# Patient Record
Sex: Male | Born: 1940 | Race: White | Hispanic: No | Marital: Married | Smoking: Former smoker
Health system: Southern US, Community
[De-identification: ages and names within clinical notes are randomized; demographics above are authoritative.]

## PROBLEM LIST (undated history)

## (undated) ENCOUNTER — Encounter: Attending: Pharmacist | Primary: Pharmacist

## (undated) ENCOUNTER — Ambulatory Visit

## (undated) ENCOUNTER — Encounter

## (undated) ENCOUNTER — Encounter: Attending: Cardiovascular Disease | Primary: Cardiovascular Disease

## (undated) ENCOUNTER — Encounter: Attending: Clinical Cardiac Electrophysiology | Primary: Clinical Cardiac Electrophysiology

## (undated) ENCOUNTER — Encounter: Payer: MEDICARE | Attending: Cardiovascular Disease | Primary: Cardiovascular Disease

## (undated) ENCOUNTER — Encounter: Attending: Nurse Practitioner | Primary: Nurse Practitioner

## (undated) ENCOUNTER — Encounter: Attending: Internal Medicine | Primary: Internal Medicine

## (undated) ENCOUNTER — Telehealth

## (undated) ENCOUNTER — Ambulatory Visit: Payer: MEDICARE

## (undated) ENCOUNTER — Ambulatory Visit: Payer: Medicare (Managed Care) | Attending: Nurse Practitioner | Primary: Nurse Practitioner

## (undated) ENCOUNTER — Encounter: Payer: MEDICARE | Attending: Pharmacist | Primary: Pharmacist

## (undated) ENCOUNTER — Ambulatory Visit: Payer: MEDICARE | Attending: Pharmacist | Primary: Pharmacist

## (undated) ENCOUNTER — Inpatient Hospital Stay

## (undated) ENCOUNTER — Non-Acute Institutional Stay: Payer: MEDICARE

## (undated) ENCOUNTER — Ambulatory Visit: Payer: MEDICARE | Attending: Cardiovascular Disease | Primary: Cardiovascular Disease

## (undated) ENCOUNTER — Telehealth: Attending: Pharmacist | Primary: Pharmacist

## (undated) ENCOUNTER — Ambulatory Visit: Payer: Medicare (Managed Care)

## (undated) ENCOUNTER — Telehealth: Attending: Nurse Practitioner | Primary: Nurse Practitioner

## (undated) ENCOUNTER — Telehealth
Attending: Student in an Organized Health Care Education/Training Program | Primary: Student in an Organized Health Care Education/Training Program

## (undated) DIAGNOSIS — M1990 Unspecified osteoarthritis, unspecified site: Secondary | ICD-10-CM

## (undated) DIAGNOSIS — I509 Heart failure, unspecified: Secondary | ICD-10-CM

## (undated) DIAGNOSIS — G930 Cerebral cysts: Secondary | ICD-10-CM

## (undated) DIAGNOSIS — H3530 Unspecified macular degeneration: Secondary | ICD-10-CM

## (undated) DIAGNOSIS — I255 Ischemic cardiomyopathy: Secondary | ICD-10-CM

## (undated) DIAGNOSIS — I2510 Atherosclerotic heart disease of native coronary artery without angina pectoris: Secondary | ICD-10-CM

## (undated) DIAGNOSIS — I499 Cardiac arrhythmia, unspecified: Secondary | ICD-10-CM

## (undated) DIAGNOSIS — E119 Type 2 diabetes mellitus without complications: Secondary | ICD-10-CM

## (undated) DIAGNOSIS — I10 Essential (primary) hypertension: Secondary | ICD-10-CM

## (undated) DIAGNOSIS — E785 Hyperlipidemia, unspecified: Secondary | ICD-10-CM

## (undated) DIAGNOSIS — IMO0001 Reserved for inherently not codable concepts without codable children: Secondary | ICD-10-CM

## (undated) HISTORY — DX: Unspecified macular degeneration: H35.30

## (undated) HISTORY — DX: Type 2 diabetes mellitus without complications: E11.9

## (undated) HISTORY — DX: Essential (primary) hypertension: I10

## (undated) HISTORY — DX: Ischemic cardiomyopathy: I25.5

## (undated) HISTORY — DX: Cerebral cysts: G93.0

## (undated) HISTORY — DX: Hyperlipidemia, unspecified: E78.5

## (undated) HISTORY — PX: EYE SURGERY: SHX253

## (undated) HISTORY — DX: Heart failure, unspecified: I50.9

## (undated) HISTORY — PX: CARDIAC CATHETERIZATION: SHX172

## (undated) HISTORY — PX: OTHER SURGICAL HISTORY: SHX169

## (undated) MED ORDER — SPIRONOLACTONE 25 MG TABLET: Freq: Every day | ORAL | 0 days

## (undated) MED ORDER — CARVEDILOL 25 MG TABLET
Freq: Two times a day (BID) | ORAL | 0.00000 days
Start: ? — End: 2021-05-21

---

## 1981-12-13 DIAGNOSIS — G930 Cerebral cysts: Secondary | ICD-10-CM

## 1981-12-13 HISTORY — DX: Cerebral cysts: G93.0

## 1981-12-13 HISTORY — PX: OTHER SURGICAL HISTORY: SHX169

## 1995-12-14 HISTORY — PX: APPENDECTOMY: SHX54

## 2003-12-14 HISTORY — PX: COLONOSCOPY: SHX174

## 2004-02-14 ENCOUNTER — Other Ambulatory Visit: Payer: Self-pay

## 2004-09-25 ENCOUNTER — Ambulatory Visit: Payer: Self-pay | Admitting: Unknown Physician Specialty

## 2007-03-31 ENCOUNTER — Ambulatory Visit: Payer: Self-pay | Admitting: Ophthalmology

## 2007-04-10 ENCOUNTER — Other Ambulatory Visit: Payer: Self-pay

## 2007-04-10 ENCOUNTER — Ambulatory Visit: Payer: Self-pay | Admitting: Ophthalmology

## 2008-12-02 ENCOUNTER — Ambulatory Visit: Payer: Self-pay | Admitting: General Surgery

## 2009-07-02 ENCOUNTER — Encounter: Payer: Self-pay | Admitting: General Practice

## 2009-07-13 ENCOUNTER — Encounter: Payer: Self-pay | Admitting: General Practice

## 2009-08-13 ENCOUNTER — Encounter: Payer: Self-pay | Admitting: General Practice

## 2011-07-09 ENCOUNTER — Ambulatory Visit: Payer: Self-pay | Admitting: Physician Assistant

## 2011-12-14 HISTORY — PX: OTHER SURGICAL HISTORY: SHX169

## 2011-12-20 ENCOUNTER — Ambulatory Visit: Payer: Self-pay | Admitting: Cardiology

## 2013-05-23 DIAGNOSIS — L089 Local infection of the skin and subcutaneous tissue, unspecified: Secondary | ICD-10-CM | POA: Insufficient documentation

## 2013-07-12 ENCOUNTER — Encounter: Payer: Self-pay | Admitting: *Deleted

## 2013-08-06 ENCOUNTER — Ambulatory Visit (INDEPENDENT_AMBULATORY_CARE_PROVIDER_SITE_OTHER): Payer: Medicare Other | Admitting: General Surgery

## 2013-08-06 ENCOUNTER — Encounter: Payer: Self-pay | Admitting: General Surgery

## 2013-08-06 VITALS — BP 130/78 | HR 77 | Resp 14 | Ht 75.0 in | Wt 230.0 lb

## 2013-08-06 DIAGNOSIS — L723 Sebaceous cyst: Secondary | ICD-10-CM

## 2013-08-06 DIAGNOSIS — L089 Local infection of the skin and subcutaneous tissue, unspecified: Secondary | ICD-10-CM

## 2013-08-06 HISTORY — PX: CYST EXCISION: SHX5701

## 2013-08-06 NOTE — Patient Instructions (Addendum)
Ice pack applied. Written instructions provided to patient regarding wound care.  Patient advised that she will be contacted by phone when the pathology report is available.  Follow up appointment has been scheduled with the nurse in 7-10 days.

## 2013-08-06 NOTE — Progress Notes (Signed)
Patient ID: Daniel Luna, male   DOB: 12/10/1941, 72 y.o.   MRN: [id number]  Chief Complaint  Patient presents with  . Cyst    HPI Daniel Luna is a 72 y.o. male  Here for evaluation of cyst on back and neck. He has had these for about 5 years. Recently the cyst on his left back got infected and was draining. He was placed on antibiotics for this, and underwent an I&D x2. Area has been draining intermittently since the drainage procedure in June 2014. The cyst on his neck his doctor advised he have removed, but the patient has never had any symptoms from this area, nor does he report any increase in size.   HPI  Past Medical History  Diagnosis Date  . Hypertension   . Hyperlipidemia   . Diabetes mellitus without complication   . Heart failure   . Cyst of brain 1983    Past Surgical History  Procedure Laterality Date  . Appendectomy  1997  . Cyst removal brain  1983    benign dermatoid cyst  . Colonoscopy  2005    4 polyps  . Eye surgery Bilateral 2008, 2012  . Stents  2013    3 placed    Family History  Problem Relation Age of Onset  . Throat cancer Mother     Social History History  Substance Use Topics  . Smoking status: Former Smoker    Types: Cigarettes  . Smokeless tobacco: Never Used  . Alcohol Use: Yes    Allergies  Allergen Reactions  . Iodinated Diagnostic Agents Hives    Current Outpatient Prescriptions  Medication Sig Dispense Refill  . ACCU-CHEK FASTCLIX LANCETS MISC       . ACCU-CHEK SMARTVIEW test strip       . aspirin 81 MG tablet Take 81 mg by mouth daily.      . carvedilol (COREG) 12.5 MG tablet Take 12.5 mg by mouth 2 (two) times daily with a meal.      . furosemide (LASIX) 20 MG tablet Take 20 mg by mouth.      Marland Kitchen lisinopril (PRINIVIL,ZESTRIL) 40 MG tablet Take 40 mg by mouth daily.      . metFORMIN (GLUCOPHAGE) 1000 MG tablet Take 1,000 mg by mouth 2 (two) times daily with a meal.      . potassium chloride SA (K-DUR,KLOR-CON) 20 MEQ tablet Take  1 tablet by mouth daily.      . prasugrel (EFFIENT) 10 MG TABS tablet Take 10 mg by mouth daily.      . simvastatin (ZOCOR) 80 MG tablet Take 80 mg by mouth at bedtime.       No current facility-administered medications for this visit.    Review of Systems Review of Systems  Constitutional: Negative.   Respiratory: Negative.   Cardiovascular: Negative.     Blood pressure 130/78, pulse 77, resp. rate 14, height 6\' 3"  (1.905 m), weight 230 lb (104.327 kg).  Physical Exam Physical Exam  Constitutional: He is oriented to person, place, and time. He appears well-developed and well-nourished.  Neurological: He is alert and oriented to person, place, and time.  Skin:  Left posterior neck 3.5 cm sebaceous cyst non inflamed. On the left back there is a 1 cm cyst and below it a 1.5 cm area of redness.     Data Reviewed Primary care physician Notes.  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     Plan  It was elected to proceed with excision of the residual cyst wall to minimize ongoing inflammation. 12 cc of 0.5% Xylocaine with 0.25% Marcaine with 1:200,000 units of epinephrine was was well tolerated. DuraPrep was applied to the skin. A vertical incision was made to include the central pore of the sebaceous cyst and the majority of the old incision site. This was completed through an elliptical incision. All the cyst wall was excised. The deep tissue was approximated with a 3-0 Vicryl figure-of-eight suture. The skin was approximated with interrupted 4-0 nylon sutures. A dry dressing with Telfa and Tegaderm was applied.  The patient will return in one week for suture removal to nurse.  The sebaceous cyst of the back is presently asymptomatic. Considering his ongoing anticoagulation the size of lesion would defer intervention unless it increases in size or become symptomatic.       [name] 08/06/2013, 9:21 PM

## 2013-08-09 LAB — PATHOLOGY

## 2013-08-15 ENCOUNTER — Ambulatory Visit (INDEPENDENT_AMBULATORY_CARE_PROVIDER_SITE_OTHER): Payer: Medicare Other | Admitting: General Surgery

## 2013-08-15 ENCOUNTER — Encounter: Payer: Self-pay | Admitting: General Surgery

## 2013-08-15 VITALS — BP 130/64 | Ht 75.0 in | Wt 226.0 lb

## 2013-08-15 DIAGNOSIS — L723 Sebaceous cyst: Secondary | ICD-10-CM

## 2013-08-15 NOTE — Patient Instructions (Addendum)
Patient to return as needed. 

## 2013-08-15 NOTE — Progress Notes (Signed)
Patient ID: Daniel Luna, male   DOB: [birth year], 72 y.o.   MRN: [id number]  Chief Complaint  Patient presents with  . Routine Post Op    HPI Daniel Luna is a 72 y.o. male.  Here today for post op excision posterior back mass done 08-06-13.  States he is doing well. HPI  Past Medical History  Diagnosis Date  . Hypertension   . Hyperlipidemia   . Diabetes mellitus without complication   . Heart failure   . Cyst of brain 1983    Past Surgical History  Procedure Laterality Date  . Appendectomy  1997  . Cyst removal brain  1983    benign dermatoid cyst  . Colonoscopy  2005    4 polyps  . Eye surgery Bilateral 2008, 2012  . Stents  2013    3 placed  . Cyst excision  08-06-13    back    Family History  Problem Relation Age of Onset  . Throat cancer Mother     Social History History  Substance Use Topics  . Smoking status: Former Smoker    Types: Cigarettes  . Smokeless tobacco: Never Used  . Alcohol Use: Yes    Allergies  Allergen Reactions  . Iodinated Diagnostic Agents Hives    Current Outpatient Prescriptions  Medication Sig Dispense Refill  . ACCU-CHEK FASTCLIX LANCETS MISC       . ACCU-CHEK SMARTVIEW test strip       . aspirin 81 MG tablet Take 81 mg by mouth daily.      . carvedilol (COREG) 12.5 MG tablet Take 12.5 mg by mouth 2 (two) times daily with a meal.      . furosemide (LASIX) 20 MG tablet Take 20 mg by mouth.      Marland Kitchen lisinopril (PRINIVIL,ZESTRIL) 40 MG tablet Take 40 mg by mouth daily.      . metFORMIN (GLUCOPHAGE) 1000 MG tablet Take 1,000 mg by mouth 2 (two) times daily with a meal.      . potassium chloride SA (K-DUR,KLOR-CON) 20 MEQ tablet Take 1 tablet by mouth daily.      . prasugrel (EFFIENT) 10 MG TABS tablet Take 10 mg by mouth daily.      . simvastatin (ZOCOR) 80 MG tablet Take 80 mg by mouth at bedtime.       No current facility-administered medications for this visit.    Review of Systems Review of Systems  Constitutional: Negative.    Respiratory: Negative.   Cardiovascular: Negative.     Blood pressure 130/64, height 6\' 3"  (1.905 m), weight 226 lb (102.513 kg).  Physical Exam Physical Exam The excision site shows a 2 cm area of erythema. No fluctuance. No drainage. Sutures were removed without incident.  The noninflamed sebaceous cyst of the back of the neck on the left side is unchanged. Data Reviewed Pathology showed an inflamed sebaceous cyst. No malignancy.  Assessment    Doing well status post excision of persistent sebaceous cyst on the left back.    Plan    The patient was encouraged to call if he appreciate any discomfort or change in the left neck lesion. Follow up otherwise been an as-needed basis       [name] 08/17/2013, 7:40 AM

## 2013-12-20 ENCOUNTER — Ambulatory Visit: Payer: Medicare Other | Admitting: General Surgery

## 2013-12-25 ENCOUNTER — Encounter: Payer: Self-pay | Admitting: General Surgery

## 2013-12-25 ENCOUNTER — Other Ambulatory Visit: Payer: Self-pay | Admitting: General Surgery

## 2013-12-25 ENCOUNTER — Ambulatory Visit (INDEPENDENT_AMBULATORY_CARE_PROVIDER_SITE_OTHER): Payer: Medicare Other | Admitting: General Surgery

## 2013-12-25 VITALS — BP 142/72 | HR 82 | Resp 13 | Ht 75.0 in | Wt 231.0 lb

## 2013-12-25 DIAGNOSIS — Z1211 Encounter for screening for malignant neoplasm of colon: Secondary | ICD-10-CM

## 2013-12-25 MED ORDER — POLYETHYLENE GLYCOL 3350 17 GM/SCOOP PO POWD: ORAL | Status: DC

## 2013-12-25 NOTE — Patient Instructions (Addendum)
Colonoscopy A colonoscopy is an exam to look at the entire large intestine (colon). This exam can help find problems such as tumors, polyps, inflammation, and areas of bleeding. The exam takes about 1 hour.  LET [facility] CARE PROVIDER KNOW ABOUT:   Any allergies you have.  All medicines you are taking, including vitamins, herbs, eye drops, creams, and over-the-counter medicines.  Previous problems you or members of your family have had with the use of anesthetics.  Any blood disorders you have.  Previous surgeries you have had.  Medical conditions you have. RISKS AND COMPLICATIONS  Generally, this is a safe procedure. However, as with any procedure, complications can occur. Possible complications include:  Bleeding.  Tearing or rupture of the colon wall.  Reaction to medicines given during the exam.  Infection (rare). BEFORE THE PROCEDURE   Ask your health care provider about changing or stopping your regular medicines.  You may be prescribed an oral bowel prep. This involves drinking a large amount of medicated liquid, starting the day before your procedure. The liquid will cause you to have multiple loose stools until your stool is almost clear or light green. This cleans out your colon in preparation for the procedure.  Do not eat or drink anything else once you have started the bowel prep, unless your health care provider tells you it is safe to do so.  Arrange for someone to drive you home after the procedure. PROCEDURE   You will be given medicine to help you relax (sedative).  You will lie on your side with your knees bent.  A long, flexible tube with a light and camera on the end (colonoscope) will be inserted through the rectum and into the colon. The camera sends video back to a computer screen as it moves through the colon. The colonoscope also releases carbon dioxide gas to inflate the colon. This helps your health care provider see the area better.  During  the exam, your health care provider may take a small tissue sample (biopsy) to be examined under a microscope if any abnormalities are found.  The exam is finished when the entire colon has been viewed. AFTER THE PROCEDURE   Do not drive for 24 hours after the exam.  You may have a small amount of blood in your stool.  You may pass moderate amounts of gas and have mild abdominal cramping or bloating. This is caused by the gas used to inflate your colon during the exam.  Ask when your test results will be ready and how you will get your results. Make sure you get your test results. Document Released: 11/26/2000 Document Revised: 09/19/2013 Document Reviewed: 08/06/2013 [facility] Patient Information 2014 [place].  Patient has been scheduled for a colonoscopy on 01-30-14 at [facility]. This patient has been asked to discontinue Effient 7 days prior to procedure. He has also been asked to hold metformin day of colonoscopy prep and procedure.

## 2013-12-25 NOTE — Progress Notes (Addendum)
Patient ID: Daniel Luna, male   DOB: 22-Jul-1941, 73 y.o.   MRN: [id number]  Chief Complaint  Patient presents with  . Colonoscopy    HPI Daniel Luna is a 73 y.o. male.  Here today to discuss having his 5 year follow up colonoscopy, last done 12-02-08.  Denies any gastrointestinal issues. Bowels move regularly.  The patient reports a stool Hemoccult sample obtained in 2014 was negative at Dr. Doy Hutching office. HPI  Past Medical History  Diagnosis Date  . Hypertension   . Hyperlipidemia   . Diabetes mellitus without complication   . Heart failure   . Cyst of brain 1983  . Macular degeneration     Past Surgical History  Procedure Laterality Date  . Appendectomy  1997  . Cyst removal brain  1983    benign dermatoid cyst  . Colonoscopy  2005    4 polyps  . Eye surgery Bilateral 2008, 2012  . Stents  2013    3 placed  . Cyst excision  08-06-13    back    Family History  Problem Relation Age of Onset  . Throat cancer Mother     Social History History  Substance Use Topics  . Smoking status: Former Smoker    Types: Cigarettes  . Smokeless tobacco: Never Used  . Alcohol Use: Yes    Allergies  Allergen Reactions  . Iodinated Diagnostic Agents Hives    Current Outpatient Prescriptions  Medication Sig Dispense Refill  . aspirin 81 MG tablet Take 81 mg by mouth daily.      . carvedilol (COREG) 12.5 MG tablet Take 12.5 mg by mouth 2 (two) times daily with a meal.      . furosemide (LASIX) 20 MG tablet Take 20 mg by mouth.      Marland Kitchen lisinopril (PRINIVIL,ZESTRIL) 40 MG tablet Take 40 mg by mouth daily.      . metFORMIN (GLUCOPHAGE) 1000 MG tablet Take 1,000 mg by mouth 2 (two) times daily with a meal.      . Multiple Vitamins-Minerals (VISION VITAMINS PO) Take 1 tablet by mouth 2 (two) times daily.      . potassium chloride SA (K-DUR,KLOR-CON) 20 MEQ tablet Take 1 tablet by mouth daily.      . prasugrel (EFFIENT) 10 MG TABS tablet Take 10 mg by mouth daily.      . simvastatin  (ZOCOR) 80 MG tablet Take 80 mg by mouth at bedtime.      Marland Kitchen ACCU-CHEK FASTCLIX LANCETS MISC       . ACCU-CHEK SMARTVIEW test strip       . polyethylene glycol powder (GLYCOLAX/MIRALAX) powder 255 grams one bottle for colonoscopy prep  255 g  0   No current facility-administered medications for this visit.    Review of Systems Review of Systems  Constitutional: Negative.   Respiratory: Negative.   Cardiovascular: Negative.   Gastrointestinal: Negative.     Blood pressure 142/72, pulse 82, resp. rate 13, height 6\' 3"  (1.905 m), weight 231 lb (104.781 kg).  Physical Exam Physical Exam  Constitutional: He is oriented to person, place, and time. He appears well-developed and well-nourished.  Neck: Neck supple.  Cardiovascular: Normal rate, regular rhythm and normal heart sounds.   Pulmonary/Chest: Effort normal and breath sounds normal.  Lymphadenopathy:    He has no cervical adenopathy.  Neurological: He is alert and oriented to person, place, and time.  Skin:  6 mm thickening superior to the site of old cyst excision In  the left upper back.    Data Reviewed The October 2005 colonoscopy showed small polyps, adenomatous, in the cecum and transverse colon.  Followup examination completed in December 2009 was negative with the exception of diverticulosis. The plan was for a 5 year followup.  Assessment    Previous colonic polyps. No active symptoms.     Plan     The patient has been placed Effient and since his last visit. This is for atrial fibrillation. The indication to stop this medication prior to surgery and the small but real risk of embolic events during this time off therapy was reviewed. The patient has not had a recent stent, past history of congestive heart failure or previous strokes/TIA. Bridging therapy is not felt to be needed.     Patient has been scheduled for a colonoscopy on 01-30-14 at [facility]. This patient has been asked to discontinue Effient 7 days prior to  procedure. He has also been asked to hold metformin day of colonoscopy prep and procedure.    Robert Bellow 12/25/2013, 8:02 PM

## 2014-01-22 ENCOUNTER — Telehealth: Payer: Self-pay | Admitting: *Deleted

## 2014-01-22 NOTE — Telephone Encounter (Signed)
He called asking about [facility], aware that it was sent to [facility] if there is a problem he will call us back. His SS # was corrected in the computer so he would be able to access his MyChart account.

## 2014-01-25 ENCOUNTER — Telehealth: Payer: Self-pay | Admitting: *Deleted

## 2014-01-25 NOTE — Telephone Encounter (Signed)
Patient's wife reports that patient's medications have not changed since last office visit. She reports that he has discontinued his Effient. We will proceed with colonoscopy that is scheduled at [facility] for 01-30-14. Wife was instructed to call the office should they have further questions.

## 2014-01-30 ENCOUNTER — Ambulatory Visit: Payer: Self-pay | Admitting: General Surgery

## 2014-01-30 DIAGNOSIS — D129 Benign neoplasm of anus and anal canal: Secondary | ICD-10-CM

## 2014-01-30 DIAGNOSIS — Z1211 Encounter for screening for malignant neoplasm of colon: Secondary | ICD-10-CM

## 2014-01-30 DIAGNOSIS — D128 Benign neoplasm of rectum: Secondary | ICD-10-CM

## 2014-01-31 LAB — PATHOLOGY REPORT

## 2014-02-01 ENCOUNTER — Encounter: Payer: Self-pay | Admitting: General Surgery

## 2014-02-04 ENCOUNTER — Telehealth: Payer: Self-pay

## 2014-02-04 NOTE — Telephone Encounter (Signed)
Notified patient as instructed, patient pleased. Discussed follow-up appointments, patient agrees. Patient placed in recalls for 10 year follow up.

## 2014-02-04 NOTE — Telephone Encounter (Signed)
Message copied by Lesly Rubenstein on Mon Feb 04, 2014  9:31 AM ------      Message from: [place], Forest Gleason      Created: Sun Feb 03, 2014  1:46 PM       Please notify the patient with the biopsy completed at the time of his colonoscopy was entirely benign. To get the opportunity for a final exam in 10 years, earlier if any problems develop.      ----- Message -----         From: Verlene Mayer, CMA         Sent: 02/01/2014   3:19 PM           To: Robert Bellow, MD                   ------

## 2014-02-06 ENCOUNTER — Encounter: Payer: Self-pay | Admitting: General Surgery

## 2014-03-05 ENCOUNTER — Encounter: Payer: Self-pay | Admitting: General Surgery

## 2015-02-14 DIAGNOSIS — M170 Bilateral primary osteoarthritis of knee: Secondary | ICD-10-CM | POA: Insufficient documentation

## 2015-05-07 ENCOUNTER — Encounter
Admission: RE | Admit: 2015-05-07 | Discharge: 2015-05-07 | Disposition: A | Discharge status: Home / Self Care | Payer: Medicare Other | Source: Ambulatory Visit | Attending: Orthopedic Surgery | Admitting: Orthopedic Surgery

## 2015-05-07 DIAGNOSIS — E119 Type 2 diabetes mellitus without complications: Secondary | ICD-10-CM | POA: Diagnosis not present

## 2015-05-07 DIAGNOSIS — Z79899 Other long term (current) drug therapy: Secondary | ICD-10-CM | POA: Diagnosis not present

## 2015-05-07 DIAGNOSIS — M19071 Primary osteoarthritis, right ankle and foot: Secondary | ICD-10-CM | POA: Diagnosis not present

## 2015-05-07 DIAGNOSIS — Z01810 Encounter for preprocedural cardiovascular examination: Secondary | ICD-10-CM | POA: Insufficient documentation

## 2015-05-07 DIAGNOSIS — Z01812 Encounter for preprocedural laboratory examination: Secondary | ICD-10-CM | POA: Insufficient documentation

## 2015-05-07 DIAGNOSIS — E785 Hyperlipidemia, unspecified: Secondary | ICD-10-CM | POA: Insufficient documentation

## 2015-05-07 DIAGNOSIS — I10 Essential (primary) hypertension: Secondary | ICD-10-CM | POA: Insufficient documentation

## 2015-05-07 HISTORY — DX: Unspecified osteoarthritis, unspecified site: M19.90

## 2015-05-07 HISTORY — DX: Cardiac arrhythmia, unspecified: I49.9

## 2015-05-07 HISTORY — DX: Reserved for inherently not codable concepts without codable children: IMO0001

## 2015-05-07 HISTORY — DX: Heart failure, unspecified: I50.9

## 2015-05-07 HISTORY — DX: Atherosclerotic heart disease of native coronary artery without angina pectoris: I25.10

## 2015-05-07 LAB — BASIC METABOLIC PANEL
Anion gap: 8 (ref 5–15)
BUN: 19 mg/dL (ref 6–20)
CO2: 29 mmol/L (ref 22–32)
Calcium: 10 mg/dL (ref 8.9–10.3)
Chloride: 102 mmol/L (ref 101–111)
Creatinine, Ser: 1.13 mg/dL (ref 0.61–1.24)
GFR calc non Af Amer: >60 mL/min (ref >60)
GLUCOSE: 94 mg/dL (ref 65–99)
POTASSIUM: 4.4 mmol/L (ref 3.5–5.1)
Sodium: 139 mmol/L (ref 135–145)

## 2015-05-07 LAB — DIFFERENTIAL
Basophils Absolute: 0 10*3/uL (ref 0–0.1)
Basophils Relative: 1 %
Eosinophils Absolute: 0.2 10*3/uL (ref 0–0.7)
Eosinophils Relative: 3 %
LYMPHS ABS: 1.2 10*3/uL (ref 1.0–3.6)
LYMPHS PCT: 19 %
MONO ABS: 0.6 10*3/uL (ref 0.2–1.0)
MONOS PCT: 10 %
Neutro Abs: 4.4 10*3/uL (ref 1.4–6.5)
Neutrophils Relative %: 67 %

## 2015-05-07 LAB — CBC
HCT: 41.6 % (ref 40.0–52.0)
Hemoglobin: 14.1 g/dL (ref 13.0–18.0)
MCH: 32.7 pg (ref 26.0–34.0)
MCHC: 33.9 g/dL (ref 32.0–36.0)
MCV: 96.4 fL (ref 80.0–100.0)
PLATELETS: 161 10*3/uL (ref 150–440)
RBC: 4.31 MIL/uL — ABNORMAL LOW (ref 4.40–5.90)
RDW: 13 % (ref 11.5–14.5)
WBC: 6.5 10*3/uL (ref 3.8–10.6)

## 2015-05-07 LAB — PROTIME-INR
INR: 0.99
PROTHROMBIN TIME: 13.3 s (ref 11.4–15.0)

## 2015-05-07 LAB — TYPE AND SCREEN
ABO/RH(D): A POS
Antibody Screen: NEGATIVE

## 2015-05-07 LAB — URINALYSIS COMPLETE WITH MICROSCOPIC (ARMC ONLY)
BACTERIA UA: NONE SEEN
BILIRUBIN URINE: NEGATIVE
Glucose, UA: NEGATIVE mg/dL
Hgb urine dipstick: NEGATIVE
KETONES UR: NEGATIVE mg/dL
Leukocytes, UA: NEGATIVE
Nitrite: NEGATIVE
PH: 5 (ref 5.0–8.0)
Protein, ur: NEGATIVE mg/dL
Specific Gravity, Urine: 1.006 (ref 1.005–1.030)
Squamous Epithelial / LPF: NONE SEEN

## 2015-05-07 LAB — SURGICAL PCR SCREEN
MRSA, PCR: NEGATIVE
Staphylococcus aureus: NEGATIVE

## 2015-05-07 LAB — ABO/RH: ABO/RH(D): A POS

## 2015-05-07 LAB — SEDIMENTATION RATE: Sed Rate: 25 mm/hr — ABNORMAL HIGH (ref 0–20)

## 2015-05-07 LAB — HEMOGLOBIN A1C: Hgb A1c MFr Bld: 5.8 % (ref 4.0–6.0)

## 2015-05-07 LAB — APTT: APTT: 33 s (ref 24–36)

## 2015-05-07 NOTE — Patient Instructions (Addendum)
c  Your procedure is scheduled on: 05/21/15 Report to Day Surgery. To find out your arrival time please call [phone] between 1PM - 3PM on 05/20/15.  Remember: Instructions that are not followed completely may result in serious medical risk, up to and including death, or upon the discretion of your surgeon and anesthesiologist your surgery may need to be rescheduled.    ____ 1. Do not eat food or drink liquids after midnight. No gum chewing or hard candies.     ____ 2. No Alcohol for 24 hours before or after surgery.   ____ 3. Bring all medications with you on the day of surgery if instructed.    ____ 4. Notify your doctor if there is any change in your medical condition     (cold, fever, infections).     Do not wear jewelry, make-up, hairpins, clips or nail polish.  Do not wear lotions, powders, or perfumes. You may wear deodorant.  Do not shave 48 hours prior to surgery. Men may shave face and neck.  Do not bring valuables to the hospital.    [facility] is not responsible for any belongings or valuables.               Contacts, dentures or bridgework may not be worn into surgery.  Leave your suitcase in the car. After surgery it may be brought to your room.  For patients admitted to the hospital, discharge time is determined by your                treatment team.   Patients discharged the day of surgery will not be allowed to drive home.   Please read over the following fact sheets that you were given:   Surgical Site Infection Prevention   ____ Take these medicines the morning of surgery with A SIP OF WATER:    1. carvedilol  2. lisinopril  3.   4.  5.  6.  ____ Fleet Enema (as directed)   __x__ Use CHG Soap as directed  ____ Use inhalers on the day of surgery  _x___ Stop metformin 2 days prior to surgery    ____ Take 1/2 of usual insulin dose the night before surgery and none on the morning of surgery.   _x___ Stop Coumadin/Plavix/aspirin on stop aspirin 1  week preop  ____ Stop Anti-inflammatories on    _x___ Stop supplements until after surgery.  preservision now  ____ Bring C-Pap to the hospital.   Check with Dr Doy Hutching about stopping Effient

## 2015-05-09 LAB — URINE CULTURE
Culture: NO GROWTH
Special Requests: NORMAL

## 2015-05-09 NOTE — OR Nursing (Signed)
CLEARANCE REQUESTED BY DR [place] FAXED TO DR Marry Guan.

## 2015-05-15 NOTE — OR Nursing (Signed)
Cleared by Dr Oleta Mouse cardiologist 05/14/15

## 2015-05-21 ENCOUNTER — Inpatient Hospital Stay: Payer: Medicare Other | Admitting: Anesthesiology

## 2015-05-21 ENCOUNTER — Encounter
Admission: RE | Disposition: A | Discharge status: Home Health | Payer: Medicare Other | Source: Ambulatory Visit | Attending: Orthopedic Surgery

## 2015-05-21 ENCOUNTER — Inpatient Hospital Stay: Payer: Medicare Other

## 2015-05-21 ENCOUNTER — Inpatient Hospital Stay
Admission: RE | Admit: 2015-05-21 | Discharge: 2015-05-24 | DRG: 470 | Disposition: A | Discharge status: Home Health | Payer: Medicare Other | Source: Ambulatory Visit | Attending: Orthopedic Surgery | Admitting: Orthopedic Surgery

## 2015-05-21 ENCOUNTER — Encounter: Payer: Self-pay | Admitting: *Deleted

## 2015-05-21 DIAGNOSIS — Z96651 Presence of right artificial knee joint: Secondary | ICD-10-CM

## 2015-05-21 DIAGNOSIS — H3530 Unspecified macular degeneration: Secondary | ICD-10-CM | POA: Diagnosis present

## 2015-05-21 DIAGNOSIS — M1711 Unilateral primary osteoarthritis, right knee: Principal | ICD-10-CM | POA: Diagnosis present

## 2015-05-21 DIAGNOSIS — Z79899 Other long term (current) drug therapy: Secondary | ICD-10-CM | POA: Diagnosis not present

## 2015-05-21 DIAGNOSIS — E119 Type 2 diabetes mellitus without complications: Secondary | ICD-10-CM | POA: Diagnosis present

## 2015-05-21 DIAGNOSIS — I509 Heart failure, unspecified: Secondary | ICD-10-CM | POA: Diagnosis present

## 2015-05-21 DIAGNOSIS — Z96659 Presence of unspecified artificial knee joint: Secondary | ICD-10-CM

## 2015-05-21 DIAGNOSIS — Z7982 Long term (current) use of aspirin: Secondary | ICD-10-CM

## 2015-05-21 DIAGNOSIS — E785 Hyperlipidemia, unspecified: Secondary | ICD-10-CM | POA: Diagnosis present

## 2015-05-21 DIAGNOSIS — I10 Essential (primary) hypertension: Secondary | ICD-10-CM | POA: Diagnosis present

## 2015-05-21 DIAGNOSIS — I2510 Atherosclerotic heart disease of native coronary artery without angina pectoris: Secondary | ICD-10-CM | POA: Diagnosis present

## 2015-05-21 DIAGNOSIS — Z955 Presence of coronary angioplasty implant and graft: Secondary | ICD-10-CM

## 2015-05-21 DIAGNOSIS — I499 Cardiac arrhythmia, unspecified: Secondary | ICD-10-CM | POA: Diagnosis present

## 2015-05-21 HISTORY — PX: TOTAL KNEE ARTHROPLASTY: SHX125

## 2015-05-21 LAB — GLUCOSE, CAPILLARY
GLUCOSE-CAPILLARY: 137 mg/dL — AB (ref 65–99)
GLUCOSE-CAPILLARY: 123 mg/dL — AB (ref 65–99)
GLUCOSE-CAPILLARY: 117 mg/dL — AB (ref 65–99)
Glucose-Capillary: 132 mg/dL — ABNORMAL HIGH (ref 65–99)

## 2015-05-21 SURGERY — ARTHROPLASTY, KNEE, TOTAL
Anesthesia: Spinal | Laterality: Right | Wound class: Clean

## 2015-05-21 MED ORDER — ONDANSETRON HCL 4 MG/2ML IJ SOLN: 4.0000 mg | Freq: Once | INTRAMUSCULAR | Status: DC | PRN

## 2015-05-21 MED ORDER — NEOMYCIN-POLYMYXIN B GU 40-200000 IR SOLN
Status: DC | PRN
  Administered 2015-05-21: 3012 mL

## 2015-05-21 MED ORDER — CHLORHEXIDINE GLUCONATE 4 % EX LIQD: 60.0000 mL | Freq: Once | CUTANEOUS | Status: DC

## 2015-05-21 MED ORDER — NEOMYCIN-POLYMYXIN B GU 40-200000 IR SOLN
Status: AC
  Filled 2015-05-21: qty 20

## 2015-05-21 MED ORDER — INSULIN ASPART 100 UNIT/ML ~~LOC~~ SOLN
0.0000 [IU] | Freq: Three times a day (TID) | SUBCUTANEOUS | Status: DC
  Administered 2015-05-21: 2 [IU] via SUBCUTANEOUS
  Administered 2015-05-22: 3 [IU] via SUBCUTANEOUS
  Administered 2015-05-22: 2 [IU] via SUBCUTANEOUS
  Administered 2015-05-23: 3 [IU] via SUBCUTANEOUS
  Administered 2015-05-23 – 2015-05-24 (×3): 2 [IU] via SUBCUTANEOUS
  Filled 2015-05-21 (×3): qty 2
  Filled 2015-05-21: qty 3
  Filled 2015-05-21 (×2): qty 2
  Filled 2015-05-21: qty 3

## 2015-05-21 MED ORDER — FENTANYL CITRATE (PF) 100 MCG/2ML IJ SOLN
INTRAMUSCULAR | Status: DC | PRN
  Administered 2015-05-21 (×2): 50 ug via INTRAVENOUS

## 2015-05-21 MED ORDER — I-VITE PROTECT PO TABS
1.0000 | ORAL_TABLET | Freq: Two times a day (BID) | ORAL | Status: DC
  Administered 2015-05-21 – 2015-05-22 (×3): 1 via ORAL
  Filled 2015-05-21 (×10): qty 1

## 2015-05-21 MED ORDER — ACETAMINOPHEN 10 MG/ML IV SOLN
1000.0000 mg | Freq: Four times a day (QID) | INTRAVENOUS | Status: AC
  Administered 2015-05-21 – 2015-05-22 (×4): 1000 mg via INTRAVENOUS
  Filled 2015-05-21 (×4): qty 100

## 2015-05-21 MED ORDER — PROPOFOL INFUSION 10 MG/ML OPTIME
INTRAVENOUS | Status: DC | PRN
  Administered 2015-05-21: 65 ug/kg/min via INTRAVENOUS

## 2015-05-21 MED ORDER — ATORVASTATIN CALCIUM 20 MG PO TABS
40.0000 mg | ORAL_TABLET | Freq: Every day | ORAL | Status: DC
  Administered 2015-05-21 – 2015-05-23 (×3): 40 mg via ORAL
  Filled 2015-05-21 (×3): qty 2

## 2015-05-21 MED ORDER — CEFAZOLIN SODIUM-DEXTROSE 2-3 GM-% IV SOLR
2.0000 g | Freq: Four times a day (QID) | INTRAVENOUS | Status: AC
  Administered 2015-05-21 – 2015-05-22 (×4): 2 g via INTRAVENOUS
  Filled 2015-05-21 (×4): qty 50

## 2015-05-21 MED ORDER — BUPIVACAINE-EPINEPHRINE (PF) 0.5% -1:200000 IJ SOLN
INTRAMUSCULAR | Status: DC | PRN
  Administered 2015-05-21: 3 mL

## 2015-05-21 MED ORDER — FLEET ENEMA 7-19 GM/118ML RE ENEM: 1.0000 | ENEMA | Freq: Once | RECTAL | Status: AC | PRN

## 2015-05-21 MED ORDER — ACETAMINOPHEN 10 MG/ML IV SOLN
INTRAVENOUS | Status: AC
Start: 2015-05-21 — End: 2015-05-21
  Filled 2015-05-21: qty 100

## 2015-05-21 MED ORDER — BUPIVACAINE-EPINEPHRINE (PF) 0.25% -1:200000 IJ SOLN
INTRAMUSCULAR | Status: AC
  Filled 2015-05-21: qty 30

## 2015-05-21 MED ORDER — LIDOCAINE HCL (CARDIAC) 20 MG/ML IV SOLN
INTRAVENOUS | Status: DC | PRN
  Administered 2015-05-21: 30 mg via INTRAVENOUS

## 2015-05-21 MED ORDER — MIDAZOLAM HCL 5 MG/5ML IJ SOLN
INTRAMUSCULAR | Status: DC | PRN
  Administered 2015-05-21 (×3): 1 mg via INTRAVENOUS

## 2015-05-21 MED ORDER — FERROUS SULFATE 325 (65 FE) MG PO TABS
325.0000 mg | ORAL_TABLET | Freq: Two times a day (BID) | ORAL | Status: DC
  Administered 2015-05-21 – 2015-05-24 (×6): 325 mg via ORAL
  Filled 2015-05-21 (×6): qty 1

## 2015-05-21 MED ORDER — POTASSIUM CHLORIDE CRYS ER 10 MEQ PO TBCR
10.0000 meq | EXTENDED_RELEASE_TABLET | Freq: Every day | ORAL | Status: DC
  Administered 2015-05-21 – 2015-05-24 (×4): 10 meq via ORAL
  Filled 2015-05-21 (×4): qty 1

## 2015-05-21 MED ORDER — PHENOL 1.4 % MT LIQD
1.0000 | OROMUCOSAL | Status: DC | PRN
  Filled 2015-05-21: qty 177

## 2015-05-21 MED ORDER — CARVEDILOL 12.5 MG PO TABS
12.5000 mg | ORAL_TABLET | Freq: Two times a day (BID) | ORAL | Status: DC
  Administered 2015-05-21 – 2015-05-24 (×6): 12.5 mg via ORAL
  Filled 2015-05-21 (×6): qty 1

## 2015-05-21 MED ORDER — BISACODYL 10 MG RE SUPP: 10.0000 mg | Freq: Every day | RECTAL | Status: DC | PRN

## 2015-05-21 MED ORDER — VISION VITAMINS PO TABS: 1.0000 | ORAL_TABLET | Freq: Two times a day (BID) | ORAL | Status: DC

## 2015-05-21 MED ORDER — ONDANSETRON HCL 4 MG PO TABS
4.0000 mg | ORAL_TABLET | Freq: Four times a day (QID) | ORAL | Status: DC | PRN
Start: 2015-05-21 — End: 2015-05-24

## 2015-05-21 MED ORDER — ONDANSETRON HCL 4 MG/2ML IJ SOLN
4.0000 mg | Freq: Four times a day (QID) | INTRAMUSCULAR | Status: DC | PRN
  Administered 2015-05-22: 4 mg via INTRAVENOUS
  Filled 2015-05-21: qty 2

## 2015-05-21 MED ORDER — MAGNESIUM HYDROXIDE 400 MG/5ML PO SUSP
30.0000 mL | Freq: Every day | ORAL | Status: DC | PRN
  Administered 2015-05-22: 30 mL via ORAL
  Filled 2015-05-21: qty 30

## 2015-05-21 MED ORDER — SODIUM CHLORIDE 0.9 % IV SOLN
INTRAVENOUS | Status: DC
  Administered 2015-05-21 – 2015-05-22 (×4): via INTRAVENOUS

## 2015-05-21 MED ORDER — LISINOPRIL 20 MG PO TABS
40.0000 mg | ORAL_TABLET | Freq: Every day | ORAL | Status: DC
Start: 2015-05-21 — End: 2015-05-24
  Administered 2015-05-22 – 2015-05-24 (×3): 40 mg via ORAL
  Filled 2015-05-21 (×3): qty 2

## 2015-05-21 MED ORDER — MENTHOL 3 MG MT LOZG
1.0000 | LOZENGE | OROMUCOSAL | Status: DC | PRN
  Filled 2015-05-21: qty 9

## 2015-05-21 MED ORDER — SENNOSIDES-DOCUSATE SODIUM 8.6-50 MG PO TABS
1.0000 | ORAL_TABLET | Freq: Two times a day (BID) | ORAL | Status: DC
  Administered 2015-05-21 – 2015-05-24 (×7): 1 via ORAL
  Filled 2015-05-21 (×7): qty 1

## 2015-05-21 MED ORDER — BUPIVACAINE HCL (PF) 0.25 % IJ SOLN
INTRAMUSCULAR | Status: AC
  Filled 2015-05-21: qty 30

## 2015-05-21 MED ORDER — ACETAMINOPHEN 325 MG PO TABS: 650.0000 mg | ORAL_TABLET | Freq: Four times a day (QID) | ORAL | Status: DC | PRN

## 2015-05-21 MED ORDER — ACETAMINOPHEN 650 MG RE SUPP: 650.0000 mg | Freq: Four times a day (QID) | RECTAL | Status: DC | PRN

## 2015-05-21 MED ORDER — CEFAZOLIN SODIUM-DEXTROSE 2-3 GM-% IV SOLR
2.0000 g | INTRAVENOUS | Status: AC
  Administered 2015-05-21: 2 g via INTRAVENOUS

## 2015-05-21 MED ORDER — PHENYLEPHRINE HCL 10 MG/ML IJ SOLN
INTRAMUSCULAR | Status: DC | PRN
  Administered 2015-05-21 (×2): .1 ug via INTRAVENOUS

## 2015-05-21 MED ORDER — CEFAZOLIN SODIUM-DEXTROSE 2-3 GM-% IV SOLR
INTRAVENOUS | Status: AC
  Filled 2015-05-21: qty 50

## 2015-05-21 MED ORDER — SODIUM CHLORIDE 0.9 % IV SOLN
Freq: Once | INTRAVENOUS | Status: AC
  Administered 2015-05-21: 16:00:00 via INTRAVENOUS

## 2015-05-21 MED ORDER — SODIUM CHLORIDE 0.9 % IJ SOLN
INTRAMUSCULAR | Status: AC
  Filled 2015-05-21: qty 3

## 2015-05-21 MED ORDER — ACETAMINOPHEN 10 MG/ML IV SOLN
INTRAVENOUS | Status: DC | PRN
  Administered 2015-05-21: 1000 mg via INTRAVENOUS

## 2015-05-21 MED ORDER — SODIUM CHLORIDE 0.9 % IV SOLN
10000.0000 ug | INTRAVENOUS | Status: DC | PRN
  Administered 2015-05-21: 75 ug via INTRAVENOUS

## 2015-05-21 MED ORDER — SODIUM CHLORIDE 0.9 % IV SOLN
INTRAVENOUS | Status: DC
  Administered 2015-05-21 (×3): via INTRAVENOUS

## 2015-05-21 MED ORDER — ENOXAPARIN SODIUM 30 MG/0.3ML ~~LOC~~ SOLN
30.0000 mg | Freq: Two times a day (BID) | SUBCUTANEOUS | Status: DC
  Administered 2015-05-22 – 2015-05-24 (×5): 30 mg via SUBCUTANEOUS
  Filled 2015-05-21 (×5): qty 0.3

## 2015-05-21 MED ORDER — METOCLOPRAMIDE HCL 10 MG PO TABS
10.0000 mg | ORAL_TABLET | Freq: Three times a day (TID) | ORAL | Status: AC
  Administered 2015-05-21 – 2015-05-23 (×8): 10 mg via ORAL
  Filled 2015-05-21 (×8): qty 1

## 2015-05-21 MED ORDER — PANTOPRAZOLE SODIUM 40 MG PO TBEC
40.0000 mg | DELAYED_RELEASE_TABLET | Freq: Two times a day (BID) | ORAL | Status: DC
  Administered 2015-05-21 – 2015-05-24 (×7): 40 mg via ORAL
  Filled 2015-05-21 (×7): qty 1

## 2015-05-21 MED ORDER — BUPIVACAINE LIPOSOME 1.3 % IJ SUSP
INTRAMUSCULAR | Status: AC
  Filled 2015-05-21: qty 20

## 2015-05-21 MED ORDER — FUROSEMIDE 20 MG PO TABS
20.0000 mg | ORAL_TABLET | Freq: Every day | ORAL | Status: DC
  Administered 2015-05-21 – 2015-05-24 (×4): 20 mg via ORAL
  Filled 2015-05-21 (×4): qty 1

## 2015-05-21 MED ORDER — SODIUM CHLORIDE 0.9 % IV SOLN
INTRAVENOUS | Status: DC | PRN
  Administered 2015-05-21: 70 mL

## 2015-05-21 MED ORDER — FENTANYL CITRATE (PF) 100 MCG/2ML IJ SOLN: 25.0000 ug | INTRAMUSCULAR | Status: DC | PRN

## 2015-05-21 MED ORDER — BUPIVACAINE-EPINEPHRINE 0.25% -1:200000 IJ SOLN
INTRAMUSCULAR | Status: DC | PRN
  Administered 2015-05-21: 30 mL

## 2015-05-21 MED ORDER — GLYCOPYRROLATE 0.2 MG/ML IJ SOLN
INTRAMUSCULAR | Status: DC | PRN
  Administered 2015-05-21 (×2): 0.2 mg via INTRAVENOUS

## 2015-05-21 MED ORDER — METFORMIN HCL 500 MG PO TABS
1000.0000 mg | ORAL_TABLET | Freq: Two times a day (BID) | ORAL | Status: DC
  Administered 2015-05-21 – 2015-05-24 (×6): 1000 mg via ORAL
  Filled 2015-05-21 (×6): qty 2

## 2015-05-21 MED ORDER — MORPHINE SULFATE 2 MG/ML IJ SOLN: 2.0000 mg | INTRAMUSCULAR | Status: DC | PRN

## 2015-05-21 MED ORDER — TRAMADOL HCL 50 MG PO TABS
50.0000 mg | ORAL_TABLET | ORAL | Status: DC | PRN
  Administered 2015-05-21: 100 mg via ORAL
  Administered 2015-05-23: 50 mg via ORAL
  Filled 2015-05-21: qty 1
  Filled 2015-05-21: qty 2

## 2015-05-21 MED ORDER — ALUM & MAG HYDROXIDE-SIMETH 200-200-20 MG/5ML PO SUSP: 30.0000 mL | ORAL | Status: DC | PRN

## 2015-05-21 MED ORDER — OXYCODONE HCL 5 MG PO TABS
5.0000 mg | ORAL_TABLET | ORAL | Status: DC | PRN
  Administered 2015-05-21 – 2015-05-24 (×9): 10 mg via ORAL
  Filled 2015-05-21 (×5): qty 2
  Filled 2015-05-21: qty 1
  Filled 2015-05-21 (×4): qty 2

## 2015-05-21 MED ORDER — ONDANSETRON HCL 4 MG/2ML IJ SOLN
INTRAMUSCULAR | Status: DC | PRN
  Administered 2015-05-21: 4 mg via INTRAVENOUS

## 2015-05-21 SURGICAL SUPPLY — 58 items
AUTOTRANSFUS HAS 1/8 (MISCELLANEOUS) ×2
BATTERY INSTRU NAVIGATION (MISCELLANEOUS) ×8 IMPLANT
BLADE SAW 1 (BLADE) ×2 IMPLANT
BLADE SAW 1/2 (BLADE) ×2 IMPLANT
CANISTER SUCT 1200ML W/VALVE (MISCELLANEOUS) ×2 IMPLANT
CANISTER SUCT 3000ML (MISCELLANEOUS) ×4 IMPLANT
CAP KNEE TOTAL 3 SIGMA ×2 IMPLANT
CATH TRAY 16F METER LATEX (MISCELLANEOUS) ×2 IMPLANT
CEMENT HV SMART SET (Cement) ×4 IMPLANT
COOLER POLAR GLACIER W/PUMP (MISCELLANEOUS) ×2 IMPLANT
DRAPE INCISE IOBAN 66X45 STRL (DRAPES) ×2 IMPLANT
DRAPE SHEET LG 3/4 BI-LAMINATE (DRAPES) ×2 IMPLANT
DRESSING ALLEVYN LIFE SACRUM (GAUZE/BANDAGES/DRESSINGS) ×2 IMPLANT
DRSG DERMACEA 8X12 NADH (GAUZE/BANDAGES/DRESSINGS) ×2 IMPLANT
DRSG OPSITE POSTOP 4X14 (GAUZE/BANDAGES/DRESSINGS) ×2 IMPLANT
DURAPREP 26ML APPLICATOR (WOUND CARE) ×4 IMPLANT
ELECT CAUTERY BLADE 6.4 (BLADE) ×2 IMPLANT
EX-PIN ORTHOLOCK NAV 4X150 (PIN) ×4 IMPLANT
GLOVE BIOGEL M STRL SZ7.5 (GLOVE) ×4 IMPLANT
GLOVE INDICATOR 8.0 STRL GRN (GLOVE) ×2 IMPLANT
GLOVE SURG 9.0 ORTHO LTXF (GLOVE) ×2 IMPLANT
GLOVE SURG ORTHO 9.0 STRL STRW (GLOVE) ×2 IMPLANT
GOWN STRL REUS W/ TWL LRG LVL3 (GOWN DISPOSABLE) ×1 IMPLANT
GOWN STRL REUS W/TWL 2XL LVL3 (GOWN DISPOSABLE) ×2 IMPLANT
GOWN STRL REUS W/TWL LRG LVL3 (GOWN DISPOSABLE) ×1
GOWN STRL REUS W/TWL XL LVL4 (GOWN DISPOSABLE) ×2 IMPLANT
HANDPIECE SUCTION TUBG SURGILV (MISCELLANEOUS) ×2 IMPLANT
HAS AUTOTRANSFUSION SYSTEM ×2 IMPLANT
HOLDER FOLEY CATH W/STRAP (MISCELLANEOUS) ×2 IMPLANT
HOOD PEEL AWAY FACE SHEILD DIS (HOOD) ×4 IMPLANT
KNIFE SCULPS 14X20 (INSTRUMENTS) ×2 IMPLANT
NDL SAFETY 18GX1.5 (NEEDLE) ×2 IMPLANT
NEEDLE SPNL 18GX3.5 QUINCKE PK (NEEDLE) ×2 IMPLANT
NEEDLE SPNL 20GX3.5 QUINCKE YW (NEEDLE) ×2 IMPLANT
NS IRRIG 500ML POUR BTL (IV SOLUTION) ×2 IMPLANT
PACK TOTAL KNEE (MISCELLANEOUS) ×2 IMPLANT
PAD GROUND ADULT SPLIT (MISCELLANEOUS) ×2 IMPLANT
PAD WRAPON POLAR KNEE (MISCELLANEOUS) ×1 IMPLANT
PIN FIXATION 1/8DIA X 3INL (PIN) IMPLANT
SOL .9 NS 3000ML IRR  AL (IV SOLUTION) ×1
SOL .9 NS 3000ML IRR UROMATIC (IV SOLUTION) ×1 IMPLANT
SOL PREP PVP 2OZ (MISCELLANEOUS) ×2
SOLUTION PREP PVP 2OZ (MISCELLANEOUS) ×1 IMPLANT
SPONGE DRAIN TRACH 4X4 STRL 2S (GAUZE/BANDAGES/DRESSINGS) ×2 IMPLANT
STAPLER SKIN PROX 35W (STAPLE) ×2 IMPLANT
STRAP SAFETY BODY (MISCELLANEOUS) ×2 IMPLANT
SUCTION FRAZIER TIP 10 FR DISP (SUCTIONS) ×2 IMPLANT
SUT VIC AB 0 CT1 36 (SUTURE) ×2 IMPLANT
SUT VIC AB 1 CT1 36 (SUTURE) ×4 IMPLANT
SUT VIC AB 2-0 CT2 27 (SUTURE) ×2 IMPLANT
SYR 20CC LL (SYRINGE) ×2 IMPLANT
SYR 30ML LL (SYRINGE) ×2 IMPLANT
SYR 50ML LL SCALE MARK (SYRINGE) ×2 IMPLANT
SYSTEM AUTOTRANSFUS DUAL TROCR (MISCELLANEOUS) ×1 IMPLANT
TOWEL OR 17X26 4PK STRL BLUE (TOWEL DISPOSABLE) ×2 IMPLANT
TOWER CARTRIDGE SMART MIX (DISPOSABLE) ×2 IMPLANT
WATER STERILE IRR 1000ML POUR (IV SOLUTION) ×2 IMPLANT
WRAPON POLAR PAD KNEE (MISCELLANEOUS) ×2

## 2015-05-21 NOTE — Anesthesia Preprocedure Evaluation (Signed)
Anesthesia Evaluation  Patient identified by MRN, date of birth, ID band Patient awake    Reviewed: Allergy & Precautions, NPO status , Patient's Chart, lab work & pertinent test results  Airway Mallampati: III  TM Distance: >3 FB Neck ROM: Full    Dental   Pulmonary shortness of breath (with CHF), former smoker (quit x 26 yrs),          Cardiovascular hypertension, Pt. on medications + CAD (s/p stents) and +CHF (hx of now stable/ s/p stents) + dysrhythmias (PVCs)     Neuro/Psych    GI/Hepatic   Endo/Other  diabetes, Type 2, Oral Hypoglycemic Agents  Renal/GU      Musculoskeletal   Abdominal   Peds  Hematology   Anesthesia Other Findings   Reproductive/Obstetrics                             Anesthesia Physical Anesthesia Plan  ASA: III  Anesthesia Plan: Spinal   Post-op Pain Management:    Induction:   Airway Management Planned: Nasal Cannula  Additional Equipment:   Intra-op Plan:   Post-operative Plan:   Informed Consent: I have reviewed the patients History and Physical, chart, labs and discussed the procedure including the risks, benefits and alternatives for the proposed anesthesia with the patient or authorized representative who has indicated his/her understanding and acceptance.     Plan Discussed with:   Anesthesia Plan Comments:         Anesthesia Quick Evaluation

## 2015-05-21 NOTE — Transfer of Care (Signed)
Immediate Anesthesia Transfer of Care Note  Patient: Daniel Luna  Procedure(s) Performed: Procedure(s): TOTAL KNEE ARTHROPLASTY (Right)  Patient Location: PACU  Anesthesia Type:Spinal  Level of Consciousness: awake, oriented and patient cooperative  Airway & Oxygen Therapy: Patient Spontanous Breathing, Patient connected to face mask oxygen and Patient connected to face mask  Post-op Assessment: Report given to RN and Post -op Vital signs reviewed and stable  Post vital signs: Reviewed and stable  Last Vitals:  Filed Vitals:   05/21/15 1127  BP: 107/70  Pulse: 75  Temp: 35.8 C  Resp:     Complications: No apparent anesthesia complications

## 2015-05-21 NOTE — H&P (Signed)
The patient has been re-examined, and the chart reviewed, and there have been no interval changes to the documented history and physical.    The risks, benefits, and alternatives have been discussed at length, and the patient is willing to proceed.   

## 2015-05-21 NOTE — Brief Op Note (Signed)
05/21/2015  11:23 AM  PATIENT:  Daniel Luna  74 y.o. male  PRE-OPERATIVE DIAGNOSIS:  DEGENERATIVE OSTEOARTHRITIS RIGHT KNEE  POST-OPERATIVE DIAGNOSIS:  DEGENERATIVE OSTEOARTHRITIS RIGHT KNEE  PROCEDURE:  Procedure(s): TOTAL KNEE ARTHROPLASTY (Right) using computer assisted navigation  SURGEON:  Surgeon(s) and Role:    * Dereck Leep, MD - Primary  ASSISTANTS: Vance Peper, PA   ANESTHESIA:   spinal  EBL:  Total I/O In: 1000 [I.V.:1000] Out: 550 [Urine:500; Blood:50]  BLOOD ADMINISTERED:none  DRAINS: 2 drains to reinfusion system   LOCAL MEDICATIONS USED:  MARCAINE    and OTHER Exparel  SPECIMEN:  No Specimen  DISPOSITION OF SPECIMEN:  N/A  COUNTS:  YES  TOURNIQUET:  117 min  DICTATION: .Dragon Dictation  PLAN OF CARE: Admit to inpatient   PATIENT DISPOSITION:  PACU - hemodynamically stable.   Delay start of Pharmacological VTE agent (>24hrs) due to surgical blood loss or risk of bleeding: yes

## 2015-05-21 NOTE — Anesthesia Procedure Notes (Signed)
Spinal Patient location during procedure: OR Start time: 05/21/2015 7:06 AM End time: 05/21/2015 7:23 AM Staffing Performed by: anesthesiologist  Preanesthetic Checklist Completed: patient identified, site marked, surgical consent, pre-op evaluation, timeout performed, IV checked, risks and benefits discussed and monitors and equipment checked Spinal Block Patient position: sitting Prep: Betadine Patient monitoring: heart rate, cardiac monitor, continuous pulse ox and blood pressure Approach: midline Location: L4-5 Injection technique: single-shot Needle Needle type: Whitacre  Needle gauge: 25 G Needle length: 10 cm Needle insertion depth: 7 cm Assessment Sensory level: T8

## 2015-05-21 NOTE — Care Management Note (Addendum)
Case Management Note  Patient Details  Name: Daniel Luna MRN: [id number] Date of Birth: 1940-12-22  Subjective/Objective:                  Patient received from PACU. He is resting comfortably in bed with his wife at his side. He states he would like to go to SNF at discharge but "depends on how well he does". He fears "being a burden on his wife". His wife agrees. He does not have a rolling walker at home and is not familiar with any home health agencies. He uses [facility] for Rx.   Action/Plan:  Patient encouraged to work with PT tomorrow. Referral made to [facility] as requested by patient from home health list. Rolling walker will be ordered through [facility] and Lovenox called in if patient can return home at discharge. RNCM will continue to follow.   Expected Discharge Date:                  Expected Discharge Plan:     In-House Referral:  Clinical Social Work  Discharge planning Services  CM Consult  Post Acute Care Choice:    Choice offered to:  Patient, Spouse  DME Arranged:    DME Agency:     HH Arranged:    [place] Agency:     Status of Service:     Medicare Important Message Given:    Date Medicare IM Given:    Medicare IM give by:    Date Additional Medicare IM Given:    Additional Medicare Important Message give by:     If discussed at [place] of Stay Meetings, dates discussed:    Additional Comments:  Marshell Garfinkel, RN 05/21/2015, 12:58 PM

## 2015-05-21 NOTE — Anesthesia Postprocedure Evaluation (Signed)
  Anesthesia Post-op Note  Patient: Daniel Luna  Procedure(s) Performed: Procedure(s): TOTAL KNEE ARTHROPLASTY (Right)  Anesthesia type:Spinal  Patient location: PACU  Post pain: Pain level controlled  Post assessment: Post-op Vital signs reviewed, Patient's Cardiovascular Status Stable, Respiratory Function Stable, Patent Airway and No signs of Nausea or vomiting  Post vital signs: Reviewed and stable  Last Vitals:  Filed Vitals:   05/21/15 1524  BP: 120/64  Pulse: 63  Temp: 36.4 C  Resp: 18    Level of consciousness: awake, alert  and patient cooperative  Complications: No apparent anesthesia complications

## 2015-05-21 NOTE — Op Note (Signed)
DATE OF SURGERY:  05/21/2015  PATIENT NAME:  Daniel Luna   DOB: 1941/05/04  MRN: [id number]  PRE-OPERATIVE DIAGNOSIS: Degenerative arthrosis of the right knee, primary  POST-OPERATIVE DIAGNOSIS:  Same  PROCEDURE:  Right total knee arthroplasty using computer-assisted navigation  SURGEON:  Marciano Sequin. M.D.  ASSISTANT:  Vance Peper, PA (present and scrubbed throughout the case, critical for assistance with exposure, retraction, instrumentation, and closure)  ANESTHESIA: spinal  ESTIMATED BLOOD LOSS: 50 mL  FLUIDS REPLACED: 1000 mL of crystalloid  TOURNIQUET TIME: 117 minutes  DRAINS: 2 medium drains to a reinfusion system  SOFT TISSUE RELEASES: Anterior cruciate ligament, posterior cruciate ligament, deep medial collateral ligament, patellofemoral ligament, posterolateral corner  IMPLANTS UTILIZED: DePuy PFC Sigma size 5 posterior stabilized femoral component (cemented), size 5 MBT tibial component (cemented), 38 mm 3 peg oval dome patella (cemented), and a 10 mm stabilized rotating platform polyethylene insert.  INDICATIONS FOR SURGERY: Daniel Luna is a 74 y.o. year old male with a long history of progressive knee pain. X-rays demonstrated severe degenerative changes in tricompartmental fashion. He had not seen any significant improvement despite conservative nonsurgical intervention. After discussion of the risks and benefits of surgical intervention, the patient expressed understanding of the risks benefits and agree with plans for total knee arthroplasty.   The risks, benefits, and alternatives were discussed at length including but not limited to the risks of infection, bleeding, nerve injury, stiffness, blood clots, the need for revision surgery, cardiopulmonary complications, among others, and they were willing to proceed.  PROCEDURE IN DETAIL: The patient was brought into the operating room and, after adequate spinal anesthesia was achieved, a tourniquet was placed on the  patient's upper thigh. The patient's knee and leg were cleaned and prepped with alcohol and DuraPrep and draped in the usual sterile fashion. A "timeout" was performed as per usual protocol. The lower extremity was exsanguinated using an Esmarch, and the tourniquet was inflated to 300 mmHg. An anterior longitudinal incision was made followed by a standard mid vastus approach. The deep fibers of the medial collateral ligament were elevated in a subperiosteal fashion off of the medial flare of the tibia so as to maintain a continuous soft tissue sleeve. The patella was subluxed laterally and the patellofemoral ligament was incised. Inspection of the knee demonstrated severe degenerative changes with full-thickness loss of articular cartilage. Osteophytes were debrided using a rongeur. Anterior and posterior cruciate ligaments were excised. Two 4.0 mm Schanz pins were inserted in the femur and into the tibia for attachment of the array of trackers used for computer-assisted navigation. Hip center was identified using a circumduction technique. Distal landmarks were mapped using the computer. The distal femur and proximal tibia were mapped using the computer. The distal femoral cutting guide was positioned using computer-assisted navigation so as to achieve a 5 distal valgus cut. The femur was sized and it was felt that a size 5 femoral component was appropriate. A size 5 femoral cutting guide was positioned and the anterior cut was performed and verified using the computer. This was followed by completion of the posterior and chamfer cuts. Femoral cutting guide for the central box was then positioned in the center box cut was performed.  Attention was then directed to the proximal tibia. Medial and lateral menisci were excised. The extramedullary tibial cutting guide was positioned using computer-assisted navigation so as to achieve a 0 varus-valgus alignment and 0 posterior slope. The cut was performed and  verified using the computer.  The proximal tibia was sized and it was felt that a size 5 tibial tray was appropriate. Tibial and femoral trials were inserted followed by insertion of a 10 mm polyethylene insert. The knee was tight in both flexion and extension. Trial implants were removed and an additional 2 mm of bone was resected and confirmed using the computer. Trial components were reinserted, but the knee was still felt to be tight laterally. Trial components were again removed and the knee was placed in extension and distracted using the Moreland retractors. The posterolateral corner was carefully released using combination of electrocautery and Metzenbaum scissors. This allowed for excellent mediolateral soft tissue balancing both in flexion and in full extension. Finally, the patella was cut and prepared so as to accommodate a 38 mm 3 peg oval dome patella. A patella trial was placed and the knee was placed through a range of motion with excellent patellar tracking appreciated. The femoral trial was removed after debridement of posterior osteophytes. The central post-hole for the tibial component was reamed followed by insertion of a keel punch. Tibial trials were then removed. Cut surfaces of bone were irrigated with copious amounts of normal saline with antibiotic solution using pulsatile lavage and then suctioned dry. Polymethylmethacrylate cement was prepared in the usual fashion using a vacuum mixer. Cement was applied to the cut surface of the proximal tibia as well as along the undersurface of a size 5 MBT tibial component. Tibial component was positioned and impacted into place. Excess cement was removed using [employer] fast streamer. Cement was then applied to the cut surfaces of the femur as well as along the posterior flanges of the size 5 femoral component. The femoral component was positioned and impacted into place. Excess cement was removed using [employer] fast streamer. A 10 mm polyethylene trial was  inserted and the knee was brought into full extension with steady axial compression applied. Finally, cement was applied to the backside of a 38 mm 3 peg oval dome patella and the patellar component was positioned and patellar clamp applied. Excess cement was removed using [employer] fast streamer. After adequate curing of the cement, the tourniquet was deflated after a total tourniquet time of 117 minutes. Hemostasis was achieved using electrocautery. The knee was irrigated with copious amounts of normal saline with antibiotic solution using pulsatile lavage and then suctioned dry. 20 mL of 1.3% Exparel in 40 mL of normal saline was injected along the posterior capsule, medial and lateral gutters, and along the arthrotomy site. A 10 mm stabilized rotating platform polyethylene insert was inserted and the knee was placed through a range of motion with excellent mediolateral soft tissue balancing appreciated and excellent patellar tracking noted. 2 medium drains were placed in the wound bed and brought out through separate stab incisions to be attached to a reinfusion system. The medial parapatellar portion of the incision was reapproximated using interrupted sutures of #1 Vicryl. Subcutaneous tissue was then injected with a total of 30 cc of 0.25% Marcaine with epinephrine. Subcutaneous tissue was approximated in layers using first #0 Vicryl followed #2-0 Vicryl. The skin was approximated with skin staples. A sterile dressing was applied.  The patient tolerated the procedure well and was transported to the recovery room in stable condition.    James P. Holley Bouche., M.D.

## 2015-05-21 NOTE — Progress Notes (Signed)
Clipper prep right leg preop by Lujean Rave, CNA Sage wipe right leg preop by Phillips Grout, RN (pt did showers x 2 at home) Thigh high TED applied to non-op/left leg preop Phillips Grout, RN - other stocking on chart To OR with thermal cap in place  Sacral dressing sent to OR with patient

## 2015-05-22 LAB — GLUCOSE, CAPILLARY
GLUCOSE-CAPILLARY: 142 mg/dL — AB (ref 65–99)
GLUCOSE-CAPILLARY: 157 mg/dL — AB (ref 65–99)
GLUCOSE-CAPILLARY: 115 mg/dL — AB (ref 65–99)
Glucose-Capillary: 147 mg/dL — ABNORMAL HIGH (ref 65–99)

## 2015-05-22 LAB — BASIC METABOLIC PANEL
Anion gap: 7 (ref 5–15)
BUN: 16 mg/dL (ref 6–20)
CO2: 25 mmol/L (ref 22–32)
CREATININE: 1.26 mg/dL — AB (ref 0.61–1.24)
Calcium: 8.5 mg/dL — ABNORMAL LOW (ref 8.9–10.3)
Chloride: 105 mmol/L (ref 101–111)
GFR calc Af Amer: >60 mL/min (ref >60)
GFR calc non Af Amer: 56 mL/min — ABNORMAL LOW (ref >60)
Glucose, Bld: 122 mg/dL — ABNORMAL HIGH (ref 65–99)
Potassium: 3.9 mmol/L (ref 3.5–5.1)
Sodium: 137 mmol/L (ref 135–145)

## 2015-05-22 LAB — CBC
HCT: 33.3 % — ABNORMAL LOW (ref 40.0–52.0)
Hemoglobin: 11.3 g/dL — ABNORMAL LOW (ref 13.0–18.0)
MCH: 32.3 pg (ref 26.0–34.0)
MCHC: 34 g/dL (ref 32.0–36.0)
MCV: 95.1 fL (ref 80.0–100.0)
Platelets: 115 10*3/uL — ABNORMAL LOW (ref 150–440)
RBC: 3.51 MIL/uL — AB (ref 4.40–5.90)
RDW: 13.1 % (ref 11.5–14.5)
WBC: 6.4 10*3/uL (ref 3.8–10.6)

## 2015-05-22 MED FILL — Neomycin-Polymyxin B GU Irrigation Soln: Qty: 12 | Status: AC

## 2015-05-22 MED FILL — Sodium Chloride Irrigation Soln 0.9%: Qty: 3000 | Status: AC

## 2015-05-22 NOTE — Progress Notes (Signed)
Pt is alert and oriented. VSS. Pain improved with PO pain medication on MAR. Denies nausea. IVF infusing. Foley and hemovac in place and draining. Sleeping between care, will continue to monitor.

## 2015-05-22 NOTE — Evaluation (Signed)
Physical Therapy Evaluation Patient Details Name: Daniel Luna MRN: [id number] DOB: 20-Nov-1941 Today's Date: 05/22/2015   History of Present Illness  Pt is a 74 y.o. male s/p R TKA 05/21/15.  Clinical Impression  Currently pt demonstrates impairments with R LE strength, R knee ROM, and limitations with functional mobility.  Prior to admission, pt was independent ambulating without AD and working full time.  Pt lives with his wife in 1 level home with 2 steps to enter (no railing).  Currently pt is min assist with transfers and ambulation with RW.  Pt would benefit from skilled PT to address above noted impairments and functional limitations.  Recommend pt discharge to home with HHPT and support of family when medically appropriate.     Follow Up Recommendations Home health PT;Supervision - Intermittent    Equipment Recommendations  Rolling walker with 5" wheels;3in1 (PT)    Recommendations for Other Services       Precautions / Restrictions Precautions Precautions: Fall Restrictions Weight Bearing Restrictions: Yes RLE Weight Bearing: Weight bearing as tolerated      Mobility  Bed Mobility Overal bed mobility: Needs Assistance Bed Mobility: Supine to Sit     Supine to sit: Supervision;HOB elevated     General bed mobility comments: assist for drains/lines; vc's for safe technique moving R LE  Transfers Overall transfer level: Needs assistance Equipment used: Rolling walker (2 wheeled) Transfers: Sit to/from Stand Sit to Stand: Min assist         General transfer comment: vc's required for hand and feet placement  Ambulation/Gait Ambulation/Gait assistance: Min guard;Min assist Ambulation Distance (Feet): 40 Feet Assistive device: Rolling walker (2 wheeled)       General Gait Details: antalgic; decreased stance time R LE; decreased cadence  Stairs            Wheelchair Mobility    Modified Rankin (Stroke Patients Only)       Balance                                             Pertinent Vitals/Pain Pain Assessment: 0-10 Pain Score: 4  Pain Location: R knee Pain Descriptors / Indicators: Constant Pain Intervention(s): Limited activity within patient's tolerance;Monitored during session;Premedicated before session;Repositioned;Ice applied  Vitals stable and WFL throughout treatment session.     Home Living Family/patient expects to be discharged to:: Private residence Living Arrangements: Spouse/significant other Available Help at Discharge: Family Type of Home: House Home Access: Stairs to enter Entrance Stairs-Rails: None Entrance Stairs-Number of Steps: 2 from garage Fertile: One [place]: None      Prior Function Level of Independence: Independent         Comments: Works full time (desk job); active     Journalist, newspaper        Extremity/Trunk Assessment   Upper Extremity Assessment: Overall WFL for tasks assessed           Lower Extremity Assessment: RLE deficits/detail RLE Deficits / Details: R hip flexion at least 3+/5; R knee flexion/extension at least 3-/5 (limited d/t pain); R DF at least 3+/5  Able to perform R SLR x10 independently.       Communication   Communication: No difficulties  Cognition Arousal/Alertness: Awake/alert Behavior During Therapy: WFL for tasks assessed/performed Overall Cognitive Status: Within Functional Limits for tasks assessed  General Comments  Nursing cleared pt for participation in physical therapy.  Pt agreeable to PT eval.    Exercises Total Joint Exercises Goniometric ROM: R knee extension 13 degrees short of neutral (semi-supine); R knee flexion 77 degrees (sitting)  Treatment:  Performed semi-supine B LE therapeutic exercise x 10 reps:  Ankle pumps (AROM B LE's); quad sets x3 second holds (AROM B LE's); SAQ's (AROM R; AROM L); heelslides (AAROM R; AROM L), hip abd/adduction (AROM R; AROM L), and  SLR (AROM R; AROM L).  Pt required vc's and tactile cues for correct technique with exercises.       Assessment/Plan    PT Assessment Patient needs continued PT services  PT Diagnosis Difficulty walking;Acute pain   PT Problem List Decreased strength;Decreased range of motion;Decreased activity tolerance;Decreased balance;Decreased mobility;Decreased knowledge of use of DME;Decreased knowledge of precautions;Pain  PT Treatment Interventions DME instruction;Gait training;Stair training;Functional mobility training;Therapeutic activities;Therapeutic exercise;Balance training;Patient/family education   PT Goals (Current goals can be found in the Care Plan section) Acute Rehab PT Goals Patient Stated Goal: To go home PT Goal Formulation: With patient Time For Goal Achievement: 06/05/15 Potential to Achieve Goals: Good    Frequency BID   Barriers to discharge        Co-evaluation               End of Session Equipment Utilized During Treatment: Gait belt Activity Tolerance: Patient tolerated treatment well Patient left: in chair;with call bell/phone within reach;with chair alarm set;with SCD's reapplied (B heels elevated via towel rolls; polar care applied) Nurse Communication: Mobility status         Time: 1749-4496 PT Time Calculation (min) (ACUTE ONLY): 36 min   Charges:   PT Evaluation $Initial PT Evaluation Tier I: 1 Procedure PT Treatments $Therapeutic Exercise: 8-22 mins   PT G CodesLeitha Bleak 05-23-2015, 11:17 AM Leitha Bleak, [place]

## 2015-05-22 NOTE — Care Management (Addendum)
PT is recommending HHPT which has been arranged through [facility]. Rolling walker requested from [facility] to be delivered to this room. Patient wants to go home; his wife is concerned. Lovenox 40mg  #14 called in to [place] [phone]. RNCM will continue to follow. Lovenox $95.00. Rolling walker delivered to this room by [facility].

## 2015-05-22 NOTE — Progress Notes (Signed)
Physical Therapy Treatment Patient Details Name: Daniel Luna MRN: [id number] DOB: October 15, 1941 Today's Date: 05/22/2015    History of Present Illness Pt is a 74 y.o. male s/p R TKA 05/21/15.    PT Comments    Pt progressing well this afternoon with ambulation distance using RW (limited distance d/t increased BP this afternoon).  Pt's BP initially 187/83 upon PT arrival but decreased to 164/81 after pt finally was successfull voiding in toilet; BP increased to 178/92 after ambulation (nursing aware).  Anticipate with continued progress pt will be able to discharge home with support of his wife and HHPT.  Follow Up Recommendations  Home health PT;Supervision - Intermittent     Equipment Recommendations  Rolling walker with 5" wheels;3in1 (PT)    Recommendations for Other Services       Precautions / Restrictions Precautions Precautions: Fall Restrictions Weight Bearing Restrictions: Yes RLE Weight Bearing: Weight bearing as tolerated    Mobility  Bed Mobility                  Transfers Overall transfer level: Needs assistance Equipment used: Rolling walker (2 wheeled) Transfers: Sit to/from Stand;Stand Pivot Transfers Sit to Stand: Min guard;Min assist Stand pivot transfers: Min guard;Min assist (transfer to commode over toilet)       General transfer comment: vc's required for hand and feet placement  Ambulation/Gait Ambulation/Gait assistance: Min guard Ambulation Distance (Feet): 160 Feet Assistive device: Rolling walker (2 wheeled)       General Gait Details: antalgic; decreased stance time R LE; decreased cadence   Stairs            Wheelchair Mobility    Modified Rankin (Stroke Patients Only)       Balance Overall balance assessment:  (Pt stood to urinate in toilet with CGA)                                  Cognition Arousal/Alertness: Awake/alert Behavior During Therapy: WFL for tasks assessed/performed Overall Cognitive  Status: Within Functional Limits for tasks assessed                      Exercises   Performed sitting exercises x 10 reps B LE's:  Heel/toe raises (AROM R; AROM L); LAQ's (AROM R; AROM L); marching/hip flexion (AROM R; AROM L).  Pt required vc's and tactile cues for correct technique with exercises.'    General Comments  Nursing cleared pt for participation in PT and pt agreeable to PT session.      Pertinent Vitals/Pain Pain Assessment: 0-10 Pain Score: 6  Pain Location: R knee Pain Descriptors / Indicators: Constant Pain Intervention(s): Limited activity within patient's tolerance;Monitored during session    Home Living                      Prior Function            PT Goals (current goals can now be found in the care plan section) Acute Rehab PT Goals Patient Stated Goal: To go home PT Goal Formulation: With patient Time For Goal Achievement: 06/05/15 Potential to Achieve Goals: Good Progress towards PT goals: Progressing toward goals    Frequency  BID    PT Plan Current plan remains appropriate    Co-evaluation             End of Session Equipment Utilized During Treatment: Gait  belt Activity Tolerance: Patient tolerated treatment well Patient left:  (on commode over toilet (pt trying to have BM; nursing aware and cleared pt to be on toilet without alarm and pt educated to pull call cord and not get up on his own))     Time: 1330-1410 PT Time Calculation (min) (ACUTE ONLY): 40 min  Charges:  $Gait Training: 8-22 mins $Therapeutic Exercise: 8-22 mins $Therapeutic Activity: 8-22 mins                    G CodesLeitha Bleak 05-28-15, 4:35 PM Leitha Bleak, [place]

## 2015-05-22 NOTE — Progress Notes (Signed)
   Subjective: 1 Day Post-Op Procedure(s) (LRB): TOTAL KNEE ARTHROPLASTY (Right) Patient reports pain as 3 on 0-10 scale.   Patient is well, and has had no acute complaints or problems We will start therapy today.  Plan is to go home after hospital stay. But is open to rehab if needed no nausea and no vomiting Patient denies any chest pains or shortness of breath. Objective: Vital signs in last 24 hours: Temp:  [96.4 F (35.8 C)-98.6 F (37 C)] 97.7 F (36.5 C) (06/09 0426) Pulse Rate:  [29-99] 68 (06/09 0426) Resp:  [14-18] 18 (06/09 0426) BP: (104-156)/(61-85) 156/71 mmHg (06/09 0426) SpO2:  [94 %-100 %] 100 % (06/09 0426) Patient still has original dressing in place. Heels are non tender and elevated off the bed using rolled towels Intake/Output from previous day: 06/08 0701 - 06/09 0700 In: 4111.2 [P.O.:240; I.V.:2551.7; Blood:565.6; IV Piggyback:450] Out: 2919 [Urine:2400; Drains:200; Blood:50] Intake/Output this shift: Total I/O In: 2159.7 [I.V.:1351.7; Blood:179; Other:179; IV Piggyback:450] Out: 950 [Urine:750; Drains:200]   Recent Labs  05/22/15 0516  HGB 11.3*    Recent Labs  05/22/15 0516  WBC 6.4  RBC 3.51*  HCT 33.3*  PLT 115*   No results for input(s): NA, K, CL, CO2, BUN, CREATININE, GLUCOSE, CALCIUM in the last 72 hours. No results for input(s): LABPT, INR in the last 72 hours.  EXAM General - Patient is Alert, Appropriate and Oriented Extremity - Neurologically intact Neurovascular intact Sensation intact distally Intact pulses distally Dorsiflexion/Plantar flexion intact Dressing - dressing C/D/I Motor Function - intact, moving foot and toes well on exam. Has good gross motor strength against resistance of dorsiflexion and plantarflexion of the foot  Past Medical History  Diagnosis Date  . Hypertension   . Hyperlipidemia   . Diabetes mellitus without complication   . Heart failure   . Cyst of brain 1983  . Macular degeneration   .  Dysrhythmia   . Coronary artery disease     stents times 3  . Arthritis   . CHF (congestive heart failure)   . Shortness of breath dyspnea     Assessment/Plan: 1 Day Post-Op Procedure(s) (LRB): TOTAL KNEE ARTHROPLASTY (Right) Active Problems:   Right knee DJD   Total knee replacement status  Estimated body mass index is 28.37 kg/(m^2) as calculated from the following:   Height as of this encounter: 6\' 3"  (1.905 m).   Weight as of this encounter: 102.967 kg (227 lb). Advance diet Up with therapy when tolerating fluids well.  Labs: Were reviewed on today's visit. Hemoglobin tendon for tomorrow morning DVT Prophylaxis - Lovenox, Foot Pumps and TED hose Weight-Bearing as tolerated to right leg D/C O2 and Pulse OX and try on Room Air  Jon R. [place] 05/22/2015, 6:49 AM

## 2015-05-22 NOTE — Progress Notes (Signed)
Patient ambulated with Physical therapy around unit. Tolerated well. Foley discontinued this am and patient has voided in toilet, unmeasured.  MOM given; awaiting results. B/p elevated and patient medicated with blood pressure meds as ordered. Will continue to monitor.  Pain control effective with po pain meds.  Wife visited at bedside today. Pt also sat in recliner today.

## 2015-05-22 NOTE — Progress Notes (Signed)
Clinical Social Worker (CSW) received SNF consult. PT is recommending home health. RN Case Manager aware of above. Please reconsult if future social work needs arise. CSW signing off.   Tristin Vandeusen Morgan, LCSWA [phone] 

## 2015-05-22 NOTE — Evaluation (Addendum)
Occupational Therapy Evaluation Patient Details Name: Daniel Luna MRN: [id number] DOB: 18-Aug-1941 Today's Date: 05/22/2015    History of Present Illness This patient is a 74 year old male who came to [facility] for a R TKR   Clinical Impression   This patient is a 74 year old male who came to [facility] for a right total knee replacement.  Patient lives in a one story home with 2 steps to enter.  He had been independent with ADL and functional mobility and works full time as a Electrical engineer. He now requires minimal assistance for lower body dressing. He practiced Donned/doffed socks and pants to knees (drain still in place).  He would benefit from Occupational Therapy for ADL/functioal mobility training.       Follow Up Recommendations       Equipment Recommendations       Recommendations for Other Services       Precautions / Restrictions        Mobility Bed Mobility                  Transfers                      Balance                                            ADL Overall ADL's : Independent (previous)                                             Vision     Perception     Praxis      Pertinent Vitals/Pain       Hand Dominance     Extremity/Trunk Assessment Upper Extremity Assessment Upper Extremity Assessment: Overall WFL for tasks assessed           Communication     Cognition Arousal/Alertness: Awake/alert Behavior During Therapy: WFL for tasks assessed/performed                       General Comments       Exercises       Shoulder Instructions      Home Living Family/patient expects to be discharged to:: Private residence Living Arrangements: Spouse/significant other                                      Prior Functioning/Environment               OT Diagnosis:     OT Problem List:     OT Treatment/Interventions:       OT Goals(Current goals can be found in the care plan section) Acute Rehab OT Goals Patient Stated Goal: To go home  OT Frequency:     Barriers to D/C:            Co-evaluation              End of Session    Activity Tolerance:   Patient left:     Time: 0950-1010 OT Time Calculation (min): 20 min Charges:  OT General Charges $OT Visit: 1 Procedure OT Evaluation $Initial  OT Evaluation Tier I: 1 Procedure OT Treatments $Self Care/Home Management : 8-22 mins G-Codes:    Myrene Galas, MS/OTR/L  05/22/2015, 10:25 AM

## 2015-05-23 LAB — CBC
HCT: 33.3 % — ABNORMAL LOW (ref 40.0–52.0)
HEMOGLOBIN: 11.6 g/dL — AB (ref 13.0–18.0)
MCH: 33.1 pg (ref 26.0–34.0)
MCHC: 34.7 g/dL (ref 32.0–36.0)
MCV: 95.4 fL (ref 80.0–100.0)
PLATELETS: 117 10*3/uL — AB (ref 150–440)
RBC: 3.49 MIL/uL — ABNORMAL LOW (ref 4.40–5.90)
RDW: 13 % (ref 11.5–14.5)
WBC: 6.3 10*3/uL (ref 3.8–10.6)

## 2015-05-23 LAB — GLUCOSE, CAPILLARY
GLUCOSE-CAPILLARY: 157 mg/dL — AB (ref 65–99)
GLUCOSE-CAPILLARY: 136 mg/dL — AB (ref 65–99)
Glucose-Capillary: 136 mg/dL — ABNORMAL HIGH (ref 65–99)
Glucose-Capillary: 121 mg/dL — ABNORMAL HIGH (ref 65–99)

## 2015-05-23 MED ORDER — OXYCODONE HCL 5 MG PO TABS: 5.0000 mg | ORAL_TABLET | ORAL | Status: DC | PRN

## 2015-05-23 MED ORDER — ENOXAPARIN SODIUM 40 MG/0.4ML ~~LOC~~ SOLN: 40.0000 mg | SUBCUTANEOUS | Status: DC

## 2015-05-23 MED ORDER — TRAMADOL HCL 50 MG PO TABS: 50.0000 mg | ORAL_TABLET | ORAL | Status: DC | PRN

## 2015-05-23 NOTE — Progress Notes (Signed)
   Subjective: 2 Days Post-Op Procedure(s) (LRB): TOTAL KNEE ARTHROPLASTY (Right) Patient reports pain as 2 on 0-10 scale.   Patient is well, and has had no acute complaints or problems Continue with physical therapy today. Needs to walk around the nurse's station as well as do steps Plan is to go Home after hospital stay. nausea yesterday afternoon Patient denies any chest pains or shortness of breath. Objective: Vital signs in last 24 hours: Temp:  [98.2 F (36.8 C)-100.3 F (37.9 C)] 98.2 F (36.8 C) (06/10 0504) Pulse Rate:  [73-91] 84 (06/10 0504) Resp:  [16-18] 18 (06/10 0504) BP: (150-187)/(74-92) 154/76 mmHg (06/10 0504) SpO2:  [94 %-100 %] 97 % (06/10 0504) well approximated incision Heels are non tender and elevated off the bed using rolled towels Intake/Output from previous day: 06/09 0701 - 06/10 0700 In: 2033.3 [P.O.:720; I.V.:1313.3] Out: 4210 [Urine:3750; Drains:140] Intake/Output this shift:     Recent Labs  05/22/15 0516 05/23/15 0423  HGB 11.3* 11.6*    Recent Labs  05/22/15 0516 05/23/15 0423  WBC 6.4 6.3  RBC 3.51* 3.49*  HCT 33.3* 33.3*  PLT 115* 117*    Recent Labs  05/22/15 0516  NA 137  K 3.9  CL 105  CO2 25  BUN 16  CREATININE 1.26*  GLUCOSE 122*  CALCIUM 8.5*   No results for input(s): LABPT, INR in the last 72 hours.  EXAM General - Patient is Alert, Appropriate and Oriented Extremity - Neurologically intact Neurovascular intact Sensation intact distally Intact pulses distally Dorsiflexion/Plantar flexion intact Dressing - scant drainage Motor Function - intact, moving foot and toes well on exam. Patient has good gross motor strength against resistance of dorsiflexion and plantarflexion of the foot.  Past Medical History  Diagnosis Date  . Hypertension   . Hyperlipidemia   . Diabetes mellitus without complication   . Heart failure   . Cyst of brain 1983  . Macular degeneration   . Dysrhythmia   . Coronary  artery disease     stents times 3  . Arthritis   . CHF (congestive heart failure)   . Shortness of breath dyspnea     Assessment/Plan: 2 Days Post-Op Procedure(s) (LRB): TOTAL KNEE ARTHROPLASTY (Right) Active Problems:   Right knee DJD   Total knee replacement status  Estimated body mass index is 28.37 kg/(m^2) as calculated from the following:   Height as of this encounter: 6\' 3"  (1.905 m).   Weight as of this encounter: 102.967 kg (227 lb). Up with therapy D/C IV fluids Plan for discharge tomorrow Discharge home with home health  Labs: Reviewed on today's visit DVT Prophylaxis - Lovenox, Foot Pumps and TED hose Weight-Bearing as tolerated to right leg D/C O2 and Pulse OX and try on Room Air  Jon R. [place] 05/23/2015, 7:07 AM

## 2015-05-23 NOTE — Progress Notes (Signed)
Patient is  POD 2  Total right knee. Dressing intact. IV fluids infusing. Pain controlled with PRN medication. Patients complains of n/v controlled with scheduled medication. Sleeping between care.

## 2015-05-23 NOTE — Progress Notes (Signed)
Physical Therapy Treatment Patient Details Name: Daniel Luna MRN: [id number] DOB: [birth year] Today's Date: 05/23/2015    History of Present Illness Pt is a 74 y.o. male s/p R TKA 05/21/15.    PT Comments    Pt continues to progress well with functional mobility using a RW.  Pt's wife present throughout session and was verbalizing concerns about pt discharging home (pt reporting no concerns about discharging home).  Therapist encouraged pt's wife to verbalize questions/concerns for discharge home throughout treatment session.  Pt's wife only verbalized a few concerns but most concerns were regarding how pt was going to put on his compression stockings (OT present end of session to address) and that she would not assist pt at all on stairs or anything else (not even SBA) d/t concerns of getting hurt.  Pt was CGA for stairs today (recommend performing stairs tomorrow again with PT to review) and pt and pt's wife reporting they can have their neighbors come over to assist with stairs.  Pt's wife at end of session then reporting that pt was "a different" person at home and pt often became anxious and because of the anxiety, pt's wife though pt would struggle going home too soon (if pt was not comfortable enough with everything needed to go home, pt would get too anxious and end up falling).  Bed mobility, transfers (from multiple different surfaces), gait, and stairs reviewed with both pt and pt's wife extensively.  Extensive education provided (to pt and pt's wife) regarding pt's assist levels, home safety, DME use, and fall prevention.  At the end of the session pt's wife reported that she was not concerned with pt's mobility and pt's wife stated multiple times that his functional mobility "appeared" to be ok.  Will continue to progress pt with functional mobility, provide pt and family training/education, and address any questions or concerns for safe discharge home.     Follow Up Recommendations  Home  health PT;Other (comment) (assist for stairs)     Equipment Recommendations  Rolling walker with 5" wheels;3in1 (PT)    Recommendations for Other Services       Precautions / Restrictions Precautions Precautions: Fall Restrictions Weight Bearing Restrictions: Yes RLE Weight Bearing: Weight bearing as tolerated    Mobility  Bed Mobility Overal bed mobility: Modified Independent (bed flat)                Transfers Overall transfer level: Needs assistance Equipment used: Rolling walker (2 wheeled) Transfers: Sit to/from [facility] Sit to Stand: Supervision Stand pivot transfers: Supervision       General transfer comment: pt initially requiring vc's for hand and feet placement with sit to/from stand transfers (using RW) but with multiple repetitions (on bed at lowest position, bed at height similar to home set-up, and chair with armrests), pt able to verbalize and demonstrate appropriate transfer technique without cueing or assist.  Also reviewed safe transfers to sit on chair (at table) at home.  Ambulation/Gait Ambulation/Gait assistance: Supervision Ambulation Distance (Feet): 220 Feet Assistive device: Rolling walker (2 wheeled)       General Gait Details: antalgic; mild decreased stance time R LE   Stairs Stairs: Yes Stairs assistance: Min guard Stair Management:  (3 stairs (x2 trials) with B railing and 1 step (x2 trials) with RW) Number of Stairs:  (4 stairs x2 trials) General stair comments: initial vc's required for safe stepping technique and set-up and then pt able to verbalize and demonstrate appropriate technique  Wheelchair Mobility    Modified Rankin (Stroke Patients Only)       Balance                                    Cognition Arousal/Alertness: Awake/alert Behavior During Therapy: WFL for tasks assessed/performed Overall Cognitive Status: Within Functional Limits for tasks assessed                       Exercises      General Comments  Dr. Marry Guan present beginning of session.  Pt's wife verbalizing concerns about pt discharging home (MD Hooten and care management aware).      Pertinent Vitals/Pain Pain Assessment: 0-10 Pain Score: 5  Pain Location: R knee Pain Descriptors / Indicators: Sore Pain Intervention(s): Limited activity within patient's tolerance;Monitored during session  Vitals: at rest HR 65 bpm; O2 97% on room air; after activity HR 78 bpm; O2 92% on room air.     Home Living                      Prior Function            PT Goals (current goals can now be found in the care plan section) Acute Rehab PT Goals Patient Stated Goal: to go home PT Goal Formulation: With patient Time For Goal Achievement: 06/05/15 Potential to Achieve Goals: Good Progress towards PT goals: Progressing toward goals    Frequency  BID    PT Plan Current plan remains appropriate    Co-evaluation             End of Session Equipment Utilized During Treatment: Gait belt Activity Tolerance: Patient tolerated treatment well Patient left:  (Pt sitting on edge of bed with OT and wife present)     Time: 1660-6301 PT Time Calculation (min) (ACUTE ONLY): 62 min  Charges:  $Gait Training: 23-37 mins $Therapeutic Activity: 23-37 mins                    G CodesLeitha Bleak 2015/06/10, 5:28 PM  Leitha Bleak, [place]

## 2015-05-23 NOTE — Progress Notes (Signed)
Patient teaching with teach back giving to patient. Patient self administered Lovenox shot

## 2015-05-23 NOTE — Discharge Instructions (Signed)
° °TOTAL KNEE REPLACEMENT POSTOPERATIVE DIRECTIONS ° °Knee Rehabilitation, Guidelines Following Surgery  °Results after knee surgery are often greatly improved when you follow the exercise, range of motion and muscle strengthening exercises prescribed by your doctor. Safety measures are also important to protect the knee from further injury. Any time any of these exercises cause you to have increased pain or swelling in your knee joint, decrease the amount until you are comfortable again and slowly increase them. If you have problems or questions, call your caregiver or physical therapist for advice.  ° °HOME CARE INSTRUCTIONS  °Remove items at home which could result in a fall. This includes throw rugs or furniture in walking pathways.  °· Continue to use polar care unit on the knee for pain and swelling from surgery. You may notice swelling that will progress down to the foot and ankle.  This is normal after surgery.  Elevate the leg when you are not up walking on it.   °· Continue to use the breathing machine which will help keep your temperature down.  It is common for your temperature to cycle up and down following surgery, especially at night when you are not up moving around and exerting yourself.  The breathing machine keeps your lungs expanded and your temperature down. °· Do not place pillow under knee, focus on keeping the knee STRAIGHT while resting ° °DIET °You may resume your previous home diet once your are discharged from the hospital. ° °DRESSING / WOUND CARE / SHOWERING °You may start showering once staples have been removed. Change the surgical dressing as needed. ° °ACTIVITY °Walk with your walker as instructed. °Use walker as long as suggested by your caregivers. °Avoid periods of inactivity such as sitting longer than an hour when not asleep. This helps prevent blood clots.  °You may resume a sexual relationship in one month or when given the OK by your doctor.  °You may return to work once  you are cleared by your doctor.  °Do not drive a car for 6 weeks or until released by you surgeon.  °Do not drive while taking narcotics. ° °WEIGHT BEARING °Weight bearing as tolerated with assist device (walker, cane, etc) as directed, use it as long as suggested by your surgeon or therapist, typically at least 4-6 weeks. ° °POSTOPERATIVE CONSTIPATION PROTOCOL °Constipation - defined medically as fewer than three stools per week and severe constipation as less than one stool per week. ° °One of the most common issues patients have following surgery is constipation.  Even if you have a regular bowel pattern at home, your normal regimen is likely to be disrupted due to multiple reasons following surgery.  Combination of anesthesia, postoperative narcotics, change in appetite and fluid intake all can affect your bowels.  In order to avoid complications following surgery, here are some recommendations in order to help you during your recovery period. ° °Colace (docusate) - Pick up an over-the-counter form of Colace or another stool softener and take twice a day as long as you are requiring postoperative pain medications.  Take with a full glass of water daily.  If you experience loose stools or diarrhea, hold the colace until you stool forms back up.  If your symptoms do not get better within 1 week or if they get worse, check with your doctor. ° °Dulcolax (bisacodyl) - Pick up over-the-counter and take as directed by the product packaging as needed to assist with the movement of your bowels.  Take with a   full glass of water.  Use this product as needed if not relieved by Colace only.   MiraLax (polyethylene glycol) - Pick up over-the-counter to have on hand.  MiraLax is a solution that will increase the amount of water in your bowels to assist with bowel movements.  Take as directed and can mix with a glass of water, juice, soda, coffee, or tea.  Take if you go more than two days without a movement. Do not use  MiraLax more than once per day. Call your doctor if you are still constipated or irregular after using this medication for 7 days in a row.  If you continue to have problems with postoperative constipation, please contact the office for further assistance and recommendations.  If you experience "the worst abdominal pain ever" or develop nausea or vomiting, please contact the office immediatly for further recommendations for treatment.  ITCHING  If you experience itching with your medications, try taking only a single pain pill, or even half a pain pill at a time.  You can also use Benadryl over the counter for itching or also to help with sleep.   TED HOSE STOCKINGS Wear the elastic stockings on both legs for  6 weeks following surgery during the day but you may remove then at night for sleeping.  MEDICATIONS See your medication summary on the After Visit Summary that the nursing staff will review with you prior to discharge.  You may have some home medications which will be placed on hold until you complete the course of blood thinner medication.  It is important for you to complete the blood thinner medication as prescribed by your surgeon.  Continue your approved medications as instructed at time of discharge.  PRECAUTIONS If you experience chest pain or shortness of breath - call 911 immediately for transfer to the hospital emergency department.  If you develop a fever greater that 101 F, purulent drainage from wound, increased redness or drainage from wound, foul odor from the wound/dressing, or calf pain - CONTACT YOUR SURGEON.                                                   FOLLOW-UP APPOINTMENTS Make sure you keep all of your appointments after your operation with your surgeon and caregivers. You should call the office at the above phone number and make an appointment for approximately two weeks after the date of your surgery or on the date instructed by your surgeon outlined in the  "After Visit Summary".   RANGE OF MOTION AND STRENGTHENING EXERCISES  Rehabilitation of the knee is important following a knee injury or an operation. After just a few days of immobilization, the muscles of the thigh which control the knee become weakened and shrink (atrophy). Knee exercises are designed to build up the tone and strength of the thigh muscles and to improve knee motion. Often times heat used for twenty to thirty minutes before working out will loosen up your tissues and help with improving the range of motion but do not use heat for the first two weeks following surgery. These exercises can be done on a training (exercise) mat, on the floor, on a table or on a bed. Use what ever works the best and is most comfortable for you Knee exercises include:  Leg Lifts - While your knee is  still immobilized in a splint or cast, you can do straight leg raises. Lift the leg to 60 degrees, hold for 3 sec, and slowly lower the leg. Repeat 10-20 times 2-3 times daily. Perform this exercise against resistance later as your knee gets better.  Quad and Hamstring Sets - Tighten up the muscle on the front of the thigh (Quad) and hold for 5-10 sec. Repeat this 10-20 times hourly. Hamstring sets are done by pushing the foot backward against an object and holding for 5-10 sec. Repeat as with quad sets.   Leg Slides: Lying on your back, slowly slide your foot toward your buttocks, bending your knee up off the floor (only go as far as is comfortable). Then slowly slide your foot back down until your leg is flat on the floor again.  Angel Wings: Lying on your back spread your legs to the side as far apart as you can without causing discomfort.  A rehabilitation program following serious knee injuries can speed recovery and prevent re-injury in the future due to weakened muscles. Contact your doctor or a physical therapist for more information on knee rehabilitation.   IF YOU ARE TRANSFERRED TO A SKILLED REHAB  FACILITY If the patient is transferred to a skilled rehab facility following release from the hospital, a list of the current medications will be sent to the facility for the patient to continue.  When discharged from the skilled rehab facility, please have the facility set up the patient's [place] prior to being released. Also, the skilled facility will be responsible for providing the patient with their medications at time of release from the facility to include their pain medication, the muscle relaxants, and their blood thinner medication. If the patient is still at the rehab facility at time of the two week follow up appointment, the skilled rehab facility will also need to assist the patient in arranging follow up appointment in our office and any transportation needs.  MAKE SURE YOU:  Understand these instructions.  Get help right away if you are not doing well or get worse.

## 2015-05-23 NOTE — Discharge Summary (Signed)
Physician Discharge Summary  Patient ID: Daniel Luna MRN: [id number] DOB/AGE: November 27, 1941 74 y.o.  Admit date: 05/21/2015 Discharge date: 05/23/2015  Admission Diagnoses:  [place] RIGHT KNEE   Discharge Diagnoses: Patient Active Problem List   Diagnosis Date Noted  . 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  . Total knee replacement status 05/21/2015  . Encounter for screening colonoscopy 12/25/2013  . Sebaceous cyst 08/15/2013  . Infected sebaceous cyst of skin 05/23/2013    Past Medical History  Diagnosis Date  . Hypertension   . Hyperlipidemia   . Diabetes mellitus without complication   . Heart failure   . Cyst of brain 1983  . Macular degeneration   . Dysrhythmia   . Coronary artery disease     stents times 3  . Arthritis   . CHF (congestive heart failure)   . Shortness of breath dyspnea      Transfusion: Patient did receive a transfusion the first 6 hours postoperatively.   Consultants (if any):   there is no consultations on this visit.  Discharged Condition: Improved  Hospital Course: Daniel Luna is an 74 y.o. male who was admitted 05/21/2015 with a diagnosis of degenerative arthrosis of right knee and went to the operating room on 05/21/2015 and underwent the above named procedures.    Surgeries:Procedure(s): TOTAL KNEE ARTHROPLASTY on 05/21/2015  ANESTHESIA: spinal  ESTIMATED BLOOD LOSS: 50 mL  FLUIDS REPLACED: 1000 mL of crystalloid  TOURNIQUET TIME: 117 minutes  DRAINS: 2 medium drains to a reinfusion system  SOFT TISSUE RELEASES: Anterior cruciate ligament, posterior cruciate ligament, deep medial collateral ligament, patellofemoral ligament, posterolateral corner  IMPLANTS UTILIZED: DePuy PFC Sigma size 5 posterior stabilized femoral component (cemented), size 5 MBT tibial component (cemented), 38 mm 3 peg oval dome patella (cemented), and a 10 mm stabilized rotating platform polyethylene insert.  Patient tolerated the surgery well. No  complications .Patient was taken to PACU where she was stabilized and then transferred to the orthopedic floor.  Patient started on Lovenox 30 mg  q 12 hrs. Foot pumps applied bilaterally at 80 mm hgb. Heels elevated off bed with rolled towels. No evidence of DVT. Calves non tender. Negative [place]. Physical therapy started on day #1 for gait training and transfer with OT starting on  day #1 for ADL and assisted devices. Patient has done well with therapy. Ambulated greater than 200 feet upon being discharged. Was able to work 4 steps. Was independent with bed to chair transfers.  Patient's IV , foley 1 day #1 and hemovac was d/c on day #2.   He was given perioperative antibiotics:  Anti-infectives    Start     Dose/Rate Route Frequency Ordered Stop   05/21/15 1245  ceFAZolin (ANCEF) IVPB 2 g/50 mL premix     2 g 100 mL/hr over 30 Minutes Intravenous Every 6 hours 05/21/15 1244 05/22/15 0633   05/21/15 0625  ceFAZolin (ANCEF) 2-3 GM-% IVPB SOLR    Comments:  Phillips Grout: cabinet override      05/21/15 0625 05/21/15 1829   05/21/15 0609  ceFAZolin (ANCEF) IVPB 2 g/50 mL premix     2 g 100 mL/hr over 30 Minutes Intravenous On call to O.R. 05/21/15 6834 05/21/15 0802    .  He was given sequential compression devices, early ambulation, and Lovenox and TED stockings for DVT prophylaxis.  He benefited maximally from the hospital stay and there were no complications.    Recent vital signs:  Filed Vitals:   05/23/15 0741  BP: 148/75  Pulse: 84  Temp:   Resp:     Recent laboratory studies:  Lab Results  Component Value Date   HGB 11.6* 05/23/2015   HGB 11.3* 05/22/2015   HGB 14.1 05/07/2015   Lab Results  Component Value Date   WBC 6.3 05/23/2015   PLT 117* 05/23/2015   Lab Results  Component Value Date   INR 0.99 05/07/2015   Lab Results  Component Value Date   NA 137 05/22/2015   K 3.9 05/22/2015   CL 105 05/22/2015   CO2 25 05/22/2015   BUN 16 05/22/2015    CREATININE 1.26* 05/22/2015   GLUCOSE 122* 05/22/2015    Discharge Medications:     Medication List    STOP taking these medications        aspirin 81 MG tablet      TAKE these medications        ACCU-CHEK FASTCLIX LANCETS Misc     ACCU-CHEK SMARTVIEW test strip  Generic drug:  glucose blood     carvedilol 12.5 MG tablet  Commonly known as:  COREG  Take 12.5 mg by mouth 2 (two) times daily with a meal.     enoxaparin 40 MG/0.4ML injection  Commonly known as:  LOVENOX  Inject 0.4 mLs (40 mg total) into the skin daily.     furosemide 20 MG tablet  Commonly known as:  LASIX  Take 20 mg by mouth.     lisinopril 40 MG tablet  Commonly known as:  PRINIVIL,ZESTRIL  Take 40 mg by mouth daily.     metFORMIN 1000 MG tablet  Commonly known as:  GLUCOPHAGE  Take 1,000 mg by mouth 2 (two) times daily with a meal.     oxyCODONE 5 MG immediate release tablet  Commonly known as:  Oxy IR/ROXICODONE  Take 1-2 tablets (5-10 mg total) by mouth every 3 (three) hours as needed for breakthrough pain.     polyethylene glycol powder powder  Commonly known as:  GLYCOLAX/MIRALAX  255 grams one bottle for colonoscopy prep     potassium chloride SA 20 MEQ tablet  Commonly known as:  K-DUR,KLOR-CON  Take 0.5 tablets by mouth daily.     prasugrel 10 MG Tabs tablet  Commonly known as:  EFFIENT  Take 10 mg by mouth daily.     simvastatin 80 MG tablet  Commonly known as:  ZOCOR  Take 80 mg by mouth at bedtime.     traMADol 50 MG tablet  Commonly known as:  ULTRAM  Take 1-2 tablets (50-100 mg total) by mouth every 4 (four) hours as needed for moderate pain.     VISION VITAMINS PO  Take 1 tablet by mouth 2 (two) times daily.        Diagnostic Studies: Dg Knee Right Port  05/21/2015   CLINICAL DATA:  RIGHT total knee arthroplasty. Postoperative radiographs.  EXAM: PORTABLE RIGHT KNEE - 1-2 VIEW  COMPARISON:  None.  FINDINGS: New uncomplicated RIGHT 3 part total knee arthroplasty.  Expected postsurgical changes in the soft tissues. Atherosclerosis. Surgical drain in the anterior soft tissues.  IMPRESSION: Uncomplicated new RIGHT 3 part total knee arthroplasty.   Electronically Signed   By: Dereck Ligas M.D.   On: 05/21/2015 12:43    Disposition: Final discharge disposition not confirmed      Discharge Instructions    Diet - low sodium heart healthy    Complete by:  As directed      Increase activity slowly  Complete by:  As directed            Follow-up Information    Follow up with Feliberto Gottron, PA-C On 06/05/2015.   Specialties:  Orthopedic Surgery, Emergency Medicine   Why:  Appointment is at 10:45   Contact information:   [place] [address] [phone]       Follow up with Dereck Leep, MD On 07/03/2015.   Specialty:  Orthopedic Surgery   Why:  Appointment is at 10:45   Contact information:   [place] [address] [phone]       Please follow up.   Why:  Patient should already have an appointment scheduled for 2 weeks. call for time and date       Signed: WOLFE,JON R. 05/23/2015, 7:44 AM

## 2015-05-23 NOTE — Progress Notes (Signed)
Physical Therapy Treatment Patient Details Name: Daniel Luna MRN: [id number] DOB: 01/26/1941 Today's Date: 05/23/2015    History of Present Illness Pt is a 74 y.o. male s/p R TKA 05/21/15.    PT Comments    Pt able to ambulate around nurses station this AM using RW.  Pt requires some vc's for sit to/from stand transfers with RW but overall pt is progressing well with functional mobility.  Will trial stairs this afternoon.    Follow Up Recommendations  Home health PT;Supervision - Intermittent     Equipment Recommendations  Rolling walker with 5" wheels;3in1 (PT)    Recommendations for Other Services       Precautions / Restrictions Precautions Precautions: Fall Restrictions Weight Bearing Restrictions: Yes RLE Weight Bearing: Weight bearing as tolerated    Mobility  Bed Mobility Overal bed mobility: Needs Assistance Bed Mobility: Supine to Sit     Supine to sit: Supervision        Transfers Overall transfer level: Needs assistance Equipment used: Rolling walker (2 wheeled) Transfers: Sit to/from [employer] Transfers Sit to Stand: Min guard;Min assist Stand pivot transfers: Min guard       General transfer comment: vc's required for hand and feet placement  Ambulation/Gait Ambulation/Gait assistance: Min guard Ambulation Distance (Feet): 200 Feet Assistive device: Rolling walker (2 wheeled)       General Gait Details: antalgic; decreased stance time R LE   Stairs            Wheelchair Mobility    Modified Rankin (Stroke Patients Only)       Balance                                    Cognition Arousal/Alertness: Awake/alert Behavior During Therapy: WFL for tasks assessed/performed Overall Cognitive Status: Within Functional Limits for tasks assessed                      Exercises Total Joint Exercises Goniometric ROM: R knee extension 8 degrees short of neutral (semi-supine); R knee flexion 90 degrees  (sitting)  Performed semi-supine B LE therapeutic exercise x 15 reps:  Ankle pumps (AROM B LE's); quad sets x3 second holds (AROM B LE's); SAQ's (AROM R; AROM L); heelslides (AAROM R; AROM L), hip abd/adduction (AROM R; AROM L), and SLR (AROM R; AROM L).  Pt required minimal vc's and tactile cues for correct technique with exercises.     General Comments  Nursing cleared pt for participation in PT.  Pt agreeable to PT session.      Pertinent Vitals/Pain Pain Assessment: 0-10 Pain Score: 5  Pain Location: R knee Pain Descriptors / Indicators: Constant Pain Intervention(s): Limited activity within patient's tolerance;Monitored during session;Premedicated before session;Repositioned;Ice applied  Vitals stable and WFL throughout treatment session.     Home Living                      Prior Function            PT Goals (current goals can now be found in the care plan section) Acute Rehab PT Goals Patient Stated Goal: To go home PT Goal Formulation: With patient Time For Goal Achievement: 06/05/15 Potential to Achieve Goals: Good Progress towards PT goals: Progressing toward goals    Frequency  BID    PT Plan Current plan remains appropriate    Co-evaluation  End of Session Equipment Utilized During Treatment: Gait belt Activity Tolerance: Patient tolerated treatment well Patient left: in chair;with call bell/phone within reach;with chair alarm set;with SCD's reapplied (B heels elevated via towel roll; polar care applied)     Time: 3845-3646 PT Time Calculation (min) (ACUTE ONLY): 28 min  Charges:  $Gait Training: 8-22 mins $Therapeutic Exercise: 8-22 mins                    G CodesLeitha Bleak 2015/06/15, 1:19 PM  Leitha Bleak, [place]

## 2015-05-23 NOTE — Progress Notes (Signed)
Occupational Therapy Treatment Patient Details Name: Daniel Luna MRN: [id number] DOB: 03-Jul-1941 Today's Date: 05/23/2015    History of present illness Pt is a 74 y.o. male s/p R TKA 05/21/15.   OT comments  Pt seen with overlap of a few minutes with PT to discuss issues about pt's wife concerned about how much pt can complete safely and that he gets anxious.  All questions answered and use of ted hose sock aid utilized and pt able to use with min assist and cues.  His wife felt better that he was able to complete donning and doffing ted hose since she was concerned she would not be able to do this for him.  He needs to practice and wants to get dressed tomorrow if he is able to go home which OT will work on.  He is able to complete LB dressing without AD for LLE sitting EOB.  Given instructions to wife on where to order ted hose sock aid and step by step directions on how to use.  Also have her instructions on dimensions of bedside commode.   Follow Up Recommendations       Equipment Recommendations       Recommendations for Other Services      Precautions / Restrictions Precautions Precautions: Fall Restrictions Weight Bearing Restrictions: Yes RLE Weight Bearing: Weight bearing as tolerated       Mobility Bed Mobility Overal bed mobility: Needs Assistance Bed Mobility: Supine to Sit     Supine to sit: Supervision        Transfers Overall transfer level: Needs assistance Equipment used: Rolling walker (2 wheeled) Transfers: Sit to/from [employer] Sit to Stand: Min guard;Min assist Stand pivot transfers: Min guard       General transfer comment: vc's required for hand and feet placement    Balance                                   ADL                                         General ADL Comments: Spoke to Dr Marry Guan prior to seeing patient and he stated that pt's wife was really anxious about him going home and she  still wants him to go to rehab.  Practiced how to don and doff ted hose with wire ted hose donner with min assist and cues.  Overlapped with PT for a few minutes to discuss needs and ensure pt and his wife were educated  and felt more confident in ADL independence.      Vision                     Perception     Praxis      Cognition   Behavior During Therapy: WFL for tasks assessed/performed Overall Cognitive Status: Within Functional Limits for tasks assessed                       Extremity/Trunk Assessment               Exercises Total Joint Exercises Goniometric ROM: R knee extension 8 degrees short of neutral (semi-supine); R knee flexion 90 degrees (sitting)   Shoulder Instructions       General Comments  Pertinent Vitals/ Pain       Pain Assessment: 0-10 Pain Score: 5  Pain Location: R knee Pain Descriptors / Indicators: Sore Pain Intervention(s): Limited activity within patient's tolerance;Premedicated before session  Home Living                                          Prior Functioning/Environment              Frequency       Progress Toward Goals  OT Goals(current goals can now be found in the care plan section)  Progress towards OT goals: Progressing toward goals  Acute Rehab OT Goals Patient Stated Goal: to go home  Plan      Co-evaluation                 End of Session Equipment Utilized During Treatment:  (ted hose sock aid)   Activity Tolerance Patient tolerated treatment well   Patient Left in bed;with bed alarm set;with family/visitor present   Nurse Communication          Time: 2449-7530 OT Time Calculation (min): 40 min  Charges: OT General Charges $OT Visit: 1 Procedure OT Treatments $Self Care/Home Management : 38-52 mins  Keisha Amer 05/23/2015, 4:25 PM    Chrys Racer, OTR/L

## 2015-05-23 NOTE — Progress Notes (Signed)
Post-op day 2 -s/p right total knee, alert and oriented, iv site intact - left inner forearm, up with physical therapy x2, had bowel movement today. Planning to go home with home health. Tolerated diet well- Low sodium/heart healthy.

## 2015-05-24 LAB — CBC
HCT: 29 % — ABNORMAL LOW (ref 40.0–52.0)
Hemoglobin: 10.1 g/dL — ABNORMAL LOW (ref 13.0–18.0)
MCH: 32.7 pg (ref 26.0–34.0)
MCHC: 34.8 g/dL (ref 32.0–36.0)
MCV: 94.1 fL (ref 80.0–100.0)
Platelets: 104 10*3/uL — ABNORMAL LOW (ref 150–440)
RBC: 3.08 MIL/uL — ABNORMAL LOW (ref 4.40–5.90)
RDW: 13 % (ref 11.5–14.5)
WBC: 5.5 10*3/uL (ref 3.8–10.6)

## 2015-05-24 LAB — GLUCOSE, CAPILLARY: Glucose-Capillary: 145 mg/dL — ABNORMAL HIGH (ref 65–99)

## 2015-05-24 NOTE — Progress Notes (Signed)
Occupational Therapy Treatment Patient Details Name: Daniel Luna MRN: [id number] DOB: [birth year] Today's Date: 05/24/2015    History of present illness Pt is a 74 y.o. male s/p R TKA 05/21/15.   OT comments  Patient was just finishing PT session when OT arrived. Patient agreed to perform dressing tasks for session. Toileting performed with Supervision to [employer]. Patient able to dress sitting edge of bed with no AE needed for pants, undergarments, socks, or shoes. TED hose not performed at this time, but educated on how to perform with versus without AE. Patient demonstrated good safety awareness during dressing tasks, and was able to perform with supervision to MIN guard for sit/stand. Able to use bed to assist in positioning right knee for donn/doffing socks and shoes. Patient left up in recliner with ice in place and alarm set.   Follow Up Recommendations  No OT follow up    Equipment Recommendations  Toilet rise with handles    Recommendations for Other Services      Precautions / Restrictions Precautions Precautions: Fall Restrictions Weight Bearing Restrictions: No RLE Weight Bearing: Weight bearing as tolerated       Mobility Bed Mobility Overal bed mobility: Modified Independent Bed Mobility: Supine to Sit;Sit to Supine     Supine to sit: Supervision Sit to supine: Supervision   General bed mobility comments: cued and removed ice covering  Transfers Overall transfer level: Needs assistance Equipment used: Rolling walker (2 wheeled) Transfers: Sit to/from Stand Sit to Stand: Supervision;Min guard Stand pivot transfers: Min guard       General transfer comment: pt used good hand placement and remembered to reach for surface to sit but wants to use front of walker to stand    Balance Overall balance assessment: Needs assistance           Standing balance-Leahy Scale: Fair                     ADL Overall ADL's : Independent                                        General ADL Comments: Patient seen immediately after PT. Wife not present, however, patient was able to perform lower body dressing with supervision and no AE. Toileting with supervision. Patient demonstrated good safety awareness during ADL tasks.      Vision                     Perception     Praxis      Cognition   Behavior During Therapy: [facility] for tasks assessed/performed Overall Cognitive Status: Within Functional Limits for tasks assessed                       Extremity/Trunk Assessment               Exercises Total Joint Exercises Ankle Circles/Pumps: AROM;Both;5 reps Quad Sets: AROM;Both;10 reps Gluteal Sets: AROM;Both;10 reps Heel Slides: AROM;Both;10 reps Straight Leg Raises: AROM;Both;10 reps   Shoulder Instructions       General Comments      Pertinent Vitals/ Pain       Pain Assessment: 0-10 Pain Score: 5  Pain Location: R knee Pain Intervention(s): Monitored during session;Limited activity within patient's tolerance;Ice applied  Home Living  Prior Functioning/Environment              Frequency Min 1X/week     Progress Toward Goals  OT Goals(current goals can now be found in the care plan section)  Progress towards OT goals: Progressing toward goals  Acute Rehab OT Goals Patient Stated Goal: to go home  Plan Discharge plan remains appropriate    Co-evaluation                 End of Session     Activity Tolerance Patient tolerated treatment well   Patient Left in chair;with call bell/phone within reach;with chair alarm set   Nurse Communication Other (comment) (ADL status)        Time: 1005-1035 OT Time Calculation (min): 30 min  Charges: OT General Charges $OT Visit: 1 Procedure OT Treatments $Self Care/Home Management : 23-37 mins  Daniel Luna L 05/24/2015, 11:57 AM

## 2015-05-24 NOTE — Progress Notes (Signed)
Patient is POD 3 Total right knee. Dressing intact. Patient denies pain. Patient ambulating without distress. VSS. No complaints of n/v. Sleeping between care.

## 2015-05-24 NOTE — Progress Notes (Signed)
Physical Therapy Treatment Patient Details Name: Daniel Luna MRN: [id number] DOB: February 06, 1941 Today's Date: 05/24/2015    History of Present Illness Pt is a 74 y.o. male s/p R TKA 05/21/15.    PT Comments    Pt was seen for treatment of TKA with increased comfort during mobility today and good performance of there ex.  He is leaving to go home with wife to assist and is having HHPT follow, very good effort with all exercises and gait.  Plan is still very appropriate for pt  Follow Up Recommendations  Home health PT;Other (comment)     Equipment Recommendations  Rolling walker with 5" wheels;3in1 (PT)    Recommendations for Other Services       Precautions / Restrictions Precautions Precautions: Fall Restrictions Weight Bearing Restrictions: No RLE Weight Bearing: Weight bearing as tolerated    Mobility  Bed Mobility Overal bed mobility: Modified Independent Bed Mobility: Supine to Sit;Sit to Supine     Supine to sit: Supervision Sit to supine: Supervision   General bed mobility comments: cued and removed ice covering  Transfers Overall transfer level: Needs assistance Equipment used: Rolling walker (2 wheeled) Transfers: Sit to/from [employer] Sit to Stand: Min assist Stand pivot transfers: Min guard       General transfer comment: pt used good hand placement and remembered to reach for surface to sit but wants to use front of walker to stand  Ambulation/Gait Ambulation/Gait assistance: Supervision Ambulation Distance (Feet): 300 Feet Assistive device: Rolling walker (2 wheeled) Gait Pattern/deviations: Step-through pattern;Trunk flexed;Wide base of support Gait velocity: normal to reduced Gait velocity interpretation: Below normal speed for age/gender General Gait Details: upright with good control RLE but minimal heel strike   Stairs            Wheelchair Mobility    Modified Rankin (Stroke Patients Only)       Balance  Overall balance assessment: Needs assistance           Standing balance-Leahy Scale: Fair                      Cognition Arousal/Alertness: Awake/alert Behavior During Therapy: WFL for tasks assessed/performed Overall Cognitive Status: Within Functional Limits for tasks assessed                      Exercises Total Joint Exercises Ankle Circles/Pumps: AROM;Both;5 reps Quad Sets: AROM;Both;10 reps Gluteal Sets: AROM;Both;10 reps Heel Slides: AROM;Both;10 reps Straight Leg Raises: AROM;Both;10 reps    General Comments        Pertinent Vitals/Pain Pain Assessment: 0-10 Pain Score: 5  Pain Location: R knee Pain Intervention(s): Monitored during session;Premedicated before session;Ice applied    Home Living                      Prior Function            PT Goals (current goals can now be found in the care plan section) Acute Rehab PT Goals Patient Stated Goal: to go home Progress towards PT goals: Progressing toward goals    Frequency  BID    PT Plan Current plan remains appropriate    Co-evaluation             End of Session Equipment Utilized During Treatment: Gait belt Activity Tolerance: Patient tolerated treatment well;No increased pain Patient left: in bed;with call bell/phone within reach (polar care on and awaiting preparation to leave)  Time: 0051-1021 PT Time Calculation (min) (ACUTE ONLY): 25 min  Charges:  $Gait Training: 8-22 mins $Therapeutic Exercise: 8-22 mins                    G Codes:      Ramond Dial May 28, 2015, 10:59 AM   Mee Hives, PT MS Acute Rehab Dept. Number: [facility] [phone] and [place] [phone]

## 2015-05-24 NOTE — Progress Notes (Signed)
   Subjective: 3 Days Post-Op Procedure(s) (LRB): TOTAL KNEE ARTHROPLASTY (Right) Patient reports pain as 2 on 0-10 scale.   Patient is well, and has had no acute complaints or problems Continue with physical therapy today. Plan is to go Home today with HHPT  Objective: Vital signs in last 24 hours: Temp:  [98.1 F (36.7 C)-100.2 F (37.9 C)] 98.7 F (37.1 C) (06/11 0751) Pulse Rate:  [83-93] 83 (06/11 0751) Resp:  [18] 18 (06/11 0751) BP: (124-160)/(58-73) 124/70 mmHg (06/11 0751) SpO2:  [94 %-98 %] 94 % (06/11 0751) well approximated incision Heels are non tender and elevated off the bed using rolled towels Intake/Output from previous day: 06/10 0701 - 06/11 0700 In: 240 [P.O.:240] Out: 3150 [Urine:3150] Intake/Output this shift:     Recent Labs  05/22/15 0516 05/23/15 0423 05/24/15 0334  HGB 11.3* 11.6* 10.1*    Recent Labs  05/23/15 0423 05/24/15 0334  WBC 6.3 5.5  RBC 3.49* 3.08*  HCT 33.3* 29.0*  PLT 117* 104*    Recent Labs  05/22/15 0516  NA 137  K 3.9  CL 105  CO2 25  BUN 16  CREATININE 1.26*  GLUCOSE 122*  CALCIUM 8.5*   No results for input(s): LABPT, INR in the last 72 hours.  EXAM General - Patient is Alert, Appropriate and Oriented Extremity - Neurologically intact Neurovascular intact Sensation intact distally Intact pulses distally Dorsiflexion/Plantar flexion intact Dressing - scant drainage Motor Function - intact, moving foot and toes well on exam. Patient has good gross motor strength against resistance of dorsiflexion and plantarflexion of the foot.  Past Medical History  Diagnosis Date  . Hypertension   . Hyperlipidemia   . Diabetes mellitus without complication   . Heart failure   . Cyst of brain 1983  . Macular degeneration   . Dysrhythmia   . Coronary artery disease     stents times 3  . Arthritis   . CHF (congestive heart failure)   . Shortness of breath dyspnea     Assessment/Plan: 3 Days Post-Op  Procedure(s) (LRB): TOTAL KNEE ARTHROPLASTY (Right) Active Problems:   Right knee DJD   Total knee replacement status  Estimated body mass index is 28.37 kg/(m^2) as calculated from the following:   Height as of this encounter: 6\' 3"  (1.905 m).   Weight as of this encounter: 102.967 kg (227 lb). Dc home with HHPT today Follow up with [facility] in 2 weeks  Labs: Reviewed on today's visit DVT Prophylaxis - Lovenox, Foot Pumps and TED hose Weight-Bearing as tolerated to right leg D/C O2 and Pulse OX and try on [place] PA-C [place] 05/24/2015, 8:05 AM

## 2015-08-25 DIAGNOSIS — I493 Ventricular premature depolarization: Secondary | ICD-10-CM | POA: Insufficient documentation

## 2016-06-02 ENCOUNTER — Ambulatory Visit: Payer: Medicare Other | Admitting: Cardiology

## 2016-07-12 ENCOUNTER — Encounter: Payer: Self-pay | Admitting: Cardiovascular Disease

## 2016-07-12 ENCOUNTER — Ambulatory Visit (INDEPENDENT_AMBULATORY_CARE_PROVIDER_SITE_OTHER): Payer: Medicare Other | Admitting: Cardiovascular Disease

## 2016-07-12 VITALS — BP 140/88 | HR 79 | Ht 75.0 in | Wt 240.0 lb

## 2016-07-12 DIAGNOSIS — Z955 Presence of coronary angioplasty implant and graft: Secondary | ICD-10-CM | POA: Insufficient documentation

## 2016-07-12 DIAGNOSIS — I25118 Atherosclerotic heart disease of native coronary artery with other forms of angina pectoris: Secondary | ICD-10-CM | POA: Insufficient documentation

## 2016-07-12 DIAGNOSIS — E785 Hyperlipidemia, unspecified: Secondary | ICD-10-CM | POA: Insufficient documentation

## 2016-07-12 DIAGNOSIS — I255 Ischemic cardiomyopathy: Secondary | ICD-10-CM | POA: Insufficient documentation

## 2016-07-12 DIAGNOSIS — I2510 Atherosclerotic heart disease of native coronary artery without angina pectoris: Secondary | ICD-10-CM | POA: Diagnosis not present

## 2016-07-12 DIAGNOSIS — I509 Heart failure, unspecified: Secondary | ICD-10-CM | POA: Diagnosis not present

## 2016-07-12 DIAGNOSIS — I25119 Atherosclerotic heart disease of native coronary artery with unspecified angina pectoris: Secondary | ICD-10-CM | POA: Insufficient documentation

## 2016-07-12 MED ORDER — FUROSEMIDE 20 MG PO TABS: 20.0000 mg | ORAL_TABLET | Freq: Every day | ORAL | 6 refills | Status: DC

## 2016-07-12 MED ORDER — SIMVASTATIN 80 MG PO TABS: 80.0000 mg | ORAL_TABLET | Freq: Every day | ORAL | 3 refills | Status: DC

## 2016-07-12 MED ORDER — POTASSIUM CHLORIDE CRYS ER 20 MEQ PO TBCR: 10.0000 meq | EXTENDED_RELEASE_TABLET | Freq: Every day | ORAL | 3 refills | Status: DC

## 2016-07-12 MED ORDER — LISINOPRIL 40 MG PO TABS: 40.0000 mg | ORAL_TABLET | Freq: Every day | ORAL | 3 refills | Status: DC

## 2016-07-12 MED ORDER — CARVEDILOL 12.5 MG PO TABS: 12.5000 mg | ORAL_TABLET | Freq: Two times a day (BID) | ORAL | 3 refills | Status: DC

## 2016-07-12 NOTE — Progress Notes (Signed)
Cardiology Office Note  Date:  07/12/2016   ID:  EDDI TATAR, DOB 1941-11-25, MRN [id number]  PCP:  Idelle Crouch, MD   Chief Complaint  Patient presents with  . Other    Self ref to establish care for CAD; former cardiologist moved away (Dr. Alanda Amass from [facility]). Meds reviewed by the patient verbally. Denies chest pain or shortness of breath.     HPI:  Daniel Luna is a very pleasant 75 year old gentleman with history of smoking who stopped 27 years ago, coronary artery disease with stent 3 placed in 2013 at [facility] to the proximal to mid LAD, mid RCA and distal RCA, hypertension, hyperlipidemia who has cardiomyopathy, ejection fraction 30-35%, who presents by referral from Dr. Doy Hutching to establish care in our office for his coronary disease  Wife presents with him today, reports that he has chronic shortness of breath on exertion He does go to the gym, does exercise. No significant change in his symptoms Does some aerobic, light weights  In general reports that he is stable Tolerating simvastatin 80 mg daily, LDL in the 90 range Poor diet recently, previous LDL in the 80 range  Takes Lasix with potassium daily  History of total knee replacement on the right, reports that he needs a knee replacement on the left  Long history of smoking but did stopped more than 27 years ago Echocardiogram reviewed through the [place] system documenting ejection fraction 30-35% in 2016  He reports having stress test 1 year ago. Results reviewed October 2016, Low risk study small apical defect, ejection fraction 36%  He reports having shortness of breath prior to previous stents in 2013 Records reviewed from previous cardiologist Stent to 70% mid RCA lesion 3.5 x 15 mm xience, Stent to 80% distal RCA 3.5 x 15 mm xience Stent to mid LAD 80% lesion 3.0 x 23 mm xience  EKG on today's visit shows normal sinus rhythm with rate 80 bpm, right bundle branch block, left anterior fascicular block, rare PVCs   PMH:    has a past medical history of Arthritis; CHF (congestive heart failure) ([place]); Coronary artery disease; Cyst of brain (1983); Diabetes mellitus without complication ([place]); Dysrhythmia; Heart failure ([place]); Hyperlipidemia; Hypertension; Ischemic cardiomyopathy; Macular degeneration; and Shortness of breath dyspnea.  PSH:    Past Surgical History:  Procedure Laterality Date  . APPENDECTOMY  1997  . CARDIAC CATHETERIZATION    . COLONOSCOPY  2005   4 polyps  . crainectomy    . crainoplasty    . CYST EXCISION  08-06-13   back  . cyst removal brain  1983   benign dermatoid cyst  . EYE SURGERY Bilateral 2008, 2012  . stents  2013   3 placed  . TOTAL KNEE ARTHROPLASTY Right 05/21/2015   Procedure: TOTAL KNEE ARTHROPLASTY;  Surgeon: Dereck Leep, MD;  Location: [facility] ORS;  Service: Orthopedics;  Laterality: Right;    Current Outpatient Prescriptions  Medication Sig Dispense Refill  . ACCU-CHEK FASTCLIX LANCETS MISC     . ACCU-CHEK SMARTVIEW test strip     . aspirin EC 81 MG tablet Take 81 mg by mouth daily.     . carvedilol (COREG) 12.5 MG tablet Take 12.5 mg by mouth 2 (two) times daily with a meal.    . furosemide (LASIX) 20 MG tablet Take 20 mg by mouth.    Marland Kitchen lisinopril (PRINIVIL,ZESTRIL) 40 MG tablet Take 40 mg by mouth daily.    . meloxicam (MOBIC) 7.5 MG tablet Take 7.5  mg by mouth daily.     . metFORMIN (GLUCOPHAGE) 1000 MG tablet Take 1,000 mg by mouth 2 (two) times daily with a meal.    . Multiple Vitamins-Minerals (VISION VITAMINS PO) Take 1 tablet by mouth 2 (two) times daily.    . potassium chloride SA (K-DUR,KLOR-CON) 20 MEQ tablet Take 0.5 tablets by mouth daily.     . simvastatin (ZOCOR) 80 MG tablet Take 80 mg by mouth at bedtime.     No current facility-administered medications for this visit.      Allergies:   Iodinated diagnostic agents   Social History:  The patient  reports that he quit smoking about 27 years ago. His smoking use included Cigarettes. He has a 54.00  pack-year smoking history. He has never used smokeless tobacco. He reports that he drinks alcohol. He reports that he does not use drugs.   Family History:   family history includes Throat cancer in his mother.    Review of Systems: Review of Systems  Constitutional: Negative.   Respiratory: Positive for shortness of breath.   Cardiovascular: Negative.   Gastrointestinal: Negative.   Musculoskeletal: Negative.   Neurological: Negative.   Psychiatric/Behavioral: Negative.   All other systems reviewed and are negative.    PHYSICAL EXAM: VS:  BP 140/88 (BP Location: Left Arm, Patient Position: Sitting, Cuff Size: Normal)   Pulse 79   Ht 6\' 3"  (1.905 m)   Wt 240 lb (108.9 kg)   BMI 30.00 kg/m  , BMI Body mass index is 30 kg/m. GEN: Well nourished, well developed, in no acute distress  HEENT: normal  Neck: no JVD, carotid bruits, or masses Cardiac: RRR; no murmurs, rubs, or gallops,no edema  Respiratory:  clear to auscultation bilaterally, normal work of breathing GI: soft, nontender, nondistended, + BS MS: no deformity or atrophy  Skin: warm and dry, no rash Neuro:  Strength and sensation are intact Psych: euthymic mood, full affect    Recent Labs: LDL 96  Lipid Panel No results found for: CHOL, HDL, LDLCALC, TRIG  LDL is above  Wt Readings from Last 3 Encounters:  07/12/16 240 lb (108.9 kg)  05/21/15 227 lb (103 kg)  05/07/15 233 lb (105.7 kg)       ASSESSMENT AND PLAN:  Atherosclerosis of native coronary artery without angina pectoris, unspecified whether native or transplanted heart - Plan: EKG 12-Lead Long discussion concerning his shortness of breath. Suspect this is secondary to obesity and deconditioning. No significant change in his symptoms. Recommended if symptoms get worse that he call our office for stress test He did have stress test 1 year ago Long discussion concerning lipid management  Congestive heart failure, unspecified congestive heart  failure chronicity, unspecified congestive heart failure type ([place]) - Plan: EKG 12-Lead Long discussion concerning symptoms of CHF. Ejection fraction is low, we did recommend he consider starting entresto and stopping lisinopril. He will research the medication and call us back  History of coronary artery stent placement 3 drug-eluting stents placed in 2013, currently on aspirin Took himself off effient  Hyperlipidemia Long discussion concerning LDL goal less than 70 His is currently in the 90 range  Cardiomyopathy, ischemic Previous ejection fraction 30-35% in 2016 on echocardiogram Results discussed with him in detail We did discuss changing him to entresto and holding the lisinopril. He would like to research the medication  Shortness of breath Likely secondary to obesity, deconditioning, previous smoking history If symptoms get worse, recommended he call our office for stress test  Total encounter time more than 45 minutes  Greater than 50% was spent in counseling and coordination of care with the patient    Disposition:   F/U  6 months   Orders Placed This Encounter  Procedures  . EKG 12-Lead     Signed, Esmond Plants, M.D., Ph.D. 07/12/2016  [place], [place]

## 2016-07-12 NOTE — Addendum Note (Signed)
Addended by: Dede Query R on: 07/12/2016 02:08 PM   Modules accepted: Orders

## 2016-07-12 NOTE — Patient Instructions (Addendum)
Medication Instructions:   Please research zetia 10 mg daily for cholesterol, We would combine this pill with the simvastatin  Research entresto twice a day This would take the place of the lisinopril    Follow-Up: It was a pleasure seeing you in the office today. Please call us if you have new issues that need to be addressed before your next appt.  [phone]  Your physician wants you to follow-up in: 6 months.  You will receive a reminder letter in the mail two months in advance. If you don't receive a letter, please call our office to schedule the follow-up appointment.  If you need a refill on your cardiac medications before your next appointment, please call your pharmacy.

## 2016-07-29 ENCOUNTER — Other Ambulatory Visit: Payer: Self-pay | Admitting: Cardiovascular Disease

## 2016-08-18 ENCOUNTER — Other Ambulatory Visit: Payer: Self-pay | Admitting: Orthopedic Surgery

## 2016-08-18 DIAGNOSIS — M1712 Unilateral primary osteoarthritis, left knee: Secondary | ICD-10-CM

## 2016-08-23 ENCOUNTER — Ambulatory Visit
Admission: RE | Admit: 2016-08-23 | Discharge: 2016-08-23 | Disposition: A | Discharge status: Home / Self Care | Payer: Medicare Other | Source: Ambulatory Visit | Attending: Orthopedic Surgery | Admitting: Orthopedic Surgery

## 2016-08-23 DIAGNOSIS — M1712 Unilateral primary osteoarthritis, left knee: Secondary | ICD-10-CM | POA: Diagnosis present

## 2016-09-08 ENCOUNTER — Encounter
Admission: RE | Admit: 2016-09-08 | Discharge: 2016-09-08 | Disposition: A | Discharge status: Home / Self Care | Payer: Medicare Other | Source: Ambulatory Visit | Attending: Orthopedic Surgery | Admitting: Orthopedic Surgery

## 2016-09-08 DIAGNOSIS — Z01812 Encounter for preprocedural laboratory examination: Secondary | ICD-10-CM | POA: Diagnosis not present

## 2016-09-08 LAB — URINALYSIS COMPLETE WITH MICROSCOPIC (ARMC ONLY)
BACTERIA UA: NONE SEEN
Bilirubin Urine: NEGATIVE
GLUCOSE, UA: NEGATIVE mg/dL
HGB URINE DIPSTICK: NEGATIVE
Ketones, ur: NEGATIVE mg/dL
LEUKOCYTES UA: NEGATIVE
Nitrite: NEGATIVE
PROTEIN: NEGATIVE mg/dL
RBC / HPF: NONE SEEN RBC/hpf (ref 0–5)
Specific Gravity, Urine: 1.006 (ref 1.005–1.030)
pH: 5 (ref 5.0–8.0)

## 2016-09-08 LAB — BASIC METABOLIC PANEL
Anion gap: 6 (ref 5–15)
BUN: 24 mg/dL — AB (ref 6–20)
CHLORIDE: 104 mmol/L (ref 101–111)
CO2: 29 mmol/L (ref 22–32)
CREATININE: 1.4 mg/dL — AB (ref 0.61–1.24)
Calcium: 9.9 mg/dL (ref 8.9–10.3)
GFR, EST AFRICAN AMERICAN: 56 mL/min — AB (ref >60)
GFR, EST NON AFRICAN AMERICAN: 48 mL/min — AB (ref >60)
Glucose, Bld: 108 mg/dL — ABNORMAL HIGH (ref 65–99)
Potassium: 4.5 mmol/L (ref 3.5–5.1)
SODIUM: 139 mmol/L (ref 135–145)

## 2016-09-08 LAB — CBC
HCT: 39.5 % — ABNORMAL LOW (ref 40.0–52.0)
Hemoglobin: 14.2 g/dL (ref 13.0–18.0)
MCH: 33.5 pg (ref 26.0–34.0)
MCHC: 36.1 g/dL — ABNORMAL HIGH (ref 32.0–36.0)
MCV: 92.9 fL (ref 80.0–100.0)
PLATELETS: 144 10*3/uL — AB (ref 150–440)
RBC: 4.25 MIL/uL — AB (ref 4.40–5.90)
RDW: 12.9 % (ref 11.5–14.5)
WBC: 7.2 10*3/uL (ref 3.8–10.6)

## 2016-09-08 LAB — DIFFERENTIAL
BASOS ABS: 0 10*3/uL (ref 0–0.1)
BASOS PCT: 1 %
Eosinophils Absolute: 0.5 10*3/uL (ref 0–0.7)
Eosinophils Relative: 7 %
LYMPHS PCT: 19 %
Lymphs Abs: 1.4 10*3/uL (ref 1.0–3.6)
Monocytes Absolute: 0.7 10*3/uL (ref 0.2–1.0)
Monocytes Relative: 10 %
NEUTROS ABS: 4.7 10*3/uL (ref 1.4–6.5)
NEUTROS PCT: 65 %

## 2016-09-08 LAB — SEDIMENTATION RATE: Sed Rate: 25 mm/hr — ABNORMAL HIGH (ref 0–20)

## 2016-09-08 LAB — TYPE AND SCREEN
ABO/RH(D): A POS
ANTIBODY SCREEN: NEGATIVE

## 2016-09-08 LAB — PROTIME-INR
INR: 0.98
PROTHROMBIN TIME: 13 s (ref 11.4–15.2)

## 2016-09-08 LAB — SURGICAL PCR SCREEN
MRSA, PCR: NEGATIVE
STAPHYLOCOCCUS AUREUS: NEGATIVE

## 2016-09-08 LAB — APTT: APTT: 33 s (ref 24–36)

## 2016-09-08 NOTE — Pre-Procedure Instructions (Signed)
Anesthesia- Cardiology visit on 07/12/2016   Progress Notes   Daniel Merritts, MD at 07/12/2016 11:20 AM   Status: Signed  Expand All Collapse All   Cardiology Office Note  Date:  07/12/2016   ID:  Daniel Luna, DOB Sep 06, 1941, MRN [id number]  PCP:  Idelle Crouch, MD            Chief Complaint  Patient presents with  . Other    Self ref to establish care for CAD; former cardiologist moved away (Dr. Alanda Amass from [facility]). Meds reviewed by the patient verbally. Denies chest pain or shortness of breath.     HPI:  Daniel Luna is a very pleasant 75 year old gentleman with history of smoking who stopped 27 years ago, coronary artery disease with stent 3 placed in 2013 at [facility] to the proximal to mid LAD, mid RCA and distal RCA, hypertension, hyperlipidemia who has cardiomyopathy, ejection fraction 30-35%, who presents by referral from Dr. Doy Hutching to establish care in our office for his coronary disease  Wife presents with him today, reports that he has chronic shortness of breath on exertion He does go to the gym, does exercise. No significant change in his symptoms Does some aerobic, light weights  In general reports that he is stable Tolerating simvastatin 80 mg daily, LDL in the 90 range Poor diet recently, previous LDL in the 80 range  Takes Lasix with potassium daily  History of total knee replacement on the right, reports that he needs a knee replacement on the left  Long history of smoking but did stopped more than 27 years ago Echocardiogram reviewed through the [place] system documenting ejection fraction 30-35% in 2016  He reports having stress test 1 year ago. Results reviewed October 2016, Low risk study small apical defect, ejection fraction 36%  He reports having shortness of breath prior to previous stents in 2013 Records reviewed from previous cardiologist Stent to 70% mid RCA lesion 3.5 x 15 mm xience, Stent to 80% distal RCA 3.5 x 15 mm xience Stent to mid  LAD 80% lesion 3.0 x 23 mm xience  EKG on today's visit shows normal sinus rhythm with rate 80 bpm, right bundle branch block, left anterior fascicular block, rare PVCs   PMH:   has a past medical history of Arthritis; CHF (congestive heart failure) ([place]); Coronary artery disease; Cyst of brain (1983); Diabetes mellitus without complication ([place]); Dysrhythmia; Heart failure ([place]); Hyperlipidemia; Hypertension; Ischemic cardiomyopathy; Macular degeneration; and Shortness of breath dyspnea.  PSH:         Past Surgical History:  Procedure Laterality Date  . APPENDECTOMY  1997  . CARDIAC CATHETERIZATION    . COLONOSCOPY  2005   4 polyps  . crainectomy    . crainoplasty    . CYST EXCISION  08-06-13   back  . cyst removal brain  1983   benign dermatoid cyst  . EYE SURGERY Bilateral 2008, 2012  . stents  2013   3 placed  . TOTAL KNEE ARTHROPLASTY Right 05/21/2015   Procedure: TOTAL KNEE ARTHROPLASTY;  Surgeon: Dereck Leep, MD;  Location: [facility] ORS;  Service: Orthopedics;  Laterality: Right;          Current Outpatient Prescriptions  Medication Sig Dispense Refill  . ACCU-CHEK FASTCLIX LANCETS MISC     . ACCU-CHEK SMARTVIEW test strip     . aspirin EC 81 MG tablet Take 81 mg by mouth daily.     . carvedilol (COREG) 12.5 MG tablet Take 12.5  mg by mouth 2 (two) times daily with a meal.    . furosemide (LASIX) 20 MG tablet Take 20 mg by mouth.    Marland Kitchen lisinopril (PRINIVIL,ZESTRIL) 40 MG tablet Take 40 mg by mouth daily.    . meloxicam (MOBIC) 7.5 MG tablet Take 7.5 mg by mouth daily.     . metFORMIN (GLUCOPHAGE) 1000 MG tablet Take 1,000 mg by mouth 2 (two) times daily with a meal.    . Multiple Vitamins-Minerals (VISION VITAMINS PO) Take 1 tablet by mouth 2 (two) times daily.    . potassium chloride SA (K-DUR,KLOR-CON) 20 MEQ tablet Take 0.5 tablets by mouth daily.     . simvastatin (ZOCOR) 80 MG tablet Take 80 mg by mouth at bedtime.     No  current facility-administered medications for this visit.      Allergies:   Iodinated diagnostic agents   Social History:  The patient  reports that he quit smoking about 27 years ago. His smoking use included Cigarettes. He has a 54.00 pack-year smoking history. He has never used smokeless tobacco. He reports that he drinks alcohol. He reports that he does not use drugs.   Family History:   family history includes Throat cancer in his mother.    Review of Systems: Review of Systems  Constitutional: Negative.   Respiratory: Positive for shortness of breath.   Cardiovascular: Negative.   Gastrointestinal: Negative.   Musculoskeletal: Negative.   Neurological: Negative.   Psychiatric/Behavioral: Negative.   All other systems reviewed and are negative.    PHYSICAL EXAM: VS:  BP 140/88 (BP Location: Left Arm, Patient Position: Sitting, Cuff Size: Normal)   Pulse 79   Ht 6\' 3"  (1.905 m)   Wt 240 lb (108.9 kg)   BMI 30.00 kg/m  , BMI Body mass index is 30 kg/m. GEN: Well nourished, well developed, in no acute distress  HEENT: normal  Neck: no JVD, carotid bruits, or masses Cardiac: RRR; no murmurs, rubs, or gallops,no edema  Respiratory:  clear to auscultation bilaterally, normal work of breathing GI: soft, nontender, nondistended, + BS MS: no deformity or atrophy  Skin: warm and dry, no rash Neuro:  Strength and sensation are intact Psych: euthymic mood, full affect    Recent Labs: LDL 96  Lipid Panel RecentLabs  No results found for: CHOL, HDL, LDLCALC, TRIG    LDL is above     Wt Readings from Last 3 Encounters:  07/12/16 240 lb (108.9 kg)  05/21/15 227 lb (103 kg)  05/07/15 233 lb (105.7 kg)       ASSESSMENT AND PLAN:  Atherosclerosis of native coronary artery without angina pectoris, unspecified whether native or transplanted heart - Plan: EKG 12-Lead Long discussion concerning his shortness of breath. Suspect this is secondary to  obesity and deconditioning. No significant change in his symptoms. Recommended if symptoms get worse that he call our office for stress test He did have stress test 1 year ago Long discussion concerning lipid management  Congestive heart failure, unspecified congestive heart failure chronicity, unspecified congestive heart failure type ([place]) - Plan: EKG 12-Lead Long discussion concerning symptoms of CHF. Ejection fraction is low, we did recommend he consider starting entresto and stopping lisinopril. He will research the medication and call us back  History of coronary artery stent placement 3 drug-eluting stents placed in 2013, currently on aspirin Took himself off effient  Hyperlipidemia Long discussion concerning LDL goal less than 70 His is currently in the 90 range  Cardiomyopathy,  ischemic Previous ejection fraction 30-35% in 2016 on echocardiogram Results discussed with him in detail We did discuss changing him to entresto and holding the lisinopril. He would like to research the medication  Shortness of breath Likely secondary to obesity, deconditioning, previous smoking history If symptoms get worse, recommended he call our office for stress test   Total encounter time more than 45 minutes  Greater than 50% was spent in counseling and coordination of care with the patient    Disposition:   F/U  6 months      Orders Placed This Encounter  Procedures  . EKG 12-Lead     Signed, Esmond Plants, M.D., Ph.D. 07/12/2016  [place], [place]      Referring Provider   Idelle Crouch, MD      Diagnoses    Codes Comments  Atherosclerosis of native coronary artery without angina pectoris, unspecified whether native or transplanted heart - Primary ICD-9-CM: 414.01 ICD-10-CM: I25.10   Congestive heart failure, unspecified congestive heart failure chronicity, unspecified congestive heart failure type ([place])   ICD-9-CM: 428.0 ICD-10-CM: I50.9   History of coronary artery stent placement  ICD-9-CM: V45.82 ICD-10-CM: Z95.5   Hyperlipidemia  ICD-9-CM: 272.4 ICD-10-CM: E78.5   Cardiomyopathy, ischemic  ICD-9-CM: 414.8 ICD-10-CM: I25.5        Reason for Visit   Other Self ref to establish care for CAD; former cardiologist moved away (Dr. Alanda Amass from [facility]). Meds reviewed by the patient verbally. Denies chest pain or shortness of breath.   Reason for Visit History    Ordered Medications    Disp Refills Start End  carvedilol (COREG) 12.5 MG tablet (Discontinued) 180 tablet 3 07/12/2016 07/29/2016  Take 1 tablet (12.5 mg total) by mouth 2 (two) times daily with a meal. - Oral  Reason for Discontinue: Reorder  furosemide (LASIX) 20 MG tablet 90 tablet 6 07/12/2016   Take 1 tablet (20 mg total) by mouth daily. - Oral  lisinopril (PRINIVIL,ZESTRIL) 40 MG tablet 90 tablet 3 07/12/2016   Take 1 tablet (40 mg total) by mouth daily. - Oral  potassium chloride SA (K-DUR,KLOR-CON) 20 MEQ tablet 90 tablet 3 07/12/2016   Take 0.5 tablets (10 mEq total) by mouth daily. - Oral  simvastatin (ZOCOR) 80 MG tablet 90 tablet 3 07/12/2016   Take 1 tablet (80 mg total) by mouth at bedtime. - Oral  Discontinued Medications    Reason for Discontinue  oxyCODONE (OXY IR/ROXICODONE) 5 MG immediate release tablet Completed Course  enoxaparin (LOVENOX) 40 MG/0.4ML injection Completed Course  polyethylene glycol powder (GLYCOLAX/MIRALAX) powder Completed Course  Influenza Vac A&B Surf Ant Adj (FLUAD) 0.5 ML SUSY Completed Course  traMADol (ULTRAM) 50 MG tablet Completed Course  prasugrel (EFFIENT) 10 MG TABS tablet Completed Course  carvedilol (COREG) 12.5 MG tablet Reorder  furosemide (LASIX) 20 MG tablet Reorder  lisinopril (PRINIVIL,ZESTRIL) 40 MG tablet Reorder  potassium chloride SA (K-DUR,KLOR-CON) 20 MEQ tablet Reorder  simvastatin (ZOCOR) 80 MG tablet Reorder  Medication Notes   aspirin EC 81 MG tablet   Anselm Pancoast, CMA 07/12/2016 11:26 AM Received from: [place]: Take 81 mg by mouth once daily.    meloxicam (MOBIC) 7.5 MG tablet   Anselm Pancoast, CMA 07/12/2016 11:26 AM Received from: [place]: Take 1 tablet (7.5 mg total) by mouth 2 (two) times daily after meals.    Influenza Vac A&B Surf Ant Adj (FLUAD) 0.5 ML SUSY  Anselm Pancoast, CMA 07/12/2016 11:29 AM Received from: [place]    Level of Service   Level of Service  PR OFFICE OUTPATIENT NEW 45 MINUTES [phone]  LOS History   Follow-up and Disposition   Return in about 6 months (around 01/12/2017).  Follow-up and Disposition History    All Charges for This Encounter   Code Description Service Date Service Provider Modifiers Otho Darner  [phone] PR OFFICE OUTPATIENT NEW 45 MINUTES 07/12/2016 Daniel Merritts, MD  1  93000 PR ELECTROCARDIOGRAM, COMPLETE 07/12/2016 Daniel Merritts, MD  1  AVS Reports   Date/Time Report Action User  07/12/2016 12:30 PM After Visit Summary Printed Daniel Merritts, MD  07/12/2016 12:27 PM After Visit Summary Printed Daniel Merritts, MD  Patient Instructions   Medication Instructions:   Please research zetia 10 mg daily for cholesterol, We would combine this pill with the simvastatin  Research entresto twice a day This would take the place of the lisinopril    Follow-Up: It was a pleasure seeing you in the office today. Please call us if you have new issues that need to be addressed before your next appt.  [phone]  Your physician wants you to follow-up in: 6 months.  You will receive a reminder letter in the mail two months in advance. If you don't receive a letter, please call our office to schedule the follow-up appointment.  If you need a refill on your cardiac medications before your next appointment, please call your pharmacy.         Patient Instructions History  Routing History    Recipient Method Sent by Date Sent  Idelle Crouch, MD Fax Daniel Merritts, MD 07/12/2016  Fax: [phone] Phone: [phone]   Previous Visit   Date & Time 05/21/2015 11:50 AM Provider Dereck Leep, MD Department [facility] Encounter # [id number]  Medical Necessity Transport Form   Medical Necessity  [place], Koz[place]owski [id number]) (75 y.o. M)

## 2016-09-08 NOTE — Patient Instructions (Signed)
Your procedure is scheduled on: September 21, 2016 Su procedimiento est programado para: Report to SECOND FLOOR [facility]ICAL [facility] COME IN THRU ENTRANCE WITH REVOLVING DOOR. Presntese a: To find out your arrival time please call [phone] between 1PM - 3PM on September 20, 2016 Para saber su hora de llegada por favor llame al [phone] entre la 1PM - 3PM el da:  Remember: Instructions that are not followed completely may result in serious medical risk, up to and including death, or upon the discretion of your surgeon and anesthesiologist your surgery may need to be rescheduled.  Recuerde: Las instrucciones que no se siguen completamente Armed forces logistics/support/administrative officerpueden resultar en un riesgo de salud grave, incluyendo hasta la [place] o a discrecin de su cirujano y Scientific laboratory techniciananestesilogo, su ciruga se puede posponer.   __X__ 1. Do not eat food or drink liquids after midnight. No gum chewing or hard candies.  No coma alimentos ni tome lquidos despus de la medianoche.  No mastique chicle ni caramelos  duros.     __X__ 2. No alcohol for 24 hours before or after surgery.    No tome alcohol durante las 24 horas antes ni despus de la [place].   _X___ 3. Bring all medications with you on the day of surgery if instructed. BRING ANY NEW MEDICATIONS TO HOSPITAL    Lleve todos los medicamentos con usted el da de su ciruga si se le ha indicado as.   __X__ 4. Notify your doctor if there is any change in your medical condition (cold, fever,                             infections).    Informe a su mdico si [place] algn cambio en su condicin mdica (resfriado, fiebre, infecciones).   Do not wear jewelry, make-up, hairpins, clips or nail polish.  No use joyas, maquillajes, pinzas/ganchos para el cabello ni esmalte de uas.  Do not wear lotions, powders, or perfumes. You may wear deodorant.  No use lociones, polvos o perfumes.  Puede usar desodorante.    Do not shave 48 hours prior to surgery. Men may shave face and neck.  No se  afeite 48 horas antes de la [place].  Los hombres pueden Commercial Metals Companyafeitarse la cara y el cuello.   Do not bring valuables to the hospital.   No lleve objetos de valor al hospital.  [facility] is not responsible for any belongings or valuables.  [place] no se hace responsable de ningn tipo de pertenencias u objetos de Licensed conveyancervalor.               Contacts, dentures or bridgework may not be worn into surgery.  Los lentes de [place] Parkcontacto, las dentaduras postizas o puentes no se pueden usar en la [place].  Leave your suitcase in the car. After surgery it may be brought to your room.  Deje su maleta en el auto.  Despus de la ciruga podr traerla a su habitacin.  For patients admitted to the hospital, discharge time is determined by your treatment team.  Para los pacientes que sean ingresados al hospital, el tiempo en el cual se le dar de alta es determinado por su                equipo de Dru[place] Hillstratamiento.   Patients discharged the day of surgery will not be allowed to drive home. A los pacientes que se les da de alta el mismo da de la Leisure centre managerciruga  no se les permitir conducir a casa.   Please read over the following fact sheets that you were given: Por favor [place] informacin que le dieron:   CHG INSTRUCTIONS AND MRSA INFORMATION   _X___ Take these medicines the morning of surgery with A SIP OF WATER:          [employer] estas medicinas la maana de la ciruga con UN SORBO DE AGUA:  1. CARVEDILOL  2. LISINOPRIL   3.   4.       5.  6.  ____ Fleet Enema (as directed)          Enema de Fleet (segn lo indicado)    __X__ Use CHG Soap as directed          Utilice el jabn de CHG segn lo indicado  ____ Use inhalers on the day of surgery          Use los inhaladores el da de la ciruga  __X__ Stop metformin 2 days prior to surgery           Deje de tomar el metformin 2 das antes de la ciruga    ____ Take 1/2 of usual insulin dose the night before surgery and none on the morning of  surgery           Tome la mitad de la dosis habitual de insulina la noche antes de la [place] y no tome nada en la maana de la             ciruga  ___X_ Stop Coumadin/Plavix/aspirin on 09-14-2016          Deje de tomar el Coumadin/Plavix/aspirina el da:  __X__ Stop Anti-inflammatories on 09-14-2016   ADVIL ALEVE IBUPROFEN, NAPROXEN - TAKE TYLENOL ONLY           Deje de tomar antiinflamatorios el da:   __X__ Stop supplements until after surgery  09-14-2016          Deje de tomar suplementos hasta despus de la ciruga  ____ Bring C-Pap to the hospital          [place] al hospital

## 2016-09-09 LAB — URINE CULTURE: Culture: NO GROWTH

## 2016-09-21 ENCOUNTER — Encounter: Payer: Self-pay | Admitting: Anesthesiology

## 2016-09-21 ENCOUNTER — Inpatient Hospital Stay: Payer: Medicare Other | Admitting: Anesthesiology

## 2016-09-21 ENCOUNTER — Encounter
Admission: RE | Disposition: A | Discharge status: Home Health | Payer: Self-pay | Source: Ambulatory Visit | Attending: Orthopedic Surgery

## 2016-09-21 ENCOUNTER — Inpatient Hospital Stay
Admission: RE | Admit: 2016-09-21 | Discharge: 2016-09-23 | DRG: 470 | Disposition: A | Discharge status: Home Health | Payer: Medicare Other | Source: Ambulatory Visit | Attending: Orthopedic Surgery | Admitting: Orthopedic Surgery

## 2016-09-21 ENCOUNTER — Inpatient Hospital Stay: Payer: Medicare Other

## 2016-09-21 DIAGNOSIS — Z9861 Coronary angioplasty status: Secondary | ICD-10-CM

## 2016-09-21 DIAGNOSIS — E785 Hyperlipidemia, unspecified: Secondary | ICD-10-CM | POA: Diagnosis present

## 2016-09-21 DIAGNOSIS — M6281 Muscle weakness (generalized): Secondary | ICD-10-CM

## 2016-09-21 DIAGNOSIS — M1712 Unilateral primary osteoarthritis, left knee: Secondary | ICD-10-CM | POA: Diagnosis present

## 2016-09-21 DIAGNOSIS — Z96651 Presence of right artificial knee joint: Secondary | ICD-10-CM | POA: Diagnosis present

## 2016-09-21 DIAGNOSIS — I255 Ischemic cardiomyopathy: Secondary | ICD-10-CM | POA: Diagnosis present

## 2016-09-21 DIAGNOSIS — Z7984 Long term (current) use of oral hypoglycemic drugs: Secondary | ICD-10-CM

## 2016-09-21 DIAGNOSIS — Z955 Presence of coronary angioplasty implant and graft: Secondary | ICD-10-CM

## 2016-09-21 DIAGNOSIS — I2510 Atherosclerotic heart disease of native coronary artery without angina pectoris: Secondary | ICD-10-CM | POA: Diagnosis present

## 2016-09-21 DIAGNOSIS — E119 Type 2 diabetes mellitus without complications: Secondary | ICD-10-CM | POA: Diagnosis present

## 2016-09-21 DIAGNOSIS — Z7982 Long term (current) use of aspirin: Secondary | ICD-10-CM

## 2016-09-21 DIAGNOSIS — H3530 Unspecified macular degeneration: Secondary | ICD-10-CM | POA: Diagnosis present

## 2016-09-21 DIAGNOSIS — G8918 Other acute postprocedural pain: Secondary | ICD-10-CM

## 2016-09-21 DIAGNOSIS — I509 Heart failure, unspecified: Secondary | ICD-10-CM | POA: Diagnosis present

## 2016-09-21 DIAGNOSIS — I110 Hypertensive heart disease with heart failure: Secondary | ICD-10-CM | POA: Diagnosis present

## 2016-09-21 DIAGNOSIS — Z79899 Other long term (current) drug therapy: Secondary | ICD-10-CM

## 2016-09-21 DIAGNOSIS — Z87891 Personal history of nicotine dependence: Secondary | ICD-10-CM

## 2016-09-21 DIAGNOSIS — R262 Difficulty in walking, not elsewhere classified: Secondary | ICD-10-CM

## 2016-09-21 HISTORY — PX: TOTAL KNEE ARTHROPLASTY: SHX125

## 2016-09-21 LAB — GLUCOSE, CAPILLARY
GLUCOSE-CAPILLARY: 144 mg/dL — AB (ref 65–99)
Glucose-Capillary: 127 mg/dL — ABNORMAL HIGH (ref 65–99)

## 2016-09-21 LAB — CREATININE, SERUM
Creatinine, Ser: 1.13 mg/dL (ref 0.61–1.24)
GFR calc non Af Amer: >60 mL/min (ref >60)

## 2016-09-21 LAB — CBC
HEMATOCRIT: 38.7 % — AB (ref 40.0–52.0)
Hemoglobin: 13.4 g/dL (ref 13.0–18.0)
MCH: 32.4 pg (ref 26.0–34.0)
MCHC: 34.5 g/dL (ref 32.0–36.0)
MCV: 93.7 fL (ref 80.0–100.0)
PLATELETS: 130 10*3/uL — AB (ref 150–440)
RBC: 4.12 MIL/uL — AB (ref 4.40–5.90)
RDW: 13 % (ref 11.5–14.5)
WBC: 6.8 10*3/uL (ref 3.8–10.6)

## 2016-09-21 SURGERY — ARTHROPLASTY, KNEE, TOTAL
Anesthesia: Spinal | Site: Knee | Laterality: Left | Wound class: Clean

## 2016-09-21 MED ORDER — MENTHOL 3 MG MT LOZG
1.0000 | LOZENGE | OROMUCOSAL | Status: DC | PRN
  Filled 2016-09-21: qty 9

## 2016-09-21 MED ORDER — POTASSIUM CHLORIDE CRYS ER 10 MEQ PO TBCR
10.0000 meq | EXTENDED_RELEASE_TABLET | Freq: Every day | ORAL | Status: DC
  Administered 2016-09-21 – 2016-09-23 (×3): 10 meq via ORAL
  Filled 2016-09-21 (×3): qty 1

## 2016-09-21 MED ORDER — SODIUM CHLORIDE 0.9 % IV SOLN
INTRAVENOUS | Status: DC
  Administered 2016-09-21 (×2): via INTRAVENOUS

## 2016-09-21 MED ORDER — PROPOFOL 500 MG/50ML IV EMUL
INTRAVENOUS | Status: DC | PRN
  Administered 2016-09-21: 35 ug/kg/min via INTRAVENOUS

## 2016-09-21 MED ORDER — CARVEDILOL 12.5 MG PO TABS
12.5000 mg | ORAL_TABLET | Freq: Two times a day (BID) | ORAL | Status: DC
  Administered 2016-09-21 – 2016-09-23 (×4): 12.5 mg via ORAL
  Filled 2016-09-21 (×4): qty 1

## 2016-09-21 MED ORDER — SODIUM CHLORIDE 0.9 % IV SOLN
INTRAVENOUS | Status: DC
  Administered 2016-09-21: 15:00:00 via INTRAVENOUS

## 2016-09-21 MED ORDER — BUPIVACAINE HCL (PF) 0.5 % IJ SOLN
INTRAMUSCULAR | Status: DC | PRN
  Administered 2016-09-21: 3 mL

## 2016-09-21 MED ORDER — DIPHENHYDRAMINE HCL 12.5 MG/5ML PO ELIX: 12.5000 mg | ORAL_SOLUTION | ORAL | Status: DC | PRN

## 2016-09-21 MED ORDER — SODIUM CHLORIDE 0.9 % IJ SOLN
INTRAMUSCULAR | Status: AC
  Filled 2016-09-21: qty 50

## 2016-09-21 MED ORDER — OCUVITE-LUTEIN PO CAPS
1.0000 | ORAL_CAPSULE | Freq: Two times a day (BID) | ORAL | Status: DC
  Administered 2016-09-21 – 2016-09-23 (×3): 1 via ORAL
  Filled 2016-09-21 (×4): qty 1

## 2016-09-21 MED ORDER — NEOMYCIN-POLYMYXIN B GU 40-200000 IR SOLN
Status: AC
  Filled 2016-09-21: qty 20

## 2016-09-21 MED ORDER — NEOMYCIN-POLYMYXIN B GU 40-200000 IR SOLN
Status: DC | PRN
  Administered 2016-09-21: 16 mL

## 2016-09-21 MED ORDER — FENTANYL CITRATE (PF) 100 MCG/2ML IJ SOLN
INTRAMUSCULAR | Status: DC | PRN
  Administered 2016-09-21 (×3): 25 ug via INTRAVENOUS

## 2016-09-21 MED ORDER — BUPIVACAINE-EPINEPHRINE (PF) 0.25% -1:200000 IJ SOLN
INTRAMUSCULAR | Status: AC
  Filled 2016-09-21: qty 30

## 2016-09-21 MED ORDER — ASPIRIN EC 81 MG PO TBEC
81.0000 mg | DELAYED_RELEASE_TABLET | Freq: Every day | ORAL | Status: DC
  Administered 2016-09-21 – 2016-09-23 (×3): 81 mg via ORAL
  Filled 2016-09-21 (×3): qty 1

## 2016-09-21 MED ORDER — SODIUM CHLORIDE 0.9 % IV SOLN
INTRAVENOUS | Status: DC | PRN
  Administered 2016-09-21: 1000 mg via INTRAVENOUS

## 2016-09-21 MED ORDER — ENOXAPARIN SODIUM 30 MG/0.3ML ~~LOC~~ SOLN
30.0000 mg | Freq: Two times a day (BID) | SUBCUTANEOUS | Status: DC
  Administered 2016-09-22 – 2016-09-23 (×3): 30 mg via SUBCUTANEOUS
  Filled 2016-09-21 (×3): qty 0.3

## 2016-09-21 MED ORDER — BUPIVACAINE-EPINEPHRINE (PF) 0.25% -1:200000 IJ SOLN
INTRAMUSCULAR | Status: DC | PRN
  Administered 2016-09-21: 30 mL via PERINEURAL

## 2016-09-21 MED ORDER — INSULIN ASPART 100 UNIT/ML ~~LOC~~ SOLN
0.0000 [IU] | Freq: Three times a day (TID) | SUBCUTANEOUS | Status: DC
  Administered 2016-09-22 – 2016-09-23 (×4): 3 [IU] via SUBCUTANEOUS
  Filled 2016-09-21: qty 3
  Filled 2016-09-21: qty 5
  Filled 2016-09-21 (×2): qty 3

## 2016-09-21 MED ORDER — FENTANYL CITRATE (PF) 100 MCG/2ML IJ SOLN: 25.0000 ug | INTRAMUSCULAR | Status: DC | PRN

## 2016-09-21 MED ORDER — FAMOTIDINE 20 MG PO TABS
ORAL_TABLET | ORAL | Status: AC
  Administered 2016-09-21: 20 mg via ORAL
  Filled 2016-09-21: qty 1

## 2016-09-21 MED ORDER — PHENOL 1.4 % MT LIQD
1.0000 | OROMUCOSAL | Status: DC | PRN
  Filled 2016-09-21: qty 177

## 2016-09-21 MED ORDER — SODIUM CHLORIDE 0.9 % IV SOLN
INTRAVENOUS | Status: DC | PRN
  Administered 2016-09-21: 20 ug/min via INTRAVENOUS

## 2016-09-21 MED ORDER — BUPIVACAINE LIPOSOME 1.3 % IJ SUSP
INTRAMUSCULAR | Status: AC
  Filled 2016-09-21: qty 20

## 2016-09-21 MED ORDER — BISACODYL 10 MG RE SUPP: 10.0000 mg | Freq: Every day | RECTAL | Status: DC | PRN

## 2016-09-21 MED ORDER — LISINOPRIL 20 MG PO TABS
40.0000 mg | ORAL_TABLET | Freq: Every day | ORAL | Status: DC
  Administered 2016-09-22 – 2016-09-23 (×2): 40 mg via ORAL
  Filled 2016-09-21 (×2): qty 2

## 2016-09-21 MED ORDER — SODIUM CHLORIDE 0.9 % IV SOLN
INTRAVENOUS | Status: DC | PRN
  Administered 2016-09-21: 60 mL

## 2016-09-21 MED ORDER — MIDAZOLAM HCL 5 MG/5ML IJ SOLN
INTRAMUSCULAR | Status: DC | PRN
  Administered 2016-09-21: 2 mg via INTRAVENOUS

## 2016-09-21 MED ORDER — SODIUM CHLORIDE 0.9 % IV SOLN
INTRAVENOUS | Status: DC
  Administered 2016-09-21: 16:00:00 via INTRAVENOUS

## 2016-09-21 MED ORDER — MORPHINE SULFATE (PF) 10 MG/ML IV SOLN
INTRAVENOUS | Status: AC
  Filled 2016-09-21: qty 1

## 2016-09-21 MED ORDER — METHOCARBAMOL 500 MG PO TABS
500.0000 mg | ORAL_TABLET | Freq: Four times a day (QID) | ORAL | Status: DC | PRN
  Administered 2016-09-21 (×2): 500 mg via ORAL
  Filled 2016-09-21 (×2): qty 1

## 2016-09-21 MED ORDER — METOCLOPRAMIDE HCL 10 MG PO TABS: 5.0000 mg | ORAL_TABLET | Freq: Three times a day (TID) | ORAL | Status: DC | PRN

## 2016-09-21 MED ORDER — METFORMIN HCL 500 MG PO TABS
1000.0000 mg | ORAL_TABLET | Freq: Two times a day (BID) | ORAL | Status: DC
  Administered 2016-09-21 – 2016-09-23 (×4): 1000 mg via ORAL
  Filled 2016-09-21 (×4): qty 2

## 2016-09-21 MED ORDER — PHENYLEPHRINE HCL 10 MG/ML IJ SOLN
INTRAMUSCULAR | Status: DC | PRN
  Administered 2016-09-21 (×5): 100 ug via INTRAVENOUS

## 2016-09-21 MED ORDER — METHOCARBAMOL 1000 MG/10ML IJ SOLN
500.0000 mg | Freq: Four times a day (QID) | INTRAVENOUS | Status: DC | PRN
  Filled 2016-09-21: qty 5

## 2016-09-21 MED ORDER — CEFAZOLIN SODIUM-DEXTROSE 2-4 GM/100ML-% IV SOLN
INTRAVENOUS | Status: AC
  Filled 2016-09-21: qty 100

## 2016-09-21 MED ORDER — MAGNESIUM HYDROXIDE 400 MG/5ML PO SUSP
30.0000 mL | Freq: Every day | ORAL | Status: DC | PRN
  Administered 2016-09-22: 30 mL via ORAL
  Filled 2016-09-21: qty 30

## 2016-09-21 MED ORDER — ONDANSETRON HCL 4 MG PO TABS: 4.0000 mg | ORAL_TABLET | Freq: Four times a day (QID) | ORAL | Status: DC | PRN

## 2016-09-21 MED ORDER — DOCUSATE SODIUM 100 MG PO CAPS
100.0000 mg | ORAL_CAPSULE | Freq: Two times a day (BID) | ORAL | Status: DC
  Administered 2016-09-21 – 2016-09-22 (×3): 100 mg via ORAL
  Filled 2016-09-21 (×4): qty 1

## 2016-09-21 MED ORDER — FAMOTIDINE 20 MG PO TABS
20.0000 mg | ORAL_TABLET | Freq: Once | ORAL | Status: AC
  Administered 2016-09-21: 20 mg via ORAL

## 2016-09-21 MED ORDER — METOCLOPRAMIDE HCL 5 MG/ML IJ SOLN: 5.0000 mg | Freq: Three times a day (TID) | INTRAMUSCULAR | Status: DC | PRN

## 2016-09-21 MED ORDER — TRANEXAMIC ACID 1000 MG/10ML IV SOLN
INTRAVENOUS | Status: AC
  Filled 2016-09-21: qty 10

## 2016-09-21 MED ORDER — ONDANSETRON HCL 4 MG/2ML IJ SOLN: 4.0000 mg | Freq: Once | INTRAMUSCULAR | Status: DC | PRN

## 2016-09-21 MED ORDER — OXYCODONE HCL 5 MG PO TABS
5.0000 mg | ORAL_TABLET | ORAL | Status: DC | PRN
  Administered 2016-09-21 – 2016-09-22 (×7): 10 mg via ORAL
  Filled 2016-09-21 (×7): qty 2

## 2016-09-21 MED ORDER — MORPHINE SULFATE 10 MG/ML IJ SOLN
INTRAMUSCULAR | Status: DC | PRN
  Administered 2016-09-21: 10 mg

## 2016-09-21 MED ORDER — FUROSEMIDE 20 MG PO TABS
20.0000 mg | ORAL_TABLET | Freq: Every day | ORAL | Status: DC
  Administered 2016-09-21 – 2016-09-23 (×3): 20 mg via ORAL
  Filled 2016-09-21 (×3): qty 1

## 2016-09-21 MED ORDER — CEFAZOLIN SODIUM-DEXTROSE 2-4 GM/100ML-% IV SOLN
2.0000 g | Freq: Four times a day (QID) | INTRAVENOUS | Status: AC
  Administered 2016-09-21 (×2): 2 g via INTRAVENOUS
  Filled 2016-09-21 (×2): qty 100

## 2016-09-21 MED ORDER — MORPHINE SULFATE (PF) 2 MG/ML IV SOLN
2.0000 mg | INTRAVENOUS | Status: DC | PRN
  Administered 2016-09-21 (×3): 2 mg via INTRAVENOUS
  Filled 2016-09-21 (×3): qty 1

## 2016-09-21 MED ORDER — ACETAMINOPHEN 650 MG RE SUPP: 650.0000 mg | Freq: Four times a day (QID) | RECTAL | Status: DC | PRN

## 2016-09-21 MED ORDER — CEFAZOLIN SODIUM-DEXTROSE 2-4 GM/100ML-% IV SOLN
2.0000 g | Freq: Once | INTRAVENOUS | Status: AC
  Administered 2016-09-21: 2 g via INTRAVENOUS

## 2016-09-21 MED ORDER — PHENYLEPHRINE HCL 10 MG/ML IJ SOLN
0.0000 ug/min | INTRAVENOUS | Status: DC
  Filled 2016-09-21: qty 1

## 2016-09-21 MED ORDER — ONDANSETRON HCL 4 MG/2ML IJ SOLN: 4.0000 mg | Freq: Four times a day (QID) | INTRAMUSCULAR | Status: DC | PRN

## 2016-09-21 MED ORDER — ATORVASTATIN CALCIUM 20 MG PO TABS
40.0000 mg | ORAL_TABLET | Freq: Every day | ORAL | Status: DC
  Administered 2016-09-21 – 2016-09-22 (×2): 40 mg via ORAL
  Filled 2016-09-21 (×2): qty 2

## 2016-09-21 MED ORDER — ACETAMINOPHEN 325 MG PO TABS: 650.0000 mg | ORAL_TABLET | Freq: Four times a day (QID) | ORAL | Status: DC | PRN

## 2016-09-21 MED ORDER — ZOLPIDEM TARTRATE 5 MG PO TABS: 5.0000 mg | ORAL_TABLET | Freq: Every evening | ORAL | Status: DC | PRN

## 2016-09-21 MED ORDER — MAGNESIUM CITRATE PO SOLN
1.0000 | Freq: Once | ORAL | Status: DC | PRN
  Filled 2016-09-21: qty 296

## 2016-09-21 SURGICAL SUPPLY — 64 items
BANDAGE ACE 6X5 VEL STRL LF (GAUZE/BANDAGES/DRESSINGS) ×2 IMPLANT
BLADE SAW 1 (BLADE) ×2 IMPLANT
BLOCK CUTTING TIBIAL 5 LT (MISCELLANEOUS) IMPLANT
BONE CEMENT GENTAMICIN (Cement) ×4 IMPLANT
CANISTER SUCT 1200ML W/VALVE (MISCELLANEOUS) ×2 IMPLANT
CANISTER SUCT 3000ML (MISCELLANEOUS) ×4 IMPLANT
CAPT KNEE TOTAL 3 ×2 IMPLANT
CATH FOL LEG HOLDER (MISCELLANEOUS) ×2 IMPLANT
CATH TRAY METER 16FR LF (MISCELLANEOUS) ×2 IMPLANT
CEMENT BONE GENTAMICIN 40 (Cement) ×2 IMPLANT
CHLORAPREP W/TINT 26ML (MISCELLANEOUS) ×4 IMPLANT
COOLER POLAR GLACIER W/PUMP (MISCELLANEOUS) ×2 IMPLANT
CUFF TOURN 24 STER (MISCELLANEOUS) IMPLANT
CUFF TOURN 30 STER DUAL PORT (MISCELLANEOUS) IMPLANT
DRAPE INCISE IOBAN 66X45 STRL (DRAPES) ×4 IMPLANT
DRAPE SHEET LG 3/4 BI-LAMINATE (DRAPES) ×4 IMPLANT
ELECT CAUTERY BLADE 6.4 (BLADE) ×2 IMPLANT
ELECT REM PT RETURN 9FT ADLT (ELECTROSURGICAL) ×2
ELECTRODE REM PT RTRN 9FT ADLT (ELECTROSURGICAL) ×1 IMPLANT
FEMUR BONE MODEL-49010 IMPLANT
FEMUR CUTTING BLOCK- 41015M IMPLANT
GAUZE PETRO XEROFOAM 1X8 (MISCELLANEOUS) ×2 IMPLANT
GAUZE SPONGE 4X4 12PLY STRL (GAUZE/BANDAGES/DRESSINGS) ×2 IMPLANT
GLOVE BIOGEL PI IND STRL 9 (GLOVE) ×1 IMPLANT
GLOVE BIOGEL PI INDICATOR 9 (GLOVE) ×1
GLOVE INDICATOR 8.0 STRL GRN (GLOVE) ×6 IMPLANT
GLOVE SURG ORTHO 8.0 STRL STRW (GLOVE) ×4 IMPLANT
GLOVE SURG SYN 9.0  PF PI (GLOVE) ×1
GLOVE SURG SYN 9.0 PF PI (GLOVE) ×1 IMPLANT
GOWN SRG 2XL LVL 4 RGLN SLV (GOWNS) ×1 IMPLANT
GOWN STRL NON-REIN 2XL LVL4 (GOWNS) ×1
GOWN STRL REUS W/ TWL LRG LVL3 (GOWN DISPOSABLE) ×2 IMPLANT
GOWN STRL REUS W/ TWL XL LVL3 (GOWN DISPOSABLE) ×1 IMPLANT
GOWN STRL REUS W/TWL LRG LVL3 (GOWN DISPOSABLE) ×2
GOWN STRL REUS W/TWL XL LVL3 (GOWN DISPOSABLE) ×1
HANDPIECE INTERPULSE COAX TIP (DISPOSABLE) ×1
HOOD PEEL AWAY FLYTE STAYCOOL (MISCELLANEOUS) ×4 IMPLANT
IMMBOLIZER KNEE 19 BLUE UNIV (SOFTGOODS) ×2 IMPLANT
KIT RM TURNOVER STRD PROC AR (KITS) ×2 IMPLANT
KNEE MEDACTA TIBIAL/FEMORAL BL (Knees) ×2 IMPLANT
KNIFE SCULPS 14X20 (INSTRUMENTS) ×2 IMPLANT
NDL SAFETY 18GX1.5 (NEEDLE) ×2 IMPLANT
NEEDLE HYPO 22GX1.5 SAFETY (NEEDLE) ×2 IMPLANT
NEEDLE SPNL 18GX3.5 QUINCKE PK (NEEDLE) ×2 IMPLANT
NEEDLE SPNL 20GX3.5 QUINCKE YW (NEEDLE) ×2 IMPLANT
NS IRRIG 1000ML POUR BTL (IV SOLUTION) ×2 IMPLANT
PACK TOTAL KNEE (MISCELLANEOUS) ×2 IMPLANT
PAD WRAPON POLAR KNEE (MISCELLANEOUS) ×1 IMPLANT
SET HNDPC FAN SPRY TIP SCT (DISPOSABLE) ×1 IMPLANT
SOL .9 NS 3000ML IRR  AL (IV SOLUTION) ×1
SOL .9 NS 3000ML IRR UROMATIC (IV SOLUTION) ×1 IMPLANT
STAPLER SKIN PROX 35W (STAPLE) ×2 IMPLANT
SUCTION FRAZIER HANDLE 10FR (MISCELLANEOUS) ×1
SUCTION TUBE FRAZIER 10FR DISP (MISCELLANEOUS) ×1 IMPLANT
SUT DVC 2 QUILL PDO  T11 36X36 (SUTURE) ×1
SUT DVC 2 QUILL PDO T11 36X36 (SUTURE) ×1 IMPLANT
SUT DVC QUILL MONODERM 30X30 (SUTURE) ×2 IMPLANT
SYR 20CC LL (SYRINGE) ×2 IMPLANT
SYR 50ML LL SCALE MARK (SYRINGE) ×4 IMPLANT
SYRINGE 10CC LL (SYRINGE) ×2 IMPLANT
TIBIAL BONE MODEL LEFT (MISCELLANEOUS) ×2 IMPLANT
TOWEL OR 17X26 4PK STRL BLUE (TOWEL DISPOSABLE) ×2 IMPLANT
TOWER CARTRIDGE SMART MIX (DISPOSABLE) ×2 IMPLANT
WRAPON POLAR PAD KNEE (MISCELLANEOUS) ×2

## 2016-09-21 NOTE — H&P (Signed)
Reviewed paper H+P, will be scanned into chart. No changes noted.  

## 2016-09-21 NOTE — Transfer of Care (Signed)
Immediate Anesthesia Transfer of Care Note  Patient: Daniel Luna  Procedure(s) Performed: Procedure(s): TOTAL KNEE ARTHROPLASTY (Left)  Patient Location: PACU  Anesthesia Type:Spinal  Level of Consciousness: awake  Airway & Oxygen Therapy: Patient Spontanous Breathing and Patient connected to nasal cannula oxygen  Post-op Assessment: Report given to RN and Post -op Vital signs reviewed and stable  Post vital signs: Reviewed and stable  Last Vitals:  Vitals:   09/21/16 1051  BP: (!) 155/94  Pulse: 81  Resp: 16  Temp: 36.5 C    Last Pain:  Vitals:   09/21/16 1051  TempSrc: Oral         Complications: No apparent anesthesia complications

## 2016-09-21 NOTE — NC FL2 (Signed)
[place] LEVEL OF CARE SCREENING TOOL     IDENTIFICATION  Patient Name: Daniel Luna Birthdate: 08/22/1941 Sex: male Admission Date (Current Location): 09/21/2016  [place] and [place] Number:  Engineering geologist and Address:  [facility], [address] [place], [place] [address]      Provider Number: [id number]  Attending Physician Name and Address:  Hessie Knows, MD  Relative Name and Phone Number:       Current Level of Care: Hospital Recommended Level of Care: [place] Prior Approval Number:    Date Approved/Denied:   PASRR Number:  ([id number])  Discharge Plan: SNF    Current Diagnoses: Patient Active Problem List   Diagnosis Date Noted  . Primary localized osteoarthritis of left knee 09/21/2016  . Coronary atherosclerosis of native coronary artery 07/12/2016  . Congestive heart failure ([place]) 07/12/2016  . History of coronary artery stent placement 07/12/2016  . Hyperlipidemia 07/12/2016  . Cardiomyopathy, ischemic 07/12/2016  . 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  . Total knee replacement status 05/21/2015  . Encounter for screening colonoscopy 12/25/2013  . Sebaceous cyst 08/15/2013  . Infected sebaceous cyst of skin 05/23/2013    Orientation RESPIRATION BLADDER Height & Weight     Self, Time, Situation, Place  Normal Continent Weight: 244 lb (110.7 kg) Height:  6\' 3"  (190.5 cm)  BEHAVIORAL SYMPTOMS/MOOD NEUROLOGICAL BOWEL NUTRITION STATUS   (none)  (none) Continent Diet (Diet: Carb Modified )  AMBULATORY STATUS COMMUNICATION OF NEEDS Skin   Extensive Assist Verbally Surgical wounds (Incision: Left Knee )                       Personal Care Assistance Level of Assistance  Bathing, Feeding, Dressing Bathing Assistance: Limited assistance Feeding assistance: Independent Dressing Assistance: Limited assistance     Functional Limitations Info  Sight, Hearing, Speech Sight Info: Adequate Hearing  Info: Adequate Speech Info: Adequate    SPECIAL CARE FACTORS FREQUENCY  PT (By licensed PT), OT (By licensed OT)     PT Frequency:  (5) OT Frequency:  (5)            Contractures      Additional Factors Info  Code Status, Allergies, Insulin Sliding Scale Code Status Info:  (Full Code. ) Allergies Info:  (Iodinated Diagnostic Agents)   Insulin Sliding Scale Info:  (NovoLog Insulin Injections 3 times per day. )       Current Medications (09/21/2016):  This is the current hospital active medication list Current Facility-Administered Medications  Medication Dose Route Frequency Provider Last Rate Last Dose  . 0.9 %  sodium chloride infusion   Intravenous Continuous Hessie Knows, MD      . acetaminophen (TYLENOL) tablet 650 mg  650 mg Oral Q6H PRN Hessie Knows, MD       Or  . acetaminophen (TYLENOL) suppository 650 mg  650 mg Rectal Q6H PRN Hessie Knows, MD      . aspirin EC tablet 81 mg  81 mg Oral Daily Hessie Knows, MD      . atorvastatin (LIPITOR) tablet 40 mg  40 mg Oral q1800 Hessie Knows, MD      . bisacodyl (DULCOLAX) suppository 10 mg  10 mg Rectal Daily PRN Hessie Knows, MD      . carvedilol (COREG) tablet 12.5 mg  12.5 mg Oral BID Hessie Knows, MD      . ceFAZolin (ANCEF) 2-4 GM/100ML-% IVPB           .  ceFAZolin (ANCEF) IVPB 2g/100 mL premix  2 g Intravenous Q6H Hessie Knows, MD      . diphenhydrAMINE (BENADRYL) 12.5 MG/5ML elixir 12.5-25 mg  12.5-25 mg Oral Q4H PRN Hessie Knows, MD      . docusate sodium (COLACE) capsule 100 mg  100 mg Oral BID Hessie Knows, MD      . Derrill Memo ON 09/22/2016] enoxaparin (LOVENOX) injection 30 mg  30 mg Subcutaneous Q12H Hessie Knows, MD      . furosemide (LASIX) tablet 20 mg  20 mg Oral Daily Hessie Knows, MD      . insulin aspart (novoLOG) injection 0-15 Units  0-15 Units Subcutaneous TID WC Hessie Knows, MD      . Derrill Memo ON 09/22/2016] lisinopril (PRINIVIL,ZESTRIL) tablet 40 mg  40 mg Oral Daily Hessie Knows, MD      . magnesium  citrate solution 1 Bottle  1 Bottle Oral Once PRN Hessie Knows, MD      . magnesium hydroxide (MILK OF MAGNESIA) suspension 30 mL  30 mL Oral Daily PRN Hessie Knows, MD      . menthol-cetylpyridinium (CEPACOL) lozenge 3 mg  1 lozenge Oral PRN Hessie Knows, MD       Or  . phenol (CHLORASEPTIC) mouth spray 1 spray  1 spray Mouth/Throat PRN Hessie Knows, MD      . metFORMIN (GLUCOPHAGE) tablet 1,000 mg  1,000 mg Oral BID WC Hessie Knows, MD      . methocarbamol (ROBAXIN) tablet 500 mg  500 mg Oral Q6H PRN Hessie Knows, MD       Or  . methocarbamol (ROBAXIN) 500 mg in dextrose 5 % 50 mL IVPB  500 mg Intravenous Q6H PRN Hessie Knows, MD      . metoCLOPramide (REGLAN) tablet 5-10 mg  5-10 mg Oral Q8H PRN Hessie Knows, MD       Or  . metoCLOPramide (REGLAN) injection 5-10 mg  5-10 mg Intravenous Q8H PRN Hessie Knows, MD      . morphine 2 MG/ML injection 2 mg  2 mg Intravenous Q1H PRN Hessie Knows, MD      . multivitamin-lutein (OCUVITE-LUTEIN) capsule 1 capsule  1 capsule Oral BID Hessie Knows, MD      . ondansetron [facility]) tablet 4 mg  4 mg Oral Q6H PRN Hessie Knows, MD       Or  . ondansetron [facility]) injection 4 mg  4 mg Intravenous Q6H PRN Hessie Knows, MD      . oxyCODONE (Oxy IR/ROXICODONE) immediate release tablet 5-10 mg  5-10 mg Oral Q3H PRN Hessie Knows, MD      . potassium chloride (K-DUR,KLOR-CON) CR tablet 10 mEq  10 mEq Oral Daily Hessie Knows, MD      . zolpidem [facility]) tablet 5 mg  5 mg Oral QHS PRN Hessie Knows, MD         Discharge Medications: Please see discharge summary for a list of discharge medications.  Relevant Imaging Results:  Relevant Lab Results:   Additional Information  (SSN: [id number])  Joey Lierman, Veronia Beets, LCSW

## 2016-09-21 NOTE — Progress Notes (Signed)
Pt requested and received oxycodone for increasing pain. Pt has full movement of both feet, pain is to the back of his L leg. This Probation officer reviewed with pt and his wife the meds he has available for pain/discomfort.

## 2016-09-21 NOTE — Op Note (Signed)
09/21/2016  2:18 PM  PATIENT:  Daniel Luna  75 y.o. male  PRE-OPERATIVE DIAGNOSIS:  primary osteoarthritis left knee  POST-OPERATIVE DIAGNOSIS:  primary osteoarthritis left knee  PROCEDURE:  Procedure(s): TOTAL KNEE ARTHROPLASTY (Left)  SURGEON: Laurene Footman, MD  ASSISTANTS: Rachelle Hora [facility]  ANESTHESIA:   spinal  EBL:  Total I/O In: 1300 [I.V.:1300] Out: 400 [Urine:200; Blood:200]  BLOOD ADMINISTERED:none  DRAINS: none   LOCAL MEDICATIONS USED:  MARCAINE    and OTHER Exparel, Morphine  SPECIMEN:  No Specimen  DISPOSITION OF SPECIMEN:  N/A  COUNTS:  YES  TOURNIQUET:   22 minutes at 300 mmHg   IMPLANTS: Medacta GMK sphere system, left 5+ femur, left 5 tibia with 10 mm insert and 3 patella, all components cemented  DICTATION: .Dragon Dictation patient brought the operating room and after adequate anesthesia was obtained the left leg was prepped and draped in sterile fashion. After patient identification and timeout procedures were completed, a midline skin incision was made followed by me approach at patellar arthrotomy. Inspection revealed extensive suprapatellar synovitis and exposed bone in all compartments with advanced degenerative changes present. The anterior cruciate ligament PCL and fat pad were excised. The proximal tibia was exposed to allow for application the Medacta cutting guide and proximal tibia cut carried out with bone removed. Next the distal femur was approached in identical fashion with the distal femoral cuts made. The 4-in-1 cutting block was applied and anterior posterior and chamfer cuts made. At this point the residual posterior horns of the menisci were excised as well. With trials placed the 10 mm insert was too tight so the tip probe tibia was recut taking an additional 2 mm trialing off of this gave appropriate tightness to the joint. The proximal tibia was prepared by pinning the 5 template drilling and placing the keel punch the femur after  trials fit well distal femoral drill holes were made and the notch cut made in the distal femur. The patella was cut using the patellar cutting guide was quite thin proximal May 23 millimeters and a proximal he 7 mm resected. After drilling holes for the patella it was cyst sized to a size 3. At this point the local anesthetic listed above was injected. The tourniquet was raised and the bony surfaces thoroughly irrigated and dried. The tibial component was cemented into place first followed by the the tibial insert being screwed into place with set screw with the torque screwdriver followed by the femoral component. The knee is held in extension as the cement set the patellar button was clamped in place. After the cement had set and excess cemented been removed turned was let down and a lateral releases carried out because of the slight valgus deformity and the patella rode very far laterally. Following release the patella tracked well in the midline with no touch technique. The arthrotomy was then repaired using a heavy Quill followed by injection of the joint with TXA it directly anterior into the joint. This was followed by closing the skin with 2-0 Quill and skin staples. Xeroform 4 x 4's ABDs and web roll and Ace wrap applied along Polar Care  PLAN OF CARE: Admit to inpatient   PATIENT DISPOSITION:  PACU - hemodynamically stable.

## 2016-09-21 NOTE — Anesthesia Procedure Notes (Signed)
Date/Time: 09/21/2016 12:00 PM Performed by: Allean Found Pre-anesthesia Checklist: Emergency Drugs available, Suction available, Patient identified, Patient being monitored and Timeout performed Patient Re-evaluated:Patient Re-evaluated prior to inductionOxygen Delivery Method: Simple face mask Dental Injury: Teeth and Oropharynx as per pre-operative assessment

## 2016-09-21 NOTE — Anesthesia Procedure Notes (Signed)
Date/Time: 09/21/2016 11:59 AM Performed by: Nelda Marseille Pre-anesthesia Checklist: Emergency Drugs available, Patient identified, Suction available, Patient being monitored and Timeout performed Oxygen Delivery Method: Simple face mask

## 2016-09-21 NOTE — Anesthesia Procedure Notes (Signed)
Spinal  Patient location during procedure: OR Start time: 09/21/2016 11:40 AM End time: 09/21/2016 11:50 AM Staffing Resident/CRNA: Nelda Marseille Performed: resident/CRNA  Preanesthetic Checklist Completed: patient identified, site marked, surgical consent, pre-op evaluation, timeout performed, IV checked, risks and benefits discussed and monitors and equipment checked Spinal Block Patient position: sitting Prep: Betadine Patient monitoring: heart rate, continuous pulse ox, blood pressure and cardiac monitor Approach: midline Location: L3-4 Injection technique: single-shot Needle Needle type: Whitacre and Introducer  Needle gauge: 24 G Needle length: 9 cm Additional Notes Negative paresthesia. Negative blood return. Positive free-flowing CSF. Expiration date of kit checked and confirmed. Patient tolerated procedure well, without complications.

## 2016-09-21 NOTE — Anesthesia Preprocedure Evaluation (Signed)
Anesthesia Evaluation  Patient identified by MRN, date of birth, ID band Patient awake    Reviewed: Allergy & Precautions, NPO status , Patient's Chart, lab work & pertinent test results, reviewed documented beta blocker date and time   Airway Mallampati: III  TM Distance: >3 FB Neck ROM: Full    Dental   Pulmonary shortness of breath and with exertion, former smoker,    Pulmonary exam normal        Cardiovascular hypertension, Pt. on medications and Pt. on home beta blockers + CAD and +CHF  Normal cardiovascular exam+ dysrhythmias      Neuro/Psych negative neurological ROS  negative psych ROS   GI/Hepatic negative GI ROS, Neg liver ROS,   Endo/Other  diabetes, Well Controlled, Type 2, Oral Hypoglycemic Agents  Renal/GU negative Renal ROS  negative genitourinary   Musculoskeletal  (+) Arthritis ,   Abdominal Normal abdominal exam  (+)   Peds negative pediatric ROS (+)  Hematology negative hematology ROS (+)   Anesthesia Other Findings   Reproductive/Obstetrics                             Anesthesia Physical  Anesthesia Plan  ASA: III  Anesthesia Plan: Spinal   Post-op Pain Management:    Induction: Intravenous  Airway Management Planned: Nasal Cannula  Additional Equipment:   Intra-op Plan:   Post-operative Plan:   Informed Consent: I have reviewed the patients History and Physical, chart, labs and discussed the procedure including the risks, benefits and alternatives for the proposed anesthesia with the patient or authorized representative who has indicated his/her understanding and acceptance.   Dental advisory given  Plan Discussed with: CRNA and Surgeon  Anesthesia Plan Comments:         Anesthesia Quick Evaluation

## 2016-09-21 NOTE — Progress Notes (Signed)
Pt arrived from pacu via bed at approx 1515. Pt is awake, alert and oriented, stated he has no pain, cannot feel much below his waist, but is beginning to be able to feel the bottom of his feet. Pt is on room air, lungs are clear bilat, hr is irregular, abdomen is soft, bs hypoactive. Foley intact and draining yellow urine, secured to R leg. R leg has thigh high ted intact. L leg has bulky dressing and ace wrap from mid thigh to below the knee, polar care is intact, dressings are dry. Foot pumps on bilat and L leg placed in bone foam per order. PIV #20 intact to L hand with iv ns infusing at 76mls/hr, site is free of redness and swelling. Wife at bedside. Skin check done with Audry,RN, pt skin is intact. Pt is oriented ot room, MD orders reviewed with pt and his wife.

## 2016-09-22 LAB — BASIC METABOLIC PANEL
ANION GAP: 6 (ref 5–15)
BUN: 18 mg/dL (ref 6–20)
CO2: 25 mmol/L (ref 22–32)
Calcium: 8.9 mg/dL (ref 8.9–10.3)
Chloride: 105 mmol/L (ref 101–111)
Creatinine, Ser: 1.17 mg/dL (ref 0.61–1.24)
GFR calc Af Amer: >60 mL/min (ref >60)
GFR calc non Af Amer: >60 mL/min (ref >60)
GLUCOSE: 176 mg/dL — AB (ref 65–99)
POTASSIUM: 3.9 mmol/L (ref 3.5–5.1)
SODIUM: 136 mmol/L (ref 135–145)

## 2016-09-22 LAB — GLUCOSE, CAPILLARY
GLUCOSE-CAPILLARY: 175 mg/dL — AB (ref 65–99)
Glucose-Capillary: 168 mg/dL — ABNORMAL HIGH (ref 65–99)
Glucose-Capillary: 183 mg/dL — ABNORMAL HIGH (ref 65–99)
Glucose-Capillary: 147 mg/dL — ABNORMAL HIGH (ref 65–99)

## 2016-09-22 LAB — CBC
HCT: 36.3 % — ABNORMAL LOW (ref 40.0–52.0)
HEMOGLOBIN: 12.8 g/dL — AB (ref 13.0–18.0)
MCH: 32.8 pg (ref 26.0–34.0)
MCHC: 35.2 g/dL (ref 32.0–36.0)
MCV: 93.2 fL (ref 80.0–100.0)
PLATELETS: 124 10*3/uL — AB (ref 150–440)
RBC: 3.9 MIL/uL — ABNORMAL LOW (ref 4.40–5.90)
RDW: 12.9 % (ref 11.5–14.5)
WBC: 8.7 10*3/uL (ref 3.8–10.6)

## 2016-09-22 NOTE — Care Management Note (Signed)
Case Management Note  Patient Details  Name: Daniel Luna MRN: [id number] Date of Birth: 03-24-1941  Subjective/Objective: POD 1 left TKA. Met with patient at bedside. He is open to home health with no agency preference. Referral to [facility] for home health PT. Pharmacy: [facility] [phone]. Called Lovenox 40 mg SQ #14, no refills. He has a walker and a cane.Wife will be caretaker.              Action/Plan: [facility] for home PT. Lovenox called in  Expected Discharge Date:   09/23/2016               Expected Discharge Plan:  [place]  In-House Referral:     Discharge planning Services  CM Consult  Post Acute Care Choice:  Home Health Choice offered to:  Patient  DME Arranged:    DME Agency:     HH Arranged:  PT [place]:  [facility] (now [facility])  Status of Service:  In process, will continue to follow  If discussed at Long Length of Stay Meetings, dates discussed:    Additional Comments:  Jolly Mango, RN 09/22/2016, 2:37 PM

## 2016-09-22 NOTE — Progress Notes (Signed)
Physical Therapy Treatment Patient Details Name: Daniel Luna MRN: [id number] DOB: [birth year] Today's Date: 09/22/2016    History of Present Illness Pt is a 75 y/o M s/p L TKA.  Pt's PMH includes CHF, cyst of brain, macular degeneration, SOB, R TKA.     PT Comments    Daniel Luna is making good progress, ambulating 300 ft using RW in hallway.  He requires supervision for safety to do so.  Cues provided to relax shoulders and for upright posture.  Pt will benefit from continued skilled PT services to increase functional independence and safety.   Follow Up Recommendations  Home health PT;Supervision for mobility/OOB     Equipment Recommendations  3in1 (PT)    Recommendations for Other Services       Precautions / Restrictions Precautions Precautions: Fall;Knee Precaution Booklet Issued: Yes (comment) Precaution Comments: Reviewed maintaining knee extension while seated or supine Restrictions Weight Bearing Restrictions: Yes LLE Weight Bearing: Weight bearing as tolerated    Mobility  Bed Mobility Overal bed mobility: Needs Assistance Bed Mobility: Supine to Sit     Supine to sit: Supervision;HOB elevated     General bed mobility comments: HOB and pt requires increased time and effort.  Use of bed rail, supervision for safety and cues for technique.  Transfers Overall transfer level: Needs assistance Equipment used: Rolling walker (2 wheeled) Transfers: Sit to/from Stand Sit to Stand: Min guard         General transfer comment: Cues for hand placement and proper technique.  Pt uses momentum to achieve standing.  Ambulation/Gait Ambulation/Gait assistance: Supervision Ambulation Distance (Feet): 300 Feet Assistive device: Rolling walker (2 wheeled) Gait Pattern/deviations: Step-through pattern;Decreased stride length;Trunk flexed Gait velocity: decreased   General Gait Details: Cues for upright posture and to relax shoulders to accept more weight through LEs  rather than relying so heavily on RW.     Stairs            Wheelchair Mobility    Modified Rankin (Stroke Patients Only)       Balance Overall balance assessment: Needs assistance Sitting-balance support: No upper extremity supported;Feet supported Sitting balance-Leahy Scale: Good     Standing balance support: Bilateral upper extremity supported;During functional activity Standing balance-Leahy Scale: Poor Standing balance comment: Relies on RW for support                    Cognition Arousal/Alertness: Awake/alert Behavior During Therapy: WFL for tasks assessed/performed Overall Cognitive Status: Within Functional Limits for tasks assessed                      Exercises Total Joint Exercises Ankle Circles/Pumps: AROM;Both;10 reps;Seated Quad Sets: Strengthening;Both;5 reps;Seated Long Arc Quad: AROM;Left;Seated;10 reps Knee Flexion: AAROM;Left;Seated;Other (comment);10 reps (with 5 second holds) Goniometric ROM: ~85 deg L knee flexion Other Exercises Other Exercises: Standing knee flexion x10 with RW for support Other Exercises: Lateral weight shifting x10 reps in standing prior to ambulating Other Exercises: Staggered stance weight shifting x10 with L foot forward and x10 with R foot forward to work on involuntary firing of glutes and quad.     General Comments        Pertinent Vitals/Pain Pain Assessment: Faces Pain Score: 4  Faces Pain Scale: Hurts little more Pain Location: L knee Pain Descriptors / Indicators: Grimacing Pain Intervention(s): Limited activity within patient's tolerance;Monitored during session;Repositioned;Ice applied    Home Living Family/patient expects to be discharged to:: Private residence Living Arrangements: Spouse/significant  other Available Help at Discharge: Family;Available 24 hours/day Type of Home: House Home Access: Stairs to enter Entrance Stairs-Rails: None Home Layout: One level Home Equipment:  Environmental consultant - 2 wheels;Cane - single point      Prior Function Level of Independence: Independent with assistive device(s)          PT Goals (current goals can now be found in the care plan section) Acute Rehab PT Goals Patient Stated Goal: to go home PT Goal Formulation: With patient Time For Goal Achievement: 09/29/16 Potential to Achieve Goals: Good Progress towards PT goals: Progressing toward goals    Frequency    BID      PT Plan Current plan remains appropriate    Co-evaluation             End of Session Equipment Utilized During Treatment: Gait belt Activity Tolerance: Patient tolerated treatment well Patient left: in chair;with call bell/phone within reach;with chair alarm set;Other (comment) (with polar ice in place)     Time: [id number] PT Time Calculation (min) (ACUTE ONLY): 24 min  Charges:  $Gait Training: 8-22 mins $Therapeutic Exercise: 8-22 mins                    G Codes:      Collie Siad PT, DPT 09/22/2016, 2:37 PM

## 2016-09-22 NOTE — Anesthesia Postprocedure Evaluation (Signed)
Anesthesia Post Note  Patient: Daniel Luna  Procedure(s) Performed: Procedure(s) (LRB): TOTAL KNEE ARTHROPLASTY (Left)  Patient location during evaluation: Nursing Unit Anesthesia Type: Spinal Level of consciousness: awake and alert, oriented and patient cooperative Pain management: satisfactory to patient Vital Signs Assessment: post-procedure vital signs reviewed and stable Respiratory status: respiratory function stable and nonlabored ventilation Cardiovascular status: stable Postop Assessment: no headache, no backache, no signs of nausea or vomiting and adequate PO intake Anesthetic complications: no    Last Vitals:  Vitals:   09/21/16 2337 09/22/16 0334  BP: (!) 174/99 (!) 156/92  Pulse: 98 94  Resp: 18 19  Temp: 37.2 C 36.9 C    Last Pain:  Vitals:   09/22/16 0752  TempSrc:   PainSc: 3                  Silvana Newness A

## 2016-09-22 NOTE — Progress Notes (Signed)
Clinical Social Worker (CSW) received SNF consult. PT is recommending home health. RN case manager is aware of above. Please reconsult if future social work needs arise. CSW signing off.   Tywanda Rice, LCSW [phone] 

## 2016-09-22 NOTE — Progress Notes (Signed)
   Subjective: 1 Day Post-Op Procedure(s) (LRB): TOTAL KNEE ARTHROPLASTY (Left) Patient reports pain as 5 on 0-10 scale.   Patient is well, and has had no acute complaints or problems Denies any CP, SOB, ABD pain. We will start therapy today.    Objective: Vital signs in last 24 hours: Temp:  [97.7 F (36.5 C)-99 F (37.2 C)] 98.4 F (36.9 C) (10/11 0334) Pulse Rate:  [68-116] 116 (10/11 0820) Resp:  [13-19] 19 (10/11 0820) BP: (80-179)/(48-99) 179/95 (10/11 0820) SpO2:  [95 %-100 %] 95 % (10/11 0820) Weight:  [110.7 kg (244 lb)] 110.7 kg (244 lb) (10/10 1051)  Intake/Output from previous day: 10/10 0701 - 10/11 0700 In: 2090 [P.O.:240; I.V.:1850] Out: 1400 [Urine:1200; Blood:200] Intake/Output this shift: Total I/O In: 143.8 [I.V.:143.8] Out: -    Recent Labs  09/21/16 1553 09/22/16 0339  HGB 13.4 12.8*    Recent Labs  09/21/16 1553 09/22/16 0339  WBC 6.8 8.7  RBC 4.12* 3.90*  HCT 38.7* 36.3*  PLT 130* 124*    Recent Labs  09/21/16 1553 09/22/16 0339  NA  --  136  K  --  3.9  CL  --  105  CO2  --  25  BUN  --  18  CREATININE 1.13 1.17  GLUCOSE  --  176*  CALCIUM  --  8.9   No results for input(s): LABPT, INR in the last 72 hours.  EXAM General - Patient is Alert, Appropriate and Oriented Extremity - Neurovascular intact Sensation intact distally Intact pulses distally Dorsiflexion/Plantar flexion intact Dressing - dressing C/D/I and no drainage Motor Function - intact, moving foot and toes well on exam.   Past Medical History:  Diagnosis Date  . Arthritis   . CHF (congestive heart failure) ([place])   . Coronary artery disease    CAD s/p PCI to LAD and RCA 01/2012   . Cyst of brain 1983  . Diabetes mellitus without complication ([place])   . Dysrhythmia   . Heart failure ([place])   . Hyperlipidemia   . Hypertension   . Ischemic cardiomyopathy    LVEF 123XX123 % (systolic and diastolic, NYHA I-II symptoms)   . Macular degeneration   . Shortness  of breath dyspnea     Assessment/Plan:   1 Day Post-Op Procedure(s) (LRB): TOTAL KNEE ARTHROPLASTY (Left) Active Problems:   Primary localized osteoarthritis of left knee  Estimated body mass index is 30.5 kg/m as calculated from the following:   Height as of this encounter: 6\' 3"  (1.905 m).   Weight as of this encounter: 110.7 kg (244 lb). Advance diet Up with therapy  Needs BM Acute post op blood loss anemia - recheck labs in the am CM to assist with discharge.   DVT Prophylaxis - Lovenox, Foot Pumps and TED hose Weight-Bearing as tolerated to left leg   T. Rachelle Hora, PA-C [place] 09/22/2016, 8:24 AM

## 2016-09-22 NOTE — Evaluation (Signed)
Occupational Therapy Evaluation Patient Details Name: Daniel Luna MRN: [id number] DOB: 01/18/1941 Today's Date: 09/22/2016    History of Present Illness Pt. is a 75 y.o. male who was admitted for a Left TKR.   Clinical Impression   Pt. Is a 75 y.o. Male who was admitted for a Left TKR. Pt presents with limited ROM, pain, weakness, and impaired functional mobility which hinder her ability to complete ADL and IADL tasks. Pt. could benefit from skilled OT services to review A/E use for LE ADLs, to review necessary home modifications, and to improve functional mobility for ADL/IADLs in order to work towards regaining Independence with ADL/IADLs.     Follow Up Recommendations  Home health OT    Equipment Recommendations       Recommendations for Other Services       Precautions / Restrictions Precautions Precautions: Fall;Knee Precaution Booklet Issued: Yes (comment) Precaution Comments: Reviewed maintaining knee extension while seated or supine Restrictions Weight Bearing Restrictions: Yes LLE Weight Bearing: Weight bearing as tolerated         Balance Overall balance assessment: Needs assistance Sitting-balance support: No upper extremity supported;Feet supported Sitting balance-Leahy Scale: Good                          ADL Overall ADL's : Needs assistance/impaired Eating/Feeding: Set up   Grooming: Set up               Lower Body Dressing: Moderate assistance                 General ADL Comments: Pt. education was provided about A/E use for LE ADLs.     Vision     Perception     Praxis      Pertinent Vitals/Pain Pain Assessment: 0-10 Pain Score: 4  Pain Location: Left Knee Pain Descriptors / Indicators: Aching;Sore Pain Intervention(s): Limited activity within patient's tolerance     Hand Dominance Left   Extremity/Trunk Assessment Upper Extremity Assessment Upper Extremity Assessment: Defer to OT evaluation   Cervical /  Trunk Assessment Cervical / Trunk Assessment: Normal   Communication Communication Communication: No difficulties   Cognition Arousal/Alertness: Awake/alert Behavior During Therapy: WFL for tasks assessed/performed Overall Cognitive Status: Within Functional Limits for tasks assessed                     General Comments       Exercises     Shoulder Instructions      Home Living Family/patient expects to be discharged to:: Private residence Living Arrangements: Spouse/significant other Available Help at Discharge: Family;Available 24 hours/day Type of Home: House Home Access: Stairs to enter [employer] of Steps: 3 Entrance Stairs-Rails: None Home Layout: One level     Bathroom Shower/Tub: Tub/shower unit         Home Equipment: Environmental consultant - 2 wheels;Cane - single point          Prior Functioning/Environment Level of Independence: Independent with assistive device(s)        Comments: Occasionally using SPC over the past 2 weeks due to pain        OT Problem List: Decreased strength;Pain;Decreased activity tolerance   OT Treatment/Interventions: Self-care/ADL training;Therapeutic exercise;Patient/family education;DME and/or AE instruction    OT Goals(Current goals can be found in the care plan section) Acute Rehab OT Goals Patient Stated Goal: to go home  OT Frequency: Min 1X/week   Barriers to D/C:  Co-evaluation              End of Session    Activity Tolerance: Patient tolerated treatment well Patient left: in chair;with chair alarm set;with call bell/phone within reach   Time: 1045-1100 OT Time Calculation (min): 15 min Charges:  OT General Charges $OT Visit: 1 Procedure OT Evaluation $OT Eval Moderate Complexity: 1 Procedure G-Codes:    Harrel Carina, MS, OTR/L 09/22/2016, 11:37 AM

## 2016-09-22 NOTE — Evaluation (Signed)
Physical Therapy Evaluation Patient Details Name: Daniel Luna MRN: [id number] DOB: 1941/05/27 Today's Date: 09/22/2016   History of Present Illness  Pt is a 75 y/o M s/p L TKA.  Pt's PMH includes CHF, cyst of brain, macular degeneration, SOB, R TKA.   Clinical Impression  Pt is s/p L TKA resulting in the deficits listed below (see PT Problem List). Daniel Luna ambulated 150 ft using RW with supervision for safety.  He requires cues for safe technique and hand placement with sit<>stand transfers.  He tolerated all therapeutic exercises well this session.  He will have 24/7 supervision/assist available from his wife at d/c. Pt will benefit from skilled PT to increase their independence and safety with mobility to allow discharge to the venue listed below.     Follow Up Recommendations Home health PT;Supervision for mobility/OOB    Equipment Recommendations  3in1 (PT)    Recommendations for Other Services OT consult     Precautions / Restrictions Precautions Precautions: Fall;Knee Precaution Booklet Issued: Yes (comment) Precaution Comments: Reviewed maintaining knee extension while seated or supine Restrictions Weight Bearing Restrictions: Yes LLE Weight Bearing: Weight bearing as tolerated      Mobility  Bed Mobility Overal bed mobility: Needs Assistance Bed Mobility: Supine to Sit     Supine to sit: Supervision;HOB elevated     General bed mobility comments: HOB and pt requires increased time and effort.  Use of bed rail, supervision for safety and cues for technique.  Transfers Overall transfer level: Needs assistance Equipment used: Rolling walker (2 wheeled) Transfers: Sit to/from Stand Sit to Stand: Min guard         General transfer comment: Cues for hand placement and proper technique.  Pt slow to stand.   Ambulation/Gait Ambulation/Gait assistance: Supervision Ambulation Distance (Feet): 150 Feet Assistive device: Rolling walker (2 wheeled) Gait  Pattern/deviations: Step-through pattern;Decreased stance time - left;Decreased step length - right;Decreased weight shift to left;Antalgic Gait velocity: decreased   General Gait Details: Cues for upright posture.  Pt initiates step through pattern without cues.  Supervision for safety  Stairs            Wheelchair Mobility    Modified Rankin (Stroke Patients Only)       Balance Overall balance assessment: Needs assistance Sitting-balance support: No upper extremity supported;Feet supported Sitting balance-Leahy Scale: Good     Standing balance support: Bilateral upper extremity supported;During functional activity Standing balance-Leahy Scale: Poor Standing balance comment: Relies on RW for support                             Pertinent Vitals/Pain Pain Assessment: 0-10 Pain Score: 7  Pain Location: L knee Pain Descriptors / Indicators: Aching;Sore Pain Intervention(s): Limited activity within patient's tolerance;Monitored during session;Repositioned;Ice applied;RN gave pain meds during session;Relaxation    Home Living Family/patient expects to be discharged to:: Private residence Living Arrangements: Spouse/significant other Available Help at Discharge: Family;Available 24 hours/day Type of Home: House Home Access: Stairs to enter Entrance Stairs-Rails: None Entrance Stairs-Number of Steps: 3 Home Layout: One level Home Equipment: Walker - 2 wheels;Cane - single point      Prior Function Level of Independence: Independent with assistive device(s)         Comments: Occasionally using SPC over the past 2 weeks due to pain     Hand Dominance        Extremity/Trunk Assessment   Upper Extremity Assessment: Defer to OT  evaluation           Lower Extremity Assessment: LLE deficits/detail   LLE Deficits / Details: limited strength and ROM as expected s/p L TKA  Cervical / Trunk Assessment: Normal  Communication   Communication: No  difficulties  Cognition Arousal/Alertness: Awake/alert Behavior During Therapy: WFL for tasks assessed/performed Overall Cognitive Status: Within Functional Limits for tasks assessed                      General Comments      Exercises Total Joint Exercises Ankle Circles/Pumps: AROM;Both;10 reps;Seated Quad Sets: Strengthening;Both;10 reps;Supine Straight Leg Raises: AAROM;AROM;Left;5 reps;Seated Long Arc Quad: AROM;Left;5 reps;Seated Knee Flexion: AAROM;Left;5 reps;Seated;Other (comment) (with 5 second holds) Goniometric ROM: ~70 deg L knee flexion   Assessment/Plan    PT Assessment Patient needs continued PT services  PT Problem List Decreased strength;Decreased range of motion;Decreased activity tolerance;Decreased balance;Decreased mobility;Decreased knowledge of use of DME;Decreased safety awareness;Decreased knowledge of precautions;Pain          PT Treatment Interventions DME instruction;Gait training;Stair training;Functional mobility training;Therapeutic activities;Therapeutic exercise;Balance training;Neuromuscular re-education;Patient/family education;Modalities    PT Goals (Current goals can be found in the Care Plan section)  Acute Rehab PT Goals Patient Stated Goal: to go home PT Goal Formulation: With patient Time For Goal Achievement: 09/29/16 Potential to Achieve Goals: Good    Frequency BID   Barriers to discharge Inaccessible home environment 3 steps without rails to enter home    Co-evaluation               End of Session Equipment Utilized During Treatment: Gait belt Activity Tolerance: Patient tolerated treatment well Patient left: in chair;with call bell/phone within reach;with chair alarm set;Other (comment) (with polar ice in place) Nurse Communication: Mobility status;Weight bearing status;Precautions         Time: [id number] PT Time Calculation (min) (ACUTE ONLY): 38 min   Charges:   PT Evaluation $PT Eval Low  Complexity: 1 Procedure PT Treatments $Gait Training: 8-22 mins   PT G Codes:       Collie Siad PT, DPT 09/22/2016, 10:25 AM

## 2016-09-23 ENCOUNTER — Encounter: Payer: Self-pay | Admitting: Orthopedic Surgery

## 2016-09-23 LAB — BASIC METABOLIC PANEL
Anion gap: 7 (ref 5–15)
BUN: 20 mg/dL (ref 6–20)
CO2: 26 mmol/L (ref 22–32)
Calcium: 8.7 mg/dL — ABNORMAL LOW (ref 8.9–10.3)
Chloride: 100 mmol/L — ABNORMAL LOW (ref 101–111)
Creatinine, Ser: 1.09 mg/dL (ref 0.61–1.24)
GFR calc Af Amer: >60 mL/min (ref >60)
GLUCOSE: 181 mg/dL — AB (ref 65–99)
POTASSIUM: 3.7 mmol/L (ref 3.5–5.1)
Sodium: 133 mmol/L — ABNORMAL LOW (ref 135–145)

## 2016-09-23 LAB — GLUCOSE, CAPILLARY
GLUCOSE-CAPILLARY: 120 mg/dL — AB (ref 65–99)
Glucose-Capillary: 163 mg/dL — ABNORMAL HIGH (ref 65–99)

## 2016-09-23 LAB — CBC
HEMATOCRIT: 35 % — AB (ref 40.0–52.0)
Hemoglobin: 12.3 g/dL — ABNORMAL LOW (ref 13.0–18.0)
MCH: 32.6 pg (ref 26.0–34.0)
MCHC: 35.1 g/dL (ref 32.0–36.0)
MCV: 93 fL (ref 80.0–100.0)
PLATELETS: 114 10*3/uL — AB (ref 150–440)
RBC: 3.77 MIL/uL — AB (ref 4.40–5.90)
RDW: 13.3 % (ref 11.5–14.5)
WBC: 9.4 10*3/uL (ref 3.8–10.6)

## 2016-09-23 MED ORDER — OXYCODONE HCL 5 MG PO TABS: 5.0000 mg | ORAL_TABLET | ORAL | 0 refills | Status: DC | PRN

## 2016-09-23 MED ORDER — ENOXAPARIN SODIUM 40 MG/0.4ML ~~LOC~~ SOLN: 40.0000 mg | SUBCUTANEOUS | 0 refills | Status: DC

## 2016-09-23 NOTE — Care Management Important Message (Signed)
Important Message  Patient Details  Name: JAMESEN SCALONE MRN: [id number] Date of Birth: 1941/08/11   Medicare Important Message Given:  Yes    Jolly Mango, RN 09/23/2016, 9:48 AM

## 2016-09-23 NOTE — Progress Notes (Signed)
   Subjective: 2 Days Post-Op Procedure(s) (LRB): TOTAL KNEE ARTHROPLASTY (Left) Patient reports pain as 1 on 0-10 scale.   Patient is well, and has had no acute complaints or problems Denies any CP, SOB, ABD pain. We will continue therapy today.    Objective: Vital signs in last 24 hours: Temp:  [97.8 F (36.6 C)-98.9 F (37.2 C)] 98.5 F (36.9 C) (10/12 0443) Pulse Rate:  [82-92] 82 (10/12 0754) Resp:  [16-18] 18 (10/12 0754) BP: (123-166)/(61-95) 123/61 (10/12 0754) SpO2:  [96 %-99 %] 98 % (10/12 0754)  Intake/Output from previous day: 10/11 0701 - 10/12 0700 In: 2003.8 [P.O.:1220; I.V.:783.8] Out: 725 [Urine:725] Intake/Output this shift: No intake/output data recorded.   Recent Labs  09/21/16 1553 09/22/16 0339 09/23/16 0403  HGB 13.4 12.8* 12.3*    Recent Labs  09/22/16 0339 09/23/16 0403  WBC 8.7 9.4  RBC 3.90* 3.77*  HCT 36.3* 35.0*  PLT 124* 114*    Recent Labs  09/22/16 0339 09/23/16 0403  NA 136 133*  K 3.9 3.7  CL 105 100*  CO2 25 26  BUN 18 20  CREATININE 1.17 1.09  GLUCOSE 176* 181*  CALCIUM 8.9 8.7*   No results for input(s): LABPT, INR in the last 72 hours.  EXAM General - Patient is Alert, Appropriate and Oriented Extremity - Neurovascular intact Sensation intact distally Intact pulses distally Dorsiflexion/Plantar flexion intact Dressing - dressing C/D/I and no drainage, honeycomb dressing applied Motor Function - intact, moving foot and toes well on exam.   Past Medical History:  Diagnosis Date  . Arthritis   . CHF (congestive heart failure) ([place])   . Coronary artery disease    CAD s/p PCI to LAD and RCA 01/2012   . Cyst of brain 1983  . Diabetes mellitus without complication ([place])   . Dysrhythmia   . Heart failure ([place])   . Hyperlipidemia   . Hypertension   . Ischemic cardiomyopathy    LVEF 123XX123 % (systolic and diastolic, NYHA I-II symptoms)   . Macular degeneration   . Shortness of breath dyspnea      Assessment/Plan:   2 Days Post-Op Procedure(s) (LRB): TOTAL KNEE ARTHROPLASTY (Left) Active Problems:   Primary localized osteoarthritis of left knee  Estimated body mass index is 30.5 kg/m as calculated from the following:   Height as of this encounter: 6\' 3"  (1.905 m).   Weight as of this encounter: 110.7 kg (244 lb). Advance diet Up with therapy  Acute post op blood loss anemia - Hgb stable 12.3 CM to assist with discharge.  Possible discharge home with HHPT today after PT.   DVT Prophylaxis - Lovenox, Foot Pumps and TED hose Weight-Bearing as tolerated to left leg   T. Rachelle Hora, PA-C [place] 09/23/2016, 9:01 AM

## 2016-09-23 NOTE — Progress Notes (Signed)
Pt being discharged home today. PIV removed. Discharge instructions reviewed with pt, all questions answered. Prescriptions given to pt to have filled. Educated on proper administration for lovenox injections, return demonstration and verbal understanding given. He with have PT at home with [facility]. He is leaving with all his belongings, will be transported home via family member.

## 2016-09-23 NOTE — Discharge Summary (Signed)
Physician Discharge Summary  Patient ID: Daniel Luna MRN: [id number] DOB/AGE: [birth year] 75 y.o.  Admit date: 09/21/2016 Discharge date: 09/23/2016  Admission Diagnoses:  primary osteoarthritis   Discharge Diagnoses: Patient Active Problem List   Diagnosis Date Noted  . Primary localized osteoarthritis of left knee 09/21/2016  . Coronary atherosclerosis of native coronary artery 07/12/2016  . Congestive heart failure ([place]) 07/12/2016  . History of coronary artery stent placement 07/12/2016  . Hyperlipidemia 07/12/2016  . Cardiomyopathy, ischemic 07/12/2016  . 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  . Total knee replacement status 05/21/2015  . Encounter for screening colonoscopy 12/25/2013  . Sebaceous cyst 08/15/2013  . Infected sebaceous cyst of skin 05/23/2013    Past Medical History:  Diagnosis Date  . Arthritis   . CHF (congestive heart failure) ([place])   . Coronary artery disease    CAD s/p PCI to LAD and RCA 01/2012   . Cyst of brain 1983  . Diabetes mellitus without complication ([place])   . Dysrhythmia   . Heart failure ([place])   . Hyperlipidemia   . Hypertension   . Ischemic cardiomyopathy    LVEF 123XX123 % (systolic and diastolic, NYHA I-II symptoms)   . Macular degeneration   . Shortness of breath dyspnea      Transfusion: none   Consultants (if any):   Discharged Condition: Improved  Hospital Course: Daniel Luna is an 75 y.o. male who was admitted 09/21/2016 with a diagnosis of left knee osteoarthritis and went to the operating room on 09/21/2016 and underwent the above named procedures.    Surgeries: Procedure(s): TOTAL KNEE ARTHROPLASTY on 09/21/2016 Patient tolerated the surgery well. Taken to PACU where she was stabilized and then transferred to the orthopedic floor.  Started on Lovenox 30 q 12 hrs. Foot pumps applied bilaterally at 80 mm. Heels elevated on bed with rolled towels. No evidence of DVT. Negative [place]. Physical therapy started on day #1 for gait  training and transfer. OT started day #1 for ADL and assisted devices.  Patient's foley was d/c on day #1. Patient's IV was d/c on day #2.  On post op day #2 patient was stable and ready for discharge to home with HHPT.  Implants: Medacta GMK sphere system, left 5+ femur, left 5 tibia with 10 mm insert and 3 patella, all components cemented  He was given perioperative antibiotics:  Anti-infectives    Start     Dose/Rate Route Frequency Ordered Stop   09/21/16 1800  ceFAZolin (ANCEF) IVPB 2g/100 mL premix     2 g 200 mL/hr over 30 Minutes Intravenous Every 6 hours 09/21/16 1545 09/21/16 2333   09/21/16 1035  ceFAZolin (ANCEF) 2-4 GM/100ML-% IVPB    Comments:  SCHNIER, JULIE: cabinet override      09/21/16 1035 09/21/16 2244   09/21/16 0045  ceFAZolin (ANCEF) IVPB 2g/100 mL premix     2 g 200 mL/hr over 30 Minutes Intravenous  Once 09/21/16 0030 09/21/16 1205    .  He was given sequential compression devices, early ambulation, and Lovenox for DVT prophylaxis.  He benefited maximally from the hospital stay and there were no complications.    Recent vital signs:  Vitals:   09/23/16 0443 09/23/16 0754  BP: 132/75 123/61  Pulse: 87 82  Resp: 16 18  Temp: 98.5 F (36.9 C)     Recent laboratory studies:  Lab Results  Component Value Date   HGB 12.3 (L) 09/23/2016   HGB 12.8 (L) 09/22/2016   HGB  13.4 09/21/2016   Lab Results  Component Value Date   WBC 9.4 09/23/2016   PLT 114 (L) 09/23/2016   Lab Results  Component Value Date   INR 0.98 09/08/2016   Lab Results  Component Value Date   NA 133 (L) 09/23/2016   K 3.7 09/23/2016   CL 100 (L) 09/23/2016   CO2 26 09/23/2016   BUN 20 09/23/2016   CREATININE 1.09 09/23/2016   GLUCOSE 181 (H) 09/23/2016    Discharge Medications:     Medication List    TAKE these medications   ACCU-CHEK FASTCLIX LANCETS Misc   ACCU-CHEK SMARTVIEW test strip Generic drug:  glucose blood   aspirin EC 81 MG tablet Take 81 mg by  mouth daily.   carvedilol 12.5 MG tablet Commonly known as:  COREG TAKE 1 TABLET BY MOUTH TWICE DAILY   enoxaparin 40 MG/0.4ML injection Commonly known as:  LOVENOX Inject 0.4 mLs (40 mg total) into the skin daily.   furosemide 20 MG tablet Commonly known as:  LASIX Take 1 tablet (20 mg total) by mouth daily.   lisinopril 40 MG tablet Commonly known as:  PRINIVIL,ZESTRIL Take 1 tablet (40 mg total) by mouth daily.   metFORMIN 1000 MG tablet Commonly known as:  GLUCOPHAGE Take 1,000 mg by mouth 2 (two) times daily with a meal.   oxyCODONE 5 MG immediate release tablet Commonly known as:  Oxy IR/ROXICODONE Take 1-2 tablets (5-10 mg total) by mouth every 3 (three) hours as needed for breakthrough pain.   potassium chloride SA 20 MEQ tablet Commonly known as:  K-DUR,KLOR-CON Take 0.5 tablets (10 mEq total) by mouth daily.   simvastatin 80 MG tablet Commonly known as:  ZOCOR Take 1 tablet (80 mg total) by mouth at bedtime.   VISION VITAMINS PO Take 1 tablet by mouth 2 (two) times daily.       Diagnostic Studies: Dg Knee 1-2 Views Left  Result Date: 09/21/2016 CLINICAL DATA:  Status post left total knee joint replacement. Assess hardware placement. EXAM: LEFT KNEE - 1-2 VIEW COMPARISON:  None in PACs FINDINGS: AP and lateral portable views reveal placement of the total knee joint prosthesis. The interface of the prosthetic components with the native bone appears normal. The native bone exhibits no acute abnormality. There is air and fluid within the anterior aspect of the knee joint space. Skin staples are present. There are vascular calcifications. IMPRESSION: No evidence of immediate postprocedure complication. Positioning of the prosthetic components is radiographically good. Electronically Signed   By: David  Martinique M.D.   On: 09/21/2016 14:55    Disposition: 06-Home-Health Care Svc    Follow-up Information    MENZ,MICHAEL, MD Follow up in 2 week(s).   Specialty:   Orthopedic Surgery Contact information: [address] [place] [address] [phone]            Signed: Dorise Hiss [facility] 09/23/2016, 9:06 AM

## 2016-09-23 NOTE — Progress Notes (Signed)
Physical Therapy Treatment Patient Details Name: Daniel Luna MRN: [id number] DOB: 03-10-1941 Today's Date: 09/23/2016    History of Present Illness Pt is a 75 y/o M s/p L TKA.  Pt's PMH includes CHF, cyst of brain, macular degeneration, SOB, R TKA.     PT Comments    Pt agreeable to PT; pain controlled in left knee at 3-4/10 the latter being with activity. Progressing range in left knee, but lacking full extension. Educated on stretches for technique, frequency and duration for good carry over to home. Answered questions regarding CP use at home. Pt demonstrates safety with transfers; encouraged good use of Left lower extremity. Pt ambulates well post initial loss of balance due to turning too quickly. Progressing distance and quality; extra practice in the room with turns/obstacle with safe controlled speed and awareness. Pt notes having 3 falls post right knee replacement. ( 2 due to poor safety awareness around obstacles and 1 due to weather) Re inforced safety awareness with ambulation. Pt demonstrates up/down 4 steps with need for refresher on sequence and use of appropriate assistive device depending on step depth. Continue PT to progress strength, safety, balance to improve functional mobility and allow for an optimal, safe return home. Pt plans to discharge home today.   Follow Up Recommendations  Home health PT;Supervision for mobility/OOB     Equipment Recommendations  3in1 (PT)    Recommendations for Other Services       Precautions / Restrictions Precautions Precautions: Fall;Knee Restrictions Weight Bearing Restrictions: Yes LLE Weight Bearing: Weight bearing as tolerated    Mobility  Bed Mobility               General bed mobility comments: Not tested; up in chair  Transfers Overall transfer level: Needs assistance   Transfers: Sit to/from Stand Sit to Stand: Min guard         General transfer comment: Cues for use L knee as equally as  possible  Ambulation/Gait Ambulation/Gait assistance: Min guard;Supervision Ambulation Distance (Feet): 360 Feet (also around room managing turns/obstacles safely) Assistive device: Rolling walker (2 wheeled) Gait Pattern/deviations: Step-through pattern;Decreased step length - right;Decreased stance time - left;Antalgic   Gait velocity interpretation: at or above normal speed for age/gender (initially too fast; encouraged slower controlled gait) General Gait Details: 1 episode of LOB withing first 25 ft due to take a turn to quickly/shraply requiring Min A and pt cross step technique to recover. Encoruaged slower pace; no other events throughout ambulation. Decreased extension on L with heel strike.    Stairs Stairs: Yes Stairs assistance: Min guard Stair Management: Two rails;Forwards;Step to pattern Number of Stairs: 4 General stair comments: Re education on sequence and appropriate AD able to use on steps.   Wheelchair Mobility    Modified Rankin (Stroke Patients Only)       Balance Overall balance assessment: Needs assistance Sitting-balance support: No upper extremity supported;Feet supported Sitting balance-Leahy Scale: Good     Standing balance support: Bilateral upper extremity supported Standing balance-Leahy Scale: Fair                      Cognition Arousal/Alertness: Awake/alert Behavior During Therapy: WFL for tasks assessed/performed Overall Cognitive Status: Within Functional Limits for tasks assessed                      Exercises Total Joint Exercises Ankle Circles/Pumps: AROM;Both;20 reps Quad Sets: Strengthening;Left;20 reps (long sit; with gravity assisted knee extension) Long  Arc QuadVirgina Organ;Left;10 reps;Seated Knee Flexion: AROM;Both;10 reps;Seated (3 positions each repetittion with 10 sec hold each) Goniometric ROM: - 4 to 87 degrees Marching in Standing: AROM;Left;10 reps;Standing Other Exercises Other Exercises: Stand  QS on L 10x    General Comments        Pertinent Vitals/Pain Pain Assessment: 0-10 Pain Score: 3  Pain Location: L knee Pain Descriptors / Indicators: Operative site guarding Pain Intervention(s): Monitored during session;Ice applied    Home Living                      Prior Function            PT Goals (current goals can now be found in the care plan section) Progress towards PT goals: Progressing toward goals    Frequency    BID      PT Plan Current plan remains appropriate    Co-evaluation             End of Session Equipment Utilized During Treatment: Gait belt Activity Tolerance: Patient tolerated treatment well Patient left: in chair;with call bell/phone within reach (refuse alarm; will call for help )     Time: [id number] PT Time Calculation (min) (ACUTE ONLY): 40 min  Charges:  $Gait Training: 23-37 mins $Therapeutic Exercise: 8-22 mins                    G CodesLarae Grooms, PTA 09/23/2016, 10:37 AM

## 2016-09-23 NOTE — Care Management (Signed)
Cost of lovenox verified...cost ($71.56). Patient updated.

## 2016-09-23 NOTE — Discharge Instructions (Signed)

## 2016-09-26 LAB — GLUCOSE, CAPILLARY: Glucose-Capillary: 143 mg/dL — ABNORMAL HIGH (ref 65–99)

## 2016-10-06 ENCOUNTER — Other Ambulatory Visit: Payer: Self-pay | Admitting: Cardiovascular Disease

## 2017-01-18 ENCOUNTER — Ambulatory Visit (INDEPENDENT_AMBULATORY_CARE_PROVIDER_SITE_OTHER): Payer: Medicare Other | Admitting: Cardiovascular Disease

## 2017-01-18 ENCOUNTER — Encounter: Payer: Self-pay | Admitting: Cardiovascular Disease

## 2017-01-18 VITALS — BP 120/88 | HR 84 | Ht 75.0 in | Wt 249.5 lb

## 2017-01-18 DIAGNOSIS — I509 Heart failure, unspecified: Secondary | ICD-10-CM | POA: Diagnosis not present

## 2017-01-18 DIAGNOSIS — E785 Hyperlipidemia, unspecified: Secondary | ICD-10-CM | POA: Diagnosis not present

## 2017-01-18 DIAGNOSIS — I255 Ischemic cardiomyopathy: Secondary | ICD-10-CM | POA: Diagnosis not present

## 2017-01-18 DIAGNOSIS — I2510 Atherosclerotic heart disease of native coronary artery without angina pectoris: Secondary | ICD-10-CM

## 2017-01-18 DIAGNOSIS — Z955 Presence of coronary angioplasty implant and graft: Secondary | ICD-10-CM

## 2017-01-18 DIAGNOSIS — R0602 Shortness of breath: Secondary | ICD-10-CM | POA: Insufficient documentation

## 2017-01-18 NOTE — Patient Instructions (Signed)

## 2017-01-18 NOTE — Progress Notes (Signed)
Cardiology Office Note  Date:  01/18/2017   ID:  Daniel Luna, DOB 1941/05/10, MRN [id number]  PCP:  Idelle Crouch, MD   Chief Complaint  Patient presents with  . other    6 month f/u c/o sob. Meds reviewed verbally with pt.    HPI:  Daniel Luna is a very pleasant 76 year old gentleman with history of smoking who stopped 27 years ago, coronary artery disease with stent 3 placed in 2013 at [facility] to the proximal to mid LAD, mid RCA and distal RCA, hypertension, hyperlipidemia who has cardiomyopathy, ejection fraction 30-35%, who presents For routine follow-up of his coronary artery disease Pet stress test 08/2015: no ischemia, EF 35%, PVCs noted Echocardiogram :ejection fraction 30-35% in 2016  He reports having stress test 1 year ago. Results reviewed October 2016, Low risk study small apical defect, ejection fraction 36% History of total knee replacement on the right  In follow-up he reports that he has continued chronic shortness of breath on exertion perhaps worse this year Now getting short of breath with minimal exertion [place]  Unable to walk secondary to chronic hip pain, does not exercise Weight is up 5 pounds Weight up 25 pounds in 2 years  Lab work reviewed with him Total chol 151, LDL 82  Tolerating simvastatin 80 mg daily, LDL in the 90 range Takes Lasix with potassium daily  EKG on today's visit shows normal sinus rhythm with rate 84 bpm, right bundle branch block, left anterior fascicular block, rare PVCs  Other past medical history reviewed He reports having shortness of breath prior to previous stents in 2013 Stent to 70% mid RCA lesion 3.5 x 15 mm xience, Stent to 80% distal RCA 3.5 x 15 mm xience Stent to mid LAD 80% lesion 3.0 x 23 mm xience    PMH:   has a past medical history of Arthritis; CHF (congestive heart failure) ([place]); Coronary artery disease; Cyst of brain (1983); Diabetes mellitus without complication ([place]);  Dysrhythmia; Heart failure ([place]); Hyperlipidemia; Hypertension; Ischemic cardiomyopathy; Macular degeneration; and Shortness of breath dyspnea.  PSH:    Past Surgical History:  Procedure Laterality Date  . APPENDECTOMY  1997  . CARDIAC CATHETERIZATION    . COLONOSCOPY  2005   4 polyps  . crainectomy    . crainoplasty    . CYST EXCISION  08-06-13   back  . cyst removal brain  1983   benign dermatoid cyst  . EYE SURGERY Bilateral 2008, 2012  . stents  2013   3 placed  . TOTAL KNEE ARTHROPLASTY Right 05/21/2015   Procedure: TOTAL KNEE ARTHROPLASTY;  Surgeon: Dereck Leep, MD;  Location: [facility] ORS;  Service: Orthopedics;  Laterality: Right;  . TOTAL KNEE ARTHROPLASTY Left 09/21/2016   Procedure: TOTAL KNEE ARTHROPLASTY;  Surgeon: Hessie Knows, MD;  Location: [facility] ORS;  Service: Orthopedics;  Laterality: Left;    Current Outpatient Prescriptions  Medication Sig Dispense Refill  . ACCU-CHEK FASTCLIX LANCETS MISC     . ACCU-CHEK SMARTVIEW test strip     . aspirin EC 81 MG tablet Take 81 mg by mouth daily.     . carvedilol (COREG) 12.5 MG tablet TAKE 1 TABLET BY MOUTH TWICE DAILY 180 tablet 3  . diclofenac (VOLTAREN) 75 MG EC tablet Take 75 mg by mouth 2 (two) times daily as needed.    . furosemide (LASIX) 20 MG tablet Take 1 tablet (20 mg total) by mouth daily. 90 tablet 6  . lisinopril (PRINIVIL,ZESTRIL)  40 MG tablet Take 1 tablet (40 mg total) by mouth daily. 90 tablet 3  . metFORMIN (GLUCOPHAGE) 1000 MG tablet Take 1,000 mg by mouth 2 (two) times daily with a meal.    . Multiple Vitamins-Minerals (VISION VITAMINS PO) Take 1 tablet by mouth 2 (two) times daily.    . potassium chloride SA (K-DUR,KLOR-CON) 20 MEQ tablet TAKE ONE TABLET BY MOUTH EVERY DAY 30 tablet 3  . simvastatin (ZOCOR) 80 MG tablet Take 1 tablet (80 mg total) by mouth at bedtime. 90 tablet 3   No current facility-administered medications for this visit.      Allergies:   Iodinated diagnostic agents   Social  History:  The patient  reports that he quit smoking about 28 years ago. His smoking use included Cigarettes. He has a 54.00 pack-year smoking history. He has never used smokeless tobacco. He reports that he drinks alcohol. He reports that he does not use drugs.   Family History:   family history includes Throat cancer in his mother.    Review of Systems: Review of Systems  Constitutional: Negative.        Weight gain  Respiratory: Positive for shortness of breath.   Cardiovascular: Negative.   Gastrointestinal: Negative.   Musculoskeletal: Negative.   Neurological: Negative.   Psychiatric/Behavioral: Negative.   All other systems reviewed and are negative.    PHYSICAL EXAM: VS:  BP 120/88 (BP Location: Left Arm, Patient Position: Sitting, Cuff Size: Large)   Pulse 84   Ht 6\' 3"  (1.905 m)   Wt 249 lb 8 oz (113.2 kg)   BMI 31.19 kg/m  , BMI Body mass index is 31.19 kg/m. GEN: Well nourished, well developed, in no acute distress,   obese  HEENT: normal  Neck: no JVD, carotid bruits, or masses Cardiac: RRR; no murmurs, rubs, or gallops,no edema  Respiratory:  clear to auscultation bilaterally, normal work of breathing GI: soft, nontender, nondistended, + BS MS: no deformity or atrophy  Skin: warm and dry, no rash Neuro:  Strength and sensation are intact Psych: euthymic mood, full affect    Recent Labs: 09/23/2016: BUN 20; Creatinine, Ser 1.09; Hemoglobin 12.3; Platelets 114; Potassium 3.7; Sodium 133    Lipid Panel No results found for: CHOL, HDL, LDLCALC, TRIG    Wt Readings from Last 3 Encounters:  01/18/17 249 lb 8 oz (113.2 kg)  09/21/16 244 lb (110.7 kg)  09/08/16 244 lb (110.7 kg)       ASSESSMENT AND PLAN:  Atherosclerosis of native coronary artery without angina pectoris, unspecified whether native or transplanted heart - Plan: EKG 12-Lead Previous coronary anatomy discussed with him in detail, stenting 3  Congestive heart failure, unspecified  congestive heart failure chronicity, unspecified congestive heart failure type ([place]) - Plan: EKG 12-Lead Clinically does not appear to have heart failure Recommended he monitor for symptoms of leg swelling, worsening abdominal bloating, cough when supine  Hyperlipidemia, unspecified hyperlipidemia type - Plan: EKG 12-Lead Cholesterol mildly above goal, recommended weight loss, stay on simvastatin  Cardiomyopathy, ischemic - Plan: EKG 12-Lead Discussed previous ejection fraction with him 30-35% in 2016 He is not interested in evaluation for ICD. Reports this is why he left the previous cardiologist at [facility], did not feel this was necessary  History of coronary artery stent placement - Plan: EKG 12-Lead  Morbid obesity ([place]) We have encouraged continued exercise, careful diet management in an effort to lose weight. Weight of 25 pounds in the past 2 years  Shortness  of breath Etiology of his symptoms is likely multifactorial including deconditioning, morbid obesity, mild COPD, cardiomyopathy Long discussion concerning his symptoms. We did offer repeat echocardiogram, stress testing He prefers to start an exercise program at the gym for symptoms He will call symptoms do not start to improve   Total encounter time more than 25 minutes  Greater than 50% was spent in counseling and coordination of care with the patient   Disposition:   F/U  6 months   Orders Placed This Encounter  Procedures  . EKG 12-Lead     Signed, Esmond Plants, M.D., Ph.D. 01/18/2017  [place], [place]

## 2017-06-06 ENCOUNTER — Other Ambulatory Visit: Payer: Self-pay | Admitting: Cardiovascular Disease

## 2017-07-24 NOTE — Progress Notes (Signed)
Cardiology Office Note  Date:  07/25/2017   ID:  Marcia, Lepera [birth year], MRN [id number]  PCP:  Idelle Crouch, MD   Chief Complaint  Patient presents with  . other    6 month follow up. Meds reviewed by the pt. verbally. Needs a cardiac clearance for left knee surgery.  Pt. c/o shortness of breath with over exertion.      HPI:  Mr. Thoma is a very pleasant 76 year old gentleman with history of  smoking who stopped >27 years ago,  coronary artery disease with stent 3 placed in 2013 at [facility] to the proximal to mid LAD, mid RCA and distal RCA,  hypertension,  hyperlipidemia who has cardiomyopathy,  ejection fraction 30-35%,  Pet stress test 08/2015: no ischemia, EF 35%, PVCs noted Echocardiogram :ejection fraction 30-35% in 2016 who presents For routine follow-up of his coronary artery disease  In follow-up today he reports that he needs a redo knee replacement on the left Scheduled for surgery with Dr. Rudene Christians in September 2018 At baseline, Does bike for exercise and weights at gym, [employer] Unable to treadmill secondary to chronic knee pain Denies any anginal symptoms,  He does have some chronic mild stable shortness of breath on heavy exertion  Getting over a cold/URI. 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   stress test October 2016, Low risk study small apical defect, ejection fraction 36%  Lab work reviewed with him Total chol 151, LDL 82 from 2017  EKG on today's visit shows normal sinus rhythm with rate 79 bpm, right bundle branch block, left anterior fascicular block, rare PVCs  Other past medical history reviewed He reports having shortness of breath prior to previous stents in 2013 Stent to 70% mid RCA lesion 3.5 x 15 mm xience, Stent to 80% distal RCA 3.5 x 15 mm xience Stent to mid LAD 80% lesion 3.0 x 23 mm xience    PMH:   has a past medical history of Arthritis; CHF (congestive heart failure) ([place]); Coronary artery disease; Cyst of brain  (1983); Diabetes mellitus without complication ([place]); Dysrhythmia; Heart failure ([place]); Hyperlipidemia; Hypertension; Ischemic cardiomyopathy; Macular degeneration; and Shortness of breath dyspnea.  PSH:    Past Surgical History:  Procedure Laterality Date  . APPENDECTOMY  1997  . CARDIAC CATHETERIZATION    . COLONOSCOPY  2005   4 polyps  . crainectomy    . crainoplasty    . CYST EXCISION  08-06-13   back  . cyst removal brain  1983   benign dermatoid cyst  . EYE SURGERY Bilateral 2008, 2012  . stents  2013   3 placed  . TOTAL KNEE ARTHROPLASTY Right 05/21/2015   Procedure: TOTAL KNEE ARTHROPLASTY;  Surgeon: Dereck Leep, MD;  Location: [facility] ORS;  Service: Orthopedics;  Laterality: Right;  . TOTAL KNEE ARTHROPLASTY Left 09/21/2016   Procedure: TOTAL KNEE ARTHROPLASTY;  Surgeon: Hessie Knows, MD;  Location: [facility] ORS;  Service: Orthopedics;  Laterality: Left;    Current Outpatient Prescriptions  Medication Sig Dispense Refill  . ACCU-CHEK FASTCLIX LANCETS MISC     . ACCU-CHEK SMARTVIEW test strip     . aspirin EC 81 MG tablet Take 81 mg by mouth daily.     . carvedilol (COREG) 12.5 MG tablet TAKE 1 TABLET BY MOUTH TWICE DAILY 180 tablet 3  . furosemide (LASIX) 20 MG tablet Take 1 tablet (20 mg total) by mouth daily. 90 tablet 6  . lisinopril (PRINIVIL,ZESTRIL) 40 MG tablet Take  1 tablet (40 mg total) by mouth daily. 90 tablet 3  . metFORMIN (GLUCOPHAGE) 1000 MG tablet Take 1,000 mg by mouth 2 (two) times daily with a meal.    . Multiple Vitamins-Minerals (VISION VITAMINS PO) Take 1 tablet by mouth 2 (two) times daily.    . potassium chloride SA (K-DUR,KLOR-CON) 20 MEQ tablet TAKE ONE TABLET BY MOUTH EVERY DAY 30 tablet 3  . simvastatin (ZOCOR) 80 MG tablet Take 1 tablet (80 mg total) by mouth at bedtime. 90 tablet 3   No current facility-administered medications for this visit.      Allergies:   Iodinated diagnostic agents   Social History:  The patient  reports that he quit  smoking about 28 years ago. His smoking use included Cigarettes. He has a 54.00 pack-year smoking history. He has never used smokeless tobacco. He reports that he drinks alcohol. He reports that he does not use drugs.   Family History:   family history includes Throat cancer in his mother.    Review of Systems: Review of Systems  Constitutional: Negative.        Weight gain  Respiratory: Positive for shortness of breath.   Cardiovascular: Negative.   Gastrointestinal: Negative.   Musculoskeletal: Positive for joint pain.  Neurological: Negative.   Psychiatric/Behavioral: Negative.   All other systems reviewed and are negative.    PHYSICAL EXAM: VS:  BP 118/78 (BP Location: Left Arm, Patient Position: Sitting, Cuff Size: Normal)   Pulse 78   Ht 6\' 3"  (1.905 m)   Wt 241 lb 8 oz (109.5 kg)   BMI 30.19 kg/m  , BMI Body mass index is 30.19 kg/m. GEN: Well nourished, well developed, in no acute distress,   obese  HEENT: normal  Neck: no JVD, carotid bruits, or masses Cardiac: RRR; no murmurs, rubs, or gallops,no edema  Respiratory:  clear to auscultation bilaterally, normal work of breathing GI: soft, nontender, nondistended, + BS MS: no deformity or atrophy  Skin: warm and dry, no rash Neuro:  Strength and sensation are intact Psych: euthymic mood, full affect    Recent Labs: 09/23/2016: BUN 20; Creatinine, Ser 1.09; Hemoglobin 12.3; Platelets 114; Potassium 3.7; Sodium 133    Lipid Panel No results found for: CHOL, HDL, LDLCALC, TRIG    Wt Readings from Last 3 Encounters:  07/25/17 241 lb 8 oz (109.5 kg)  01/18/17 249 lb 8 oz (113.2 kg)  09/21/16 244 lb (110.7 kg)       ASSESSMENT AND PLAN:  Preoperative cardiovascular Acceptable risk for redo total knee replacement No further testing needed  Atherosclerosis of native coronary artery without angina pectoris, unspecified whether native or transplanted heart - Plan: EKG 12-Lead Currently with no symptoms of  angina. No further workup at this time. Continue current medication regimen.  Congestive heart failure, unspecified congestive heart failure chronicity, unspecified congestive heart failure type ([place]) - Plan: EKG 12-Lead Appears euvolemic on today's visit, Denies leg swelling, worsening abdominal bloating, cough when supine Long discussion concerning changing his lisinopril to entresto. Discussed the benefits. He will read about it, call us back after he checks the price.   Hyperlipidemia, unspecified hyperlipidemia type - Plan: EKG 12-Lead Cholesterol mildly above goal, recommended weight loss, stay on simvastatin  Cardiomyopathy, ischemic - Plan: EKG 12-Lead Discussed previous ejection fraction with him 30-35% in 2016 He is not interested in evaluation for ICD. Reports this is why he left the previous cardiologist at [facility], did not feel this was necessary Currently not interested  in changing to entresto. Prefers to check the price  Morbid obesity ([place]) We have encouraged continued exercise, careful diet management in an effort to lose weight.  Shortness of breath Multifactorial including deconditioning, morbid obesity, mild COPD, cardiomyopathy Recommended he continue exercise program   Total encounter time more than 25 minutes  Greater than 50% was spent in counseling and coordination of care with the patient   Disposition:   F/U  12 months   Orders Placed This Encounter  Procedures  . EKG 12-Lead     Signed, Esmond Plants, M.D., Ph.D. 07/25/2017  [place], [place]

## 2017-07-25 ENCOUNTER — Encounter: Payer: Self-pay | Admitting: Cardiovascular Disease

## 2017-07-25 ENCOUNTER — Ambulatory Visit (INDEPENDENT_AMBULATORY_CARE_PROVIDER_SITE_OTHER): Payer: Medicare Other | Admitting: Cardiovascular Disease

## 2017-07-25 VITALS — BP 118/78 | HR 78 | Ht 75.0 in | Wt 241.5 lb

## 2017-07-25 DIAGNOSIS — I255 Ischemic cardiomyopathy: Secondary | ICD-10-CM | POA: Diagnosis not present

## 2017-07-25 DIAGNOSIS — E782 Mixed hyperlipidemia: Secondary | ICD-10-CM | POA: Diagnosis not present

## 2017-07-25 DIAGNOSIS — I25118 Atherosclerotic heart disease of native coronary artery with other forms of angina pectoris: Secondary | ICD-10-CM | POA: Diagnosis not present

## 2017-07-25 DIAGNOSIS — R0602 Shortness of breath: Secondary | ICD-10-CM

## 2017-07-25 NOTE — Patient Instructions (Addendum)
Medication Instructions:   No medication changes made  Please check the price of ENTRESTO twice a day   Labwork:  No new labs needed  Testing/Procedures:  No further testing at this time   Follow-Up: It was a pleasure seeing you in the office today. Please call us if you have new issues that need to be addressed before your next appt.  [phone]  Your physician wants you to follow-up in: 12 months.  You will receive a reminder letter in the mail two months in advance. If you don't receive a letter, please call our office to schedule the follow-up appointment.  If you need a refill on your cardiac medications before your next appointment, please call your pharmacy.

## 2017-08-02 ENCOUNTER — Other Ambulatory Visit: Payer: Self-pay | Admitting: Cardiovascular Disease

## 2017-08-26 ENCOUNTER — Inpatient Hospital Stay: Admission: RE | Admit: 2017-08-26 | Payer: Medicare Other | Source: Ambulatory Visit

## 2017-08-26 ENCOUNTER — Encounter
Admission: RE | Admit: 2017-08-26 | Discharge: 2017-08-26 | Disposition: A | Discharge status: Home / Self Care | Payer: Medicare Other | Source: Ambulatory Visit | Attending: Orthopedic Surgery | Admitting: Orthopedic Surgery

## 2017-08-26 ENCOUNTER — Telehealth: Payer: Self-pay | Admitting: Cardiovascular Disease

## 2017-08-26 DIAGNOSIS — Z01812 Encounter for preprocedural laboratory examination: Secondary | ICD-10-CM | POA: Insufficient documentation

## 2017-08-26 LAB — BASIC METABOLIC PANEL
ANION GAP: 9 (ref 5–15)
BUN: 23 mg/dL — ABNORMAL HIGH (ref 6–20)
CALCIUM: 10 mg/dL (ref 8.9–10.3)
CO2: 28 mmol/L (ref 22–32)
CREATININE: 1.32 mg/dL — AB (ref 0.61–1.24)
Chloride: 100 mmol/L — ABNORMAL LOW (ref 101–111)
GFR, EST AFRICAN AMERICAN: 60 mL/min — AB (ref >60)
GFR, EST NON AFRICAN AMERICAN: 51 mL/min — AB (ref >60)
Glucose, Bld: 79 mg/dL (ref 65–99)
Potassium: 4.5 mmol/L (ref 3.5–5.1)
Sodium: 137 mmol/L (ref 135–145)

## 2017-08-26 LAB — TYPE AND SCREEN
ABO/RH(D): A POS
ANTIBODY SCREEN: NEGATIVE

## 2017-08-26 LAB — URINALYSIS, COMPLETE (UACMP) WITH MICROSCOPIC
BILIRUBIN URINE: NEGATIVE
Bacteria, UA: NONE SEEN
GLUCOSE, UA: NEGATIVE mg/dL
HGB URINE DIPSTICK: NEGATIVE
Ketones, ur: NEGATIVE mg/dL
Leukocytes, UA: NEGATIVE
Nitrite: NEGATIVE
PROTEIN: NEGATIVE mg/dL
RBC / HPF: NONE SEEN RBC/hpf (ref 0–5)
Specific Gravity, Urine: 1.004 — ABNORMAL LOW (ref 1.005–1.030)
Squamous Epithelial / LPF: NONE SEEN
WBC UA: NONE SEEN WBC/hpf (ref 0–5)
pH: 5 (ref 5.0–8.0)

## 2017-08-26 LAB — CBC
HEMATOCRIT: 40.9 % (ref 40.0–52.0)
HEMOGLOBIN: 14.2 g/dL (ref 13.0–18.0)
MCH: 32.7 pg (ref 26.0–34.0)
MCHC: 34.8 g/dL (ref 32.0–36.0)
MCV: 94.2 fL (ref 80.0–100.0)
Platelets: 178 10*3/uL (ref 150–440)
RBC: 4.34 MIL/uL — AB (ref 4.40–5.90)
RDW: 13.2 % (ref 11.5–14.5)
WBC: 7.3 10*3/uL (ref 3.8–10.6)

## 2017-08-26 LAB — SURGICAL PCR SCREEN
MRSA, PCR: NEGATIVE
STAPHYLOCOCCUS AUREUS: NEGATIVE

## 2017-08-26 LAB — PROTIME-INR
INR: 0.99
PROTHROMBIN TIME: 13 s (ref 11.4–15.2)

## 2017-08-26 LAB — APTT: aPTT: 33 seconds (ref 24–36)

## 2017-08-26 LAB — SEDIMENTATION RATE: SED RATE: 30 mm/h — AB (ref 0–20)

## 2017-08-26 NOTE — Telephone Encounter (Signed)
Reviewed w/Dr. Rockey Situ who advised to continue 81mg  aspirin. Informed pt who verbalized understanding and is appreciative of the call.

## 2017-08-26 NOTE — Patient Instructions (Signed)
Your procedure is scheduled on: September 06, 2017 Report to Same Day Surgery on the 2nd floor in the [place]. To find out your arrival time, please call [phone] between 1PM - 3PM on: Monday September 05, 2017  REMEMBER: Instructions that are not followed completely may result in serious medical risk up to and including death; or upon the discretion of your surgeon and anesthesiologist your surgery may need to be rescheduled.  Do not eat food or drink liquids after midnight. No gum chewing or hard candies.  You may however, drink CLEAR liquids up to 2 hours before you are scheduled to arrive at the hospital for your procedure.  Do not drink clear liquids within 2 hours of your scheduled arrival to the hospital as this may lead to your procedure being delayed or rescheduled.  Clear liquids include: - water  DO NOT drink anything not on this list.  Type 1 and Type 2 diabetics should only drink water.  No Alcohol for 24 hours before or after surgery.  No Smoking for 24 hours prior to surgery.  Notify your doctor if there is any change in your medical condition (cold, fever, infection).  Do not wear jewelry, make-up, hairpins, clips or nail polish.  Do not wear lotions, powders, or perfumes.   Do not shave 48 hours prior to surgery. Men may shave face and neck.  Contacts and dentures may not be worn into surgery.  Do not bring valuables to the hospital. [facility] is not responsible for any belongings or valuables.   TAKE THESE MEDICATIONS THE MORNING OF SURGERY WITH A SIP OF WATER: CARVEDILOL    Use CHG Soap or wipes as directed on instruction sheet.  Stop Metformin and Janumet 2 days prior to surgery. LAST DOSE Saturday 22, 2108  Follow recommendations from Cardiologist, Pulmonologist or PCP regarding stopping Aspirin.  Stop Anti-inflammatories such as Advil, Aleve, Ibuprofen, Motrin, Naproxen, Naprosyn, Goodie powder, or aspirin products. (May take Tylenol or  Acetaminophen and Celebrex if needed.)  Stop supplements until after surgery. (May continue Vitamin D, Vitamin B, and multivitamin.)  If you are being admitted to the hospital overnight, leave your suitcase in the car. After surgery it may be brought to your room.  If you are being discharged the day of surgery, you will not be allowed to drive home. You will need someone to drive you home and stay with you that night.   If you are taking public transportation, you will need to have a responsible adult to with you.  Please call the number above if you have any questions about these instructions.

## 2017-08-26 NOTE — Telephone Encounter (Signed)
Pt states he is having knee surgery on 9/25 and would like to know when he should stop his aspirin. Please call and advise.

## 2017-08-28 LAB — URINE CULTURE: Culture: NO GROWTH

## 2017-09-06 ENCOUNTER — Inpatient Hospital Stay: Payer: Medicare Other | Admitting: Anesthesiology

## 2017-09-06 ENCOUNTER — Ambulatory Visit
Admission: RE | Admit: 2017-09-06 | Discharge: 2017-09-07 | Disposition: A | Discharge status: Home / Self Care | Payer: Medicare Other | Source: Ambulatory Visit | Attending: Orthopedic Surgery | Admitting: Orthopedic Surgery

## 2017-09-06 ENCOUNTER — Encounter
Admission: RE | Disposition: A | Discharge status: Home / Self Care | Payer: Self-pay | Source: Ambulatory Visit | Attending: Orthopedic Surgery

## 2017-09-06 DIAGNOSIS — I509 Heart failure, unspecified: Secondary | ICD-10-CM | POA: Insufficient documentation

## 2017-09-06 DIAGNOSIS — Y9289 Other specified places as the place of occurrence of the external cause: Secondary | ICD-10-CM | POA: Insufficient documentation

## 2017-09-06 DIAGNOSIS — Z96659 Presence of unspecified artificial knee joint: Secondary | ICD-10-CM

## 2017-09-06 DIAGNOSIS — I255 Ischemic cardiomyopathy: Secondary | ICD-10-CM | POA: Insufficient documentation

## 2017-09-06 DIAGNOSIS — T84028A Dislocation of other internal joint prosthesis, initial encounter: Secondary | ICD-10-CM

## 2017-09-06 DIAGNOSIS — T84023A Instability of internal left knee prosthesis, initial encounter: Secondary | ICD-10-CM | POA: Diagnosis present

## 2017-09-06 DIAGNOSIS — Z955 Presence of coronary angioplasty implant and graft: Secondary | ICD-10-CM | POA: Diagnosis not present

## 2017-09-06 DIAGNOSIS — Z87891 Personal history of nicotine dependence: Secondary | ICD-10-CM | POA: Diagnosis not present

## 2017-09-06 DIAGNOSIS — Y838 Other surgical procedures as the cause of abnormal reaction of the patient, or of later complication, without mention of misadventure at the time of the procedure: Secondary | ICD-10-CM | POA: Diagnosis not present

## 2017-09-06 DIAGNOSIS — I2510 Atherosclerotic heart disease of native coronary artery without angina pectoris: Secondary | ICD-10-CM | POA: Insufficient documentation

## 2017-09-06 DIAGNOSIS — E785 Hyperlipidemia, unspecified: Secondary | ICD-10-CM | POA: Insufficient documentation

## 2017-09-06 HISTORY — PX: TOTAL KNEE REVISION: SHX996

## 2017-09-06 LAB — GLUCOSE, CAPILLARY
GLUCOSE-CAPILLARY: 106 mg/dL — AB (ref 65–99)
GLUCOSE-CAPILLARY: 202 mg/dL — AB (ref 65–99)
Glucose-Capillary: 186 mg/dL — ABNORMAL HIGH (ref 65–99)
Glucose-Capillary: 170 mg/dL — ABNORMAL HIGH (ref 65–99)
Glucose-Capillary: 130 mg/dL — ABNORMAL HIGH (ref 65–99)

## 2017-09-06 SURGERY — TOTAL KNEE REVISION
Anesthesia: Spinal | Site: Knee | Laterality: Left | Wound class: Clean

## 2017-09-06 MED ORDER — ONDANSETRON HCL 4 MG/2ML IJ SOLN
INTRAMUSCULAR | Status: AC
  Filled 2017-09-06: qty 2

## 2017-09-06 MED ORDER — BISACODYL 10 MG RE SUPP: 10.0000 mg | Freq: Every day | RECTAL | Status: DC | PRN

## 2017-09-06 MED ORDER — PHENYLEPHRINE HCL 10 MG/ML IJ SOLN
INTRAMUSCULAR | Status: DC | PRN
  Administered 2017-09-06: 10 ug/min via INTRAVENOUS

## 2017-09-06 MED ORDER — CEFAZOLIN SODIUM-DEXTROSE 2-4 GM/100ML-% IV SOLN
2.0000 g | Freq: Four times a day (QID) | INTRAVENOUS | Status: DC
  Administered 2017-09-06: 2 g via INTRAVENOUS
  Filled 2017-09-06 (×3): qty 100

## 2017-09-06 MED ORDER — INSULIN ASPART 100 UNIT/ML ~~LOC~~ SOLN
0.0000 [IU] | Freq: Three times a day (TID) | SUBCUTANEOUS | Status: DC
  Administered 2017-09-06 – 2017-09-07 (×2): 2 [IU] via SUBCUTANEOUS
  Administered 2017-09-07: 3 [IU] via SUBCUTANEOUS
  Filled 2017-09-06 (×3): qty 1

## 2017-09-06 MED ORDER — CEFAZOLIN SODIUM-DEXTROSE 2-4 GM/100ML-% IV SOLN
2.0000 g | Freq: Once | INTRAVENOUS | Status: AC
  Administered 2017-09-06: 2 g via INTRAVENOUS

## 2017-09-06 MED ORDER — MORPHINE SULFATE (PF) 2 MG/ML IV SOLN
2.0000 mg | INTRAVENOUS | Status: DC | PRN
  Administered 2017-09-06: 2 mg via INTRAVENOUS
  Filled 2017-09-06: qty 1

## 2017-09-06 MED ORDER — MAGNESIUM HYDROXIDE 400 MG/5ML PO SUSP: 30.0000 mL | Freq: Every day | ORAL | Status: DC | PRN

## 2017-09-06 MED ORDER — BUPIVACAINE HCL (PF) 0.5 % IJ SOLN
INTRAMUSCULAR | Status: DC | PRN
  Administered 2017-09-06: 3.2 mL

## 2017-09-06 MED ORDER — ONDANSETRON HCL 4 MG/2ML IJ SOLN
INTRAMUSCULAR | Status: DC | PRN
  Administered 2017-09-06: 4 mg via INTRAVENOUS

## 2017-09-06 MED ORDER — MORPHINE SULFATE (PF) 10 MG/ML IV SOLN
INTRAVENOUS | Status: AC
  Filled 2017-09-06: qty 1

## 2017-09-06 MED ORDER — FAMOTIDINE 20 MG PO TABS
ORAL_TABLET | ORAL | Status: AC
  Administered 2017-09-06: 20 mg via ORAL
  Filled 2017-09-06: qty 1

## 2017-09-06 MED ORDER — HYDROCODONE-ACETAMINOPHEN 5-325 MG PO TABS
1.0000 | ORAL_TABLET | ORAL | Status: DC | PRN
  Administered 2017-09-06: 2 via ORAL
  Administered 2017-09-06: 1 via ORAL
  Administered 2017-09-06 – 2017-09-07 (×2): 2 via ORAL
  Filled 2017-09-06: qty 2
  Filled 2017-09-06: qty 1
  Filled 2017-09-06 (×2): qty 2

## 2017-09-06 MED ORDER — NEOMYCIN-POLYMYXIN B GU 40-200000 IR SOLN
Status: AC
  Filled 2017-09-06: qty 20

## 2017-09-06 MED ORDER — SODIUM CHLORIDE 0.9 % IJ SOLN
INTRAMUSCULAR | Status: AC
  Filled 2017-09-06: qty 50

## 2017-09-06 MED ORDER — DOCUSATE SODIUM 100 MG PO CAPS
100.0000 mg | ORAL_CAPSULE | Freq: Two times a day (BID) | ORAL | Status: DC
  Administered 2017-09-06 – 2017-09-07 (×3): 100 mg via ORAL
  Filled 2017-09-06 (×3): qty 1

## 2017-09-06 MED ORDER — FENTANYL CITRATE (PF) 100 MCG/2ML IJ SOLN: 25.0000 ug | INTRAMUSCULAR | Status: DC | PRN

## 2017-09-06 MED ORDER — SODIUM CHLORIDE 0.9 % IV SOLN
INTRAVENOUS | Status: DC | PRN
  Administered 2017-09-06: 60 mL

## 2017-09-06 MED ORDER — MIDAZOLAM HCL 5 MG/5ML IJ SOLN
INTRAMUSCULAR | Status: DC | PRN
  Administered 2017-09-06: 2 mg via INTRAVENOUS

## 2017-09-06 MED ORDER — MAGNESIUM CITRATE PO SOLN: 1.0000 | Freq: Once | ORAL | Status: DC | PRN

## 2017-09-06 MED ORDER — ONDANSETRON HCL 4 MG/2ML IJ SOLN: 4.0000 mg | Freq: Once | INTRAMUSCULAR | Status: DC | PRN

## 2017-09-06 MED ORDER — ONDANSETRON HCL 4 MG/2ML IJ SOLN: 4.0000 mg | Freq: Four times a day (QID) | INTRAMUSCULAR | Status: DC | PRN

## 2017-09-06 MED ORDER — ONDANSETRON HCL 4 MG PO TABS: 4.0000 mg | ORAL_TABLET | Freq: Four times a day (QID) | ORAL | Status: DC | PRN

## 2017-09-06 MED ORDER — MIDAZOLAM HCL 2 MG/2ML IJ SOLN
INTRAMUSCULAR | Status: AC
  Filled 2017-09-06: qty 2

## 2017-09-06 MED ORDER — BUPIVACAINE LIPOSOME 1.3 % IJ SUSP
INTRAMUSCULAR | Status: AC
  Filled 2017-09-06: qty 20

## 2017-09-06 MED ORDER — PROPOFOL 10 MG/ML IV BOLUS
INTRAVENOUS | Status: DC | PRN
  Administered 2017-09-06: 40 mg via INTRAVENOUS

## 2017-09-06 MED ORDER — SODIUM CHLORIDE 0.9 % IV SOLN
INTRAVENOUS | Status: DC
  Administered 2017-09-06: 08:00:00 via INTRAVENOUS
  Administered 2017-09-06: 1000 mL via INTRAVENOUS
  Administered 2017-09-06: 07:00:00 via INTRAVENOUS

## 2017-09-06 MED ORDER — METOCLOPRAMIDE HCL 10 MG PO TABS: 5.0000 mg | ORAL_TABLET | Freq: Three times a day (TID) | ORAL | Status: DC | PRN

## 2017-09-06 MED ORDER — BUPIVACAINE-EPINEPHRINE (PF) 0.25% -1:200000 IJ SOLN
INTRAMUSCULAR | Status: AC
  Filled 2017-09-06: qty 30

## 2017-09-06 MED ORDER — METOCLOPRAMIDE HCL 5 MG/ML IJ SOLN: 5.0000 mg | Freq: Three times a day (TID) | INTRAMUSCULAR | Status: DC | PRN

## 2017-09-06 MED ORDER — NEOMYCIN-POLYMYXIN B GU 40-200000 IR SOLN
Status: DC | PRN
Start: 2017-09-06 — End: 2017-09-06
  Administered 2017-09-06: 14 mL

## 2017-09-06 MED ORDER — DEXTROSE 5 % IV SOLN
Freq: Four times a day (QID) | INTRAVENOUS | Status: AC
  Administered 2017-09-06 – 2017-09-07 (×3): via INTRAVENOUS
  Filled 2017-09-06 (×4): qty 2000

## 2017-09-06 MED ORDER — PROPOFOL 500 MG/50ML IV EMUL
INTRAVENOUS | Status: DC | PRN
  Administered 2017-09-06: 100 ug/kg/min via INTRAVENOUS

## 2017-09-06 MED ORDER — PROPOFOL 500 MG/50ML IV EMUL
INTRAVENOUS | Status: AC
  Filled 2017-09-06: qty 50

## 2017-09-06 MED ORDER — CEFAZOLIN SODIUM-DEXTROSE 2-4 GM/100ML-% IV SOLN
INTRAVENOUS | Status: AC
  Filled 2017-09-06: qty 100

## 2017-09-06 MED ORDER — SODIUM CHLORIDE 0.9 % IV SOLN
INTRAVENOUS | Status: DC
  Administered 2017-09-06: 11:00:00 via INTRAVENOUS

## 2017-09-06 MED ORDER — FAMOTIDINE 20 MG PO TABS
20.0000 mg | ORAL_TABLET | Freq: Once | ORAL | Status: AC
  Administered 2017-09-06: 20 mg via ORAL

## 2017-09-06 SURGICAL SUPPLY — 57 items
BANDAGE ACE 6X5 VEL STRL LF (GAUZE/BANDAGES/DRESSINGS) ×3 IMPLANT
BLADE SAW 1 (BLADE) ×3 IMPLANT
BLADE SAW 1/2 (BLADE) ×3 IMPLANT
CANISTER SUCT 1200ML W/VALVE (MISCELLANEOUS) ×3 IMPLANT
CANISTER SUCT 3000ML PPV (MISCELLANEOUS) ×6 IMPLANT
CATH FOL LEG HOLDER (MISCELLANEOUS) ×3 IMPLANT
CATH TRAY METER 16FR LF (MISCELLANEOUS) ×3 IMPLANT
CHLORAPREP W/TINT 26ML (MISCELLANEOUS) ×6 IMPLANT
COOLER POLAR GLACIER W/PUMP (MISCELLANEOUS) ×3 IMPLANT
COVER TABLE BACK 60X90 (DRAPES) ×3 IMPLANT
CUFF TOURN 24 STER (MISCELLANEOUS) IMPLANT
CUFF TOURN 30 STER DUAL PORT (MISCELLANEOUS) IMPLANT
DRAPE SHEET LG 3/4 BI-LAMINATE (DRAPES) ×12 IMPLANT
ELECT CAUTERY BLADE 6.4 (BLADE) ×3 IMPLANT
ELECT REM PT RETURN 9FT ADLT (ELECTROSURGICAL) ×3
ELECTRODE REM PT RTRN 9FT ADLT (ELECTROSURGICAL) ×1 IMPLANT
GAUZE PETRO XEROFOAM 1X8 (MISCELLANEOUS) ×3 IMPLANT
GAUZE SPONGE 4X4 12PLY STRL (GAUZE/BANDAGES/DRESSINGS) ×3 IMPLANT
GLOVE BIOGEL PI IND STRL 9 (GLOVE) ×1 IMPLANT
GLOVE BIOGEL PI INDICATOR 9 (GLOVE) ×2
GLOVE INDICATOR 8.0 STRL GRN (GLOVE) ×3 IMPLANT
GLOVE SURG ORTHO 8.0 STRL STRW (GLOVE) ×3 IMPLANT
GLOVE SURG SYN 9.0  PF PI (GLOVE) ×2
GLOVE SURG SYN 9.0 PF PI (GLOVE) ×1 IMPLANT
GOWN SRG 2XL LVL 4 RGLN SLV (GOWNS) ×1 IMPLANT
GOWN STRL NON-REIN 2XL LVL4 (GOWNS) ×2
GOWN STRL REUS W/ TWL LRG LVL3 (GOWN DISPOSABLE) ×1 IMPLANT
GOWN STRL REUS W/ TWL XL LVL3 (GOWN DISPOSABLE) ×1 IMPLANT
GOWN STRL REUS W/TWL LRG LVL3 (GOWN DISPOSABLE) ×2
GOWN STRL REUS W/TWL XL LVL3 (GOWN DISPOSABLE) ×2
HOOD PEEL AWAY FLYTE STAYCOOL (MISCELLANEOUS) ×6 IMPLANT
IMMBOLIZER KNEE 19 BLUE UNIV (SOFTGOODS) ×3 IMPLANT
INSERT TIBIAL FIXED SZ5 LT 12M (Insert) ×3 IMPLANT
KIT RM TURNOVER STRD PROC AR (KITS) ×3 IMPLANT
KNIFE SCULPS 14X20 (INSTRUMENTS) ×3 IMPLANT
NEEDLE SPNL 18GX3.5 QUINCKE PK (NEEDLE) ×3 IMPLANT
NEEDLE SPNL 20GX3.5 QUINCKE YW (NEEDLE) ×3 IMPLANT
NS IRRIG 1000ML POUR BTL (IV SOLUTION) ×3 IMPLANT
PACK TOTAL KNEE (MISCELLANEOUS) ×3 IMPLANT
PAD WRAPON POLAR KNEE (MISCELLANEOUS) ×1 IMPLANT
PULSAVAC PLUS IRRIG FAN TIP (DISPOSABLE) ×3
SOL .9 NS 3000ML IRR  AL (IV SOLUTION) ×2
SOL .9 NS 3000ML IRR UROMATIC (IV SOLUTION) ×1 IMPLANT
STAPLER SKIN PROX 35W (STAPLE) ×3 IMPLANT
SUCTION FRAZIER HANDLE 10FR (MISCELLANEOUS) ×2
SUCTION TUBE FRAZIER 10FR DISP (MISCELLANEOUS) ×1 IMPLANT
SUT DVC 2 QUILL PDO  T11 36X36 (SUTURE) ×2
SUT DVC 2 QUILL PDO T11 36X36 (SUTURE) ×1 IMPLANT
SUT DVC QUILL MONODERM 30X30 (SUTURE) ×3 IMPLANT
SUT TICRON 2-0 30IN 311381 (SUTURE) IMPLANT
SWAB CULTURE AMIES ANAERIB BLU (MISCELLANEOUS) IMPLANT
SYR 20CC LL (SYRINGE) ×3 IMPLANT
SYR 50ML LL SCALE MARK (SYRINGE) ×6 IMPLANT
TIP FAN IRRIG PULSAVAC PLUS (DISPOSABLE) ×1 IMPLANT
TOWEL OR 17X26 4PK STRL BLUE (TOWEL DISPOSABLE) ×3 IMPLANT
TOWER CARTRIDGE SMART MIX (DISPOSABLE) ×3 IMPLANT
WRAPON POLAR PAD KNEE (MISCELLANEOUS) ×3

## 2017-09-06 NOTE — Anesthesia Procedure Notes (Addendum)
Spinal  Patient location during procedure: OR Start time: 09/06/2017 7:20 AM End time: 09/06/2017 7:25 AM Staffing Resident/CRNA: Nelda Marseille Performed: resident/CRNA  Preanesthetic Checklist Completed: patient identified, site marked, surgical consent, pre-op evaluation, timeout performed, IV checked, risks and benefits discussed and monitors and equipment checked Spinal Block Patient position: sitting Prep: Betadine Patient monitoring: heart rate, continuous pulse ox, blood pressure and cardiac monitor Approach: right paramedian Location: L3-4 Injection technique: single-shot Needle Needle type: [place]acre and Introducer  Needle gauge: 25 G Needle length: 9 cm Assessment Sensory level: T10 Additional Notes Negative paresthesia. Negative blood return. Positive free-flowing CSF. Expiration date of kit checked and confirmed. Patient tolerated procedure well, without complications.

## 2017-09-06 NOTE — Anesthesia Procedure Notes (Signed)
Date/Time: 09/06/2017 7:56 AM Performed by: Nelda Marseille Pre-anesthesia Checklist: Patient identified, Emergency Drugs available, Suction available, Patient being monitored and Timeout performed Oxygen Delivery Method: Simple face mask

## 2017-09-06 NOTE — Transfer of Care (Signed)
Immediate Anesthesia Transfer of Care Note  Patient: GRAVES NIPP  Procedure(s) Performed: Procedure(s): LEFT KNEE REVISION WITH POLY EXCHANGE, OPEN SYNOVECTOMY (Left)  Patient Location: PACU  Anesthesia Type:Spinal  Level of Consciousness: sedated  Airway & Oxygen Therapy: Patient Spontanous Breathing and Patient connected to face mask oxygen  Post-op Assessment: Report given to RN and Post -op Vital signs reviewed and stable  Post vital signs: Reviewed and stable  Last Vitals:  Vitals:   09/06/17 0611 09/06/17 0614  BP: (!) 174/86   Pulse: 72 (!) 44  Resp: 14   Temp: 36.4 C   SpO2: 100%     Last Pain:  Vitals:   09/06/17 0611  TempSrc: Tympanic  PainSc: 0-No pain         Complications: No apparent anesthesia complications

## 2017-09-06 NOTE — H&P (Signed)
Reviewed paper H+P, will be scanned into chart. No changes noted.  

## 2017-09-06 NOTE — OR Nursing (Signed)
Explanted tibial insert

## 2017-09-06 NOTE — NC FL2 (Signed)
[place] LEVEL OF CARE SCREENING TOOL     IDENTIFICATION  Patient Name: Daniel Luna Birthdate: 1941-01-12 Sex: male Admission Date (Current Location): 09/06/2017  [place] and [place] Number:  Engineering geologist and Address:  [facility], [address], [place], [place] [address]      Provider Number: [id number]  Attending Physician Name and Address:  Hessie Knows, MD  Relative Name and Phone Number:       Current Level of Care: Hospital Recommended Level of Care: [place] Prior Approval Number:    Date Approved/Denied:   PASRR Number:  [id number] )  Discharge Plan: SNF    Current Diagnoses: Patient Active Problem List   Diagnosis Date Noted  . 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 Morbid obesity ([place]) 01/18/2017  . Shortness of breath 01/18/2017  . Primary localized osteoarthritis of left knee 09/21/2016  . Coronary artery disease of native artery of native heart with stable angina pectoris ([place]) 07/12/2016  . Congestive heart failure ([place]) 07/12/2016  . History of coronary artery stent placement 07/12/2016  . Hyperlipidemia 07/12/2016  . Cardiomyopathy, ischemic 07/12/2016  . 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  . Total knee replacement status 05/21/2015  . Encounter for screening colonoscopy 12/25/2013  . Sebaceous cyst 08/15/2013  . Infected sebaceous cyst of skin 05/23/2013    Orientation RESPIRATION BLADDER Height & Weight     Self, Time, Situation, Place  Normal Continent Weight: 241 lb (109.3 kg) Height:  6\' 3"  (190.5 cm)  BEHAVIORAL SYMPTOMS/MOOD NEUROLOGICAL BOWEL NUTRITION STATUS      Continent Diet (Diet: Heart Healthy/ Carb Modified )  AMBULATORY STATUS COMMUNICATION OF NEEDS Skin   Extensive Assist Verbally Surgical wounds, Wound Vac (Incision: Left Knee, provena wound vac. )                       Personal Care Assistance Level of Assistance  Bathing, Feeding, Dressing  Bathing Assistance: Limited assistance Feeding assistance: Independent Dressing Assistance: Limited assistance     Functional Limitations Info  Sight, Hearing, Speech Sight Info: Adequate Hearing Info: Adequate Speech Info: Adequate    SPECIAL CARE FACTORS FREQUENCY  PT (By licensed PT), OT (By licensed OT)     PT Frequency:  (5) OT Frequency:  (5)            Contractures      Additional Factors Info  Code Status, Allergies Code Status Info:  (Full Code. ) Allergies Info:  (Iodinated Diagnostic Agents)           Current Medications (09/06/2017):  This is the current hospital active medication list Current Facility-Administered Medications  Medication Dose Route Frequency Provider Last Rate Last Dose  . 0.9 %  sodium chloride infusion   Intravenous Continuous Hessie Knows, MD 75 mL/hr at 09/06/17 1121    . bisacodyl (DULCOLAX) suppository 10 mg  10 mg Rectal Daily PRN Hessie Knows, MD      . ceFAZolin (ANCEF) 2 g in dextrose 5 % 100 mL injection   Intravenous Q6H Hessie Knows, MD   Stopped at 09/06/17 1643  . docusate sodium (COLACE) capsule 100 mg  100 mg Oral BID Hessie Knows, MD   100 mg at 09/06/17 1120  . HYDROcodone-acetaminophen (NORCO/VICODIN) 5-325 MG per tablet 1-2 tablet  1-2 tablet Oral Q4H PRN Hessie Knows, MD   1 tablet at 09/06/17 1412  . insulin aspart (novoLOG) injection 0-15 Units  0-15 Units Subcutaneous TID  WC Hessie Knows, MD   2 Units at 09/06/17 1202  . magnesium citrate solution 1 Bottle  1 Bottle Oral Once PRN Hessie Knows, MD      . magnesium hydroxide (MILK OF MAGNESIA) suspension 30 mL  30 mL Oral Daily PRN Hessie Knows, MD      . metoCLOPramide (REGLAN) tablet 5-10 mg  5-10 mg Oral Q8H PRN Hessie Knows, MD       Or  . metoCLOPramide (REGLAN) injection 5-10 mg  5-10 mg Intravenous Q8H PRN Hessie Knows, MD      . morphine 2 MG/ML injection 2 mg  2 mg Intravenous Q2H PRN Hessie Knows, MD   2 mg at 09/06/17 1613  . ondansetron (ZOFRAN)  tablet 4 mg  4 mg Oral Q6H PRN Hessie Knows, MD       Or  . ondansetron [facility]) injection 4 mg  4 mg Intravenous Q6H PRN Hessie Knows, MD         Discharge Medications: Please see discharge summary for a list of discharge medications.  Relevant Imaging Results:  Relevant Lab Results:   Additional Information  (SSN: [id number])  Lacrecia Delval, Veronia Beets, LCSW

## 2017-09-06 NOTE — Op Note (Addendum)
09/06/2017  8:36 AM  PATIENT:  Daniel Luna  76 y.o. male  PRE-OPERATIVE DIAGNOSIS:  instability of internal left knee prosthesis  POST-OPERATIVE DIAGNOSIS:  * No post-op diagnosis entered *Same  PROCEDURE:  Procedure(s): LEFT KNEE REVISION WITH POLY EXCHANGE, OPEN SYNOVECTOMY (Left)  SURGEON: Laurene Footman, MD  ASSISTANTS:  Arvella Nigh PA-C  ANESTHESIA:   spinal  EBL:  Total I/O In: 1100 [I.V.:1100] Out: 150 [Urine:150]  BLOOD ADMINISTERED:none  DRAINS: none   LOCAL MEDICATIONS USED:  OTHER exparel  SPECIMEN:  No Specimen  DISPOSITION OF SPECIMEN:  N/A  COUNTS:  YES  TOURNIQUET:  33 min at 300 mm Hg  IMPLANTS: 12 mminsert. Left size 5 GMK Sphere Flex  DICTATION: .Dragon Dictation  Polyu exchange done in usual fashion. After spinal anesthesia was obtained left leg was prepped and draped in usual sterile fashion and after patient identificatin and timeout procedures were completed tourniquet was raised. Prior incision was utilized and medial parapatellar arthrotomy then performed. The prior insert was removed and scar tissue removed medially and posteriorly which appeared to be impinging on the joint. Trials were placed the 12 mm insert gave good stability. After thorough irrigation of the knee and infiltration with exparelll the new insert was placed and locked into position and set screw laced with torque wrench.The arthrotomy was then repaired using a heavy Quill and the lock subcutaneous followed by skin staples and incisional wound VAC  PLAN OF CARE: Admit for overnight observation  PATIENT DISPOSITION:  PACU - hemodynamically stable.

## 2017-09-06 NOTE — Anesthesia Post-op Follow-up Note (Signed)
Anesthesia QCDR form completed.        

## 2017-09-06 NOTE — Anesthesia Preprocedure Evaluation (Addendum)
Anesthesia Evaluation  Patient identified by MRN, date of birth, ID band Patient awake    Reviewed: Allergy & Precautions, NPO status , Patient's Chart, lab work & pertinent test results, reviewed documented beta blocker date and time   Airway Mallampati: III  TM Distance: >3 FB     Dental  (+) Chipped, Missing   Pulmonary shortness of breath, former smoker,           Cardiovascular hypertension, Pt. on medications and Pt. on home beta blockers + angina + CAD, + Cardiac Stents and +CHF  + dysrhythmias      Neuro/Psych    GI/Hepatic   Endo/Other  diabetes, Type 2  Renal/GU      Musculoskeletal  (+) Arthritis ,   Abdominal   Peds  Hematology   Anesthesia Other Findings   Reproductive/Obstetrics                            Anesthesia Physical Anesthesia Plan  ASA: III  Anesthesia Plan: Spinal   Post-op Pain Management:    Induction:   PONV Risk Score and Plan:   Airway Management Planned:   Additional Equipment:   Intra-op Plan:   Post-operative Plan:   Informed Consent: I have reviewed the patients History and Physical, chart, labs and discussed the procedure including the risks, benefits and alternatives for the proposed anesthesia with the patient or authorized representative who has indicated his/her understanding and acceptance.     Plan Discussed with: CRNA  Anesthesia Plan Comments:         Anesthesia Quick Evaluation

## 2017-09-07 ENCOUNTER — Encounter: Payer: Self-pay | Admitting: Orthopedic Surgery

## 2017-09-07 DIAGNOSIS — T84023A Instability of internal left knee prosthesis, initial encounter: Secondary | ICD-10-CM | POA: Diagnosis not present

## 2017-09-07 LAB — GLUCOSE, CAPILLARY
GLUCOSE-CAPILLARY: 198 mg/dL — AB (ref 65–99)
Glucose-Capillary: 141 mg/dL — ABNORMAL HIGH (ref 65–99)

## 2017-09-07 MED ORDER — OXYCODONE HCL 5 MG PO TABS
5.0000 mg | ORAL_TABLET | ORAL | Status: DC | PRN
  Administered 2017-09-07: 10 mg via ORAL
  Filled 2017-09-07: qty 2

## 2017-09-07 MED ORDER — TRAMADOL HCL 50 MG PO TABS: 50.0000 mg | ORAL_TABLET | ORAL | Status: DC | PRN

## 2017-09-07 MED ORDER — DOCUSATE SODIUM 100 MG PO CAPS: 100.0000 mg | ORAL_CAPSULE | Freq: Two times a day (BID) | ORAL | 0 refills | Status: DC

## 2017-09-07 MED ORDER — ACETAMINOPHEN 500 MG PO TABS
1000.0000 mg | ORAL_TABLET | Freq: Four times a day (QID) | ORAL | Status: DC | PRN
  Administered 2017-09-07: 1000 mg via ORAL
  Filled 2017-09-07: qty 2

## 2017-09-07 MED ORDER — ASPIRIN 325 MG PO TABS
325.0000 mg | ORAL_TABLET | Freq: Every day | ORAL | Status: DC
  Administered 2017-09-07: 325 mg via ORAL
  Filled 2017-09-07: qty 1

## 2017-09-07 MED ORDER — ASPIRIN 325 MG PO TABS: 325.0000 mg | ORAL_TABLET | Freq: Every day | ORAL | 0 refills | Status: AC

## 2017-09-07 MED ORDER — OXYCODONE HCL 5 MG PO TABS: 5.0000 mg | ORAL_TABLET | ORAL | 0 refills | Status: DC | PRN

## 2017-09-07 NOTE — Discharge Summary (Signed)
Physician Discharge Summary  Patient ID: Daniel Luna MRN: [id number] DOB/AGE: June 21, 1941 76 y.o.  Admit date: 09/06/2017 Discharge date: 09/07/2017  Admission Diagnoses:  instability of internal left knee prosthesis   Discharge Diagnoses: Patient Active Problem List   Diagnosis Date Noted  . Instability of prosthetic knee ([place]) 09/06/2017  . Morbid obesity ([place]) 01/18/2017  . Shortness of breath 01/18/2017  . Primary localized osteoarthritis of left knee 09/21/2016  . Coronary artery disease of native artery of native heart with stable angina pectoris ([place]) 07/12/2016  . Congestive heart failure ([place]) 07/12/2016  . History of coronary artery stent placement 07/12/2016  . Hyperlipidemia 07/12/2016  . Cardiomyopathy, ischemic 07/12/2016  . 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  . Total knee replacement status 05/21/2015  . Encounter for screening colonoscopy 12/25/2013  . Sebaceous cyst 08/15/2013  . Infected sebaceous cyst of skin 05/23/2013    Past Medical History:  Diagnosis Date  . Arthritis   . CHF (congestive heart failure) ([place])   . Coronary artery disease    CAD s/p PCI to LAD and RCA 01/2012   . Cyst of brain 1983  . Diabetes mellitus without complication ([place])   . Dysrhythmia   . Heart failure ([place])   . Hyperlipidemia   . Hypertension   . Ischemic cardiomyopathy    LVEF 83-38 % (systolic and diastolic, NYHA I-II symptoms)   . Macular degeneration   . Shortness of breath dyspnea      Transfusion:none   Consultants (if any):   Discharged Condition: Improved  Hospital Course: Daniel Luna is an 76 y.o. male who was admitted 09/06/2017 with a diagnosis of left knee instability and went to the operating room on 09/06/2017 and underwent the above named procedures.    Surgeries: Procedure(s): LEFT KNEE REVISION WITH POLY EXCHANGE, OPEN SYNOVECTOMY on 09/06/2017 Patient tolerated the surgery well. Taken to PACU where she was stabilized and then transferred to the orthopedic  floor.  Started on aspirin daily. Foot pumps applied bilaterally at 80 mm. Heels elevated on bed with rolled towels. No evidence of DVT. Negative [place]. Physical therapy started on day #1 for gait training and transfer. OT started day #1 for ADL and assisted devices.  Patient's foley was d/c on day #1. Patient's IV was d/c on day #1.  On post op day #1 patient was stable and ready for discharge to home with HHPT.  Implants: 12 mminsert. Left size 5 GMK Sphere Flex  He was given perioperative antibiotics:  Anti-infectives    Start     Dose/Rate Route Frequency Ordered Stop   09/06/17 1330  ceFAZolin (ANCEF) IVPB 2g/100 mL premix  Status:  Discontinued     2 g 200 mL/hr over 30 Minutes Intravenous Every 6 hours 09/06/17 0953 09/06/17 1338   09/06/17 1330  ceFAZolin (ANCEF) 2 g in dextrose 5 % 100 mL injection     200 mL/hr over 30 Minutes Intravenous Every 6 hours 09/06/17 1338 09/07/17 0407   09/06/17 0559  ceFAZolin (ANCEF) 2-4 GM/100ML-% IVPB    Comments:  Larita Fife   : cabinet override      09/06/17 0559 09/06/17 0730   09/06/17 0045  ceFAZolin (ANCEF) IVPB 2g/100 mL premix     2 g 200 mL/hr over 30 Minutes Intravenous  Once 09/06/17 0041 09/06/17 0730    .  He was given sequential compression devices, early ambulation, and Aspirin 325 mg daukt for DVT prophylaxis.  He benefited maximally from the hospital stay and there  were no complications.    Recent vital signs:  Vitals:   09/07/17 0426 09/07/17 0731  BP: 136/71 (!) 160/86  Pulse: 84 93  Resp: 19   Temp: 98.7 F (37.1 C) 98.2 F (36.8 C)  SpO2: 97% 97%    Recent laboratory studies:  Lab Results  Component Value Date   HGB 14.2 08/26/2017   HGB 12.3 (L) 09/23/2016   HGB 12.8 (L) 09/22/2016   Lab Results  Component Value Date   WBC 7.3 08/26/2017   PLT 178 08/26/2017   Lab Results  Component Value Date   INR 0.99 08/26/2017   Lab Results  Component Value Date   NA 137 08/26/2017   K 4.5  08/26/2017   CL 100 (L) 08/26/2017   CO2 28 08/26/2017   BUN 23 (H) 08/26/2017   CREATININE 1.32 (H) 08/26/2017   GLUCOSE 79 08/26/2017    Discharge Medications:   Allergies as of 09/07/2017      Reactions   Iodinated Diagnostic Agents Hives      Medication List    STOP taking these medications   aspirin EC 81 MG tablet Replaced by:  aspirin 325 MG tablet     TAKE these medications   aspirin 325 MG tablet Take 1 tablet (325 mg total) by mouth daily. Replaces:  aspirin EC 81 MG tablet   carvedilol 12.5 MG tablet Commonly known as:  COREG TAKE 1 TABLET BY MOUTH TWICE DAILY   docusate sodium 100 MG capsule Commonly known as:  COLACE Take 1 capsule (100 mg total) by mouth 2 (two) times daily.   furosemide 20 MG tablet Commonly known as:  LASIX Take 1 tablet (20 mg total) by mouth daily.   lisinopril 40 MG tablet Commonly known as:  PRINIVIL,ZESTRIL Take 1 tablet (40 mg total) by mouth daily.   metFORMIN 1000 MG tablet Commonly known as:  GLUCOPHAGE Take 1,000 mg by mouth 2 (two) times daily with a meal.   oxyCODONE 5 MG immediate release tablet Commonly known as:  Oxy IR/ROXICODONE Take 1-2 tablets (5-10 mg total) by mouth every 4 (four) hours as needed for moderate pain or severe pain (PRN).   potassium chloride SA 20 MEQ tablet Commonly known as:  K-DUR,KLOR-CON TAKE ONE TABLET BY MOUTH EVERY DAY   simvastatin 80 MG tablet Commonly known as:  ZOCOR Take 1 tablet (80 mg total) by mouth at bedtime.   VISION VITAMINS PO Take 1 tablet by mouth 2 (two) times daily.            Discharge Care Instructions        Start     Ordered   09/08/17 0000  aspirin 325 MG tablet  Daily    Question:  Supervising Provider  Answer:  Hessie Knows   09/07/17 1137   09/07/17 0000  oxyCODONE (OXY IR/ROXICODONE) 5 MG immediate release tablet  Every 4 hours PRN    Question:  Supervising Provider  Answer:  Hessie Knows   09/07/17 1137   09/07/17 0000  docusate sodium  (COLACE) 100 MG capsule  2 times daily    Question:  Supervising Provider  AnswerHessie Knows   09/07/17 1137      Diagnostic Studies: No results found.  Disposition: 06-Home-Health Care Svc    Follow-up Information    Hessie Knows, MD Follow up in 2 week(s).   Specialty:  Orthopedic Surgery Contact information: [place] [address] [phone]  Signed: Dorise Hiss CHRISTOPHER 09/07/2017, 11:40 AM

## 2017-09-07 NOTE — Plan of Care (Signed)
Problem: Health Behavior/Discharge Planning: Goal: Ability to manage health-related needs will improve Outcome: Adequate for Discharge Equipped for self care at home with support from home RN and PT.

## 2017-09-07 NOTE — Progress Notes (Signed)
   Subjective: 1 Day Post-Op Procedure(s) (LRB): LEFT KNEE REVISION WITH POLY EXCHANGE, OPEN SYNOVECTOMY (Left) Patient reports pain as moderate.   Patient is well, and has had no acute complaints or problems Denies any CP, SOB, ABD pain. We will start therapy today.  Plan is to go Home after hospital stay.  Objective: Vital signs in last 24 hours: Temp:  [97 F (36.1 C)-100 F (37.8 C)] 98.2 F (36.8 C) (09/26 0731) Pulse Rate:  [66-98] 93 (09/26 0731) Resp:  [14-23] 19 (09/26 0426) BP: (117-160)/(67-92) 160/86 (09/26 0731) SpO2:  [96 %-100 %] 97 % (09/26 0731)  Intake/Output from previous day: 09/25 0701 - 09/26 0700 In: 2431.3 [P.O.:360; I.V.:1571.3] Out: 2475 [Urine:2450; Blood:25] Intake/Output this shift: No intake/output data recorded.  No results for input(s): HGB in the last 72 hours. No results for input(s): WBC, RBC, HCT, PLT in the last 72 hours. No results for input(s): NA, K, CL, CO2, BUN, CREATININE, GLUCOSE, CALCIUM in the last 72 hours. No results for input(s): LABPT, INR in the last 72 hours.  EXAM General - Patient is Alert, Appropriate and Oriented Extremity - Neurovascular intact Sensation intact distally Intact pulses distally Dorsiflexion/Plantar flexion intact No cellulitis present Compartment soft Dressing - dressing C/D/I and no drainage, wound vac intact Motor Function - intact, moving foot and toes well on exam.   Past Medical History:  Diagnosis Date  . Arthritis   . CHF (congestive heart failure) ([place])   . Coronary artery disease    CAD s/p PCI to LAD and RCA 01/2012   . Cyst of brain 1983  . Diabetes mellitus without complication ([place])   . Dysrhythmia   . Heart failure ([place])   . Hyperlipidemia   . Hypertension   . Ischemic cardiomyopathy    LVEF 08-65 % (systolic and diastolic, NYHA I-II symptoms)   . Macular degeneration   . Shortness of breath dyspnea     Assessment/Plan:   1 Day Post-Op Procedure(s) (LRB): LEFT KNEE  REVISION WITH POLY EXCHANGE, OPEN SYNOVECTOMY (Left) Active Problems:   Instability of prosthetic knee ([facility])  Estimated body mass index is 30.12 kg/m as calculated from the following:   Height as of this encounter: 6\' 3"  (1.905 m).   Weight as of this encounter: 109.3 kg (241 lb). Advance diet Up with therapy  DVT Prophylaxis - Aspirin, Foot Pumps and TED hose Weight-Bearing as tolerated to left leg Care management to assist with discharge to home with home health PT Possible discharged with a pending good progress in physical therapy  T. Rachelle Hora, PA-C [place] 09/07/2017, 8:12 AM

## 2017-09-07 NOTE — Care Management Obs Status (Signed)
[place] NOTIFICATION   Patient Details  Name: Daniel Luna MRN: [id number] Date of Birth: 1941/05/31   Medicare Observation Status Notification Given:  Yes    Marshell Garfinkel, RN 09/07/2017, 1:41 PM

## 2017-09-07 NOTE — Anesthesia Postprocedure Evaluation (Signed)
Anesthesia Post Note  Patient: Daniel Luna  Procedure(s) Performed: Procedure(s) (LRB): LEFT KNEE REVISION WITH POLY EXCHANGE, OPEN SYNOVECTOMY (Left)  Patient location during evaluation: Nursing Unit Anesthesia Type: Spinal Level of consciousness: awake, awake and alert and oriented Pain management: pain level controlled Vital Signs Assessment: post-procedure vital signs reviewed and stable Respiratory status: spontaneous breathing Cardiovascular status: blood pressure returned to baseline Postop Assessment: no headache, no backache, no apparent nausea or vomiting and adequate PO intake Anesthetic complications: no     Last Vitals:  Vitals:   09/07/17 0002 09/07/17 0426  BP: 137/78 136/71  Pulse: 98 84  Resp: 18 19  Temp: 37.8 C 37.1 C  SpO2: 96% 97%    Last Pain:  Vitals:   09/07/17 0711  TempSrc:   PainSc: 7                  Walker Paddack Lorenza Chick

## 2017-09-07 NOTE — Care Management Note (Signed)
Case Management Note  Patient Details  Name: Daniel Luna MRN: [id number] Date of Birth: 02-Mar-1941  Subjective/Objective:                   Met with patient to discuss discharge planning. He anticipates discharge to home today with his wife. He has a front wheeled walker available for use. He has used [facility] in the past and [facility] in the past. He chose [facility] this time for home health services. He has a front-wheeled walker for use at home. He uses [facility] for Rx.I do not anticipate Lovenox need at discharge- text Renelda Loma. PA to confirm.  Action/Plan:  Referral to [facility]. RNCM will follow.   Expected Discharge Date:  09/07/17               Expected Discharge Plan:     In-House Referral:     Discharge planning Services  CM Consult  Post Acute Care Choice:  Home Health Choice offered to:  Patient  DME Arranged:    DME Agency:     HH Arranged:  PT [place]:  [facility] ([facility])  Status of Service:  In process, will continue to follow  If discussed at Long Length of Stay Meetings, dates discussed:    Additional Comments:  Marshell Garfinkel, RN 09/07/2017, 10:35 AM

## 2017-09-07 NOTE — Progress Notes (Signed)
Physical Therapy Evaluation Patient Details Name: Daniel Luna MRN: [id number] DOB: 1941-11-15 Today's Date: 09/07/2017   History of Present Illness  Pt admitted for instability of prosthetic knee, s/p L knee revision with poly exchange open synovectomy on 9/25.   Clinical Impression  Pt is a pleasant 76 year old male admitted for instability of prosthetic knee on L. Pt performs there-ex, bed mobility with CGA, transfers with RW with min assist and amb with RW with CGA. Pt amb from room, to PT gym to practice stairs, and back to room with no increased pain or fatigue. Pt practiced going up 2 steps with backward facing technique and going down with forward facing technique, required continuous cues for proper sequencing, however appeared to perform safely. Pt appeared impulsive but motivated to perform all PT activities.  Pt demonstrates deficits with LLE ROM/strength/endurance, and endurance with stairs. Would benefit from skilled PT to address above deficits and promote optimal return to home. Recommend transition to [facility] upon DC from acute hospitalization. Pt demonstrates ability to perform 10 SLRs with independence, therefore does not require KI for mobility. Pt has wound vac, placed around neck during amb. This entire session was guided, instructed, and directly supervised by Greggory Stallion, DPT.    Follow Up Recommendations Home health PT    Equipment Recommendations  None recommended by PT    Recommendations for Other Services       Precautions / Restrictions Precautions Precautions: Knee;Fall (L knee) Precaution Booklet Issued: Yes (comment) Restrictions Weight Bearing Restrictions: No Other Position/Activity Restrictions: WBAT      Mobility  Bed Mobility Overal bed mobility: Needs Assistance Bed Mobility: Supine to Sit     Supine to sit: Min guard     General bed mobility comments: Pt able to rise from supine to seated EOB with CGA, provided cues for proper sequencing and  positioning.   Transfers Overall transfer level: Needs assistance Equipment used: Rolling walker (2 wheeled) Transfers: Sit to/from Stand Sit to Stand: Min assist         General transfer comment: Pt able to rise from seated EOB to standing with RW with min assist, cued to push with one hand on bed and one hand on RW, to keep LLE extended and push through RLE. Raised height of bed as pt is 6'3" tall.   Ambulation/Gait Ambulation/Gait assistance: Min guard Ambulation Distance (Feet): 200 Feet Assistive device: Rolling walker (2 wheeled) Gait Pattern/deviations: Step-through pattern     General Gait Details: Pt amb with step-to pattern leading with LLE, able to transition to reciprocal alternating step-through pattern without prompting. Able to keep RW moving forward continously.   Stairs Stairs: Yes Stairs assistance: Min assist Stair Management: No rails Number of Stairs: 2 General stair comments: Pt amb up stairs with RW using backward-facing technique to go up, and forward-facing to go down and demonstrated technique. Pt required continous cues and feedback to perform with proper technique. Pt appears strongth and able to perform safely. Pt stated he used to go up the stairs forward first with his previous knee operation, this is the first time he has tried the backward technique with the RW.   Wheelchair Mobility    Modified Rankin (Stroke Patients Only)       Balance Overall balance assessment: Needs assistance Sitting-balance support: Feet supported Sitting balance-Leahy Scale: Good Sitting balance - Comments: Pt able to sit at EOB with L knee extended for comfort, CGA. No LOB   Standing balance support: Bilateral upper  extremity supported Standing balance-Leahy Scale: Good Standing balance comment: Pt able to stand with RW with CGA, able to stand upright, no dizziness or LOB                             Pertinent Vitals/Pain Pain Assessment: 0-10 Pain  Score: 6  Pain Location: L knee Pain Descriptors / Indicators: Operative site guarding Pain Intervention(s): Limited activity within patient's tolerance;Monitored during session;Premedicated before session;Repositioned;Ice applied    Home Living Family/patient expects to be discharged to:: Private residence Living Arrangements: Spouse/significant other Available Help at Discharge: Family Type of Home: House Home Access: Stairs to enter Entrance Stairs-Rails: None Entrance Stairs-Number of Steps: 2 steps from garage (preferred entrance) or 3 steps from front entrance Home Layout: One level Home Equipment: Environmental consultant - 2 wheels;Cane - single point      Prior Function Level of Independence: Independent         Comments: Pt able to amb within home without assist. device, able to perform all ADLs independently     Hand Dominance        Extremity/Trunk Assessment   Upper Extremity Assessment Upper Extremity Assessment: Generalized weakness (UE MMT grossly 4/5)    Lower Extremity Assessment Lower Extremity Assessment: Generalized weakness (LE MMT grossly 4/5)    Cervical / Trunk Assessment Cervical / Trunk Assessment: Normal  Communication   Communication: No difficulties  Cognition Arousal/Alertness: Awake/alert Behavior During Therapy: WFL for tasks assessed/performed Overall Cognitive Status: Within Functional Limits for tasks assessed                                        General Comments      Exercises Total Joint Exercises Goniometric ROM: L knee AAROM 2-90 degrees Other Exercises Other Exercises: Supine ther-ex L side 10x, quad sets, SLRs, hip abd/add. Seated heel slides 10x on L. Required cues for proper technique.    Assessment/Plan    PT Assessment Patient needs continued PT services  PT Problem List Decreased strength;Decreased range of motion;Decreased activity tolerance;Decreased mobility;Decreased knowledge of use of DME;Pain        PT Treatment Interventions DME instruction;Gait training;Stair training;Functional mobility training;Therapeutic activities;Therapeutic exercise;Patient/family education    PT Goals (Current goals can be found in the Care Plan section)  Acute Rehab PT Goals Patient Stated Goal: to walk without pain PT Goal Formulation: With patient Time For Goal Achievement: 09/21/17 Potential to Achieve Goals: Good    Frequency BID   Barriers to discharge        Co-evaluation               AM-PAC PT "6 Clicks" Daily Activity  Outcome Measure Difficulty turning over in bed (including adjusting bedclothes, sheets and blankets)?: Unable Difficulty moving from lying on back to sitting on the side of the bed? : Unable Difficulty sitting down on and standing up from a chair with arms (e.g., wheelchair, bedside commode, etc,.)?: Unable Help needed moving to and from a bed to chair (including a wheelchair)?: A Little Help needed walking in hospital room?: A Little Help needed climbing 3-5 steps with a railing? : A Lot 6 Click Score: 11    End of Session Equipment Utilized During Treatment: Gait belt Activity Tolerance: Patient tolerated treatment well Patient left: in chair;with call bell/phone within reach;with chair alarm set Nurse Communication: Mobility status PT  Visit Diagnosis: Other abnormalities of gait and mobility (R26.89);Muscle weakness (generalized) (M62.81);Pain Pain - Right/Left: Left Pain - part of body: Knee    Time: 2956-2130 PT Time Calculation (min) (ACUTE ONLY): 40 min   Charges:   PT Evaluation $PT Eval Low Complexity: 1 Low PT Treatments $Gait Training: 8-22 mins $Therapeutic Exercise: 8-22 mins   PT G Codes:   PT G-Codes **NOT FOR INPATIENT CLASS** Functional Assessment Tool Used: AM-PAC 6 Clicks Basic Mobility Functional Limitation: Mobility: Walking and moving around Mobility: Walking and Moving Around Current Status (Q6578): At least 60 percent but less  than 80 percent impaired, limited or restricted Mobility: Walking and Moving Around Goal Status [id number]): At least 40 percent but less than 60 percent impaired, limited or restricted    Manfred Arch, SPT  Manfred Arch 09/07/2017, 2:16 PM

## 2017-09-07 NOTE — Care Management (Signed)
Tim with [facility] notified of patient discharge. No other RNCM needs.

## 2017-09-07 NOTE — Discharge Instructions (Signed)

## 2017-09-07 NOTE — Progress Notes (Signed)
PT is recommending home health. RN case manager aware of above. Please reconsult if future social work needs arise. CSW signing off.   Trenten Watchman, LCSW [phone]  

## 2017-09-07 NOTE — Care Management (Signed)
Post discharge note entry:  RNCM received call from Dr. Rudene Christians office stating that patient just called stating his VAC has been beeping since he left the hospital (literally a few minutes ago). It appears to be a 7-day disposable VAC. I have left message for Tim with [facility] to see if he could send PT out there to assess.Marland KitchenMarland KitchenMarland KitchenI spoke with Bedside [facility] RN and she is asking. RN has asked for patient to return to hospital. I spoke with patient/wife and they will bring him right now. When they get to the entrance they will call [phone] to get assistance with wheelchair. jancy notified.

## 2017-12-09 ENCOUNTER — Other Ambulatory Visit: Payer: Self-pay | Admitting: *Deleted

## 2017-12-09 ENCOUNTER — Telehealth: Payer: Self-pay | Admitting: Cardiovascular Disease

## 2017-12-09 MED ORDER — FUROSEMIDE 20 MG PO TABS: 20.0000 mg | ORAL_TABLET | Freq: Every day | ORAL | 3 refills | Status: DC

## 2017-12-09 NOTE — Telephone Encounter (Signed)
Requested Prescriptions   Signed Prescriptions Disp Refills  . furosemide (LASIX) 20 MG tablet 90 tablet 3    Sig: Take 1 tablet (20 mg total) by mouth daily.    Authorizing Provider: GOLLAN, TIMOTHY J    Ordering User: Dornell Grasmick C    

## 2017-12-09 NOTE — Telephone Encounter (Signed)
°*  STAT* If patient is at the pharmacy, call can be transferred to refill team.   1. Which medications need to be refilled? (please list name of each medication and dose if known) Furosemide 20 mg   2. Which pharmacy/location (including street and city if local pharmacy) is medication to be sent to? [facility] on [address]   3. Do they need a 30 day or 90 day supply? 90 day

## 2017-12-09 NOTE — Telephone Encounter (Signed)
Requested Prescriptions   Signed Prescriptions Disp Refills  . furosemide (LASIX) 20 MG tablet 90 tablet 3    Sig: Take 1 tablet (20 mg total) by mouth daily.    Authorizing Provider: GOLLAN, TIMOTHY J    Ordering User: LOPEZ, MARINA C    

## 2018-02-08 ENCOUNTER — Other Ambulatory Visit: Payer: Self-pay | Admitting: Cardiovascular Disease

## 2018-07-28 ENCOUNTER — Other Ambulatory Visit: Payer: Self-pay | Admitting: Cardiovascular Disease

## 2018-07-31 ENCOUNTER — Telehealth: Payer: Self-pay | Admitting: Cardiovascular Disease

## 2018-07-31 MED ORDER — CARVEDILOL 12.5 MG PO TABS: 12.5000 mg | ORAL_TABLET | Freq: Two times a day (BID) | ORAL | 0 refills | Status: DC

## 2018-07-31 NOTE — Telephone Encounter (Signed)
Patient calling in regards to carvedilol medication refill Pharmacy only received 30 day refill due to patient needing an appointment Patient has scheduled an appointment and would like to know if it can be switched to a 90 day prescription Please call to discuss

## 2018-07-31 NOTE — Telephone Encounter (Signed)
S/w patient. It is cheaper for him to have a 90-day Rx. Advised patient I will send in the 90-day and to please keep upcoming appointment as scheduled for further refills. He verbalized understanding and was very Patent attorney.

## 2018-08-27 NOTE — Progress Notes (Deleted)
Cardiology Office Note  Date:  08/27/2018   ID:  Daniel Luna, DOB 1941/06/08, MRN [id number]  PCP:  Idelle Crouch, MD   No chief complaint on file.   HPI:  Daniel Luna is a very pleasant 77 year old gentleman with history of  smoking who stopped >27 years ago,  coronary artery disease with stent 3 placed in 2013 at [facility] to the proximal to mid LAD, mid RCA and distal RCA,  hypertension,  hyperlipidemia who has cardiomyopathy,  ejection fraction 30-35%,  Pet stress test 08/2015: no ischemia, EF 35%, PVCs noted Echocardiogram :ejection fraction 30-35% in 2016 who presents For routine follow-up of his coronary artery disease  In follow-up today he reports that he needs a redo knee replacement on the left Scheduled for surgery with Dr. Rudene Christians in September 2018 At baseline, Does bike for exercise and weights at gym, [employer] Unable to treadmill secondary to chronic knee pain Denies any anginal symptoms,  He does have some chronic mild stable shortness of breath on heavy exertion  Getting over a cold/URI. 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   stress test October 2016, Low risk study small apical defect, ejection fraction 36%  Lab work reviewed with him Total chol 151, LDL 82 from 2017  EKG on today's visit shows normal sinus rhythm with rate 79 bpm, right bundle branch block, left anterior fascicular block, rare PVCs  Other past medical history reviewed He reports having shortness of breath prior to previous stents in 2013 Stent to 70% mid RCA lesion 3.5 x 15 mm xience, Stent to 80% distal RCA 3.5 x 15 mm xience Stent to mid LAD 80% lesion 3.0 x 23 mm xience    PMH:   has a past medical history of Arthritis, CHF (congestive heart failure) ([place]), Coronary artery disease, Cyst of brain (1983), Diabetes mellitus without complication ([place]), Dysrhythmia, Heart failure ([place]), Hyperlipidemia, Hypertension, Ischemic cardiomyopathy, Macular degeneration, and Shortness of  breath dyspnea.  PSH:    Past Surgical History:  Procedure Laterality Date  . APPENDECTOMY  1997  . CARDIAC CATHETERIZATION    . COLONOSCOPY  2005   4 polyps  . crainectomy    . crainoplasty    . CYST EXCISION  08-06-13   back  . cyst removal brain  1983   benign dermatoid cyst  . EYE SURGERY Bilateral 2008, 2012  . stents  2013   3 placed  . TOTAL KNEE ARTHROPLASTY Right 05/21/2015   Procedure: TOTAL KNEE ARTHROPLASTY;  Surgeon: Dereck Leep, MD;  Location: [facility] ORS;  Service: Orthopedics;  Laterality: Right;  . TOTAL KNEE ARTHROPLASTY Left 09/21/2016   Procedure: TOTAL KNEE ARTHROPLASTY;  Surgeon: Hessie Knows, MD;  Location: [facility] ORS;  Service: Orthopedics;  Laterality: Left;  . TOTAL KNEE REVISION Left 09/06/2017   Procedure: LEFT KNEE REVISION WITH POLY EXCHANGE, OPEN SYNOVECTOMY;  Surgeon: Hessie Knows, MD;  Location: [facility] ORS;  Service: Orthopedics;  Laterality: Left;    Current Outpatient Medications  Medication Sig Dispense Refill  . carvedilol (COREG) 12.5 MG tablet Take 1 tablet (12.5 mg total) by mouth 2 (two) times daily. 90 tablet 0  . docusate sodium (COLACE) 100 MG capsule Take 1 capsule (100 mg total) by mouth 2 (two) times daily. 10 capsule 0  . furosemide (LASIX) 20 MG tablet Take 1 tablet (20 mg total) by mouth daily. 90 tablet 3  . lisinopril (PRINIVIL,ZESTRIL) 40 MG tablet Take 1 tablet (40 mg total) by mouth daily. 90 tablet  3  . metFORMIN (GLUCOPHAGE) 1000 MG tablet Take 1,000 mg by mouth 2 (two) times daily with a meal.    . Multiple Vitamins-Minerals (VISION VITAMINS PO) Take 1 tablet by mouth 2 (two) times daily.    Marland Kitchen oxyCODONE (OXY IR/ROXICODONE) 5 MG immediate release tablet Take 1-2 tablets (5-10 mg total) by mouth every 4 (four) hours as needed for moderate pain or severe pain (PRN). 30 tablet 0  . potassium chloride SA (K-DUR,KLOR-CON) 20 MEQ tablet TAKE ONE TABLET BY MOUTH EVERY DAY 30 tablet 6  . simvastatin (ZOCOR) 80 MG tablet Take 1 tablet (80  mg total) by mouth at bedtime. 90 tablet 3   No current facility-administered medications for this visit.      Allergies:   Iodinated diagnostic agents   Social History:  The patient  reports that he quit smoking about 29 years ago. His smoking use included cigarettes. He has a 54.00 pack-year smoking history. He has never used smokeless tobacco. He reports that he drinks alcohol. He reports that he does not use drugs.   Family History:   family history includes Throat cancer in his mother.    Review of Systems: Review of Systems  Constitutional: Negative.        Weight gain  Respiratory: Positive for shortness of breath.   Cardiovascular: Negative.   Gastrointestinal: Negative.   Musculoskeletal: Positive for joint pain.  Neurological: Negative.   Psychiatric/Behavioral: Negative.   All other systems reviewed and are negative.    PHYSICAL EXAM: VS:  There were no vitals taken for this visit. , BMI There is no height or weight on file to calculate BMI. GEN: Well nourished, well developed, in no acute distress,   obese  HEENT: normal  Neck: no JVD, carotid bruits, or masses Cardiac: RRR; no murmurs, rubs, or gallops,no edema  Respiratory:  clear to auscultation bilaterally, normal work of breathing GI: soft, nontender, nondistended, + BS MS: no deformity or atrophy  Skin: warm and dry, no rash Neuro:  Strength and sensation are intact Psych: euthymic mood, full affect    Recent Labs: No results found for requested labs within last 8760 hours.    Lipid Panel No results found for: CHOL, HDL, LDLCALC, TRIG    Wt Readings from Last 3 Encounters:  09/06/17 241 lb (109.3 kg)  08/26/17 241 lb (109.3 kg)  07/25/17 241 lb 8 oz (109.5 kg)       ASSESSMENT AND PLAN:  Preoperative cardiovascular Acceptable risk for redo total knee replacement No further testing needed  Atherosclerosis of native coronary artery without angina pectoris, unspecified whether native or  transplanted heart - Plan: EKG 12-Lead Currently with no symptoms of angina. No further workup at this time. Continue current medication regimen.  Congestive heart failure, unspecified congestive heart failure chronicity, unspecified congestive heart failure type ([place]) - Plan: EKG 12-Lead Appears euvolemic on today's visit, Denies leg swelling, worsening abdominal bloating, cough when supine Long discussion concerning changing his lisinopril to entresto. Discussed the benefits. He will read about it, call us back after he checks the price.   Hyperlipidemia, unspecified hyperlipidemia type - Plan: EKG 12-Lead Cholesterol mildly above goal, recommended weight loss, stay on simvastatin  Cardiomyopathy, ischemic - Plan: EKG 12-Lead Discussed previous ejection fraction with him 30-35% in 2016 He is not interested in evaluation for ICD. Reports this is why he left the previous cardiologist at [facility], did not feel this was necessary Currently not interested in changing to entresto. Prefers to check  the price  Morbid obesity ([place]) We have encouraged continued exercise, careful diet management in an effort to lose weight.  Shortness of breath Multifactorial including deconditioning, morbid obesity, mild COPD, cardiomyopathy Recommended he continue exercise program   Total encounter time more than 25 minutes  Greater than 50% was spent in counseling and coordination of care with the patient   Disposition:   F/U  12 months   No orders of the defined types were placed in this encounter.    Signed, Esmond Plants, M.D., Ph.D. 08/27/2018  [place], [place]

## 2018-08-28 ENCOUNTER — Ambulatory Visit: Payer: Medicare Other | Admitting: Cardiovascular Disease

## 2018-08-28 NOTE — Progress Notes (Signed)
Cardiology Office Note  Date:  08/29/2018   ID:  Daniel Luna, DOB Nov 02, 1941, MRN [id number]  PCP:  Idelle Crouch, MD   Chief Complaint  Patient presents with  . other    12 mo follow up. Medications verbally reviewed.     HPI:  Daniel Luna is a very pleasant 77 year old gentleman with history of  smoking who stopped >27 years ago,  coronary artery disease with stent 3 placed in 2013 at [facility] to the proximal to mid LAD, mid RCA and distal RCA,  hypertension,  hyperlipidemia   Pet stress test 08/2015: no ischemia, EF 35%, PVCs noted Echocardiogram :ejection fraction 30-35% in 2016 who presents For routine follow-up of his coronary artery disease  On last visit, he declined changing his lisinopril to entresto.  In follow-up today he is now retired More SOB, wonders if it could be the heat Trying to do walking Has to stop to recover Denies any significant chest pain on exertion   Does bike for exercise and weights at gym, [employer] Weight down 11 pounds  Lab work reviewed with him Total chol 141, LDL 79   stress test October 2016, Low risk study small apical defect, ejection fraction 36%  EKG personally reviewed by myself on todays visit Shows normal sinus rhythm rate 73 bpm right bundle branch block, left anterior fascicular block  Other past medical history reviewed He reports having shortness of breath prior to previous stents in 2013 Stent to 70% mid RCA lesion 3.5 x 15 mm xience, Stent to 80% distal RCA 3.5 x 15 mm xience Stent to mid LAD 80% lesion 3.0 x 23 mm xience    PMH:   has a past medical history of Arthritis, CHF (congestive heart failure) ([place]), Coronary artery disease, Cyst of brain (1983), Diabetes mellitus without complication ([place]), Dysrhythmia, Heart failure ([place]), Hyperlipidemia, Hypertension, Ischemic cardiomyopathy, Macular degeneration, and Shortness of breath dyspnea.  PSH:    Past Surgical History:  Procedure Laterality Date  . APPENDECTOMY   1997  . CARDIAC CATHETERIZATION    . COLONOSCOPY  2005   4 polyps  . crainectomy    . crainoplasty    . CYST EXCISION  08-06-13   back  . cyst removal brain  1983   benign dermatoid cyst  . EYE SURGERY Bilateral 2008, 2012  . stents  2013   3 placed  . TOTAL KNEE ARTHROPLASTY Right 05/21/2015   Procedure: TOTAL KNEE ARTHROPLASTY;  Surgeon: Dereck Leep, MD;  Location: [facility];  Service: Orthopedics;  Laterality: Right;  . TOTAL KNEE ARTHROPLASTY Left 09/21/2016   Procedure: TOTAL KNEE ARTHROPLASTY;  Surgeon: Hessie Knows, MD;  Location: [facility];  Service: Orthopedics;  Laterality: Left;  . TOTAL KNEE REVISION Left 09/06/2017   Procedure: LEFT KNEE REVISION WITH POLY EXCHANGE, OPEN SYNOVECTOMY;  Surgeon: Hessie Knows, MD;  Location: [facility];  Service: Orthopedics;  Laterality: Left;    Current Outpatient Medications  Medication Sig Dispense Refill  . carvedilol (COREG) 12.5 MG tablet Take 1 tablet (12.5 mg total) by mouth 2 (two) times daily. 90 tablet 0  . docusate sodium (COLACE) 100 MG capsule Take 1 capsule (100 mg total) by mouth 2 (two) times daily. 10 capsule 0  . furosemide (LASIX) 20 MG tablet Take 1 tablet (20 mg total) by mouth daily. 90 tablet 3  . lisinopril (PRINIVIL,ZESTRIL) 40 MG tablet Take 1 tablet (40 mg total) by mouth daily. 90 tablet 3  . metFORMIN (GLUCOPHAGE) 1000 MG tablet  Take 1,000 mg by mouth 2 (two) times daily with a meal.    . potassium chloride SA (K-DUR,KLOR-CON) 20 MEQ tablet TAKE ONE TABLET BY MOUTH EVERY DAY 30 tablet 6  . simvastatin (ZOCOR) 80 MG tablet Take 1 tablet (80 mg total) by mouth at bedtime. 90 tablet 3   No current facility-administered medications for this visit.      Allergies:   Iodinated diagnostic agents   Social History:  The patient  reports that he quit smoking about 29 years ago. His smoking use included cigarettes. He has a 54.00 pack-year smoking history. He has never used smokeless tobacco. He reports that he drinks  alcohol. He reports that he does not use drugs.   Family History:   family history includes Throat cancer in his mother.    Review of Systems: Review of Systems  Constitutional: Negative.   Respiratory: Positive for shortness of breath.   Cardiovascular: Negative.   Gastrointestinal: Negative.   Musculoskeletal: Positive for joint pain.  Neurological: Negative.   Psychiatric/Behavioral: Negative.   All other systems reviewed and are negative.    PHYSICAL EXAM: VS:  BP 122/66 (BP Location: Left Arm, Patient Position: Sitting, Cuff Size: Normal)   Pulse 73   Ht 6\' 3"  (1.905 m)   Wt 230 lb (104.3 kg)   BMI 28.75 kg/m  , BMI Body mass index is 28.75 kg/m. Constitutional:  oriented to person, place, and time. No distress.  HENT:  Head: Normocephalic and atraumatic.  Eyes:  no discharge. No scleral icterus.  Neck: Normal range of motion. Neck supple. No JVD present.  Cardiovascular: Normal rate, regular rhythm, normal heart sounds and intact distal pulses. Exam reveals no gallop and no friction rub. No edema No murmur heard. Pulmonary/Chest: Effort normal and breath sounds normal. No stridor. No respiratory distress.  no wheezes.  no rales.  no tenderness.  Abdominal: Soft.  no distension.  no tenderness.  Musculoskeletal: Normal range of motion.  no  tenderness or deformity.  Neurological:  normal muscle tone. Coordination normal. No atrophy Skin: Skin is warm and dry. No rash noted. not diaphoretic.  Psychiatric:  normal mood and affect. behavior is normal. Thought content normal.    Recent Labs: No results found for requested labs within last 8760 hours.    Lipid Panel No results found for: CHOL, HDL, LDLCALC, TRIG    Wt Readings from Last 3 Encounters:  08/29/18 230 lb (104.3 kg)  09/06/17 241 lb (109.3 kg)  08/26/17 241 lb (109.3 kg)       ASSESSMENT AND PLAN:  Atherosclerosis of native coronary artery without angina pectoris, unspecified whether native or  transplanted heart - Plan: EKG 12-Lead Denies any anginal symptoms He is having some shortness of breath Stress testing was offered.  He has declined at this time but will call us if shortness of breath gets worse Lipids at goal.  EKG changes.  With good exercise tolerance  Congestive heart failure, unspecified congestive heart failure chronicity, unspecified congestive heart failure type ([place]) -  Appears euvolemic on today's visit, No leg swelling or abdominal bloating.  Creatinine 1.3 with BUN 24 For any worsening shortness of breath, We suggested he could try extra Lasix but will take this sparingly  Hyperlipidemia, unspecified hyperlipidemia type -  Cholesterol is at goal on the current lipid regimen. No changes to the medications were made.  Better with recent weight loss  Cardiomyopathy, ischemic - Discussed previous ejection fraction with him 30-35% in 2016 He is  not interested in evaluation for ICD. Previously not interested in changing to entresto. He does have shortness of breath but is exercising relatively well He will call us if shortness of breath gets worse.  Echocardiogram and stress test could be performed  Morbid obesity ([place]) We have encouraged continued exercise, careful diet management in an effort to lose weight.  He has started to lose weight  Shortness of breath Multifactorial including deconditioning, morbid obesity, mild COPD, cardiomyopathy Recommended he continue exercise program Again discussed with him   Total encounter time more than 25 minutes  Greater than 50% was spent in counseling and coordination of care with the patient   Disposition:   F/U  12 months   Orders Placed This Encounter  Procedures  . EKG 12-Lead     Signed, Esmond Plants, M.D., Ph.D. 08/29/2018  [place], [place]

## 2018-08-29 ENCOUNTER — Ambulatory Visit: Payer: Medicare Other | Admitting: Cardiovascular Disease

## 2018-08-29 ENCOUNTER — Encounter: Payer: Self-pay | Admitting: Cardiovascular Disease

## 2018-08-29 VITALS — BP 122/66 | HR 73 | Ht 75.0 in | Wt 230.0 lb

## 2018-08-29 DIAGNOSIS — I5042 Chronic combined systolic (congestive) and diastolic (congestive) heart failure: Secondary | ICD-10-CM

## 2018-08-29 DIAGNOSIS — I25118 Atherosclerotic heart disease of native coronary artery with other forms of angina pectoris: Secondary | ICD-10-CM

## 2018-08-29 DIAGNOSIS — E782 Mixed hyperlipidemia: Secondary | ICD-10-CM

## 2018-08-29 DIAGNOSIS — R0602 Shortness of breath: Secondary | ICD-10-CM

## 2018-08-29 DIAGNOSIS — I255 Ischemic cardiomyopathy: Secondary | ICD-10-CM

## 2018-08-29 NOTE — Patient Instructions (Signed)
  If shortness of breath gets worse, Call the office We could order an echocardiogram and or stress test  Medication Instructions:   No medication changes made  Labwork:  No new labs needed  Testing/Procedures:  No further testing at this time   Follow-Up: It was a pleasure seeing you in the office today. Please call us if you have new issues that need to be addressed before your next appt.  [phone]  Your physician wants you to follow-up in: 12 months.  You will receive a reminder letter in the mail two months in advance. If you don't receive a letter, please call our office to schedule the follow-up appointment.  If you need a refill on your cardiac medications before your next appointment, please call your pharmacy.  For educational health videos Log in to : [email or web address] Or : [email or web address], password : [email or web address]

## 2018-09-13 ENCOUNTER — Other Ambulatory Visit: Payer: Self-pay | Admitting: Cardiovascular Disease

## 2018-09-13 MED ORDER — CARVEDILOL 12.5 MG PO TABS: 12.5000 mg | ORAL_TABLET | Freq: Two times a day (BID) | ORAL | 3 refills | Status: DC

## 2018-09-13 NOTE — Telephone Encounter (Signed)
°*  STAT* If patient is at the pharmacy, call can be transferred to refill team.   1. Which medications need to be refilled? (please list name of each medication and dose if known)  Carvedilol (COREG) 12.5 MG - 1 tablet 2 times daily  2. Which pharmacy/location (including street and city if local pharmacy) is medication to be sent to? [facility] on [address]  3. Do they need a 30 day or 90 day supply? 90 day

## 2018-09-13 NOTE — Telephone Encounter (Signed)
Patient wants 90 day supply.

## 2018-09-19 ENCOUNTER — Ambulatory Visit: Payer: Medicare Other | Admitting: Cardiovascular Disease

## 2018-12-08 ENCOUNTER — Other Ambulatory Visit: Payer: Self-pay | Admitting: Cardiovascular Disease

## 2019-02-14 ENCOUNTER — Other Ambulatory Visit: Payer: Self-pay | Admitting: Cardiovascular Disease

## 2019-08-30 NOTE — Progress Notes (Signed)
Cardiology Office Note  Date:  09/03/2019   ID:  Daniel, Luna [birth year], MRN [id number]  PCP:  Daniel Crouch, MD   Chief Complaint  Patient presents with  . Other    12 month follow up. Patient c/o SOB. Meds reviewed verbally with patient.     HPI:  Daniel Luna is a very pleasant 78 year old gentleman with history of  smoking who stopped >27 years ago,  coronary artery disease with stent 3 placed in 2013 at [facility] to the proximal to mid LAD, mid RCA and distal RCA,  hypertension,  hyperlipidemia   Pet stress test 08/2015: no ischemia, EF 35%, PVCs noted Echocardiogram :ejection fraction 30-35% in 2016 who presents For routine follow-up of his coronary artery disease  Weight trending down Started going back to [facility] with weight to lose  Restarted his bike for exercise and weights at gym Mild chronic SOB  HBA1C 6.4 Total chol 150 LDL 80  On last visit, he declined changing his lisinopril to entresto.  EKG personally reviewed by myself on todays visit Shows normal sinus rhythm rate 73 bpm right bundle branch block, left anterior fascicular block  Other past medical history reviewed   stress test October 2016, Low risk study small apical defect, ejection fraction 36%  He reports having shortness of breath prior to previous stents in 2013 Stent to 70% mid RCA lesion 3.5 x 15 mm xience, Stent to 80% distal RCA 3.5 x 15 mm xience Stent to mid LAD 80% lesion 3.0 x 23 mm xience    PMH:   has a past medical history of Arthritis, CHF (congestive heart failure) ([place]), Coronary artery disease, Cyst of brain (1983), Diabetes mellitus without complication ([place]), Dysrhythmia, Heart failure ([place]), Hyperlipidemia, Hypertension, Ischemic cardiomyopathy, Macular degeneration, and Shortness of breath dyspnea.  PSH:    Past Surgical History:  Procedure Laterality Date  . APPENDECTOMY  1997  . CARDIAC CATHETERIZATION    . COLONOSCOPY  2005   4 polyps  . crainectomy     . crainoplasty    . CYST EXCISION  08-06-13   back  . cyst removal brain  1983   benign dermatoid cyst  . EYE SURGERY Bilateral 2008, 2012  . stents  2013   3 placed  . TOTAL KNEE ARTHROPLASTY Right 05/21/2015   Procedure: TOTAL KNEE ARTHROPLASTY;  Surgeon: Dereck Leep, MD;  Location: [facility] ORS;  Service: Orthopedics;  Laterality: Right;  . TOTAL KNEE ARTHROPLASTY Left 09/21/2016   Procedure: TOTAL KNEE ARTHROPLASTY;  Surgeon: Hessie Knows, MD;  Location: [facility] ORS;  Service: Orthopedics;  Laterality: Left;  . TOTAL KNEE REVISION Left 09/06/2017   Procedure: LEFT KNEE REVISION WITH POLY EXCHANGE, OPEN SYNOVECTOMY;  Surgeon: Hessie Knows, MD;  Location: [facility] ORS;  Service: Orthopedics;  Laterality: Left;    Current Outpatient Medications  Medication Sig Dispense Refill  . furosemide (LASIX) 20 MG tablet TAKE 1 TABLET(20 MG) BY MOUTH DAILY 90 tablet 3  . lisinopril (PRINIVIL,ZESTRIL) 40 MG tablet Take 1 tablet (40 mg total) by mouth daily. 90 tablet 3  . metFORMIN (GLUCOPHAGE) 1000 MG tablet Take 1,000 mg by mouth 2 (two) times daily with a meal.    . potassium chloride SA (K-DUR,KLOR-CON) 20 MEQ tablet TAKE ONE TABLET BY MOUTH EVERY DAY 30 tablet 3  . sertraline (ZOLOFT) 50 MG tablet Take 50 mg by mouth daily.    . simvastatin (ZOCOR) 80 MG tablet Take 1 tablet (80 mg total) by mouth at  bedtime. 90 tablet 3  . carvedilol (COREG) 12.5 MG tablet Take 1 tablet (12.5 mg total) by mouth 2 (two) times daily. 180 tablet 3   No current facility-administered medications for this visit.      Allergies:   Iodinated diagnostic agents   Social History:  The patient  reports that he quit smoking about 30 years ago. His smoking use included cigarettes. He has a 54.00 pack-year smoking history. He has never used smokeless tobacco. He reports current alcohol use. He reports that he does not use drugs.   Family History:   family history includes Throat cancer in his mother.    Review of  Systems: Review of Systems  Constitutional: Negative.   Respiratory: Positive for shortness of breath.   Cardiovascular: Negative.   Gastrointestinal: Negative.   Musculoskeletal: Positive for joint pain.  Neurological: Negative.   Psychiatric/Behavioral: Negative.   All other systems reviewed and are negative.    PHYSICAL EXAM: VS:  BP 140/74 (BP Location: Left Arm, Patient Position: Sitting, Cuff Size: Normal)   Pulse 64   Ht 6\' 3"  (1.905 m)   Wt 226 lb (102.5 kg)   BMI 28.25 kg/m  , BMI Body mass index is 28.25 kg/m. Constitutional:  oriented to person, place, and time. No distress.  HENT:  Head: Grossly normal Eyes:  no discharge. No scleral icterus.  Neck: No JVD, no carotid bruits  Cardiovascular: Regular rate and rhythm, no murmurs appreciated Pulmonary/Chest: Clear to auscultation bilaterally, no wheezes or rails Abdominal: Soft.  no distension.  no tenderness.  Musculoskeletal: Normal range of motion Neurological:  normal muscle tone. Coordination normal. No atrophy Skin: Skin warm and dry Psychiatric: normal affect, pleasant   Recent Labs: No results found for requested labs within last 8760 hours.    Lipid Panel No results found for: CHOL, HDL, LDLCALC, TRIG    Wt Readings from Last 3 Encounters:  09/03/19 226 lb (102.5 kg)  08/29/18 230 lb (104.3 kg)  09/06/17 241 lb (109.3 kg)       ASSESSMENT AND PLAN:  Atherosclerosis of native coronary artery without angina pectoris, unspecified whether native or transplanted heart - Currently with no symptoms of angina. No further workup at this time. Continue current medication regimen.  Congestive heart failure, unspecified congestive heart failure chronicity, unspecified congestive heart failure type ([place]) -  Appears euvolemic, no medication changes Previously declined repeat echocardiogram Was not interested in medication changes  Hyperlipidemia, unspecified hyperlipidemia type -  Cholesterol is at  goal on the current lipid regimen. No changes to the medications were made.  Cardiomyopathy, ischemic - Discussed previous ejection fraction with him 30-35% in 2016 He is not interested in evaluation for ICD. Previously not interested in changing to entresto. No change in symptoms  Morbid obesity ([facility])  losing weight  Shortness of breath  mild COPD, cardiomyopathy Suggested exercise  Offered albuterol   Total encounter time more than 25 minutes  Greater than 50% was spent in counseling and coordination of care with the patient   Disposition:   F/U  12 months   No orders of the defined types were placed in this encounter.    Signed, Esmond Plants, M.D., Ph.D. 09/03/2019  [place], [place]

## 2019-09-03 ENCOUNTER — Ambulatory Visit: Payer: Medicare Other | Admitting: Cardiovascular Disease

## 2019-09-03 ENCOUNTER — Other Ambulatory Visit: Payer: Self-pay

## 2019-09-03 ENCOUNTER — Encounter: Payer: Self-pay | Admitting: Cardiovascular Disease

## 2019-09-03 VITALS — BP 140/74 | HR 64 | Ht 75.0 in | Wt 226.0 lb

## 2019-09-03 DIAGNOSIS — I5042 Chronic combined systolic (congestive) and diastolic (congestive) heart failure: Secondary | ICD-10-CM | POA: Diagnosis not present

## 2019-09-03 DIAGNOSIS — E782 Mixed hyperlipidemia: Secondary | ICD-10-CM | POA: Diagnosis not present

## 2019-09-03 DIAGNOSIS — I25118 Atherosclerotic heart disease of native coronary artery with other forms of angina pectoris: Secondary | ICD-10-CM

## 2019-09-03 DIAGNOSIS — I255 Ischemic cardiomyopathy: Secondary | ICD-10-CM

## 2019-09-03 NOTE — Patient Instructions (Signed)

## 2019-09-13 ENCOUNTER — Other Ambulatory Visit: Payer: Self-pay | Admitting: *Deleted

## 2019-09-13 MED ORDER — CARVEDILOL 12.5 MG PO TABS: 12.5000 mg | ORAL_TABLET | Freq: Two times a day (BID) | ORAL | 2 refills | Status: DC

## 2019-10-02 ENCOUNTER — Other Ambulatory Visit: Payer: Self-pay | Admitting: Cardiovascular Disease

## 2020-02-25 ENCOUNTER — Other Ambulatory Visit: Payer: Self-pay | Admitting: *Deleted

## 2020-02-25 MED ORDER — POTASSIUM CHLORIDE CRYS ER 20 MEQ PO TBCR: 20.0000 meq | EXTENDED_RELEASE_TABLET | Freq: Every day | ORAL | 6 refills | Status: DC

## 2020-02-25 NOTE — Telephone Encounter (Signed)
Requested Prescriptions   Signed Prescriptions Disp Refills  . potassium chloride SA (KLOR-CON) 20 MEQ tablet 30 tablet 6    Sig: Take 1 tablet (20 mEq total) by mouth daily.    Authorizing Provider: Minna Merritts    Ordering User: Britt Bottom

## 2020-03-06 DIAGNOSIS — Z96653 Presence of artificial knee joint, bilateral: Secondary | ICD-10-CM | POA: Insufficient documentation

## 2020-03-31 ENCOUNTER — Telehealth: Payer: Self-pay | Admitting: Cardiovascular Disease

## 2020-03-31 NOTE — Telephone Encounter (Signed)
Pt c/o Shortness Of Breath: STAT if SOB developed within the last 24 hours or pt is noticeably SOB on the phone  1. Are you currently SOB (can you hear that pt is SOB on the phone)? yes  2. How long have you been experiencing SOB? 2-3 weeks  3. Are you SOB when sitting or when up moving around? Up moving around, doing outside work, worse with a mask on  4. Are you currently experiencing any other symptoms? Insomnia, fatigue

## 2020-03-31 NOTE — Telephone Encounter (Signed)
Pt reports steady inc in SOB since last OV. Wal[place] the mall and wears mask, has been unable to tolerate for more than 10 min.   Weight today, 232 lbs.  Weight at last OV 09/03/19 226 lbs.  No leg swelling. Saw pcp in march and every thing checked out okay.  Having difficulty sleeping d/t dyspnea. Pt speaks in full sentences on the phone. No audible distress noted. Does not take vs at home and has no current readings. Said they were fine at PCP appt.   Vs from PCP appt Vital Sign Reading Time Taken Comments  Blood Pressure 120/76 02/14/2020 8:57 AM EST   Pulse 75 02/14/2020 8:57 AM EST   Temperature - -   Respiratory Rate - -   Oxygen Saturation 98% 02/14/2020 8:57 AM EST   Inhaled Oxygen Concentration - -   Weight 103.9 kg (229 lb) 02/14/2020 8:57 AM EST    Routing to provider to further advise.

## 2020-04-01 NOTE — Telephone Encounter (Signed)
Patient calling for response

## 2020-04-01 NOTE — Telephone Encounter (Signed)
Spoke with patient and he is very short of breath with some tightness in the chest. He states that he walks frequently and that he normally has some level of shortness of breath but this is so much worse now. He reported that even getting dressed was difficult. Offered him appointment with APP tomorrow to come in so we can determine how we can assist. He confirmed appointment information and instructed him to arrive early because he would need to go through the [facility] entrance to come to our office. He verbalized understanding with no further questions at this time.

## 2020-04-01 NOTE — Telephone Encounter (Signed)
Patient calling back for a response.

## 2020-04-02 ENCOUNTER — Other Ambulatory Visit: Payer: Self-pay

## 2020-04-02 ENCOUNTER — Ambulatory Visit: Payer: Medicare Other | Admitting: Family

## 2020-04-02 ENCOUNTER — Encounter: Payer: Self-pay | Admitting: Family

## 2020-04-02 VITALS — BP 130/80 | HR 89 | Ht 75.0 in | Wt 237.0 lb

## 2020-04-02 DIAGNOSIS — I5020 Unspecified systolic (congestive) heart failure: Secondary | ICD-10-CM | POA: Diagnosis not present

## 2020-04-02 DIAGNOSIS — E785 Hyperlipidemia, unspecified: Secondary | ICD-10-CM | POA: Diagnosis not present

## 2020-04-02 DIAGNOSIS — R0609 Other forms of dyspnea: Secondary | ICD-10-CM

## 2020-04-02 DIAGNOSIS — I25118 Atherosclerotic heart disease of native coronary artery with other forms of angina pectoris: Secondary | ICD-10-CM

## 2020-04-02 DIAGNOSIS — R0600 Dyspnea, unspecified: Secondary | ICD-10-CM | POA: Diagnosis not present

## 2020-04-02 MED ORDER — FUROSEMIDE 20 MG PO TABS: 20.0000 mg | ORAL_TABLET | Freq: Every day | ORAL | 3 refills | Status: DC

## 2020-04-02 NOTE — Progress Notes (Signed)
Office Visit    Patient Name: Daniel Luna Date of Encounter: 04/02/2020  Primary Care Provider:  Idelle Crouch, MD Primary Cardiologist:  Ida Rogue, MD Electrophysiologist:  None   Chief Complaint    Daniel Luna is a 78 y.o. male with a hx of CAD (2013 stentx3 : prox-mid ALD, mid RCA, distal RCA), HTN, HLD, HFrEF presents today for shortness of breath.    Past Medical History    Past Medical History:  Diagnosis Date  . Arthritis   . CHF (congestive heart failure) ([place])   . Coronary artery disease    CAD s/p PCI to LAD and RCA 01/2012   . Cyst of brain 1983  . Diabetes mellitus without complication ([place])   . Dysrhythmia   . Heart failure ([place])   . Hyperlipidemia   . Hypertension   . Ischemic cardiomyopathy    LVEF 123XX123 % (systolic and diastolic, NYHA I-II symptoms)   . Macular degeneration   . Shortness of breath dyspnea    Past Surgical History:  Procedure Laterality Date  . APPENDECTOMY  1997  . CARDIAC CATHETERIZATION    . COLONOSCOPY  2005   4 polyps  . crainectomy    . crainoplasty    . CYST EXCISION  08-06-13   back  . cyst removal brain  1983   benign dermatoid cyst  . EYE SURGERY Bilateral 2008, 2012  . stents  2013   3 placed  . TOTAL KNEE ARTHROPLASTY Right 05/21/2015   Procedure: TOTAL KNEE ARTHROPLASTY;  Surgeon: Dereck Leep, MD;  Location: [facility] ORS;  Service: Orthopedics;  Laterality: Right;  . TOTAL KNEE ARTHROPLASTY Left 09/21/2016   Procedure: TOTAL KNEE ARTHROPLASTY;  Surgeon: Hessie Knows, MD;  Location: [facility] ORS;  Service: Orthopedics;  Laterality: Left;  . TOTAL KNEE REVISION Left 09/06/2017   Procedure: LEFT KNEE REVISION WITH POLY EXCHANGE, OPEN SYNOVECTOMY;  Surgeon: Hessie Knows, MD;  Location: [facility] ORS;  Service: Orthopedics;  Laterality: Left;    Allergies  Allergies  Allergen Reactions  . Iodinated Diagnostic Agents Hives    History of Present Illness    Daniel Luna is a 79 y.o. male with a hx of CAD (2013  stentx3 : prox-mid ALD, mid RCA, distal RCA), HTN, HLD, HFrEF/ICM, bifasicular heart block (RBBB, LAFB), remote tobacco use, DM2. He was last seen 09/03/19 by Dr. Rockey Situ.  His stents were placed in 2013 at [facility] (prox-mid LAD, mid RCA, distal RCA). He had a stress test 08/2015 with no ischemic, LVEF 35%, and PVCs noted. Echocardiogram 2016 with LVEF 30-35%. At previous office visits he has declined transitioning Lisinopril to [place], ICD workup, and repeat echocardiogram.   Labs 02/19/20 via Care Everywhere:  Total cholesterol 159, triglycerides 156, HDL 35.3, LDL 93  A1c 6.8  K 4.9, creatinine 1.4, GFR 49, AST 25, ALT 18  Hb 13.6  He called the office 03/31/20 noting worsening shortness of breath since the last office visit. He is not tolerating >10 minutes of walking. Noted orthopnea.   Worsening shortness of breath over the last few days to week with minimal exertion. Tells me sometimes with exertion and shortness of breath he will intermittently have chest tightness, but not every time. Reports some coughing and wheezing. Tells me when he sleeps he is having shortness of breath consistent with orthopnea. Reports no PND. Reports no lower extremity edema. Notes his abdomen is larger than previously but he attributes this to weight gain.  Weighed at  home a few days ago and weighed 238lbs. Attributes some of his weight gain to not being able to participate in exercise at the [facility]. Does still walk at the mall or the track for exercise. Recently downloaded a heart-health app on his phone and has been trying to get 8,000-9,000 steps per day though this is made difficult by his dyspnea.   Tells me he drinks water and soda throughout the day. He drinks diet pepsi and diet sprite. Drinks 2-3 cans of soda per day and 1-2 four oz glasses of water.   EKGs/Labs/Other Studies Reviewed:   The following studies were reviewed today:  EKG:  EKG is ordered today.  The ekg ordered today demonstrates SR 89 bpm  with RBBB and LAFB. LVH with QRS widening and repolarization abnormality. Stable TWI aVR, aVL, V1, V2 (septal). Stable compared to previous.   Recent Labs: No results found for requested labs within last 8760 hours.  Recent Lipid Panel No results found for: CHOL, TRIG, HDL, CHOLHDL, VLDL, LDLCALC, LDLDIRECT  Home Medications   Current Meds  Medication Sig  . carvedilol (COREG) 12.5 MG tablet Take 1 tablet (12.5 mg total) by mouth 2 (two) times daily.  . furosemide (LASIX) 20 MG tablet Take 1 tablet (20 mg total) by mouth daily.  Marland Kitchen lisinopril (PRINIVIL,ZESTRIL) 40 MG tablet Take 1 tablet (40 mg total) by mouth daily.  . metFORMIN (GLUCOPHAGE) 1000 MG tablet Take 1,000 mg by mouth 2 (two) times daily with a meal.  . potassium chloride SA (KLOR-CON) 20 MEQ tablet Take 1 tablet (20 mEq total) by mouth daily.  . sertraline (ZOLOFT) 50 MG tablet Take 50 mg by mouth daily.  . simvastatin (ZOCOR) 80 MG tablet Take 1 tablet (80 mg total) by mouth at bedtime.  . [DISCONTINUED] furosemide (LASIX) 20 MG tablet TAKE 1 TABLET(20 MG) BY MOUTH DAILY    Review of Systems      Review of Systems  Constitution: Negative for chills, fever and malaise/fatigue.  Cardiovascular: Positive for chest pain ("tightness sometimes with shortness of breath"), dyspnea on exertion and orthopnea. Negative for leg swelling, near-syncope, palpitations and syncope.  Respiratory: Positive for cough, shortness of breath and wheezing. Negative for sputum production.   Gastrointestinal: Negative for nausea and vomiting.  Neurological: Negative for dizziness, light-headedness and weakness.   All other systems reviewed and are otherwise negative except as noted above.  Physical Exam    VS:  BP 130/80 (BP Location: Right Arm, Patient Position: Sitting, Cuff Size: Normal)   Pulse 89   Ht 6\' 3"  (1.905 m)   Wt 237 lb (107.5 kg)   SpO2 93%   BMI 29.62 kg/m  , BMI Body mass index is 29.62 kg/m. GEN: Well nourished,  overweight, well developed, in no acute distress. HEENT: normal. Neck: Supple, no JVD, carotid bruits, or masses. Cardiac: RRR, no murmurs, rubs, or gallops. No clubbing, cyanosis, edema.  Radials/PT 2+ and equal bilaterally.  Respiratory:  Respirations regular and unlabored. Bilateral upper lobes clear to auscultation. Bilateral lower lobes with diminished breath sounds, no noted rhonchi nor crackles.  GI: Abdomen is distended but soft, nontender with no pitting edema.  MS: No deformity or atrophy. Skin: Warm and dry, no rash. Scattered ecchymosis to bilateral upper extremities.  Neuro:  Strength and sensation are intact. Psych: Normal affect.  Accessory Clinical Findings    ECG personally reviewed by me today - SR 89 bpm with RBBB and LAFB with stable TWI V1, V2 (septal leads) - no  acute changes.  Assessment & Plan    1. CAD - s/p DESx3 in 2013. EKG today no acute ST/T wave changes. Anticipate much of his worsening dyspnea is due to volume overload. If dyspnea continues despite optimization of fluid volume status could consider ischemia evaluation.  Present GDMT includes aspirin, beta blocker, statin.   2. Acute on chronic HFrEF/ICM/DOE/Orthopnea - Echo 2016 with LVEF 30-35%, has declined repeat echo, medication changes, and ICD evaluation in the past. Reports worsening dyspnea over the last week with minimal exertion and orthopnea. No LE edema. Reds vest today 40%.   Increase Lasix to 40mg  daily x5 days, then return to 20mg  daily.   Increase Potassium to full tablet x5 days, then return to half tablet daily.  Echocardiogram for reassessment of LVEF.   Daily weight encouraged. Report gain of 3lb overnight or 5lb in 1 week.   Initiated discussion regarding Entresto. Will re-discuss after echocardiogram.   Current GDMT includes beta blocker, ACE, loop diuretic. Consider Entresto, MRA at follow up.   3. HLD, LDL goal < 70 - Lipid panel via [facility] Everywhere total cholesterol 159,  triglycerides 156, HDL 35.3, LDL 93. Has been on Simvastatin 80mg  for many years. Discuss addition of Zetia 10mg  daily at follow up for LDL goal <70.   Disposition: Increase Lasix and K for 5 days. Echocardiogram. Follow up in 2 week(s) with Dr. Rockey Situ or APP.   Loel Dubonnet, NP 04/02/2020, 12:24 PM

## 2020-04-02 NOTE — Patient Instructions (Addendum)
Medication Instructions:  Your physician has recommended you make the following change in your medication:   Increase Lasix to 40mg  (two tablets) once per day for five days, then resume previous dose.  Increase Potassium to a WHOLE tablet once per day for five days, then resume previous dose.  After five days, return to Lasix 20mg  (one tablet) daily and Potassium half-tablet daily.  *If you need a refill on your cardiac medications before your next appointment, please call your pharmacy*   Lab Work: Your physician recommends that you return for lab work in: 1 week   Please have BMET drawn at the [facility]. You do not need to be fasting for this appointment.   If you have labs (blood work) drawn today and your tests are completely normal, you will receive your results only by: Marland Kitchen MyChart Message (if you have MyChart) OR . A paper copy in the mail If you have any lab test that is abnormal or we need to change your treatment, we will call you to review the results.   Testing/Procedures: Your EKG today showed sinus rhythm with a right bundle branch block and left anterior fascicular block. This is a stable finding for you. It tells [place] two of the electrical pathways in the bottom of the heart move a bit slower than they used to. We will continue to monitor with EKGs. If you notice new dizziness, lightheadedness, almost passing out, or passing out - please report these symptoms.  Your physician has requested that you have an echocardiogram. Echocardiography is a painless test that uses sound waves to create images of your heart. It provides your doctor with information about the size and shape of your heart and how well your heart's chambers and valves are working. This procedure takes approximately one hour. There are no restrictions for this procedure.   Follow-Up: At [facility], you and your health needs are our priority.  As part of our continuing mission to provide you with  exceptional heart care, we have created designated Provider Care Teams.  These Care Teams include your primary Cardiologist (physician) and Advanced Practice Providers (APPs -  Physician Assistants and Nurse Practitioners) who all work together to provide you with the care you need, when you need it.  We recommend signing up for the patient portal called "MyChart".  Sign up information is provided on this After Visit Summary.  MyChart is used to connect with patients for Virtual Visits (Telemedicine).  Patients are able to view lab/test results, encounter notes, upcoming appointments, etc.  Non-urgent messages can be sent to your provider as well.   To learn more about what you can do with MyChart, go to [email or web address].    Your next appointment:  2 weeks.    Other Instructions   Weight daily and keep a log. Report weight gain of 3lb overnight or 5 lb in 1 week.  Recommend low sodium diet. Recommend reducing intake of Pepsi as it contains sodium.

## 2020-04-06 ENCOUNTER — Inpatient Hospital Stay
Admission: EM | Admit: 2020-04-06 | Discharge: 2020-04-10 | DRG: 280 | Payer: Medicare Other | Attending: Internal Medicine | Admitting: Internal Medicine

## 2020-04-06 ENCOUNTER — Emergency Department: Payer: Medicare Other

## 2020-04-06 ENCOUNTER — Other Ambulatory Visit: Payer: Self-pay

## 2020-04-06 DIAGNOSIS — Z79899 Other long term (current) drug therapy: Secondary | ICD-10-CM | POA: Diagnosis not present

## 2020-04-06 DIAGNOSIS — I10 Essential (primary) hypertension: Secondary | ICD-10-CM | POA: Diagnosis not present

## 2020-04-06 DIAGNOSIS — I5023 Acute on chronic systolic (congestive) heart failure: Secondary | ICD-10-CM | POA: Diagnosis present

## 2020-04-06 DIAGNOSIS — Z7289 Other problems related to lifestyle: Secondary | ICD-10-CM

## 2020-04-06 DIAGNOSIS — Z7984 Long term (current) use of oral hypoglycemic drugs: Secondary | ICD-10-CM | POA: Diagnosis not present

## 2020-04-06 DIAGNOSIS — I249 Acute ischemic heart disease, unspecified: Secondary | ICD-10-CM | POA: Diagnosis present

## 2020-04-06 DIAGNOSIS — E785 Hyperlipidemia, unspecified: Secondary | ICD-10-CM | POA: Diagnosis present

## 2020-04-06 DIAGNOSIS — Z87891 Personal history of nicotine dependence: Secondary | ICD-10-CM | POA: Diagnosis not present

## 2020-04-06 DIAGNOSIS — I130 Hypertensive heart and chronic kidney disease with heart failure and stage 1 through stage 4 chronic kidney disease, or unspecified chronic kidney disease: Secondary | ICD-10-CM | POA: Diagnosis present

## 2020-04-06 DIAGNOSIS — I214 Non-ST elevation (NSTEMI) myocardial infarction: Secondary | ICD-10-CM | POA: Diagnosis present

## 2020-04-06 DIAGNOSIS — I25119 Atherosclerotic heart disease of native coronary artery with unspecified angina pectoris: Secondary | ICD-10-CM

## 2020-04-06 DIAGNOSIS — H3530 Unspecified macular degeneration: Secondary | ICD-10-CM | POA: Diagnosis present

## 2020-04-06 DIAGNOSIS — I25110 Atherosclerotic heart disease of native coronary artery with unstable angina pectoris: Secondary | ICD-10-CM | POA: Diagnosis present

## 2020-04-06 DIAGNOSIS — Z91041 Radiographic dye allergy status: Secondary | ICD-10-CM

## 2020-04-06 DIAGNOSIS — Y838 Other surgical procedures as the cause of abnormal reaction of the patient, or of later complication, without mention of misadventure at the time of the procedure: Secondary | ICD-10-CM | POA: Diagnosis present

## 2020-04-06 DIAGNOSIS — E1122 Type 2 diabetes mellitus with diabetic chronic kidney disease: Secondary | ICD-10-CM | POA: Diagnosis present

## 2020-04-06 DIAGNOSIS — F419 Anxiety disorder, unspecified: Secondary | ICD-10-CM | POA: Diagnosis present

## 2020-04-06 DIAGNOSIS — T82855A Stenosis of coronary artery stent, initial encounter: Secondary | ICD-10-CM | POA: Diagnosis present

## 2020-04-06 DIAGNOSIS — Z96653 Presence of artificial knee joint, bilateral: Secondary | ICD-10-CM | POA: Diagnosis present

## 2020-04-06 DIAGNOSIS — I509 Heart failure, unspecified: Secondary | ICD-10-CM | POA: Diagnosis present

## 2020-04-06 DIAGNOSIS — I255 Ischemic cardiomyopathy: Secondary | ICD-10-CM | POA: Diagnosis present

## 2020-04-06 DIAGNOSIS — N1832 Chronic kidney disease, stage 3b: Secondary | ICD-10-CM | POA: Diagnosis present

## 2020-04-06 DIAGNOSIS — I2510 Atherosclerotic heart disease of native coronary artery without angina pectoris: Secondary | ICD-10-CM | POA: Diagnosis not present

## 2020-04-06 DIAGNOSIS — Z20822 Contact with and (suspected) exposure to covid-19: Secondary | ICD-10-CM | POA: Diagnosis present

## 2020-04-06 LAB — LIPID PANEL
Cholesterol: 127 mg/dL (ref 0–200)
HDL: 29 mg/dL — ABNORMAL LOW (ref >40)
LDL Cholesterol: 81 mg/dL (ref 0–99)
Total CHOL/HDL Ratio: 4.4 RATIO
Triglycerides: 83 mg/dL (ref <150)
VLDL: 17 mg/dL (ref 0–40)

## 2020-04-06 LAB — RESPIRATORY PANEL BY RT PCR (FLU A&B, COVID)
Influenza A by PCR: NEGATIVE
Influenza B by PCR: NEGATIVE
SARS Coronavirus 2 by RT PCR: NEGATIVE

## 2020-04-06 LAB — TROPONIN I (HIGH SENSITIVITY)
Troponin I (High Sensitivity): 702 ng/L (ref <18)
Troponin I (High Sensitivity): 714 ng/L (ref <18)
Troponin I (High Sensitivity): 630 ng/L (ref <18)
Troponin I (High Sensitivity): 503 ng/L (ref <18)

## 2020-04-06 LAB — BASIC METABOLIC PANEL
Anion gap: 9 (ref 5–15)
BUN: 44 mg/dL — ABNORMAL HIGH (ref 8–23)
CO2: 24 mmol/L (ref 22–32)
Calcium: 9.3 mg/dL (ref 8.9–10.3)
Chloride: 105 mmol/L (ref 98–111)
Creatinine, Ser: 1.71 mg/dL — ABNORMAL HIGH (ref 0.61–1.24)
GFR calc Af Amer: 44 mL/min — ABNORMAL LOW (ref >60)
GFR calc non Af Amer: 38 mL/min — ABNORMAL LOW (ref >60)
Glucose, Bld: 180 mg/dL — ABNORMAL HIGH (ref 70–99)
Potassium: 5.3 mmol/L — ABNORMAL HIGH (ref 3.5–5.1)
Sodium: 138 mmol/L (ref 135–145)

## 2020-04-06 LAB — CBC
HCT: 33.5 % — ABNORMAL LOW (ref 39.0–52.0)
Hemoglobin: 11 g/dL — ABNORMAL LOW (ref 13.0–17.0)
MCH: 32.6 pg (ref 26.0–34.0)
MCHC: 32.8 g/dL (ref 30.0–36.0)
MCV: 99.4 fL (ref 80.0–100.0)
Platelets: 193 10*3/uL (ref 150–400)
RBC: 3.37 MIL/uL — ABNORMAL LOW (ref 4.22–5.81)
RDW: 13.2 % (ref 11.5–15.5)
WBC: 6.4 10*3/uL (ref 4.0–10.5)
nRBC: 0 % (ref 0.0–0.2)

## 2020-04-06 LAB — APTT: aPTT: 36 seconds (ref 24–36)

## 2020-04-06 LAB — GLUCOSE, CAPILLARY: Glucose-Capillary: 138 mg/dL — ABNORMAL HIGH (ref 70–99)

## 2020-04-06 LAB — PROTIME-INR
INR: 1.1 (ref 0.8–1.2)
Prothrombin Time: 14.5 seconds (ref 11.4–15.2)

## 2020-04-06 LAB — HEMOGLOBIN A1C
Hgb A1c MFr Bld: 6.6 % — ABNORMAL HIGH (ref 4.8–5.6)
Mean Plasma Glucose: 142.72 mg/dL

## 2020-04-06 LAB — BRAIN NATRIURETIC PEPTIDE: B Natriuretic Peptide: 2725 pg/mL — ABNORMAL HIGH (ref 0.0–100.0)

## 2020-04-06 MED ORDER — INSULIN ASPART 100 UNIT/ML ~~LOC~~ SOLN
0.0000 [IU] | Freq: Three times a day (TID) | SUBCUTANEOUS | Status: DC
  Administered 2020-04-07: 3 [IU] via SUBCUTANEOUS
  Administered 2020-04-07 – 2020-04-10 (×6): 2 [IU] via SUBCUTANEOUS
  Administered 2020-04-10: 12:00:00 1 [IU] via SUBCUTANEOUS
  Filled 2020-04-06 (×8): qty 1

## 2020-04-06 MED ORDER — HEPARIN BOLUS VIA INFUSION
4000.0000 [IU] | Freq: Once | INTRAVENOUS | Status: AC
  Administered 2020-04-06: 4000 [IU] via INTRAVENOUS
  Filled 2020-04-06: qty 4000

## 2020-04-06 MED ORDER — LISINOPRIL 20 MG PO TABS
40.0000 mg | ORAL_TABLET | Freq: Every day | ORAL | Status: DC
  Administered 2020-04-07: 09:00:00 40 mg via ORAL
  Filled 2020-04-06: qty 2

## 2020-04-06 MED ORDER — ONDANSETRON HCL 4 MG/2ML IJ SOLN: 4.0000 mg | Freq: Four times a day (QID) | INTRAMUSCULAR | Status: DC | PRN

## 2020-04-06 MED ORDER — POTASSIUM CHLORIDE CRYS ER 20 MEQ PO TBCR
20.0000 meq | EXTENDED_RELEASE_TABLET | Freq: Every day | ORAL | Status: DC
  Administered 2020-04-07 – 2020-04-10 (×4): 20 meq via ORAL
  Filled 2020-04-06 (×4): qty 1

## 2020-04-06 MED ORDER — FUROSEMIDE 10 MG/ML IJ SOLN: 60.0000 mg | Freq: Once | INTRAMUSCULAR | Status: DC

## 2020-04-06 MED ORDER — ASPIRIN 81 MG PO CHEW
324.0000 mg | CHEWABLE_TABLET | Freq: Once | ORAL | Status: AC
  Administered 2020-04-06: 324 mg via ORAL
  Filled 2020-04-06: qty 4

## 2020-04-06 MED ORDER — HEPARIN (PORCINE) 25000 UT/250ML-% IV SOLN
1600.0000 [IU]/h | INTRAVENOUS | Status: DC
  Administered 2020-04-06: 18:00:00 1600 [IU]/h via INTRAVENOUS
  Filled 2020-04-06 (×2): qty 250

## 2020-04-06 MED ORDER — NITROGLYCERIN 0.4 MG SL SUBL: 0.4000 mg | SUBLINGUAL_TABLET | SUBLINGUAL | Status: DC | PRN

## 2020-04-06 MED ORDER — CARVEDILOL 12.5 MG PO TABS
12.5000 mg | ORAL_TABLET | Freq: Two times a day (BID) | ORAL | Status: DC
  Administered 2020-04-06 – 2020-04-10 (×8): 12.5 mg via ORAL
  Filled 2020-04-06 (×8): qty 1

## 2020-04-06 MED ORDER — ASPIRIN EC 81 MG PO TBEC
81.0000 mg | DELAYED_RELEASE_TABLET | Freq: Every day | ORAL | Status: DC
  Administered 2020-04-07 – 2020-04-10 (×4): 81 mg via ORAL
  Filled 2020-04-06 (×4): qty 1

## 2020-04-06 MED ORDER — FUROSEMIDE 20 MG PO TABS: 20.0000 mg | ORAL_TABLET | Freq: Every day | ORAL | Status: DC

## 2020-04-06 MED ORDER — SERTRALINE HCL 50 MG PO TABS
50.0000 mg | ORAL_TABLET | Freq: Every day | ORAL | Status: DC
  Administered 2020-04-07 – 2020-04-10 (×4): 50 mg via ORAL
  Filled 2020-04-06 (×4): qty 1

## 2020-04-06 MED ORDER — ASPIRIN 81 MG PO CHEW
324.0000 mg | CHEWABLE_TABLET | ORAL | Status: AC
  Administered 2020-04-06: 22:00:00 324 mg via ORAL
  Filled 2020-04-06: qty 4

## 2020-04-06 MED ORDER — INSULIN ASPART 100 UNIT/ML ~~LOC~~ SOLN
0.0000 [IU] | Freq: Every day | SUBCUTANEOUS | Status: DC
  Filled 2020-04-06 (×2): qty 1

## 2020-04-06 MED ORDER — ASPIRIN 300 MG RE SUPP: 300.0000 mg | RECTAL | Status: AC

## 2020-04-06 MED ORDER — FUROSEMIDE 10 MG/ML IJ SOLN
40.0000 mg | Freq: Once | INTRAMUSCULAR | Status: AC
  Administered 2020-04-06: 40 mg via INTRAVENOUS
  Filled 2020-04-06: qty 4

## 2020-04-06 MED ORDER — ATORVASTATIN CALCIUM 20 MG PO TABS
40.0000 mg | ORAL_TABLET | Freq: Every day | ORAL | Status: DC
  Administered 2020-04-06 – 2020-04-08 (×3): 40 mg via ORAL
  Filled 2020-04-06 (×3): qty 2

## 2020-04-06 MED ORDER — ACETAMINOPHEN 325 MG PO TABS: 650.0000 mg | ORAL_TABLET | ORAL | Status: DC | PRN

## 2020-04-06 NOTE — ED Triage Notes (Signed)
Pt c/o SHOB with wheezing x1 week - Pt went to [facility]gent care and they advised him to come to ED for cardiac workup - Pt had cardiologist appt Wed and scheduled echo for next week

## 2020-04-06 NOTE — ED Provider Notes (Signed)
[facility] Emergency Department Provider Note       Time seen: ----------------------------------------- 5:12 PM on 04/06/2020 -----------------------------------------   I have reviewed the triage vital signs and the nursing notes.  HISTORY   Chief Complaint Shortness of Breath and Chest Pain    HPI Daniel Luna is a 79 y.o. male with a history of arthritis, CHF, coronary artery disease, diabetes, heart failure, hyperlipidemia, hypertension who presents to the ED for shortness of breath with wheezing over the last week.  Patient went to [facility] urgent care and they advised him to come to the ER for a full cardiac work-up.  He is scheduled for an echocardiogram next week.  He denies any pain.  Past Medical History:  Diagnosis Date  . Arthritis   . CHF (congestive heart failure) ([place])   . Coronary artery disease    CAD s/p PCI to LAD and RCA 01/2012   . Cyst of brain 1983  . Diabetes mellitus without complication ([place])   . Dysrhythmia   . Heart failure ([place])   . Hyperlipidemia   . Hypertension   . Ischemic cardiomyopathy    LVEF 123XX123 % (systolic and diastolic, NYHA I-II symptoms)   . Macular degeneration   . Shortness of breath dyspnea     Patient Active Problem List   Diagnosis Date Noted  . 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 Morbid obesity ([place]) 01/18/2017  . Shortness of breath 01/18/2017  . Primary localized osteoarthritis of left knee 09/21/2016  . Coronary artery disease of native artery of native heart with stable angina pectoris ([place]) 07/12/2016  . Congestive heart failure ([place]) 07/12/2016  . History of coronary artery stent placement 07/12/2016  . Hyperlipidemia 07/12/2016  . Cardiomyopathy, ischemic 07/12/2016  . 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  . Total knee replacement status 05/21/2015  . Encounter for screening colonoscopy 12/25/2013  . Sebaceous cyst 08/15/2013  . Infected sebaceous cyst of skin 05/23/2013     Past Surgical History:  Procedure Laterality Date  . APPENDECTOMY  1997  . CARDIAC CATHETERIZATION    . COLONOSCOPY  2005   4 polyps  . crainectomy    . crainoplasty    . CYST EXCISION  08-06-13   back  . cyst removal brain  1983   benign dermatoid cyst  . EYE SURGERY Bilateral 2008, 2012  . stents  2013   3 placed  . TOTAL KNEE ARTHROPLASTY Right 05/21/2015   Procedure: TOTAL KNEE ARTHROPLASTY;  Surgeon: Dereck Leep, MD;  Location: [facility] ORS;  Service: Orthopedics;  Laterality: Right;  . TOTAL KNEE ARTHROPLASTY Left 09/21/2016   Procedure: TOTAL KNEE ARTHROPLASTY;  Surgeon: Hessie Knows, MD;  Location: [facility] ORS;  Service: Orthopedics;  Laterality: Left;  . TOTAL KNEE REVISION Left 09/06/2017   Procedure: LEFT KNEE REVISION WITH POLY EXCHANGE, OPEN SYNOVECTOMY;  Surgeon: Hessie Knows, MD;  Location: [facility] ORS;  Service: Orthopedics;  Laterality: Left;    Allergies Iodinated diagnostic agents  Social History Social History   Tobacco Use  . Smoking status: Former Smoker    Packs/day: 2.00    Years: 27.00    Pack years: 54.00    Types: Cigarettes    Quit date: 12/21/1988    Years since quitting: 31.3  . Smokeless tobacco: Never Used  Substance Use Topics  . Alcohol use: Yes    Comment: couple beer a week  . Drug use: No    Review of Systems Constitutional: Negative for fever. Cardiovascular:  Negative for chest pain. Respiratory: Positive for shortness of breath Gastrointestinal: Negative for abdominal pain, vomiting and diarrhea. Musculoskeletal: Negative for back pain. Skin: Negative for rash. Neurological: Negative for headaches, focal weakness or numbness.  All systems negative/normal/unremarkable except as stated in the HPI  ____________________________________________   PHYSICAL EXAM:  VITAL SIGNS: ED Triage Vitals  Enc Vitals Group     BP 04/06/20 1602 124/86     Pulse Rate 04/06/20 1602 88     Resp 04/06/20 1602 18     Temp 04/06/20 1602 97.8 F  (36.6 C)     Temp Source 04/06/20 1602 Oral     SpO2 04/06/20 1602 96 %     Weight 04/06/20 1605 236 lb (107 kg)     Height 04/06/20 1605 6\' 3"  (1.905 m)     Head Circumference --      Peak Flow --      Pain Score 04/06/20 1604 0     Pain Loc --      Pain Edu? --      Excl. in [place]? --     Constitutional: Alert and oriented. Well appearing and in no distress. Eyes: Conjunctivae are normal. Normal extraocular movements. ENT      Head: Normocephalic and atraumatic.      Nose: No congestion/rhinnorhea.      Mouth/Throat: Mucous membranes are moist.      Neck: No stridor. Cardiovascular: Normal rate, regular rhythm. No murmurs, rubs, or gallops. Respiratory: Normal respiratory effort without tachypnea nor retractions. Breath sounds are clear and equal bilaterally. No wheezes/rales/rhonchi. Gastrointestinal: Soft and nontender. Normal bowel sounds Musculoskeletal: Nontender with normal range of motion in extremities. No lower extremity tenderness nor edema. Neurologic:  Normal speech and language. No gross focal neurologic deficits are appreciated.  Skin:  Skin is warm, dry and intact. No rash noted. Psychiatric: Mood and affect are normal. Speech and behavior are normal.  ____________________________________________  EKG: Interpreted by me.  Normal sinus rhythm with rate of 84 bpm, left axis deviation, right bundle branch block, LVH with repolarization abnormality, long QT  ____________________________________________  ED COURSE:  As part of my medical decision making, I reviewed the following data within the [place] History obtained from family if available, nursing notes, old chart and ekg, as well as notes from prior ED visits. Patient presented for shortness of breath, we will assess with labs and imaging as indicated at this time.   Procedures  [name] was evaluated in Emergency Department on 04/06/2020 for the symptoms described in the history of  present illness. He was evaluated in the context of the global COVID-19 pandemic, which necessitated consideration that the patient might be at risk for infection with the SARS-CoV-2 virus that causes COVID-19. Institutional protocols and algorithms that pertain to the evaluation of patients at risk for COVID-19 are in a state of rapid change based on information released by regulatory bodies including the CDC and federal and state organizations. These policies and algorithms were followed during the patient's care in the ED.  ____________________________________________   LABS (pertinent positives/negatives)  Labs Reviewed  BASIC METABOLIC PANEL - Abnormal; Notable for the following components:      Result Value   Potassium 5.3 (*)    Glucose, Bld 180 (*)    BUN 44 (*)    Creatinine, Ser 1.71 (*)    GFR calc non Af Amer 38 (*)    GFR calc Af Amer 44 (*)  All other components within normal limits  CBC - Abnormal; Notable for the following components:   RBC 3.37 (*)    Hemoglobin 11.0 (*)    HCT 33.5 (*)    All other components within normal limits  TROPONIN I (HIGH SENSITIVITY) - Abnormal; Notable for the following components:   Troponin I (High Sensitivity) 702 (*)    All other components within normal limits   CRITICAL CARE Performed by: Laurence Aly   Total critical care time: 15 minutes  Critical care time was exclusive of separately billable procedures and treating other patients.  Critical care was necessary to treat or prevent imminent or life-threatening deterioration.  Critical care was time spent personally by me on the following activities: development of treatment plan with patient and/or surrogate as well as nursing, discussions with consultants, evaluation of patient's response to treatment, examination of patient, obtaining history from patient or surrogate, ordering and performing treatments and interventions, ordering and review of laboratory studies,  ordering and review of radiographic studies, pulse oximetry and re-evaluation of patient's condition.  RADIOLOGY Images were viewed by me CXR IMPRESSION: 1. Constellation of findings most consistent with congestive heart failure.  ____________________________________________   DIFFERENTIAL DIAGNOSIS   CHF, COPD, pneumonia, COVID-19, MI, PE  FINAL ASSESSMENT AND PLAN  Elevated troponin, congestive heart failure   Plan: The patient had presented for likely CHF exacerbation. Patient's labs revealed a markedly elevated troponin of 702. Patient's imaging revealed vascular congestion and signs of CHF.  His symptoms are concerning for blockage and I think he will need repeat heart catheterization given that he had the symptoms less than 10 years ago with 3 stents placed.  I will give him aspirin and heparin.  I will discuss with the hospitalist and cardiology for admission.   Laurence Aly, MD    Note: This note was generated in part or whole with voice recognition software. Voice recognition is usually quite accurate but there are transcription errors that can and very often do occur. I apologize for any typographical errors that were not detected and corrected.     Earleen Newport, MD 04/06/20 [phone]

## 2020-04-06 NOTE — ED Notes (Signed)
ED Provider at bedside. 

## 2020-04-06 NOTE — ED Notes (Signed)
Provider at bedside

## 2020-04-06 NOTE — ED Notes (Signed)
Pt reports difficulty breathing starting Friday when he was walking at the mall.

## 2020-04-06 NOTE — Consult Note (Addendum)
ANTICOAGULATION CONSULT NOTE - Initial Consult  Pharmacy Consult for Heparin Infusion Indication: chest pain/ACS  Allergies  Allergen Reactions  . Iodinated Diagnostic Agents Hives    Patient Measurements: Height: 6\' 3"  (190.5 cm) Weight: 107 kg (236 lb) IBW/kg (Calculated) : 84.5 Heparin Dosing Weight: 106.1 kg  Vital Signs: Temp: 97.8 F (36.6 C) (04/25 1602) Temp Source: Oral (04/25 1602) BP: 143/92 (04/25 1730) Pulse Rate: 88 (04/25 1730)  Labs: Recent Labs    04/06/20 1617 04/06/20 1721  HGB 11.0*  --   HCT 33.5*  --   PLT 193  --   APTT  --  36  LABPROT  --  14.5  INR  --  1.1  CREATININE 1.71*  --   TROPONINIHS 702*  --     Estimated Creatinine Clearance: 48.6 mL/min (A) (by C-G formula based on SCr of 1.71 mg/dL (H)).   Medical History: Past Medical History:  Diagnosis Date  . Arthritis   . CHF (congestive heart failure) ([place])   . Coronary artery disease    CAD s/p PCI to LAD and RCA 01/2012   . Cyst of brain 1983  . Diabetes mellitus without complication ([place])   . Dysrhythmia   . Heart failure ([place])   . Hyperlipidemia   . Hypertension   . Ischemic cardiomyopathy    LVEF 123XX123 % (systolic and diastolic, NYHA I-II symptoms)   . Macular degeneration   . Shortness of breath dyspnea     Medications:  (Not in a hospital admission)  Scheduled:  . furosemide  40 mg Intravenous Once  . insulin aspart  0-5 Units Subcutaneous QHS  . [START ON 04/07/2020] insulin aspart  0-9 Units Subcutaneous TID WC   Infusions:  . heparin 1,600 Units/hr (04/06/20 1750)   PRN:  Anti-infectives (From admission, onward)   None      Assessment: Pharmacy has been consulted to initiate Heparin Infusion in 79yo male with known history of CAD and CHF presenting to the ED with increased shortness of breath with wheezing. No prior history of PTA anticoagulant use. Baseline labs: aPTT 36sec, INR 1.1, Hgb 11.0, PLTs 193. Will initiate Heparin Drip immediately.  Goal of  Therapy:  Heparin level 0.3-0.7 units/ml Monitor platelets by anticoagulation protocol: Yes   Plan:  Give 4000 units bolus x 1 Start heparin infusion at 1600 units/hr Check anti-Xa level in 8 hours and daily while on heparin Continue to monitor H&H and platelets  Erza Mothershead A Jaris Kohles 04/06/2020,6:29 PM

## 2020-04-06 NOTE — Progress Notes (Signed)
[facility] [facility] [place] NAME: Daniel Luna    MR#:  [id number]  DATE OF BIRTH:  [birth year]  DATE OF ADMISSION:  04/06/2020  PRIMARY CARE PHYSICIAN: Idelle Crouch, MD   REQUESTING/REFERRING PHYSICIAN: Dr. Jimmye Norman  Patient coming from : home, independent with ADLs lives with wife  CHIEF COMPLAINT:  increasing shortness of breath and chest tightness since last week  HISTORY OF PRESENT ILLNESS:  Daniel Luna  is a 79 y.o. male with a known history of coronary artery disease status post tree stands at [facility] in 2013, hypertension, hyperlipidemia, chronic systolic congestive heart failure with LV ejection fraction of about 35%, history of life vesicular heart block and removed tobacco abuse, type II diabetes comes to the emergency room with increasing shortness of breath, increased fatigue ability with regular walking and chest congestion with tightness on and off since last week.  She was seen by cardiology nurse practitioner on 21st April. Patient's Lasix was increased to 40 mg daily for five days. Patient's baseline weight is 229 pounds. In the office he was found to have weight of 238 pounds. Today patient reports to 35 pounds. He does report increase in output with Lasix however does not seem to have significant improvement in his symptoms ED course: in the ER patient is hemodynamically stable. Sats are 96% on room air. Chest x-ray shows findings suggestive of pulmonary vascular congestion/CHF. His troponin was elevated 2708. Given his symptoms of increasing shortness of breath with weight gain and abnormal labs patient is being admitted for acute on chronic systolic congestive heart failure and non-STEMI/demand ischemia.  ER physician spoke with Dr. Judd Lien [facility] cardiology recommends IV heparin drip and possible cath tomorrow. PAST MEDICAL HISTORY:   Past Medical History:  Diagnosis Date  . Arthritis   . CHF (congestive heart failure) ([place])   .  Coronary artery disease    CAD s/p PCI to LAD and RCA 01/2012   . Cyst of brain 1983  . Diabetes mellitus without complication ([place])   . Dysrhythmia   . Heart failure ([place])   . Hyperlipidemia   . Hypertension   . Ischemic cardiomyopathy    LVEF 123XX123 % (systolic and diastolic, NYHA I-II symptoms)   . Macular degeneration   . Shortness of breath dyspnea     PAST SURGICAL HISTOIRY:   Past Surgical History:  Procedure Laterality Date  . APPENDECTOMY  1997  . CARDIAC CATHETERIZATION    . COLONOSCOPY  2005   4 polyps  . crainectomy    . crainoplasty    . CYST EXCISION  08-06-13   back  . cyst removal brain  1983   benign dermatoid cyst  . EYE SURGERY Bilateral 2008, 2012  . stents  2013   3 placed  . TOTAL KNEE ARTHROPLASTY Right 05/21/2015   Procedure: TOTAL KNEE ARTHROPLASTY;  Surgeon: Dereck Leep, MD;  Location: [facility] ORS;  Service: Orthopedics;  Laterality: Right;  . TOTAL KNEE ARTHROPLASTY Left 09/21/2016   Procedure: TOTAL KNEE ARTHROPLASTY;  Surgeon: Hessie Knows, MD;  Location: [facility] ORS;  Service: Orthopedics;  Laterality: Left;  . TOTAL KNEE REVISION Left 09/06/2017   Procedure: LEFT KNEE REVISION WITH POLY EXCHANGE, OPEN SYNOVECTOMY;  Surgeon: Hessie Knows, MD;  Location: [facility] ORS;  Service: Orthopedics;  Laterality: Left;    SOCIAL HISTORY:   Social History   Tobacco Use  . Smoking status: Former Smoker    Packs/day: 2.00    Years: 27.00  Pack years: 54.00    Types: Cigarettes    Quit date: 12/21/1988    Years since quitting: 31.3  . Smokeless tobacco: Never Used  Substance Use Topics  . Alcohol use: Yes    Comment: couple beer a week    FAMILY HISTORY:   Family History  Problem Relation Age of Onset  . Throat cancer Mother     DRUG ALLERGIES:   Allergies  Allergen Reactions  . Iodinated Diagnostic Agents Hives    REVIEW OF SYSTEMS:  Review of Systems  Constitutional: Negative for chills, fever and weight loss.  HENT: Negative for ear  discharge, ear pain and nosebleeds.   Eyes: Negative for blurred vision, pain and discharge.  Respiratory: Positive for shortness of breath. Negative for sputum production, wheezing and stridor.   Cardiovascular: Positive for chest pain, orthopnea and PND. Negative for palpitations.  Gastrointestinal: Negative for abdominal pain, diarrhea, nausea and vomiting.  Genitourinary: Negative for frequency and urgency.  Musculoskeletal: Negative for back pain and joint pain.  Neurological: Negative for sensory change, speech change, focal weakness and weakness.  Psychiatric/Behavioral: Negative for depression and hallucinations. The patient is not nervous/anxious.      MEDICATIONS AT HOME:   Prior to Admission medications   Medication Sig Start Date End Date Taking? Authorizing Provider  carvedilol (COREG) 12.5 MG tablet Take 1 tablet (12.5 mg total) by mouth 2 (two) times daily. 09/13/19 06/13/20 Yes Gollan, Kathlene November, MD  furosemide (LASIX) 20 MG tablet Take 1 tablet (20 mg total) by mouth daily. 04/02/20  Yes Loel Dubonnet, NP  lisinopril (PRINIVIL,ZESTRIL) 40 MG tablet Take 1 tablet (40 mg total) by mouth daily. 07/12/16  Yes Gollan, Kathlene November, MD  meloxicam (MOBIC) 15 MG tablet Take 15 mg by mouth at bedtime.  03/06/20  Yes [provider]  metFORMIN (GLUCOPHAGE) 1000 MG tablet Take 1,000 mg by mouth 2 (two) times daily with a meal.   Yes [provider]  potassium chloride SA (KLOR-CON) 20 MEQ tablet Take 1 tablet (20 mEq total) by mouth daily. 02/25/20  Yes Minna Merritts, MD  sertraline (ZOLOFT) 50 MG tablet Take 50 mg by mouth daily. 08/17/19 08/16/20 Yes [provider]  simvastatin (ZOCOR) 80 MG tablet Take 1 tablet (80 mg total) by mouth at bedtime. 07/12/16  Yes Gollan, Kathlene November, MD      VITAL SIGNS:  Blood pressure (!) 143/92, pulse 88, temperature 97.8 F (36.6 C), temperature source Oral, resp. rate (!) 21, height 6\' 3"  (1.905 m), weight 107 kg, SpO2 97  %.  PHYSICAL EXAMINATION:  GENERAL:  79 y.o.-year-old patient lying in the bed with no acute distress.  EYES: Pupils equal, round, reactive to light and accommodation. No scleral icterus.  HEENT: Head atraumatic, normocephalic. Oropharynx and nasopharynx clear.  NECK:  Supple, no jugular venous distention. No thyroid enlargement, no tenderness.  LUNGS: Normal breath sounds bilaterally, no wheezing, rales,rhonchi or crepitation. No use of accessory muscles of respiration.  CARDIOVASCULAR: S1, S2 normal. No murmurs, rubs, or gallops.  ABDOMEN: Soft, nontender, nondistended. Bowel sounds present. No organomegaly or mass.  EXTREMITIES: No pedal edema, cyanosis, or clubbing.  NEUROLOGIC: Cranial nerves II through XII are intact. Muscle strength 5/5 in all extremities. Sensation intact. Gait not checked.  PSYCHIATRIC: The patient is alert and oriented x 3.  SKIN: No obvious rash, lesion, or ulcer.   LABORATORY PANEL:   CBC Recent Labs  Lab 04/06/20 1617  WBC 6.4  HGB 11.0*  HCT 33.5*  PLT 193   ------------------------------------------------------------------------------------------------------------------  Chemistries  Recent Labs  Lab 04/06/20 1617  NA 138  K 5.3*  CL 105  CO2 24  GLUCOSE 180*  BUN 44*  CREATININE 1.71*  CALCIUM 9.3   ------------------------------------------------------------------------------------------------------------------  Cardiac Enzymes No results for input(s): TROPONINI in the last 168 hours. ------------------------------------------------------------------------------------------------------------------  RADIOLOGY:  DG Chest 2 View  Result Date: 04/06/2020 CLINICAL DATA:  Shortness of breath, dyspnea on exertion for 10 days, previous tobacco abuse EXAM: CHEST - 2 VIEW COMPARISON:  Report only 02/14/2020 FINDINGS: Frontal and lateral views of the chest demonstrate an enlarged cardiac silhouette. There is central vascular congestion with  patchy airspace disease in the right right upper lobe and right lower lobe costophrenic angle. There is diffuse interstitial prominence. Small bilateral pleural effusions are noted. No pneumothorax. No acute bony abnormalities. IMPRESSION: 1. Constellation of findings most consistent with congestive heart failure. Electronically Signed   By: Randa Ngo M.D.   On: 04/06/2020 16:54    EKG:  left ventricular repolarization, right bundle branch block, left axis deviation  IMPRESSION AND PLAN:   Daniel Luna  is a 79 y.o. male with a known history of coronary artery disease status post tree stands at [facility] in 2013, hypertension, hyperlipidemia, chronic systolic congestive heart failure with LV ejection fraction of about 35%, history of life vesicular heart block and removed tobacco abuse, type II diabetes comes to the emergency room with increasing shortness of breath, increased fatigue ability with regular walking and chest congestion with tightness on and off since last week.  1. Acute on chronic systolic congestive heart failure -on LV EF of 35%, increased weight gain of about 8 to 9 pounds, x-ray consistent with pulmonary vascular congestion/CHF -BNP pending -admit to progressive care unit -IV Lasix 40 mg times one in the ER -monitor input output -Reds vest reading today -continue further diuresis according to patient's symptoms and labs  2. Acute coronary syndrome/ non-STEMI of coronary artery disease with stents x3 in 2013 -patient has increasing shortness of breath with chest tightness and elevated troponin of 703 -cycle troponin -ED physician spoke with Dr. Johnsie Cancel -IV heparin drip, aspirin, beta blockers, statins and ACE inhibitor -possible cath tomorrow  3. Type II diabetes -hold metformin in anticipation for procedure plan tomorrow -sliding scale insulin  4. Hyperlipidemia continue statins  5. Hypertension - continue Coreg, lisinopril  6. DVT prophylaxis on full dose heparin  drip   Family Communication : Ivin Booty and wife in the ER Consults : [facility] cardiology Code Status : full code DVT prophylaxis : heparin drip  TOTAL TIME TAKING CARE OF THIS PATIENT: *55* minutes.    Fritzi Mandes M.D  [facility]     CC: Primary care physician; Idelle Crouch, MD  Patient ID: Daniel Luna, male   DOB: November 09, 1941, 79 y.o.   MRN: [id number]

## 2020-04-07 ENCOUNTER — Inpatient Hospital Stay (HOSPITAL_COMMUNITY)
Admit: 2020-04-07 | Discharge: 2020-04-07 | Disposition: A | Discharge status: Home / Self Care | Payer: Medicare Other | Attending: Cardiovascular Disease | Admitting: Cardiovascular Disease

## 2020-04-07 ENCOUNTER — Other Ambulatory Visit: Payer: Self-pay

## 2020-04-07 DIAGNOSIS — I5023 Acute on chronic systolic (congestive) heart failure: Secondary | ICD-10-CM | POA: Diagnosis not present

## 2020-04-07 DIAGNOSIS — E785 Hyperlipidemia, unspecified: Secondary | ICD-10-CM | POA: Diagnosis not present

## 2020-04-07 DIAGNOSIS — I10 Essential (primary) hypertension: Secondary | ICD-10-CM

## 2020-04-07 DIAGNOSIS — I214 Non-ST elevation (NSTEMI) myocardial infarction: Secondary | ICD-10-CM | POA: Diagnosis not present

## 2020-04-07 DIAGNOSIS — I249 Acute ischemic heart disease, unspecified: Secondary | ICD-10-CM | POA: Diagnosis not present

## 2020-04-07 DIAGNOSIS — I25119 Atherosclerotic heart disease of native coronary artery with unspecified angina pectoris: Secondary | ICD-10-CM | POA: Diagnosis not present

## 2020-04-07 LAB — ECHOCARDIOGRAM COMPLETE
Height: 75 in
Weight: 3680 oz

## 2020-04-07 LAB — BASIC METABOLIC PANEL
Anion gap: 8 (ref 5–15)
BUN: 39 mg/dL — ABNORMAL HIGH (ref 8–23)
CO2: 26 mmol/L (ref 22–32)
Calcium: 9.3 mg/dL (ref 8.9–10.3)
Chloride: 106 mmol/L (ref 98–111)
Creatinine, Ser: 1.6 mg/dL — ABNORMAL HIGH (ref 0.61–1.24)
GFR calc Af Amer: 48 mL/min — ABNORMAL LOW (ref >60)
GFR calc non Af Amer: 41 mL/min — ABNORMAL LOW (ref >60)
Glucose, Bld: 145 mg/dL — ABNORMAL HIGH (ref 70–99)
Potassium: 4.2 mmol/L (ref 3.5–5.1)
Sodium: 140 mmol/L (ref 135–145)

## 2020-04-07 LAB — HEPARIN LEVEL (UNFRACTIONATED)
Heparin Unfractionated: 0.64 IU/mL (ref 0.30–0.70)
Heparin Unfractionated: 0.57 IU/mL (ref 0.30–0.70)

## 2020-04-07 LAB — GLUCOSE, CAPILLARY
Glucose-Capillary: 158 mg/dL — ABNORMAL HIGH (ref 70–99)
Glucose-Capillary: 203 mg/dL — ABNORMAL HIGH (ref 70–99)
Glucose-Capillary: 118 mg/dL — ABNORMAL HIGH (ref 70–99)
Glucose-Capillary: 135 mg/dL — ABNORMAL HIGH (ref 70–99)

## 2020-04-07 MED ORDER — SODIUM CHLORIDE 0.9% FLUSH
3.0000 mL | Freq: Two times a day (BID) | INTRAVENOUS | Status: DC
  Administered 2020-04-07 – 2020-04-08 (×2): 3 mL via INTRAVENOUS

## 2020-04-07 MED ORDER — SODIUM CHLORIDE 0.9% FLUSH
3.0000 mL | INTRAVENOUS | Status: DC | PRN
  Administered 2020-04-08: 3 mL via INTRAVENOUS

## 2020-04-07 MED ORDER — SODIUM CHLORIDE 0.9 % IV SOLN: 250.0000 mL | INTRAVENOUS | Status: DC | PRN

## 2020-04-07 MED ORDER — FUROSEMIDE 10 MG/ML IJ SOLN
40.0000 mg | Freq: Two times a day (BID) | INTRAMUSCULAR | Status: DC
  Administered 2020-04-07 – 2020-04-08 (×3): 40 mg via INTRAVENOUS
  Filled 2020-04-07 (×3): qty 4

## 2020-04-07 MED ORDER — HEPARIN (PORCINE) 25000 UT/250ML-% IV SOLN
1600.0000 [IU]/h | INTRAVENOUS | Status: DC
  Administered 2020-04-07: 1600 [IU]/h via INTRAVENOUS
  Filled 2020-04-07: qty 250

## 2020-04-07 MED ORDER — ASPIRIN 81 MG PO CHEW: 81.0000 mg | CHEWABLE_TABLET | ORAL | Status: AC

## 2020-04-07 MED ORDER — SODIUM CHLORIDE 0.9 % IV SOLN: INTRAVENOUS | Status: DC

## 2020-04-07 MED ORDER — LOSARTAN POTASSIUM 50 MG PO TABS
100.0000 mg | ORAL_TABLET | Freq: Every day | ORAL | Status: DC
  Administered 2020-04-07 – 2020-04-10 (×4): 100 mg via ORAL
  Filled 2020-04-07 (×4): qty 2

## 2020-04-07 NOTE — Progress Notes (Signed)
*  PRELIMINARY RESULTS* Echocardiogram 2D Echocardiogram has been performed.  Daniel Luna 04/07/2020, 1:22 PM

## 2020-04-07 NOTE — Progress Notes (Signed)
Patient ID: Daniel Luna, male   DOB: [birth year], 79 y.o.   MRN: [id number]  Reds Vest reading 44% today--cont current IV lasix

## 2020-04-07 NOTE — Consult Note (Signed)
ANTICOAGULATION CONSULT NOTE - Initial Consult  Pharmacy Consult for Heparin Infusion Indication: chest pain/ACS  Allergies  Allergen Reactions  . Iodinated Diagnostic Agents Hives    Patient Measurements: Height: 6\' 3"  (190.5 cm) Weight: 104.3 kg (230 lb) IBW/kg (Calculated) : 84.5 Heparin Dosing Weight: 106.1 kg  Vital Signs: Temp: 97.8 F (36.6 C) (04/26 1924) Temp Source: Oral (04/26 1924) BP: 139/89 (04/26 1924) Pulse Rate: 84 (04/26 1924)  Labs: Recent Labs    04/06/20 1617 04/06/20 1617 04/06/20 1721 04/06/20 2018 04/06/20 2237 04/07/20 0239 04/07/20 1048  HGB 11.0*  --   --   --   --   --   --   HCT 33.5*  --   --   --   --   --   --   PLT 193  --   --   --   --   --   --   APTT  --   --  36  --   --   --   --   LABPROT  --   --  14.5  --   --   --   --   INR  --   --  1.1  --   --   --   --   HEPARINUNFRC  --   --   --   --   --  0.64 0.57  CREATININE 1.71*  --   --   --   --  1.60*  --   TROPONINIHS 702*   < > 714* 630* 503*  --   --    < > = values in this interval not displayed.    Estimated Creatinine Clearance: 51.3 mL/min (A) (by C-G formula based on SCr of 1.6 mg/dL (H)).   Medical History: Past Medical History:  Diagnosis Date  . Arthritis   . CHF (congestive heart failure) ([place])   . Coronary artery disease    CAD s/p PCI to LAD and RCA 01/2012   . Cyst of brain 1983  . Diabetes mellitus without complication ([place])   . Dysrhythmia   . Heart failure ([place])   . Hyperlipidemia   . Hypertension   . Ischemic cardiomyopathy    LVEF 123XX123 % (systolic and diastolic, NYHA I-II symptoms)   . Macular degeneration   . Shortness of breath dyspnea     Medications:  Medications Prior to Admission  Medication Sig Dispense Refill Last Dose  . carvedilol (COREG) 12.5 MG tablet Take 1 tablet (12.5 mg total) by mouth 2 (two) times daily. 180 tablet 2 04/06/2020 at 0730  . furosemide (LASIX) 20 MG tablet Take 1 tablet (20 mg total) by mouth daily. 90  tablet 3 04/06/2020 at 0730  . lisinopril (PRINIVIL,ZESTRIL) 40 MG tablet Take 1 tablet (40 mg total) by mouth daily. 90 tablet 3 04/06/2020 at 0730  . meloxicam (MOBIC) 15 MG tablet Take 15 mg by mouth at bedtime.    04/05/2020 at Unknown time  . metFORMIN (GLUCOPHAGE) 1000 MG tablet Take 1,000 mg by mouth 2 (two) times daily with a meal.   04/06/2020 at 0730  . potassium chloride SA (KLOR-CON) 20 MEQ tablet Take 1 tablet (20 mEq total) by mouth daily. 30 tablet 6 04/06/2020 at 0730  . sertraline (ZOLOFT) 50 MG tablet Take 50 mg by mouth daily.   04/06/2020 at 0730  . simvastatin (ZOCOR) 80 MG tablet Take 1 tablet (80 mg total) by mouth at bedtime. 90 tablet 3 04/05/2020 at  Unknown time   Scheduled:  . [START ON 04/08/2020] aspirin  81 mg Oral Pre-Cath  . aspirin EC  81 mg Oral Daily  . atorvastatin  40 mg Oral Daily  . carvedilol  12.5 mg Oral BID  . furosemide  40 mg Intravenous BID  . insulin aspart  0-5 Units Subcutaneous QHS  . insulin aspart  0-9 Units Subcutaneous TID WC  . losartan  100 mg Oral Daily  . potassium chloride SA  20 mEq Oral Daily  . sertraline  50 mg Oral Daily  . sodium chloride flush  3 mL Intravenous Q12H   Infusions:  . sodium chloride    . [START ON 04/08/2020] sodium chloride    . heparin     PRN:  Anti-infectives (From admission, onward)   None      Assessment: Pharmacy has been consulted to initiate Heparin Infusion in 79yo male with known history of CAD and CHF presenting to the ED with increased shortness of breath with wheezing. No prior history of PTA anticoagulant use. Baseline labs: aPTT 36sec, INR 1.1, Hgb 11.0, PLTs 193. Will initiate Heparin Drip immediately.  Goal of Therapy:  Heparin level 0.3-0.7 units/ml Monitor platelets by anticoagulation protocol: Yes   Plan:  Heparin level therapeutic x 2 . Will continue current rate and will recheck heparin level and CBC with AM labs. Plan to cath tomorrow.   4/26:  Heparin gtt was d/c'd by accident @  2000.  Will resume heparin gtt at previous rate of 1600 units/hr.  Will check HL on 4/27 with AM labs.   [place] Pharmacist 04/07/2020,9:21 PM

## 2020-04-07 NOTE — H&P (View-Only) (Signed)
Cardiology Consultation:   Patient ID: MICAL TUMLINSON MRN: [id number]; DOB: 09/08/1941  Admit date: 04/06/2020 Date of Consult: 04/07/2020  Primary Care Provider: Idelle Crouch, MD Primary Cardiologist: Ida Rogue, MD  Primary Electrophysiologist:  None    Patient Profile:   LEMAR LYGA is a 79 y.o. male with a hx of congestive heart failure and coronary artery disease who is being seen today for the evaluation of heart failure exacerbation and chest tightness at the request of Dr. Posey Pronto.  History of Present Illness:   Mr. Zwicker is a 79 year old male with known history of coronary artery disease status post PCI and stent placement to the proximal LAD, mid RCA and distal RCA in 2013 at [facility], chronic systolic heart failure, type 2 diabetes, chronic kidney disease, essential hypertension and hyperlipidemia. Most recent ischemic evaluation in 2016 included a stress test that showed no ischemia with an EF of 35%.  Echocardiogram in 2016 showed an EF of 30 to 35%.  He was seen in our office last week by Laurann Montana for increased shortness of breath with minimal exertion.  He was also having wheezing with some weight gain.  He was noted to be volume overloaded and at that time the dose of furosemide was increased to 40 mg daily for 5 days.  He was scheduled for an echocardiogram which was to be done tomorrow in our office.  However, he did not respond very well to that.  He continued to have progressive exertional dyspnea and then started having substernal chest discomfort and tightness.  He went to urgent care yesterday with the symptoms and was advised to come to the ED for evaluation.  He was noted to have elevated troponin at 714 which remained stable.  BNP was 2700. He was started on IV furosemide 40 mg twice daily with excellent urine output.  He feels slightly better.  No chest pain at the present time.   Past Medical History:  Diagnosis Date  . Arthritis   . CHF (congestive heart  failure) ([place])   . Coronary artery disease    CAD s/p PCI to LAD and RCA 01/2012   . Cyst of brain 1983  . Diabetes mellitus without complication ([place])   . Dysrhythmia   . Heart failure ([place])   . Hyperlipidemia   . Hypertension   . Ischemic cardiomyopathy    LVEF 123XX123 % (systolic and diastolic, NYHA I-II symptoms)   . Macular degeneration   . Shortness of breath dyspnea     Past Surgical History:  Procedure Laterality Date  . APPENDECTOMY  1997  . CARDIAC CATHETERIZATION    . COLONOSCOPY  2005   4 polyps  . crainectomy    . crainoplasty    . CYST EXCISION  08-06-13   back  . cyst removal brain  1983   benign dermatoid cyst  . EYE SURGERY Bilateral 2008, 2012  . stents  2013   3 placed  . TOTAL KNEE ARTHROPLASTY Right 05/21/2015   Procedure: TOTAL KNEE ARTHROPLASTY;  Surgeon: Dereck Leep, MD;  Location: [facility] ORS;  Service: Orthopedics;  Laterality: Right;  . TOTAL KNEE ARTHROPLASTY Left 09/21/2016   Procedure: TOTAL KNEE ARTHROPLASTY;  Surgeon: Hessie Knows, MD;  Location: [facility] ORS;  Service: Orthopedics;  Laterality: Left;  . TOTAL KNEE REVISION Left 09/06/2017   Procedure: LEFT KNEE REVISION WITH POLY EXCHANGE, OPEN SYNOVECTOMY;  Surgeon: Hessie Knows, MD;  Location: [facility] ORS;  Service: Orthopedics;  Laterality: Left;  Home Medications:  Prior to Admission medications   Medication Sig Start Date End Date Taking? Authorizing Provider  carvedilol (COREG) 12.5 MG tablet Take 1 tablet (12.5 mg total) by mouth 2 (two) times daily. 09/13/19 06/13/20 Yes Gollan, Kathlene November, MD  furosemide (LASIX) 20 MG tablet Take 1 tablet (20 mg total) by mouth daily. 04/02/20  Yes Loel Dubonnet, NP  lisinopril (PRINIVIL,ZESTRIL) 40 MG tablet Take 1 tablet (40 mg total) by mouth daily. 07/12/16  Yes Gollan, Kathlene November, MD  meloxicam (MOBIC) 15 MG tablet Take 15 mg by mouth at bedtime.  03/06/20  Yes [provider]  metFORMIN (GLUCOPHAGE) 1000 MG tablet Take 1,000 mg by mouth 2 (two)  times daily with a meal.   Yes [provider]  potassium chloride SA (KLOR-CON) 20 MEQ tablet Take 1 tablet (20 mEq total) by mouth daily. 02/25/20  Yes Minna Merritts, MD  sertraline (ZOLOFT) 50 MG tablet Take 50 mg by mouth daily. 08/17/19 08/16/20 Yes [provider]  simvastatin (ZOCOR) 80 MG tablet Take 1 tablet (80 mg total) by mouth at bedtime. 07/12/16  Yes Minna Merritts, MD    Inpatient Medications: Scheduled Meds: . aspirin EC  81 mg Oral Daily  . atorvastatin  40 mg Oral Daily  . carvedilol  12.5 mg Oral BID  . furosemide  40 mg Intravenous BID  . insulin aspart  0-5 Units Subcutaneous QHS  . insulin aspart  0-9 Units Subcutaneous TID WC  . losartan  100 mg Oral Daily  . potassium chloride SA  20 mEq Oral Daily  . sertraline  50 mg Oral Daily   Continuous Infusions: . heparin 1,600 Units/hr (04/06/20 1750)   PRN Meds: acetaminophen, nitroGLYCERIN, ondansetron (ZOFRAN) IV  Allergies:    Allergies  Allergen Reactions  . Iodinated Diagnostic Agents Hives    Social History:   Social History   Socioeconomic History  . Marital status: Married    Spouse name: Not on file  . Number of children: Not on file  . Years of education: Not on file  . Highest education level: Not on file  Occupational History  . Not on file  Tobacco Use  . Smoking status: Former Smoker    Packs/day: 2.00    Years: 27.00    Pack years: 54.00    Types: Cigarettes    Quit date: 12/21/1988    Years since quitting: 31.3  . Smokeless tobacco: Never Used  Substance and Sexual Activity  . Alcohol use: Yes    Comment: couple beer a week  . Drug use: No  . Sexual activity: Not on file  Other Topics Concern  . Not on file  Social History Narrative  . Not on file   Social Determinants of Health   Financial Resource Strain:   . Difficulty of Paying Living Expenses:   Food Insecurity:   . Worried About Charity fundraiser in the Last Year:   . Arboriculturist in the  Last Year:   Transportation Needs:   . Film/video editor (Medical):   Marland Kitchen Lack of Transportation (Non-Medical):   Physical Activity:   . Days of Exercise per Week:   . Minutes of Exercise per Session:   Stress:   . Feeling of Stress :   Social Connections:   . Frequency of Communication with Friends and Family:   . Frequency of Social Gatherings with Friends and Family:   . Attends Religious Services:   .  Active Member of Clubs or Organizations:   . Attends Archivist Meetings:   Marland Kitchen Marital Status:   Intimate Partner Violence:   . Fear of Current or Ex-Partner:   . Emotionally Abused:   Marland Kitchen Physically Abused:   . Sexually Abused:     Family History:    Family History  Problem Relation Age of Onset  . Throat cancer Mother      ROS:  Please see the history of present illness.   All other ROS reviewed and negative.     Physical Exam/Data:   Vitals:   04/07/20 0058 04/07/20 0201 04/07/20 0719 04/07/20 0831  BP:  125/89 (!) 155/107 (!) 148/97  Pulse:  82 92 96  Resp:  20 19   Temp:  97.9 F (36.6 C) 97.7 F (36.5 C)   TempSrc:  Oral Oral   SpO2:  98% 97% 96%  Weight: 104.3 kg     Height:        Intake/Output Summary (Last 24 hours) at 04/07/2020 0856 Last data filed at 04/07/2020 0836 Gross per 24 hour  Intake 456.92 ml  Output 2200 ml  Net -1743.08 ml   Last 3 Weights 04/07/2020 04/06/2020 04/06/2020  Weight (lbs) 230 lb 233 lb 236 lb  Weight (kg) 104.327 kg 105.688 kg 107.049 kg     Body mass index is 28.75 kg/m.  General:  Well nourished, well developed, in no acute distress HEENT: normal Lymph: no adenopathy Neck: Moderate JVD Endocrine:  No thryomegaly Vascular: No carotid bruits; FA pulses 2+ bilaterally without bruits  Cardiac:  normal S1, S2; RRR; no murmur  Lungs: Diminished breath sounds bilaterally with bibasilar crackles and decreased breath sounds Abd: soft, nontender, no hepatomegaly  Ext: no edema Musculoskeletal:  No  deformities, BUE and BLE strength normal and equal Skin: warm and dry  Neuro:  CNs 2-12 intact, no focal abnormalities noted Psych:  Normal affect   EKG:  The EKG was personally reviewed and demonstrates: Sinus rhythm with right bundle branch block and LVH with repolarization abnormalities.  QRS duration is 176 ms Telemetry:  Telemetry was personally reviewed and demonstrates: Sinus rhythm with no arrhythmia  Relevant CV Studies: Echocardiogram is pending  Laboratory Data:  High Sensitivity Troponin:   Recent Labs  Lab 04/06/20 1617 04/06/20 1721 04/06/20 2018 04/06/20 2237  TROPONINIHS 702* 714* 630* 503*     Chemistry Recent Labs  Lab 04/06/20 1617 04/07/20 0239  NA 138 140  K 5.3* 4.2  CL 105 106  CO2 24 26  GLUCOSE 180* 145*  BUN 44* 39*  CREATININE 1.71* 1.60*  CALCIUM 9.3 9.3  GFRNONAA 38* 41*  GFRAA 44* 48*  ANIONGAP 9 8    No results for input(s): PROT, ALBUMIN, AST, ALT, ALKPHOS, BILITOT in the last 168 hours. Hematology Recent Labs  Lab 04/06/20 1617  WBC 6.4  RBC 3.37*  HGB 11.0*  HCT 33.5*  MCV 99.4  MCH 32.6  MCHC 32.8  RDW 13.2  PLT 193   BNP Recent Labs  Lab 04/06/20 1716  BNP 2,725.0*    DDimer No results for input(s): DDIMER in the last 168 hours.   Radiology/Studies:  DG Chest 2 View  Result Date: 04/06/2020 CLINICAL DATA:  Shortness of breath, dyspnea on exertion for 10 days, previous tobacco abuse EXAM: CHEST - 2 VIEW COMPARISON:  Report only 02/14/2020 FINDINGS: Frontal and lateral views of the chest demonstrate an enlarged cardiac silhouette. There is central vascular congestion with patchy airspace disease  in the right right upper lobe and right lower lobe costophrenic angle. There is diffuse interstitial prominence. Small bilateral pleural effusions are noted. No pneumothorax. No acute bony abnormalities. IMPRESSION: 1. Constellation of findings most consistent with congestive heart failure. Electronically Signed   By: Randa Ngo M.D.   On: 04/06/2020 16:54     Assessment and Plan:   1. Acute on chronic systolic heart failure: The patient appears to be significantly volume overloaded.  I agree with continuing diuresis at the current dose of 40 mg twice daily.  I requested an echocardiogram to evaluate his ejection fraction.  We have to monitor his renal function closely given underlying chronic kidney disease.  I switch lisinopril to losartan in case we decide to switch him to [facility].  Continue carvedilol.  Spironolactone can also be considered as long as kidney function remains stable with no issues of hyperkalemia. 2. Small non-ST elevation myocardial infarction: This could be due to supply demand ischemia.  However, the patient has been having recent substernal chest tightness that correlated with his shortness of breath.  He has known history of coronary artery disease with 3 previous stents as outlined above.  He also has prolonged history of diabetes with high risk of progressive coronary artery disease.  Thus, I recommend proceeding with a right and left cardiac catheterization with possible PCI likely tomorrow.  Biggest issue is chronic kidney disease with risk of contrast-induced nephropathy and especially in the setting of diuresis.  I discussed the procedure in details as well as all risks and benefits and we focused on the risk of contrast-induced nephropathy.  We will get the echo today in order to evaluate EF and avoid left ventricular angiogram during cath.  Contrast volume will have to be minimized and will hold his morning dose of furosemide tomorrow. 3. Essential hypertension: Blood pressure is reasonably controlled and will try to transition him from lisinopril to Entresto.  Lisinopril was switched to losartan today. 4. Hyperlipidemia: Continue atorvastatin 40 mg daily.      For questions or updates, please contact [place] Please consult [email or web address] for contact info under      Signed, Kathlyn Sacramento, MD  04/07/2020 8:56 AM

## 2020-04-07 NOTE — Consult Note (Signed)
Cardiology Consultation:   Patient ID: COTT SERENE MRN: [id number]; DOB: 1941/03/02  Admit date: 04/06/2020 Date of Consult: 04/07/2020  Primary Care Provider: Idelle Crouch, MD Primary Cardiologist: Daniel Rogue, MD  Primary Electrophysiologist:  None    Patient Profile:   Daniel Luna is a 79 y.o. male with a hx of congestive heart failure and coronary artery disease who is being seen today for the evaluation of heart failure exacerbation and chest tightness at the request of Daniel Luna.  History of Present Illness:   Daniel Luna is a 79 year old male with known history of coronary artery disease status post PCI and stent placement to the proximal LAD, mid RCA and distal RCA in 2013 at [facility], chronic systolic heart failure, type 2 diabetes, chronic kidney disease, essential hypertension and hyperlipidemia. Most recent ischemic evaluation in 2016 included a stress test that showed no ischemia with an EF of 35%.  Echocardiogram in 2016 showed an EF of 30 to 35%.  He was seen in our office last week by Daniel Luna for increased shortness of breath with minimal exertion.  He was also having wheezing with some weight gain.  He was noted to be volume overloaded and at that time the dose of furosemide was increased to 40 mg daily for 5 days.  He was scheduled for an echocardiogram which was to be done tomorrow in our office.  However, he did not respond very well to that.  He continued to have progressive exertional dyspnea and then started having substernal chest discomfort and tightness.  He went to urgent care yesterday with the symptoms and was advised to come to the ED for evaluation.  He was noted to have elevated troponin at 714 which remained stable.  BNP was 2700. He was started on IV furosemide 40 mg twice daily with excellent urine output.  He feels slightly better.  No chest pain at the present time.   Past Medical History:  Diagnosis Date  . Arthritis   . CHF (congestive heart  failure) ([place])   . Coronary artery disease    CAD s/p PCI to LAD and RCA 01/2012   . Cyst of brain 1983  . Diabetes mellitus without complication ([place])   . Dysrhythmia   . Heart failure ([place])   . Hyperlipidemia   . Hypertension   . Ischemic cardiomyopathy    LVEF 123XX123 % (systolic and diastolic, NYHA I-II symptoms)   . Macular degeneration   . Shortness of breath dyspnea     Past Surgical History:  Procedure Laterality Date  . APPENDECTOMY  1997  . CARDIAC CATHETERIZATION    . COLONOSCOPY  2005   4 polyps  . crainectomy    . crainoplasty    . CYST EXCISION  08-06-13   back  . cyst removal brain  1983   benign dermatoid cyst  . EYE SURGERY Bilateral 2008, 2012  . stents  2013   3 placed  . TOTAL KNEE ARTHROPLASTY Right 05/21/2015   Procedure: TOTAL KNEE ARTHROPLASTY;  Surgeon: Dereck Leep, MD;  Location: [facility] ORS;  Service: Orthopedics;  Laterality: Right;  . TOTAL KNEE ARTHROPLASTY Left 09/21/2016   Procedure: TOTAL KNEE ARTHROPLASTY;  Surgeon: Hessie Knows, MD;  Location: [facility] ORS;  Service: Orthopedics;  Laterality: Left;  . TOTAL KNEE REVISION Left 09/06/2017   Procedure: LEFT KNEE REVISION WITH POLY EXCHANGE, OPEN SYNOVECTOMY;  Surgeon: Hessie Knows, MD;  Location: [facility] ORS;  Service: Orthopedics;  Laterality: Left;  Home Medications:  Prior to Admission medications   Medication Sig Start Date End Date Taking? Authorizing Provider  carvedilol (COREG) 12.5 MG tablet Take 1 tablet (12.5 mg total) by mouth 2 (two) times daily. 09/13/19 06/13/20 Yes Gollan, Daniel November, MD  furosemide (LASIX) 20 MG tablet Take 1 tablet (20 mg total) by mouth daily. 04/02/20  Yes Daniel Dubonnet, NP  lisinopril (PRINIVIL,ZESTRIL) 40 MG tablet Take 1 tablet (40 mg total) by mouth daily. 07/12/16  Yes Gollan, Daniel November, MD  meloxicam (MOBIC) 15 MG tablet Take 15 mg by mouth at bedtime.  03/06/20  Yes [provider]  metFORMIN (GLUCOPHAGE) 1000 MG tablet Take 1,000 mg by mouth 2 (two)  times daily with a meal.   Yes [provider]  potassium chloride SA (KLOR-CON) 20 MEQ tablet Take 1 tablet (20 mEq total) by mouth daily. 02/25/20  Yes Minna Merritts, MD  sertraline (ZOLOFT) 50 MG tablet Take 50 mg by mouth daily. 08/17/19 08/16/20 Yes [provider]  simvastatin (ZOCOR) 80 MG tablet Take 1 tablet (80 mg total) by mouth at bedtime. 07/12/16  Yes Minna Merritts, MD    Inpatient Medications: Scheduled Meds: . aspirin EC  81 mg Oral Daily  . atorvastatin  40 mg Oral Daily  . carvedilol  12.5 mg Oral BID  . furosemide  40 mg Intravenous BID  . insulin aspart  0-5 Units Subcutaneous QHS  . insulin aspart  0-9 Units Subcutaneous TID WC  . losartan  100 mg Oral Daily  . potassium chloride SA  20 mEq Oral Daily  . sertraline  50 mg Oral Daily   Continuous Infusions: . heparin 1,600 Units/hr (04/06/20 1750)   PRN Meds: acetaminophen, nitroGLYCERIN, ondansetron (ZOFRAN) IV  Allergies:    Allergies  Allergen Reactions  . Iodinated Diagnostic Agents Hives    Social History:   Social History   Socioeconomic History  . Marital status: Married    Spouse name: Not on file  . Number of children: Not on file  . Years of education: Not on file  . Highest education level: Not on file  Occupational History  . Not on file  Tobacco Use  . Smoking status: Former Smoker    Packs/day: 2.00    Years: 27.00    Pack years: 54.00    Types: Cigarettes    Quit date: 12/21/1988    Years since quitting: 31.3  . Smokeless tobacco: Never Used  Substance and Sexual Activity  . Alcohol use: Yes    Comment: couple beer a week  . Drug use: No  . Sexual activity: Not on file  Other Topics Concern  . Not on file  Social History Narrative  . Not on file   Social Determinants of Health   Financial Resource Strain:   . Difficulty of Paying Living Expenses:   Food Insecurity:   . Worried About Charity fundraiser in the Last Year:   . Arboriculturist in the  Last Year:   Transportation Needs:   . Film/video editor (Medical):   Marland Kitchen Lack of Transportation (Non-Medical):   Physical Activity:   . Days of Exercise per Week:   . Minutes of Exercise per Session:   Stress:   . Feeling of Stress :   Social Connections:   . Frequency of Communication with Friends and Family:   . Frequency of Social Gatherings with Friends and Family:   . Attends Religious Services:   .  Active Member of Clubs or Organizations:   . Attends Archivist Meetings:   Marland Kitchen Marital Status:   Intimate Partner Violence:   . Fear of Current or Ex-Partner:   . Emotionally Abused:   Marland Kitchen Physically Abused:   . Sexually Abused:     Family History:    Family History  Problem Relation Age of Onset  . Throat cancer Mother      ROS:  Please see the history of present illness.   All other ROS reviewed and negative.     Physical Exam/Data:   Vitals:   04/07/20 0058 04/07/20 0201 04/07/20 0719 04/07/20 0831  BP:  125/89 (!) 155/107 (!) 148/97  Pulse:  82 92 96  Resp:  20 19   Temp:  97.9 F (36.6 C) 97.7 F (36.5 C)   TempSrc:  Oral Oral   SpO2:  98% 97% 96%  Weight: 104.3 kg     Height:        Intake/Output Summary (Last 24 hours) at 04/07/2020 0856 Last data filed at 04/07/2020 0836 Gross per 24 hour  Intake 456.92 ml  Output 2200 ml  Net -1743.08 ml   Last 3 Weights 04/07/2020 04/06/2020 04/06/2020  Weight (lbs) 230 lb 233 lb 236 lb  Weight (kg) 104.327 kg 105.688 kg 107.049 kg     Body mass index is 28.75 kg/m.  General:  Well nourished, well developed, in no acute distress HEENT: normal Lymph: no adenopathy Neck: Moderate JVD Endocrine:  No thryomegaly Vascular: No carotid bruits; FA pulses 2+ bilaterally without bruits  Cardiac:  normal S1, S2; RRR; no murmur  Lungs: Diminished breath sounds bilaterally with bibasilar crackles and decreased breath sounds Abd: soft, nontender, no hepatomegaly  Ext: no edema Musculoskeletal:  No  deformities, BUE and BLE strength normal and equal Skin: warm and dry  Neuro:  CNs 2-12 intact, no focal abnormalities noted Psych:  Normal affect   EKG:  The EKG was personally reviewed and demonstrates: Sinus rhythm with right bundle branch block and LVH with repolarization abnormalities.  QRS duration is 176 ms Telemetry:  Telemetry was personally reviewed and demonstrates: Sinus rhythm with no arrhythmia  Relevant CV Studies: Echocardiogram is pending  Laboratory Data:  High Sensitivity Troponin:   Recent Labs  Lab 04/06/20 1617 04/06/20 1721 04/06/20 2018 04/06/20 2237  TROPONINIHS 702* 714* 630* 503*     Chemistry Recent Labs  Lab 04/06/20 1617 04/07/20 0239  NA 138 140  K 5.3* 4.2  CL 105 106  CO2 24 26  GLUCOSE 180* 145*  BUN 44* 39*  CREATININE 1.71* 1.60*  CALCIUM 9.3 9.3  GFRNONAA 38* 41*  GFRAA 44* 48*  ANIONGAP 9 8    No results for input(s): PROT, ALBUMIN, AST, ALT, ALKPHOS, BILITOT in the last 168 hours. Hematology Recent Labs  Lab 04/06/20 1617  WBC 6.4  RBC 3.37*  HGB 11.0*  HCT 33.5*  MCV 99.4  MCH 32.6  MCHC 32.8  RDW 13.2  PLT 193   BNP Recent Labs  Lab 04/06/20 1716  BNP 2,725.0*    DDimer No results for input(s): DDIMER in the last 168 hours.   Radiology/Studies:  DG Chest 2 View  Result Date: 04/06/2020 CLINICAL DATA:  Shortness of breath, dyspnea on exertion for 10 days, previous tobacco abuse EXAM: CHEST - 2 VIEW COMPARISON:  Report only 02/14/2020 FINDINGS: Frontal and lateral views of the chest demonstrate an enlarged cardiac silhouette. There is central vascular congestion with patchy airspace disease  in the right right upper lobe and right lower lobe costophrenic angle. There is diffuse interstitial prominence. Small bilateral pleural effusions are noted. No pneumothorax. No acute bony abnormalities. IMPRESSION: 1. Constellation of findings most consistent with congestive heart failure. Electronically Signed   By: Randa Ngo M.D.   On: 04/06/2020 16:54     Assessment and Plan:   1. Acute on chronic systolic heart failure: The patient appears to be significantly volume overloaded.  I agree with continuing diuresis at the current dose of 40 mg twice daily.  I requested an echocardiogram to evaluate his ejection fraction.  We have to monitor his renal function closely given underlying chronic kidney disease.  I switch lisinopril to losartan in case we decide to switch him to [facility].  Continue carvedilol.  Spironolactone can also be considered as long as kidney function remains stable with no issues of hyperkalemia. 2. Small non-ST elevation myocardial infarction: This could be due to supply demand ischemia.  However, the patient has been having recent substernal chest tightness that correlated with his shortness of breath.  He has known history of coronary artery disease with 3 previous stents as outlined above.  He also has prolonged history of diabetes with high risk of progressive coronary artery disease.  Thus, I recommend proceeding with a right and left cardiac catheterization with possible PCI likely tomorrow.  Biggest issue is chronic kidney disease with risk of contrast-induced nephropathy and especially in the setting of diuresis.  I discussed the procedure in details as well as all risks and benefits and we focused on the risk of contrast-induced nephropathy.  We will get the echo today in order to evaluate EF and avoid left ventricular angiogram during cath.  Contrast volume will have to be minimized and will hold his morning dose of furosemide tomorrow. 3. Essential hypertension: Blood pressure is reasonably controlled and will try to transition him from lisinopril to Entresto.  Lisinopril was switched to losartan today. 4. Hyperlipidemia: Continue atorvastatin 40 mg daily.      For questions or updates, please contact [place] Please consult [email or web address] for contact info under      Signed, Kathlyn Sacramento, MD  04/07/2020 8:56 AM

## 2020-04-07 NOTE — Progress Notes (Addendum)
Patient going for possible cardiac cath in the morning.  Order for Heparin to stopped earlier on in the day.   Missed Order- heparin stopped this evening.  Called cardiology this evening to confirm it was suppose to be stopped.  Night MD is going to double check and let us know.    Heparin level labs look like they are due around 0200 4/27-  called pharmacy to check their orders.   Per pharmacist- their side had also been discontinued.    Will leave hep drip off for now.

## 2020-04-07 NOTE — Progress Notes (Signed)
Daniel Luna Luna NAME: Daniel Luna Luna    MR#:  [id number]  DATE OF BIRTH:  Sep 14, 1941  SUBJECTIVE:   Patient came in with increasing shortness of breath and has issues with anxiety. Feels better after IV Lasix yesterday and the emergency room. Sitting at the edge of the bed eating breakfast.  Tells me he could not sleep last night REVIEW OF SYSTEMS:   Review of Systems  Constitutional: Negative for chills, fever and weight loss.  HENT: Negative for ear discharge, ear pain and nosebleeds.   Eyes: Negative for blurred vision, pain and discharge.  Respiratory: Positive for shortness of breath. Negative for sputum production, wheezing and stridor.   Cardiovascular: Positive for PND. Negative for chest pain, palpitations and orthopnea.  Gastrointestinal: Negative for abdominal pain, diarrhea, nausea and vomiting.  Genitourinary: Negative for frequency and urgency.  Musculoskeletal: Negative for back pain and joint pain.  Neurological: Negative for sensory change, speech change, focal weakness and weakness.  Psychiatric/Behavioral: Negative for depression and hallucinations. The patient is not nervous/anxious.    Tolerating Diet:yes Tolerating PT: ambulatory  DRUG ALLERGIES:   Allergies  Allergen Reactions  . Iodinated Diagnostic Agents Hives    VITALS:  Blood pressure (!) 148/97, pulse 96, temperature 97.7 F (36.5 C), temperature source Oral, resp. rate 19, height 6\' 3"  (1.905 m), weight 104.3 kg, SpO2 96 %.  PHYSICAL EXAMINATION:   Physical Exam  GENERAL:  79 y.o.-year-old patient lying in the bed with no acute distress.  EYES: Pupils equal, round, reactive to light and accommodation. No scleral icterus.   HEENT: Head atraumatic, normocephalic. Oropharynx and nasopharynx clear.  NECK:  Supple, no jugular venous distention. No thyroid enlargement, no tenderness.  LUNGS: Normal breath sounds bilaterally, no wheezing, rales, rhonchi.  No use of accessory muscles of respiration.  CARDIOVASCULAR: S1, S2 normal. No murmurs, rubs, or gallops.  ABDOMEN: Soft, nontender, nondistended. Bowel sounds present. No organomegaly or mass.  EXTREMITIES: No cyanosis, clubbing or edema b/l.    NEUROLOGIC: Cranial nerves II through XII are intact. No focal Motor or sensory deficits b/l.   PSYCHIATRIC:  patient is alert and oriented x 3.  SKIN: No obvious rash, lesion, or ulcer.   LABORATORY PANEL:  CBC Recent Labs  Lab 04/06/20 1617  WBC 6.4  HGB 11.0*  HCT 33.5*  PLT 193    Chemistries  Recent Labs  Lab 04/07/20 0239  NA 140  K 4.2  CL 106  CO2 26  GLUCOSE 145*  BUN 39*  CREATININE 1.60*  CALCIUM 9.3   Cardiac Enzymes No results for input(s): TROPONINI in the last 168 hours. RADIOLOGY:  DG Chest 2 View  Result Date: 04/06/2020 CLINICAL DATA:  Shortness of breath, dyspnea on exertion for 10 days, previous tobacco abuse EXAM: CHEST - 2 VIEW COMPARISON:  Report only 02/14/2020 FINDINGS: Frontal and lateral views of the chest demonstrate an enlarged cardiac silhouette. There is central vascular congestion with patchy airspace disease in the right right upper lobe and right lower lobe costophrenic angle. There is diffuse interstitial prominence. Small bilateral pleural effusions are noted. No pneumothorax. No acute bony abnormalities. IMPRESSION: 1. Constellation of findings most consistent with congestive heart failure. Electronically Signed   By: Randa Ngo M.D.   On: 04/06/2020 16:54   ASSESSMENT AND PLAN:  Daniel Luna Luna  is a 79 y.o. male with a known history of coronary artery disease status post tree stands at [facility] in 2013, hypertension, hyperlipidemia,  chronic systolic congestive heart failure with LV ejection fraction of about 35%, history of life vesicular heart block and removed tobacco abuse, type II diabetes comes to the emergency room with increasing shortness of breath, increased fatigue ability with regular  walking and chest congestion with tightness on and off since last week.  1. Acute on chronic systolic congestive heart failure -on LV EF of 35%, increased weight gain of about 8 to 9 pounds, x-ray consistent with pulmonary vascular congestion/CHF -BNP 2725 -IV Lasix 40 mg bid -monitor input output -Reds vest reading yday-- NOT done  2. Acute coronary syndrome/ non-STEMI of coronary artery disease with stents x3 in 2013 at [facility] -patient has increasing shortness of breath with chest tightness and elevated troponin of 703 -cycle troponin -Dr. Fletcher Anon plans  Left and  right heart cath tomorrow -IV heparin drip, aspirin, beta blockers, statins and ACE inhibitor  3. Type II diabetes with CKD -IIIb -hold metformin in anticipation for procedure plan tomorrow -sliding scale insulin -creat stable  4. Hyperlipidemia continue statins  5. Hypertension - continue Coreg, lisinopril  6. DVT prophylaxis on full dose heparin drip   Family Communication : Ivin Booty and wife in the ER Consults : [facility] cardiology-Dr Fletcher Anon Code Status : full code DVT prophylaxis : heparin drip  Status is: Inpatient  Remains inpatient appropriate because:Ongoing diagnostic testing needed not appropriate for outpatient work up  Cardiac cath tomorrow, IV lasix for CHF   Dispo: The patient is from: Home              Anticipated d/c is to: Home              Anticipated d/c date is: 2 days              Patient currently is not medically stable to d/c.         TOTAL TIME TAKING CARE OF THIS PATIENT: 35 minutes.  >50% time spent on counselling and coordination of care  Note: This dictation was prepared with Dragon dictation along with smaller phrase technology. Any transcriptional errors that result from this process are unintentional.  Daniel Luna Luna M.D    [facility]   CC: Primary care physician; Daniel Luna Luna, MDPatient ID: Daniel Luna Luna, male   DOB: [birth year], 79 y.o.   MRN: [id number]

## 2020-04-07 NOTE — Consult Note (Signed)
ANTICOAGULATION CONSULT NOTE - Initial Consult  Pharmacy Consult for Heparin Infusion Indication: chest pain/ACS  Allergies  Allergen Reactions  . Iodinated Diagnostic Agents Hives    Patient Measurements: Height: 6\' 3"  (190.5 cm) Weight: 104.3 kg (230 lb) IBW/kg (Calculated) : 84.5 Heparin Dosing Weight: 106.1 kg  Vital Signs: Temp: 97.9 F (36.6 C) (04/26 0201) Temp Source: Oral (04/26 0201) BP: 125/89 (04/26 0201) Pulse Rate: 82 (04/26 0201)  Labs: Recent Labs    04/06/20 1617 04/06/20 1617 04/06/20 1721 04/06/20 2018 04/06/20 2237 04/07/20 0239  HGB 11.0*  --   --   --   --   --   HCT 33.5*  --   --   --   --   --   PLT 193  --   --   --   --   --   APTT  --   --  36  --   --   --   LABPROT  --   --  14.5  --   --   --   INR  --   --  1.1  --   --   --   HEPARINUNFRC  --   --   --   --   --  0.64  CREATININE 1.71*  --   --   --   --  1.60*  TROPONINIHS 702*   < > 714* 630* 503*  --    < > = values in this interval not displayed.    Estimated Creatinine Clearance: 51.3 mL/min (A) (by C-G formula based on SCr of 1.6 mg/dL (H)).   Medical History: Past Medical History:  Diagnosis Date  . Arthritis   . CHF (congestive heart failure) ([place])   . Coronary artery disease    CAD s/p PCI to LAD and RCA 01/2012   . Cyst of brain 1983  . Diabetes mellitus without complication ([place])   . Dysrhythmia   . Heart failure ([place])   . Hyperlipidemia   . Hypertension   . Ischemic cardiomyopathy    LVEF 123XX123 % (systolic and diastolic, NYHA I-II symptoms)   . Macular degeneration   . Shortness of breath dyspnea     Medications:  Medications Prior to Admission  Medication Sig Dispense Refill Last Dose  . carvedilol (COREG) 12.5 MG tablet Take 1 tablet (12.5 mg total) by mouth 2 (two) times daily. 180 tablet 2 04/06/2020 at 0730  . furosemide (LASIX) 20 MG tablet Take 1 tablet (20 mg total) by mouth daily. 90 tablet 3 04/06/2020 at 0730  . lisinopril (PRINIVIL,ZESTRIL) 40  MG tablet Take 1 tablet (40 mg total) by mouth daily. 90 tablet 3 04/06/2020 at 0730  . meloxicam (MOBIC) 15 MG tablet Take 15 mg by mouth at bedtime.    04/05/2020 at Unknown time  . metFORMIN (GLUCOPHAGE) 1000 MG tablet Take 1,000 mg by mouth 2 (two) times daily with a meal.   04/06/2020 at 0730  . potassium chloride SA (KLOR-CON) 20 MEQ tablet Take 1 tablet (20 mEq total) by mouth daily. 30 tablet 6 04/06/2020 at 0730  . sertraline (ZOLOFT) 50 MG tablet Take 50 mg by mouth daily.   04/06/2020 at 0730  . simvastatin (ZOCOR) 80 MG tablet Take 1 tablet (80 mg total) by mouth at bedtime. 90 tablet 3 04/05/2020 at Unknown time   Scheduled:  . aspirin EC  81 mg Oral Daily  . atorvastatin  40 mg Oral Daily  . carvedilol  12.5  mg Oral BID  . furosemide  20 mg Oral Daily  . insulin aspart  0-5 Units Subcutaneous QHS  . insulin aspart  0-9 Units Subcutaneous TID WC  . lisinopril  40 mg Oral Daily  . potassium chloride SA  20 mEq Oral Daily  . sertraline  50 mg Oral Daily   Infusions:  . heparin 1,600 Units/hr (04/06/20 1750)   PRN:  Anti-infectives (From admission, onward)   None      Assessment: Pharmacy has been consulted to initiate Heparin Infusion in 79yo male with known history of CAD and CHF presenting to the ED with increased shortness of breath with wheezing. No prior history of PTA anticoagulant use. Baseline labs: aPTT 36sec, INR 1.1, Hgb 11.0, PLTs 193. Will initiate Heparin Drip immediately.  Goal of Therapy:  Heparin level 0.3-0.7 units/ml Monitor platelets by anticoagulation protocol: Yes   Plan:  04/26 @ 0230 HL 0.64 therapeutic. Will continue current rate and will recheck HL at 1100 and continue to monitor.  Tobie Lords, PharmD, BCPS Clinical Pharmacist 04/07/2020,5:28 AM

## 2020-04-07 NOTE — Consult Note (Signed)
ANTICOAGULATION CONSULT NOTE - Initial Consult  Pharmacy Consult for Heparin Infusion Indication: chest pain/ACS  Allergies  Allergen Reactions  . Iodinated Diagnostic Agents Hives    Patient Measurements: Height: 6\' 3"  (190.5 cm) Weight: 104.3 kg (230 lb) IBW/kg (Calculated) : 84.5 Heparin Dosing Weight: 106.1 kg  Vital Signs: Temp: 97.7 F (36.5 C) (04/26 0719) Temp Source: Oral (04/26 0719) BP: 148/97 (04/26 0831) Pulse Rate: 96 (04/26 0831)  Labs: Recent Labs    04/06/20 1617 04/06/20 1617 04/06/20 1721 04/06/20 2018 04/06/20 2237 04/07/20 0239 04/07/20 1048  HGB 11.0*  --   --   --   --   --   --   HCT 33.5*  --   --   --   --   --   --   PLT 193  --   --   --   --   --   --   APTT  --   --  36  --   --   --   --   LABPROT  --   --  14.5  --   --   --   --   INR  --   --  1.1  --   --   --   --   HEPARINUNFRC  --   --   --   --   --  0.64 0.57  CREATININE 1.71*  --   --   --   --  1.60*  --   TROPONINIHS 702*   < > 714* 630* 503*  --   --    < > = values in this interval not displayed.    Estimated Creatinine Clearance: 51.3 mL/min (A) (by C-G formula based on SCr of 1.6 mg/dL (H)).   Medical History: Past Medical History:  Diagnosis Date  . Arthritis   . CHF (congestive heart failure) ([place])   . Coronary artery disease    CAD s/p PCI to LAD and RCA 01/2012   . Cyst of brain 1983  . Diabetes mellitus without complication ([place])   . Dysrhythmia   . Heart failure ([place])   . Hyperlipidemia   . Hypertension   . Ischemic cardiomyopathy    LVEF 123XX123 % (systolic and diastolic, NYHA I-II symptoms)   . Macular degeneration   . Shortness of breath dyspnea     Medications:  Medications Prior to Admission  Medication Sig Dispense Refill Last Dose  . carvedilol (COREG) 12.5 MG tablet Take 1 tablet (12.5 mg total) by mouth 2 (two) times daily. 180 tablet 2 04/06/2020 at 0730  . furosemide (LASIX) 20 MG tablet Take 1 tablet (20 mg total) by mouth daily. 90  tablet 3 04/06/2020 at 0730  . lisinopril (PRINIVIL,ZESTRIL) 40 MG tablet Take 1 tablet (40 mg total) by mouth daily. 90 tablet 3 04/06/2020 at 0730  . meloxicam (MOBIC) 15 MG tablet Take 15 mg by mouth at bedtime.    04/05/2020 at Unknown time  . metFORMIN (GLUCOPHAGE) 1000 MG tablet Take 1,000 mg by mouth 2 (two) times daily with a meal.   04/06/2020 at 0730  . potassium chloride SA (KLOR-CON) 20 MEQ tablet Take 1 tablet (20 mEq total) by mouth daily. 30 tablet 6 04/06/2020 at 0730  . sertraline (ZOLOFT) 50 MG tablet Take 50 mg by mouth daily.   04/06/2020 at 0730  . simvastatin (ZOCOR) 80 MG tablet Take 1 tablet (80 mg total) by mouth at bedtime. 90 tablet 3 04/05/2020 at  Unknown time   Scheduled:  . aspirin EC  81 mg Oral Daily  . atorvastatin  40 mg Oral Daily  . carvedilol  12.5 mg Oral BID  . furosemide  40 mg Intravenous BID  . insulin aspart  0-5 Units Subcutaneous QHS  . insulin aspart  0-9 Units Subcutaneous TID WC  . losartan  100 mg Oral Daily  . potassium chloride SA  20 mEq Oral Daily  . sertraline  50 mg Oral Daily   Infusions:   PRN:  Anti-infectives (From admission, onward)   None      Assessment: Pharmacy has been consulted to initiate Heparin Infusion in 79yo male with known history of CAD and CHF presenting to the ED with increased shortness of breath with wheezing. No prior history of PTA anticoagulant use. Baseline labs: aPTT 36sec, INR 1.1, Hgb 11.0, PLTs 193. Will initiate Heparin Drip immediately.  Goal of Therapy:  Heparin level 0.3-0.7 units/ml Monitor platelets by anticoagulation protocol: Yes   Plan:  Heparin level therapeutic x 2 . Will continue current rate and will recheck heparin level and CBC with AM labs. Plan to cath tomorrow.   Eleonore Chiquito, PharmD, BCPS Clinical Pharmacist 04/07/2020,11:35 AM

## 2020-04-08 ENCOUNTER — Other Ambulatory Visit: Payer: Medicare Other

## 2020-04-08 ENCOUNTER — Encounter
Admission: EM | Disposition: A | Discharge status: Other Acute Inpatient Hospital | Payer: Self-pay | Source: Home / Self Care | Attending: Internal Medicine

## 2020-04-08 ENCOUNTER — Encounter: Payer: Self-pay | Admitting: Internal Medicine

## 2020-04-08 DIAGNOSIS — N1832 Chronic kidney disease, stage 3b: Secondary | ICD-10-CM

## 2020-04-08 DIAGNOSIS — I214 Non-ST elevation (NSTEMI) myocardial infarction: Secondary | ICD-10-CM | POA: Diagnosis not present

## 2020-04-08 DIAGNOSIS — I25119 Atherosclerotic heart disease of native coronary artery with unspecified angina pectoris: Secondary | ICD-10-CM | POA: Diagnosis not present

## 2020-04-08 DIAGNOSIS — I5023 Acute on chronic systolic (congestive) heart failure: Secondary | ICD-10-CM | POA: Diagnosis not present

## 2020-04-08 DIAGNOSIS — I2510 Atherosclerotic heart disease of native coronary artery without angina pectoris: Secondary | ICD-10-CM

## 2020-04-08 DIAGNOSIS — I249 Acute ischemic heart disease, unspecified: Secondary | ICD-10-CM | POA: Diagnosis not present

## 2020-04-08 HISTORY — PX: RIGHT/LEFT HEART CATH AND CORONARY ANGIOGRAPHY: CATH118266

## 2020-04-08 LAB — BASIC METABOLIC PANEL
Anion gap: 10 (ref 5–15)
BUN: 39 mg/dL — ABNORMAL HIGH (ref 8–23)
CO2: 26 mmol/L (ref 22–32)
Calcium: 9.4 mg/dL (ref 8.9–10.3)
Chloride: 104 mmol/L (ref 98–111)
Creatinine, Ser: 1.56 mg/dL — ABNORMAL HIGH (ref 0.61–1.24)
GFR calc Af Amer: 49 mL/min — ABNORMAL LOW (ref >60)
GFR calc non Af Amer: 43 mL/min — ABNORMAL LOW (ref >60)
Glucose, Bld: 171 mg/dL — ABNORMAL HIGH (ref 70–99)
Potassium: 3.9 mmol/L (ref 3.5–5.1)
Sodium: 140 mmol/L (ref 135–145)

## 2020-04-08 LAB — CBC
HCT: 34.9 % — ABNORMAL LOW (ref 39.0–52.0)
Hemoglobin: 11.4 g/dL — ABNORMAL LOW (ref 13.0–17.0)
MCH: 32.4 pg (ref 26.0–34.0)
MCHC: 32.7 g/dL (ref 30.0–36.0)
MCV: 99.1 fL (ref 80.0–100.0)
Platelets: 205 10*3/uL (ref 150–400)
RBC: 3.52 MIL/uL — ABNORMAL LOW (ref 4.22–5.81)
RDW: 13.1 % (ref 11.5–15.5)
WBC: 6.2 10*3/uL (ref 4.0–10.5)
nRBC: 0 % (ref 0.0–0.2)

## 2020-04-08 LAB — GLUCOSE, CAPILLARY
Glucose-Capillary: 163 mg/dL — ABNORMAL HIGH (ref 70–99)
Glucose-Capillary: 179 mg/dL — ABNORMAL HIGH (ref 70–99)
Glucose-Capillary: 403 mg/dL — ABNORMAL HIGH (ref 70–99)

## 2020-04-08 LAB — HEPARIN LEVEL (UNFRACTIONATED): Heparin Unfractionated: 0.58 IU/mL (ref 0.30–0.70)

## 2020-04-08 SURGERY — RIGHT/LEFT HEART CATH AND CORONARY ANGIOGRAPHY
Anesthesia: Moderate Sedation

## 2020-04-08 MED ORDER — SODIUM CHLORIDE 0.9% FLUSH: 3.0000 mL | INTRAVENOUS | Status: DC | PRN

## 2020-04-08 MED ORDER — MIDAZOLAM HCL 2 MG/2ML IJ SOLN
INTRAMUSCULAR | Status: AC
  Filled 2020-04-08: qty 2

## 2020-04-08 MED ORDER — MIDAZOLAM HCL 2 MG/2ML IJ SOLN
INTRAMUSCULAR | Status: DC | PRN
  Administered 2020-04-08: 1 mg via INTRAVENOUS

## 2020-04-08 MED ORDER — HEPARIN (PORCINE) IN NACL 1000-0.9 UT/500ML-% IV SOLN
INTRAVENOUS | Status: AC
  Filled 2020-04-08: qty 1000

## 2020-04-08 MED ORDER — WHITE PETROLATUM EX OINT
TOPICAL_OINTMENT | CUTANEOUS | Status: AC
  Filled 2020-04-08: qty 5

## 2020-04-08 MED ORDER — METHYLPREDNISOLONE SODIUM SUCC 125 MG IJ SOLR
125.0000 mg | Freq: Once | INTRAMUSCULAR | Status: AC
  Administered 2020-04-08: 11:00:00 125 mg via INTRAVENOUS

## 2020-04-08 MED ORDER — HEPARIN SODIUM (PORCINE) 1000 UNIT/ML IJ SOLN
INTRAMUSCULAR | Status: DC | PRN
  Administered 2020-04-08: 5000 [IU] via INTRAVENOUS

## 2020-04-08 MED ORDER — FENTANYL CITRATE (PF) 100 MCG/2ML IJ SOLN
INTRAMUSCULAR | Status: DC | PRN
  Administered 2020-04-08: 25 ug via INTRAVENOUS

## 2020-04-08 MED ORDER — SODIUM CHLORIDE 0.9 % IV SOLN: 250.0000 mL | INTRAVENOUS | Status: DC | PRN

## 2020-04-08 MED ORDER — VERAPAMIL HCL 2.5 MG/ML IV SOLN
INTRAVENOUS | Status: DC | PRN
  Administered 2020-04-08: 2.5 mg via INTRAVENOUS

## 2020-04-08 MED ORDER — INSULIN ASPART 100 UNIT/ML ~~LOC~~ SOLN
10.0000 [IU] | Freq: Once | SUBCUTANEOUS | Status: AC
  Administered 2020-04-08: 22:00:00 10 [IU] via SUBCUTANEOUS

## 2020-04-08 MED ORDER — DIPHENHYDRAMINE HCL 50 MG/ML IJ SOLN
50.0000 mg | Freq: Once | INTRAMUSCULAR | Status: AC
  Administered 2020-04-08: 11:00:00 50 mg via INTRAVENOUS

## 2020-04-08 MED ORDER — ATORVASTATIN CALCIUM 80 MG PO TABS
80.0000 mg | ORAL_TABLET | Freq: Every day | ORAL | Status: DC
  Administered 2020-04-09 – 2020-04-10 (×2): 80 mg via ORAL
  Filled 2020-04-08 (×2): qty 1

## 2020-04-08 MED ORDER — HYDRALAZINE HCL 20 MG/ML IJ SOLN: 10.0000 mg | INTRAMUSCULAR | Status: AC | PRN

## 2020-04-08 MED ORDER — HEPARIN (PORCINE) IN NACL 1000-0.9 UT/500ML-% IV SOLN
INTRAVENOUS | Status: DC | PRN
  Administered 2020-04-08: 500 mL

## 2020-04-08 MED ORDER — HEPARIN SODIUM (PORCINE) 1000 UNIT/ML IJ SOLN
INTRAMUSCULAR | Status: AC
  Filled 2020-04-08: qty 1

## 2020-04-08 MED ORDER — METHYLPREDNISOLONE SODIUM SUCC 125 MG IJ SOLR
INTRAMUSCULAR | Status: AC
  Filled 2020-04-08: qty 2

## 2020-04-08 MED ORDER — VERAPAMIL HCL 2.5 MG/ML IV SOLN
INTRAVENOUS | Status: AC
  Filled 2020-04-08: qty 2

## 2020-04-08 MED ORDER — DIPHENHYDRAMINE HCL 50 MG/ML IJ SOLN
INTRAMUSCULAR | Status: AC
  Filled 2020-04-08: qty 1

## 2020-04-08 MED ORDER — FUROSEMIDE 10 MG/ML IJ SOLN
60.0000 mg | Freq: Two times a day (BID) | INTRAMUSCULAR | Status: DC
  Administered 2020-04-08 – 2020-04-09 (×2): 60 mg via INTRAVENOUS
  Filled 2020-04-08 (×2): qty 6

## 2020-04-08 MED ORDER — SODIUM CHLORIDE 0.9% FLUSH
3.0000 mL | Freq: Two times a day (BID) | INTRAVENOUS | Status: DC
  Administered 2020-04-08 – 2020-04-10 (×4): 3 mL via INTRAVENOUS

## 2020-04-08 MED ORDER — FENTANYL CITRATE (PF) 100 MCG/2ML IJ SOLN
INTRAMUSCULAR | Status: AC
  Filled 2020-04-08: qty 2

## 2020-04-08 MED ORDER — HEPARIN (PORCINE) 25000 UT/250ML-% IV SOLN
1450.0000 [IU]/h | INTRAVENOUS | Status: DC
  Administered 2020-04-08 – 2020-04-09 (×2): 1600 [IU]/h via INTRAVENOUS
  Administered 2020-04-10: 12:00:00 1450 [IU]/h via INTRAVENOUS
  Filled 2020-04-08 (×3): qty 250

## 2020-04-08 MED ORDER — IOHEXOL 300 MG/ML  SOLN
INTRAMUSCULAR | Status: DC | PRN
  Administered 2020-04-08: 55 mL

## 2020-04-08 SURGICAL SUPPLY — 9 items
CATH 5F 110X4 TIG (CATHETERS) ×3 IMPLANT
CATH BALLN WEDGE 5F 110CM (CATHETERS) ×3 IMPLANT
DEVICE RAD TR BAND REGULAR (VASCULAR PRODUCTS) ×3 IMPLANT
GLIDESHEATH SLEND SS 6F .021 (SHEATH) ×3 IMPLANT
GUIDEWIRE INQWIRE 1.5J.035X260 (WIRE) ×1 IMPLANT
INQWIRE 1.5J .035X260CM (WIRE) ×3
KIT MANI 3VAL PERCEP (MISCELLANEOUS) ×3 IMPLANT
PACK CARDIAC CATH (CUSTOM PROCEDURE TRAY) ×3 IMPLANT
SHEATH GLIDE SLENDER 4/5FR (SHEATH) ×3 IMPLANT

## 2020-04-08 NOTE — Interval H&P Note (Signed)
History and Physical Interval Note:  04/08/2020 2:18 PM  Daniel Luna  has presented today for surgery, with the diagnosis of acute on chronic systolic heart failure/non-ST elevation myocardial infarction.  The various methods of treatment have been discussed with the patient and family. After consideration of risks, benefits and other options for treatment, the patient has consented to  Procedure(s): RIGHT/LEFT HEART CATH AND CORONARY ANGIOGRAPHY (N/A) as a surgical intervention.  The patient's history has been reviewed, patient examined, no change in status, stable for surgery.  I have reviewed the patient's chart and labs.  Questions were answered to the patient's satisfaction.    Cath Lab Visit (complete for each Cath Lab visit)  Clinical Evaluation Leading to the Procedure:   ACS: Yes.    Non-ACS:  N/A  [place] Lust

## 2020-04-08 NOTE — Progress Notes (Signed)
[facility] NAME: Daniel Luna    MR#:  [id number]  DATE OF BIRTH:  [birth year]  SUBJECTIVE:   Patient came in with increasing shortness of breath and has issues with anxiety. Feels better after IV Lasix. Good urine output. Denies chest pain wife in the room REVIEW OF SYSTEMS:   Review of Systems  Constitutional: Negative for chills, fever and weight loss.  HENT: Negative for ear discharge, ear pain and nosebleeds.   Eyes: Negative for blurred vision, pain and discharge.  Respiratory: Positive for shortness of breath. Negative for sputum production, wheezing and stridor.   Cardiovascular: Negative for chest pain, palpitations and orthopnea.  Gastrointestinal: Negative for abdominal pain, diarrhea, nausea and vomiting.  Genitourinary: Negative for frequency and urgency.  Musculoskeletal: Negative for back pain and joint pain.  Neurological: Negative for sensory change, speech change, focal weakness and weakness.  Psychiatric/Behavioral: Negative for depression and hallucinations. The patient is not nervous/anxious.    Tolerating Diet:yes Tolerating PT: ambulatory  DRUG ALLERGIES:   Allergies  Allergen Reactions  . Iodinated Diagnostic Agents Hives    VITALS:  Blood pressure 113/81, pulse 72, temperature 98.3 F (36.8 C), temperature source Oral, resp. rate 18, height 6\' 3"  (1.905 m), weight 100 kg, SpO2 96 %.  PHYSICAL EXAMINATION:   Physical Exam  GENERAL:  79 y.o.-year-old patient lying in the bed with no acute distress.  EYES: Pupils equal, round, reactive to light and accommodation. No scleral icterus.   HEENT: Head atraumatic, normocephalic. Oropharynx and nasopharynx clear.  NECK:  Supple, no jugular venous distention. No thyroid enlargement, no tenderness.  LUNGS: Normal breath sounds bilaterally, no wheezing, rales, rhonchi. No use of accessory muscles of respiration.  CARDIOVASCULAR: S1, S2 normal. No murmurs,  rubs, or gallops.  ABDOMEN: Soft, nontender, nondistended. Bowel sounds present. No organomegaly or mass.  EXTREMITIES: No cyanosis, clubbing or edema b/l.    NEUROLOGIC: Cranial nerves II through XII are intact. No focal Motor or sensory deficits b/l.   PSYCHIATRIC:  patient is alert and oriented x 3.  SKIN: No obvious rash, lesion, or ulcer.   LABORATORY PANEL:  CBC Recent Labs  Lab 04/08/20 0634  WBC 6.2  HGB 11.4*  HCT 34.9*  PLT 205    Chemistries  Recent Labs  Lab 04/08/20 0634  NA 140  K 3.9  CL 104  CO2 26  GLUCOSE 171*  BUN 39*  CREATININE 1.56*  CALCIUM 9.4   Cardiac Enzymes No results for input(s): TROPONINI in the last 168 hours. RADIOLOGY:  DG Chest 2 View  Result Date: 04/06/2020 CLINICAL DATA:  Shortness of breath, dyspnea on exertion for 10 days, previous tobacco abuse EXAM: CHEST - 2 VIEW COMPARISON:  Report only 02/14/2020 FINDINGS: Frontal and lateral views of the chest demonstrate an enlarged cardiac silhouette. There is central vascular congestion with patchy airspace disease in the right right upper lobe and right lower lobe costophrenic angle. There is diffuse interstitial prominence. Small bilateral pleural effusions are noted. No pneumothorax. No acute bony abnormalities. IMPRESSION: 1. Constellation of findings most consistent with congestive heart failure. Electronically Signed   By: Randa Ngo M.D.   On: 04/06/2020 16:54   ECHOCARDIOGRAM COMPLETE  Result Date: 04/07/2020    ECHOCARDIOGRAM REPORT   Patient Name:   Daniel Luna Date of Exam: 04/07/2020 Medical Rec #:  [id number]    Height:       75.0 in Accession #:    [id number]  Weight:       230.0 lb Date of Birth:  1941-03-07     BSA:          2.329 m Patient Age:    79 years     BP:           117/80 mmHg Patient Gender: M            HR:           73 bpm. Exam Location:  [facility] Procedure: 2D Echo, Cardiac Doppler and Color Doppler Indications:     Acute myocardial infarction 410  History:          Patient has no prior history of Echocardiogram examinations.                  CHF, Signs/Symptoms:Shortness of Breath and Dyspnea; Risk                  Factors:Hypertension and Diabetes. Ischemic cardiomyopathy.  Sonographer:     Sherrie Sport RDCS (AE) Referring Phys:  [place] Diagnosing Phys: Kathlyn Sacramento MD IMPRESSIONS  1. Left ventricular ejection fraction, by estimation, is 15-20%. The left ventricle has severely decreased function. The left ventricular internal cavity size was moderately dilated. There is moderate left ventricular hypertrophy. Left ventricular diastolic parameters are consistent with Grade II diastolic dysfunction (pseudonormalization).  2. Right ventricular systolic function is mildly reduced. The right ventricular size is normal. There is mildly elevated pulmonary artery systolic pressure.  3. Left atrial size was moderately dilated.  4. Right atrial size was moderately dilated.  5. Moderate pleural effusion in the left lateral region.  6. The mitral valve is normal in structure. Moderate mitral valve regurgitation. No evidence of mitral stenosis.  7. The aortic valve is normal in structure. Aortic valve regurgitation is not visualized. Mild to moderate aortic valve sclerosis/calcification is present, without any evidence of aortic stenosis. FINDINGS  Left Ventricle: Left ventricular ejection fraction, by estimation, is <20%. The left ventricle has severely decreased function. The left ventricle demonstrates regional wall motion abnormalities. The left ventricular internal cavity size was moderately dilated. There is moderate left ventricular hypertrophy. Left ventricular diastolic parameters are consistent with Grade II diastolic dysfunction (pseudonormalization). Right Ventricle: The right ventricular size is normal. No increase in right ventricular wall thickness. Right ventricular systolic function is mildly reduced. There is mildly elevated pulmonary artery systolic  pressure. The tricuspid regurgitant velocity  is 2.59 m/s, and with an assumed right atrial pressure of 10 mmHg, the estimated right ventricular systolic pressure is Q000111Q mmHg. Left Atrium: Left atrial size was moderately dilated. Right Atrium: Right atrial size was moderately dilated. Pericardium: There is no evidence of pericardial effusion. Mitral Valve: The mitral valve is normal in structure. There is mild thickening of the mitral valve leaflet(s). Normal mobility of the mitral valve leaflets. Moderate mitral valve regurgitation. No evidence of mitral valve stenosis. Tricuspid Valve: The tricuspid valve is normal in structure. Tricuspid valve regurgitation is trivial. No evidence of tricuspid stenosis. Aortic Valve: The aortic valve is normal in structure. Aortic valve regurgitation is not visualized. Mild to moderate aortic valve sclerosis/calcification is present, without any evidence of aortic stenosis. Aortic valve mean gradient measures 4.0 mmHg. Aortic valve peak gradient measures 6.5 mmHg. Aortic valve area, by VTI measures 1.54 cm. Pulmonic Valve: The pulmonic valve was normal in structure. Pulmonic valve regurgitation is not visualized. No evidence of pulmonic stenosis. Aorta: The aortic root is normal in size and  structure. Venous: The inferior vena cava was not well visualized. IAS/Shunts: No atrial level shunt detected by color flow Doppler. Additional Comments: There is a moderate pleural effusion in the left lateral region.  LEFT VENTRICLE PLAX 2D LVIDd:         6.54 cm      Diastology LVIDs:         5.90 cm      LV e' lateral:   8.70 cm/s LV PW:         1.03 cm      LV E/e' lateral: 11.7 LV IVS:        1.23 cm      LV e' medial:    4.57 cm/s LVOT diam:     2.20 cm      LV E/e' medial:  22.3 LV SV:         32 LV SV Index:   14 LVOT Area:     3.80 cm  LV Volumes (MOD) LV vol d, MOD A2C: 234.0 ml LV vol d, MOD A4C: 268.0 ml LV vol s, MOD A2C: 185.0 ml LV vol s, MOD A4C: 204.0 ml LV SV MOD A2C:      49.0 ml LV SV MOD A4C:     268.0 ml LV SV MOD BP:      60.7 ml RIGHT VENTRICLE RV Basal diam:  5.25 cm RV S prime:     11.50 cm/s TAPSE (M-mode): 4.3 cm LEFT ATRIUM              Index       RIGHT ATRIUM           Index LA diam:        4.50 cm  1.93 cm/m  RA Area:     26.60 cm LA Vol (A2C):   101.0 ml 43.37 ml/m RA Volume:   101.00 ml 43.37 ml/m LA Vol (A4C):   106.0 ml 45.52 ml/m LA Biplane Vol: 103.0 ml 44.23 ml/m  AORTIC VALVE                   PULMONIC VALVE AV Area (Vmax):    1.34 cm    PV Vmax:        0.67 m/s AV Area (Vmean):   1.40 cm    PV Peak grad:   1.8 mmHg AV Area (VTI):     1.54 cm    RVOT Peak grad: 1 mmHg AV Vmax:           127.33 cm/s AV Vmean:          91.533 cm/s AV VTI:            0.211 m AV Peak Grad:      6.5 mmHg AV Mean Grad:      4.0 mmHg LVOT Vmax:         45.00 cm/s LVOT Vmean:        33.600 cm/s LVOT VTI:          0.085 m LVOT/AV VTI ratio: 0.40  AORTA Ao Root diam: 3.20 cm MITRAL VALVE                TRICUSPID VALVE MV Area (PHT): 7.59 cm     TR Peak grad:   26.8 mmHg MV Decel Time: 100 msec     TR Vmax:        259.00 cm/s MV E velocity: 102.00 cm/s MV A velocity: 88.30 cm/s   SHUNTS MV E/A  ratio:  1.16         Systemic VTI:  0.09 m                             Systemic Diam: 2.20 cm Kathlyn Sacramento MD Electronically signed by Kathlyn Sacramento MD Signature Date/Time: 04/07/2020/3:03:40 PM    Final    ASSESSMENT AND PLAN:  Bradleigh Delly  is a 79 y.o. male with a known history of coronary artery disease status post tree stands at [facility] in 2013, hypertension, hyperlipidemia, chronic systolic congestive heart failure with LV ejection fraction of about 35%, history of life vesicular heart block and removed tobacco abuse, type II diabetes comes to the emergency room with increasing shortness of breath, increased fatigue ability with regular walking and chest congestion with tightness on and off since last week.  1. Acute on chronic systolic congestive heart failure -Prevo[facility] Echo showed  LV EF of 35%, increased weight gain of about 8 to 9 pounds, x-ray consistent with pulmonary vascular congestion/CHF -4/26>> Echo showed EF <20%--?ICD -BNP 2725 -IV Lasix 40 mg bid--UOP 4700cc so far, sats 98% on Ra -4/26>> Reds vest reading yday-- 44%  2. Acute coronary syndrome/ non-STEMI of coronary artery disease with stents x3 in 2013 at [facility] -patient has increasing shortness of breath with chest tightness and elevated troponin of 703 -cycle troponin trending down -Dr. ENd  plans  Left and  right heart cath today -IV heparin drip, aspirin, beta blockers, statins and ACE inhibitor  3. Type II diabetes with CKD -IIIb -hold metformin in anticipation for procedure plan tomorrow -sliding scale insulin -creat stable  4. Hyperlipidemia continue statins  5. Hypertension - continue Coreg, lisinopril  6. DVT prophylaxis on full dose heparin drip   Family Communication : Ivin Booty and wife in the room Consults : [facility] cardiology-Dr Arida/End Code Status : full code DVT prophylaxis : heparin drip  Status is: Inpatient  Remains inpatient appropriate because:Ongoing diagnostic testing needed not appropriate for outpatient work up  Cardiac cath today, IV lasix for CHF   Dispo: The patient is from: Home              Anticipated d/c is to: Home              Anticipated d/c date [id number] 2 days              Patient currently is not medically stable to d/c.   TOTAL TIME TAKING CARE OF THIS PATIENT: 30 minutes.  >50% time spent on counselling and coordination of care  Note: This dictation was prepared with Dragon dictation along with smaller phrase technology. Any transcriptional errors that result from this process are unintentional.  Fritzi Mandes M.D    [facility]   CC: Primary care physician; Idelle Crouch, MDPatient ID: [name], male   DOB: [birth year], 79 y.o.   MRN: [id number]

## 2020-04-08 NOTE — Progress Notes (Signed)
Progress Note  Patient Name: Daniel Luna Date of Encounter: 04/08/2020  Primary Cardiologist: Ida Rogue, MD   Subjective   Still some shortness of breath.  No chest pain, palpitations, lightheadedness, or edema.  Inpatient Medications    Scheduled Meds: . aspirin EC  81 mg Oral Daily  . [START ON 04/09/2020] atorvastatin  80 mg Oral Daily  . carvedilol  12.5 mg Oral BID  . diphenhydrAMINE      . furosemide  60 mg Intravenous BID  . insulin aspart  0-5 Units Subcutaneous QHS  . insulin aspart  0-9 Units Subcutaneous TID WC  . losartan  100 mg Oral Daily  . methylPREDNISolone sodium succinate      . potassium chloride SA  20 mEq Oral Daily  . sertraline  50 mg Oral Daily  . sodium chloride flush  3 mL Intravenous Q12H  . white petrolatum       Continuous Infusions: . sodium chloride    . heparin 1,600 Units/hr (04/08/20 1841)   PRN Meds: sodium chloride, acetaminophen, hydrALAZINE, nitroGLYCERIN, ondansetron (ZOFRAN) IV, sodium chloride flush   Vital Signs    Vitals:   04/08/20 1645 04/08/20 1715 04/08/20 1729 04/08/20 1929  BP:  133/60 (!) 149/90 (!) 119/98  Pulse: 75 81 83 83  Resp: 20 (!) 22    Temp:    97.9 F (36.6 C)  TempSrc:    Oral  SpO2: 94% 95% 97% 97%  Weight:      Height:        Intake/Output Summary (Last 24 hours) at 04/08/2020 2012 Last data filed at 04/08/2020 1830 Gross per 24 hour  Intake 386.44 ml  Output 1450 ml  Net -1063.56 ml   Last 3 Weights 04/08/2020 04/08/2020 04/07/2020  Weight (lbs) 220 lb 7.4 oz 220 lb 11.2 oz 230 lb  Weight (kg) 100 kg 100.109 kg 104.327 kg      Telemetry    Sinus rhythm - Personally Reviewed  ECG    No new tracing  Physical Exam   GEN: No acute distress.   Neck: No JVD Cardiac: RRR, no murmurs, rubs, or gallops.  Respiratory: Clear to auscultation bilaterally. GI: Soft, nontender, non-distended  MS: No edema; No deformity. Neuro:  Nonfocal  Psych: Normal affect   Labs    High  Sensitivity Troponin:   Recent Labs  Lab 04/06/20 1617 04/06/20 1721 04/06/20 2018 04/06/20 2237  TROPONINIHS 702* 714* 630* 503*      Chemistry Recent Labs  Lab 04/06/20 1617 04/07/20 0239 04/08/20 0634  NA 138 140 140  K 5.3* 4.2 3.9  CL 105 106 104  CO2 24 26 26   GLUCOSE 180* 145* 171*  BUN 44* 39* 39*  CREATININE 1.71* 1.60* 1.56*  CALCIUM 9.3 9.3 9.4  GFRNONAA 38* 41* 43*  GFRAA 44* 48* 49*  ANIONGAP 9 8 10      Hematology Recent Labs  Lab 04/06/20 1617 04/08/20 0634  WBC 6.4 6.2  RBC 3.37* 3.52*  HGB 11.0* 11.4*  HCT 33.5* 34.9*  MCV 99.4 99.1  MCH 32.6 32.4  MCHC 32.8 32.7  RDW 13.2 13.1  PLT 193 205    BNP Recent Labs  Lab 04/06/20 1716  BNP 2,725.0*     DDimer No results for input(s): DDIMER in the last 168 hours.   Radiology    CARDIAC CATHETERIZATION  Result Date: 04/08/2020 Conclusions: 1. Severe multivessel coronary artery disease, including 95% distal LMCA stenosis, 70% mid LAD focal in-stent restenosis, 99%  ostial LCx stenosis, and 70% mid RCA disease. 2. Moderately to severely elevated left heart filling pressures. 3. Moderately elevated right heart and pulmonary artery pressures. 4. Normal Fick cardiac output/index. Recommendations: 1. Transfer to tertiary care center for cardiac surgery consultation for CABG. 2. Escalate diuresis and optimize evidence-based heart failure therapy as renal function and blood pressure allow. 3. Restart heparin infusion 2 hours after deflation of TR band. Nelva Bush, MD [facility]   ECHOCARDIOGRAM COMPLETE  Result Date: 04/07/2020    ECHOCARDIOGRAM REPORT   Patient Name:   Daniel Luna Date of Exam: 04/07/2020 Medical Rec #:  [id number]    Height:       75.0 in Accession #:    [id number]   Weight:       230.0 lb Date of Birth:  09-19-1941     BSA:          2.329 m Patient Age:    79 years     BP:           117/80 mmHg Patient Gender: M            HR:           73 bpm. Exam Location:  [facility] Procedure: 2D  Echo, Cardiac Doppler and Color Doppler Indications:     Acute myocardial infarction 410  History:         Patient has no prior history of Echocardiogram examinations.                  CHF, Signs/Symptoms:Shortness of Breath and Dyspnea; Risk                  Factors:Hypertension and Diabetes. Ischemic cardiomyopathy.  Sonographer:     Sherrie Sport RDCS (AE) Referring Phys:  [place] Diagnosing Phys: Kathlyn Sacramento MD IMPRESSIONS  1. Left ventricular ejection fraction, by estimation, is 15-20%. The left ventricle has severely decreased function. The left ventricular internal cavity size was moderately dilated. There is moderate left ventricular hypertrophy. Left ventricular diastolic parameters are consistent with Grade II diastolic dysfunction (pseudonormalization).  2. Right ventricular systolic function is mildly reduced. The right ventricular size is normal. There is mildly elevated pulmonary artery systolic pressure.  3. Left atrial size was moderately dilated.  4. Right atrial size was moderately dilated.  5. Moderate pleural effusion in the left lateral region.  6. The mitral valve is normal in structure. Moderate mitral valve regurgitation. No evidence of mitral stenosis.  7. The aortic valve is normal in structure. Aortic valve regurgitation is not visualized. Mild to moderate aortic valve sclerosis/calcification is present, without any evidence of aortic stenosis. FINDINGS  Left Ventricle: Left ventricular ejection fraction, by estimation, is <20%. The left ventricle has severely decreased function. The left ventricle demonstrates regional wall motion abnormalities. The left ventricular internal cavity size was moderately dilated. There is moderate left ventricular hypertrophy. Left ventricular diastolic parameters are consistent with Grade II diastolic dysfunction (pseudonormalization). Right Ventricle: The right ventricular size is normal. No increase in right ventricular wall thickness. Right  ventricular systolic function is mildly reduced. There is mildly elevated pulmonary artery systolic pressure. The tricuspid regurgitant velocity  is 2.59 m/s, and with an assumed right atrial pressure of 10 mmHg, the estimated right ventricular systolic pressure is Q000111Q mmHg. Left Atrium: Left atrial size was moderately dilated. Right Atrium: Right atrial size was moderately dilated. Pericardium: There is no evidence of pericardial effusion. Mitral Valve: The mitral valve is normal  in structure. There is mild thickening of the mitral valve leaflet(s). Normal mobility of the mitral valve leaflets. Moderate mitral valve regurgitation. No evidence of mitral valve stenosis. Tricuspid Valve: The tricuspid valve is normal in structure. Tricuspid valve regurgitation is trivial. No evidence of tricuspid stenosis. Aortic Valve: The aortic valve is normal in structure. Aortic valve regurgitation is not visualized. Mild to moderate aortic valve sclerosis/calcification is present, without any evidence of aortic stenosis. Aortic valve mean gradient measures 4.0 mmHg. Aortic valve peak gradient measures 6.5 mmHg. Aortic valve area, by VTI measures 1.54 cm. Pulmonic Valve: The pulmonic valve was normal in structure. Pulmonic valve regurgitation is not visualized. No evidence of pulmonic stenosis. Aorta: The aortic root is normal in size and structure. Venous: The inferior vena cava was not well visualized. IAS/Shunts: No atrial level shunt detected by color flow Doppler. Additional Comments: There is a moderate pleural effusion in the left lateral region.  LEFT VENTRICLE PLAX 2D LVIDd:         6.54 cm      Diastology LVIDs:         5.90 cm      LV e' lateral:   8.70 cm/s LV PW:         1.03 cm      LV E/e' lateral: 11.7 LV IVS:        1.23 cm      LV e' medial:    4.57 cm/s LVOT diam:     2.20 cm      LV E/e' medial:  22.3 LV SV:         32 LV SV Index:   14 LVOT Area:     3.80 cm  LV Volumes (MOD) LV vol d, MOD A2C: 234.0 ml LV  vol d, MOD A4C: 268.0 ml LV vol s, MOD A2C: 185.0 ml LV vol s, MOD A4C: 204.0 ml LV SV MOD A2C:     49.0 ml LV SV MOD A4C:     268.0 ml LV SV MOD BP:      60.7 ml RIGHT VENTRICLE RV Basal diam:  5.25 cm RV S prime:     11.50 cm/s TAPSE (M-mode): 4.3 cm LEFT ATRIUM              Index       RIGHT ATRIUM           Index LA diam:        4.50 cm  1.93 cm/m  RA Area:     26.60 cm LA Vol (A2C):   101.0 ml 43.37 ml/m RA Volume:   101.00 ml 43.37 ml/m LA Vol (A4C):   106.0 ml 45.52 ml/m LA Biplane Vol: 103.0 ml 44.23 ml/m  AORTIC VALVE                   PULMONIC VALVE AV Area (Vmax):    1.34 cm    PV Vmax:        0.67 m/s AV Area (Vmean):   1.40 cm    PV Peak grad:   1.8 mmHg AV Area (VTI):     1.54 cm    RVOT Peak grad: 1 mmHg AV Vmax:           127.33 cm/s AV Vmean:          91.533 cm/s AV VTI:            0.211 m AV Peak Grad:      6.5 mmHg  AV Mean Grad:      4.0 mmHg LVOT Vmax:         45.00 cm/s LVOT Vmean:        33.600 cm/s LVOT VTI:          0.085 m LVOT/AV VTI ratio: 0.40  AORTA Ao Root diam: 3.20 cm MITRAL VALVE                TRICUSPID VALVE MV Area (PHT): 7.59 cm     TR Peak grad:   26.8 mmHg MV Decel Time: 100 msec     TR Vmax:        259.00 cm/s MV E velocity: 102.00 cm/s MV A velocity: 88.30 cm/s   SHUNTS MV E/A ratio:  1.16         Systemic VTI:  0.09 m                             Systemic Diam: 2.20 cm Kathlyn Sacramento MD Electronically signed by Kathlyn Sacramento MD Signature Date/Time: 04/07/2020/3:03:40 PM    Final     Cardiac Studies   See cath/echo, above  Patient Profile     79 y.o. male with history of CAD s/p two-vessel PCI in 0000000, chronic systolic heart failure, hypertension, hyperlipidemia, and type 2 diabetes mellitus, admitted with acute on chronic systolic heart failure and NSTEMI.  Assessment & Plan    Acute on chronic systolic heart failure: Patient reports improvement in symptoms, though L/RHC notable for at least moderately elevated left and right heart filling  pressures.  Increase furosemide to 60 mg BID.  Continue current dose of carvedilol.  Consider adding ACEI/ARB as blood pressure and renal funciton allows.  NSTEMI: Cath shows severe 3-vessel CAD, including 95% distal LMCA stenosis extending into ostial LCx, 70% mid LAD ISR, and 70% mid RCA lesion.  Restart heparin infusion.  Continue aspirin.  Increase atorvastatin to 80 mg daily.  Transfer to tertiary care center for cardiac surgery evaluation for CABG.  Patient is still debating between [facility]; he will inform us of his decision tomorrow.  He will need further HF optimization before revascularization can be pursued.  Chronic kidney disease:  Creatinine stable.  Avoid nephrotoxic agents.  Diurese, as outlined above.    Approximately 40 minutes were spent on this encounter, of which > 50% was spent counseling the patient and his wife regarding his severe CAD and HF and treatment options going forward.  For questions or updates, please contact [place] Please consult [email or web address] for contact info under [facility].     Signed, Nelva Bush, MD  04/08/2020, 8:12 PM

## 2020-04-08 NOTE — Consult Note (Signed)
[place] for Heparin Infusion Indication: chest pain/ACS  Allergies  Allergen Reactions  . Iodinated Diagnostic Agents Hives    Patient Measurements: Height: 6\' 3"  (190.5 cm) Weight: 100.1 kg (220 lb 11.2 oz) IBW/kg (Calculated) : 84.5 Heparin Dosing Weight: 100.1 kg  Vital Signs: Temp: 97.6 F (36.4 C) (04/27 0531) Temp Source: Oral (04/27 0531) BP: 139/96 (04/27 0531) Pulse Rate: 82 (04/27 0531)  Labs: Recent Labs    04/06/20 1617 04/06/20 1617 04/06/20 1721 04/06/20 2018 04/06/20 2237 04/07/20 0239 04/07/20 1048 04/08/20 0634  HGB 11.0*  --   --   --   --   --   --  11.4*  HCT 33.5*  --   --   --   --   --   --  34.9*  PLT 193  --   --   --   --   --   --  205  APTT  --   --  36  --   --   --   --   --   LABPROT  --   --  14.5  --   --   --   --   --   INR  --   --  1.1  --   --   --   --   --   HEPARINUNFRC  --   --   --   --   --  0.64 0.57 0.58  CREATININE 1.71*  --   --   --   --  1.60*  --   --   TROPONINIHS 702*   < > 714* 630* 503*  --   --   --    < > = values in this interval not displayed.    Estimated Creatinine Clearance: 46.9 mL/min (A) (by C-G formula based on SCr of 1.6 mg/dL (H)).   Medical History: Past Medical History:  Diagnosis Date  . Arthritis   . CHF (congestive heart failure) ([place])   . Coronary artery disease    CAD s/p PCI to LAD and RCA 01/2012   . Cyst of brain 1983  . Diabetes mellitus without complication ([place])   . Dysrhythmia   . Heart failure ([place])   . Hyperlipidemia   . Hypertension   . Ischemic cardiomyopathy    LVEF 123XX123 % (systolic and diastolic, NYHA I-II symptoms)   . Macular degeneration   . Shortness of breath dyspnea     Medications:  Medications Prior to Admission  Medication Sig Dispense Refill Last Dose  . carvedilol (COREG) 12.5 MG tablet Take 1 tablet (12.5 mg total) by mouth 2 (two) times daily. 180 tablet 2 04/06/2020 at 0730  . furosemide (LASIX) 20 MG  tablet Take 1 tablet (20 mg total) by mouth daily. 90 tablet 3 04/06/2020 at 0730  . lisinopril (PRINIVIL,ZESTRIL) 40 MG tablet Take 1 tablet (40 mg total) by mouth daily. 90 tablet 3 04/06/2020 at 0730  . meloxicam (MOBIC) 15 MG tablet Take 15 mg by mouth at bedtime.    04/05/2020 at Unknown time  . metFORMIN (GLUCOPHAGE) 1000 MG tablet Take 1,000 mg by mouth 2 (two) times daily with a meal.   04/06/2020 at 0730  . potassium chloride SA (KLOR-CON) 20 MEQ tablet Take 1 tablet (20 mEq total) by mouth daily. 30 tablet 6 04/06/2020 at 0730  . sertraline (ZOLOFT) 50 MG tablet Take 50 mg by mouth daily.   04/06/2020 at 0730  .  simvastatin (ZOCOR) 80 MG tablet Take 1 tablet (80 mg total) by mouth at bedtime. 90 tablet 3 04/05/2020 at Unknown time   Scheduled:  . aspirin EC  81 mg Oral Daily  . atorvastatin  40 mg Oral Daily  . carvedilol  12.5 mg Oral BID  . furosemide  40 mg Intravenous BID  . insulin aspart  0-5 Units Subcutaneous QHS  . insulin aspart  0-9 Units Subcutaneous TID WC  . losartan  100 mg Oral Daily  . potassium chloride SA  20 mEq Oral Daily  . sertraline  50 mg Oral Daily  . sodium chloride flush  3 mL Intravenous Q12H   Infusions:  . sodium chloride    . sodium chloride 10 mL/hr at 04/08/20 0647  . heparin 1,600 Units/hr (04/08/20 0647)   PRN:  Anti-infectives (From admission, onward)   None      Assessment: Pharmacy has been consulted to initiate Heparin Infusion in 79yo male with known history of CAD and CHF presenting to the ED with increased shortness of breath with wheezing. No prior history of PTA anticoagulant use. Baseline labs: aPTT 36sec, INR 1.1, Hgb 11.0, PLTs 193.    Goal of Therapy:  Heparin level 0.3-0.7 units/ml Monitor platelets by anticoagulation protocol: Yes   Plan:  Heparin level therapeutic x 2 . Will continue current rate and will recheck heparin level and CBC with AM labs. Plan for cath today.    Oswald Hillock, PharmD, BCPS Clinical  Pharmacist 04/08/2020,7:37 AM

## 2020-04-08 NOTE — Progress Notes (Signed)
Dr. Saunders Revel at bedside, speaking with pt. And his spouse again re: cath results and plans for pt. Both verbalized understanding of conversation.

## 2020-04-08 NOTE — Consult Note (Signed)
[place] for Heparin Infusion Indication: chest pain/ACS  Allergies  Allergen Reactions  . Iodinated Diagnostic Agents Hives    Patient Measurements: Height: 6\' 3"  (190.5 cm) Weight: 100 kg (220 lb 7.4 oz) IBW/kg (Calculated) : 84.5 Heparin Dosing Weight: 100.1 kg  Vital Signs: Temp: 98.3 F (36.8 C) (04/27 1053) Temp Source: Oral (04/27 1053) BP: 112/63 (04/27 1615) Pulse Rate: 76 (04/27 1615)  Labs: Recent Labs    04/06/20 1617 04/06/20 1617 04/06/20 1721 04/06/20 2018 04/06/20 2237 04/07/20 0239 04/07/20 1048 04/08/20 0634  HGB 11.0*  --   --   --   --   --   --  11.4*  HCT 33.5*  --   --   --   --   --   --  34.9*  PLT 193  --   --   --   --   --   --  205  APTT  --   --  36  --   --   --   --   --   LABPROT  --   --  14.5  --   --   --   --   --   INR  --   --  1.1  --   --   --   --   --   HEPARINUNFRC  --   --   --   --   --  0.64 0.57 0.58  CREATININE 1.71*  --   --   --   --  1.60*  --  1.56*  TROPONINIHS 702*   < > 714* 630* 503*  --   --   --    < > = values in this interval not displayed.    Estimated Creatinine Clearance: 48.1 mL/min (A) (by C-G formula based on SCr of 1.56 mg/dL (H)).   Medical History: Past Medical History:  Diagnosis Date  . Arthritis   . CHF (congestive heart failure) ([place])   . Coronary artery disease    CAD s/p PCI to LAD and RCA 01/2012   . Cyst of brain 1983  . Diabetes mellitus without complication ([place])   . Dysrhythmia   . Heart failure ([place])   . Hyperlipidemia   . Hypertension   . Ischemic cardiomyopathy    LVEF 123XX123 % (systolic and diastolic, NYHA I-II symptoms)   . Macular degeneration   . Shortness of breath dyspnea      Assessment: Pharmacy has been consulted to initiate Heparin Infusion in 79yo male with known history of CAD and CHF presenting to the ED with increased shortness of breath with wheezing. No prior history of PTA anticoagulant use. Baseline labs: aPTT  36sec, INR 1.1, Hgb 11.0, PLTs 193.    Goal of Therapy:  Heparin level 0.3-0.7 units/ml Monitor platelets by anticoagulation protocol: Yes   Plan:  Consult to restart heparin 2 hours after TR band removal. Spoke with RN in [place]. Heparin to RESTART at ~ 1845, will order heparin 1600 units/hr to start at 1900. Patient previously therapeutic on this rate. HL 4/28 at 0400. CBC daily.    Tawnya Crook, PharmD Clinical Pharmacist 04/08/2020,4:30 PM

## 2020-04-09 ENCOUNTER — Encounter: Payer: Self-pay | Admitting: Cardiology

## 2020-04-09 DIAGNOSIS — I5023 Acute on chronic systolic (congestive) heart failure: Secondary | ICD-10-CM | POA: Diagnosis not present

## 2020-04-09 DIAGNOSIS — I214 Non-ST elevation (NSTEMI) myocardial infarction: Secondary | ICD-10-CM | POA: Diagnosis not present

## 2020-04-09 LAB — GLUCOSE, CAPILLARY
Glucose-Capillary: 193 mg/dL — ABNORMAL HIGH (ref 70–99)
Glucose-Capillary: 183 mg/dL — ABNORMAL HIGH (ref 70–99)
Glucose-Capillary: 187 mg/dL — ABNORMAL HIGH (ref 70–99)
Glucose-Capillary: 195 mg/dL — ABNORMAL HIGH (ref 70–99)

## 2020-04-09 LAB — CBC
HCT: 35.1 % — ABNORMAL LOW (ref 39.0–52.0)
Hemoglobin: 11.7 g/dL — ABNORMAL LOW (ref 13.0–17.0)
MCH: 33.1 pg (ref 26.0–34.0)
MCHC: 33.3 g/dL (ref 30.0–36.0)
MCV: 99.2 fL (ref 80.0–100.0)
Platelets: 241 10*3/uL (ref 150–400)
RBC: 3.54 MIL/uL — ABNORMAL LOW (ref 4.22–5.81)
RDW: 12.9 % (ref 11.5–15.5)
WBC: 10.3 10*3/uL (ref 4.0–10.5)
nRBC: 0 % (ref 0.0–0.2)

## 2020-04-09 LAB — BASIC METABOLIC PANEL
Anion gap: 9 (ref 5–15)
BUN: 47 mg/dL — ABNORMAL HIGH (ref 8–23)
CO2: 29 mmol/L (ref 22–32)
Calcium: 9.5 mg/dL (ref 8.9–10.3)
Chloride: 101 mmol/L (ref 98–111)
Creatinine, Ser: 1.77 mg/dL — ABNORMAL HIGH (ref 0.61–1.24)
GFR calc Af Amer: 42 mL/min — ABNORMAL LOW (ref >60)
GFR calc non Af Amer: 36 mL/min — ABNORMAL LOW (ref >60)
Glucose, Bld: 210 mg/dL — ABNORMAL HIGH (ref 70–99)
Potassium: 4.2 mmol/L (ref 3.5–5.1)
Sodium: 139 mmol/L (ref 135–145)

## 2020-04-09 LAB — HEPARIN LEVEL (UNFRACTIONATED): Heparin Unfractionated: 0.55 IU/mL (ref 0.30–0.70)

## 2020-04-09 MED ORDER — INSULIN ASPART 100 UNIT/ML ~~LOC~~ SOLN: 0.0000 [IU] | Freq: Every day | SUBCUTANEOUS | 11 refills | Status: AC

## 2020-04-09 MED ORDER — FUROSEMIDE 10 MG/ML IJ SOLN
20.0000 mg | Freq: Two times a day (BID) | INTRAMUSCULAR | Status: DC
  Administered 2020-04-09 – 2020-04-10 (×2): 20 mg via INTRAVENOUS
  Filled 2020-04-09 (×2): qty 2

## 2020-04-09 MED ORDER — INSULIN ASPART 100 UNIT/ML ~~LOC~~ SOLN: 0.0000 [IU] | Freq: Three times a day (TID) | SUBCUTANEOUS | 11 refills | Status: DC

## 2020-04-09 MED ORDER — ATORVASTATIN CALCIUM 80 MG PO TABS: 80.0000 mg | ORAL_TABLET | Freq: Every day | ORAL | Status: AC

## 2020-04-09 MED ORDER — HEPARIN (PORCINE) 25000 UT/250ML-% IV SOLN: 1600.0000 [IU]/h | INTRAVENOUS | Status: DC

## 2020-04-09 MED ORDER — LOSARTAN POTASSIUM 100 MG PO TABS: 100.0000 mg | ORAL_TABLET | Freq: Every day | ORAL | Status: DC

## 2020-04-09 MED ORDER — ASPIRIN 81 MG PO TBEC: 81.0000 mg | DELAYED_RELEASE_TABLET | Freq: Every day | ORAL | Status: AC

## 2020-04-09 MED ORDER — ACETAMINOPHEN 325 MG PO TABS: 650.0000 mg | ORAL_TABLET | ORAL | Status: AC | PRN

## 2020-04-09 MED ORDER — NITROGLYCERIN 0.4 MG SL SUBL: 0.4000 mg | SUBLINGUAL_TABLET | SUBLINGUAL | 12 refills | Status: DC | PRN

## 2020-04-09 MED ORDER — FUROSEMIDE 10 MG/ML IJ SOLN: 20.0000 mg | Freq: Two times a day (BID) | INTRAMUSCULAR | 0 refills | Status: DC

## 2020-04-09 NOTE — Care Management Important Message (Signed)
Important Message  Patient Details  Name: Daniel Luna MRN: [id number] Date of Birth: Dec 18, 1940   Medicare Important Message Given:  Yes     [name] 04/09/2020, 11:10 AM

## 2020-04-09 NOTE — Progress Notes (Addendum)
Progress Note  Patient Name: Daniel Luna Date of Encounter: 04/09/2020  Primary Cardiologist: Ida Rogue, MD   Subjective   Patient states doing okay.  Denies chest pain or shortness of breath.  Patient wants to do some research as to what hospital to be transferred to.  He is between [facility] or [facility].  Inpatient Medications    Scheduled Meds: . aspirin EC  81 mg Oral Daily  . atorvastatin  80 mg Oral Daily  . carvedilol  12.5 mg Oral BID  . furosemide  60 mg Intravenous BID  . insulin aspart  0-5 Units Subcutaneous QHS  . insulin aspart  0-9 Units Subcutaneous TID WC  . losartan  100 mg Oral Daily  . potassium chloride SA  20 mEq Oral Daily  . sertraline  50 mg Oral Daily  . sodium chloride flush  3 mL Intravenous Q12H   Continuous Infusions: . sodium chloride    . heparin 1,600 Units/hr (04/09/20 0413)   PRN Meds: sodium chloride, acetaminophen, nitroGLYCERIN, ondansetron (ZOFRAN) IV, sodium chloride flush   Vital Signs    Vitals:   04/08/20 1929 04/09/20 0454 04/09/20 0600 04/09/20 0730  BP: (!) 119/98 120/77  119/78  Pulse: 83 73  79  Resp:   13 16  Temp: 97.9 F (36.6 C) 98 F (36.7 C)  97.6 F (36.4 C)  TempSrc: Oral Oral  Oral  SpO2: 97% 97%  98%  Weight:  99.6 kg    Height:        Intake/Output Summary (Last 24 hours) at 04/09/2020 1049 Last data filed at 04/09/2020 0713 Gross per 24 hour  Intake 391.68 ml  Output 950 ml  Net -558.32 ml   Last 3 Weights 04/09/2020 04/08/2020 04/08/2020  Weight (lbs) 219 lb 8 oz 220 lb 7.4 oz 220 lb 11.2 oz  Weight (kg) 99.565 kg 100 kg 100.109 kg      Telemetry    Sinus rhythm, 85- Personally Reviewed  ECG    New tracing obtained- Personally Reviewed  Physical Exam   GEN: No acute distress.   Neck: No JVD Cardiac: RRR, no murmurs, rubs, or gallops.  Respiratory: Clear to auscultation bilaterally. GI: Soft, nontender, non-distended  MS: No edema; No deformity. Neuro:  Nonfocal   Psych: Normal affect   Labs    High Sensitivity Troponin:   Recent Labs  Lab 04/06/20 1617 04/06/20 1721 04/06/20 2018 04/06/20 2237  TROPONINIHS 702* 714* 630* 503*      Chemistry Recent Labs  Lab 04/07/20 0239 04/08/20 0634 04/09/20 0440  NA 140 140 139  K 4.2 3.9 4.2  CL 106 104 101  CO2 26 26 29   GLUCOSE 145* 171* 210*  BUN 39* 39* 47*  CREATININE 1.60* 1.56* 1.77*  CALCIUM 9.3 9.4 9.5  GFRNONAA 41* 43* 36*  GFRAA 48* 49* 42*  ANIONGAP 8 10 9      Hematology Recent Labs  Lab 04/06/20 1617 04/08/20 0634 04/09/20 0440  WBC 6.4 6.2 10.3  RBC 3.37* 3.52* 3.54*  HGB 11.0* 11.4* 11.7*  HCT 33.5* 34.9* 35.1*  MCV 99.4 99.1 99.2  MCH 32.6 32.4 33.1  MCHC 32.8 32.7 33.3  RDW 13.2 13.1 12.9  PLT 193 205 241    BNP Recent Labs  Lab 04/06/20 1716  BNP 2,725.0*     DDimer No results for input(s): DDIMER in the last 168 hours.   Radiology    CARDIAC CATHETERIZATION  Result Date: 04/08/2020 Conclusions: 1. Severe  multivessel coronary artery disease, including 95% distal LMCA stenosis, 70% mid LAD focal in-stent restenosis, 99% ostial LCx stenosis, and 70% mid RCA disease. 2. Moderately to severely elevated left heart filling pressures. 3. Moderately elevated right heart and pulmonary artery pressures. 4. Normal Fick cardiac output/index. Recommendations: 1. Transfer to tertiary care center for cardiac surgery consultation for CABG. 2. Escalate diuresis and optimize evidence-based heart failure therapy as renal function and blood pressure allow. 3. Restart heparin infusion 2 hours after deflation of TR band. Nelva Bush, MD [facility]   ECHOCARDIOGRAM COMPLETE  Result Date: 04/07/2020    ECHOCARDIOGRAM REPORT   Patient Name:   [name] Date of Exam: 04/07/2020 Medical Rec #:  [id number]    Height:       75.0 in Accession #:    [id number]   Weight:       230.0 lb Date of Birth:  1941/01/13     BSA:          2.329 m Patient Age:    79 years     BP:            117/80 mmHg Patient Gender: M            HR:           73 bpm. Exam Location:  [facility] Procedure: 2D Echo, Cardiac Doppler and Color Doppler Indications:     Acute myocardial infarction 410  History:         Patient has no prior history of Echocardiogram examinations.                  CHF, Signs/Symptoms:Shortness of Breath and Dyspnea; Risk                  Factors:Hypertension and Diabetes. Ischemic cardiomyopathy.  Sonographer:     Sherrie Sport RDCS (AE) Referring Phys:  [place] Diagnosing Phys: Kathlyn Sacramento MD IMPRESSIONS  1. Left ventricular ejection fraction, by estimation, is 15-20%. The left ventricle has severely decreased function. The left ventricular internal cavity size was moderately dilated. There is moderate left ventricular hypertrophy. Left ventricular diastolic parameters are consistent with Grade II diastolic dysfunction (pseudonormalization).  2. Right ventricular systolic function is mildly reduced. The right ventricular size is normal. There is mildly elevated pulmonary artery systolic pressure.  3. Left atrial size was moderately dilated.  4. Right atrial size was moderately dilated.  5. Moderate pleural effusion in the left lateral region.  6. The mitral valve is normal in structure. Moderate mitral valve regurgitation. No evidence of mitral stenosis.  7. The aortic valve is normal in structure. Aortic valve regurgitation is not visualized. Mild to moderate aortic valve sclerosis/calcification is present, without any evidence of aortic stenosis. FINDINGS  Left Ventricle: Left ventricular ejection fraction, by estimation, is <20%. The left ventricle has severely decreased function. The left ventricle demonstrates regional wall motion abnormalities. The left ventricular internal cavity size was moderately dilated. There is moderate left ventricular hypertrophy. Left ventricular diastolic parameters are consistent with Grade II diastolic dysfunction (pseudonormalization). Right  Ventricle: The right ventricular size is normal. No increase in right ventricular wall thickness. Right ventricular systolic function is mildly reduced. There is mildly elevated pulmonary artery systolic pressure. The tricuspid regurgitant velocity  is 2.59 m/s, and with an assumed right atrial pressure of 10 mmHg, the estimated right ventricular systolic pressure is [id number] mmHg. Left Atrium: Left atrial size was moderately dilated. Right Atrium: Right atrial size was moderately  dilated. Pericardium: There is no evidence of pericardial effusion. Mitral Valve: The mitral valve is normal in structure. There is mild thickening of the mitral valve leaflet(s). Normal mobility of the mitral valve leaflets. Moderate mitral valve regurgitation. No evidence of mitral valve stenosis. Tricuspid Valve: The tricuspid valve is normal in structure. Tricuspid valve regurgitation is trivial. No evidence of tricuspid stenosis. Aortic Valve: The aortic valve is normal in structure. Aortic valve regurgitation is not visualized. Mild to moderate aortic valve sclerosis/calcification is present, without any evidence of aortic stenosis. Aortic valve mean gradient measures 4.0 mmHg. Aortic valve peak gradient measures 6.5 mmHg. Aortic valve area, by VTI measures 1.54 cm. Pulmonic Valve: The pulmonic valve was normal in structure. Pulmonic valve regurgitation is not visualized. No evidence of pulmonic stenosis. Aorta: The aortic root is normal in size and structure. Venous: The inferior vena cava was not well visualized. IAS/Shunts: No atrial level shunt detected by color flow Doppler. Additional Comments: There is a moderate pleural effusion in the left lateral region.  LEFT VENTRICLE PLAX 2D LVIDd:         6.54 cm      Diastology LVIDs:         5.90 cm      LV e' lateral:   8.70 cm/s LV PW:         1.03 cm      LV E/e' lateral: 11.7 LV IVS:        1.23 cm      LV e' medial:    4.57 cm/s LVOT diam:     2.20 cm      LV E/e' medial:  22.3 LV  SV:         32 LV SV Index:   14 LVOT Area:     3.80 cm  LV Volumes (MOD) LV vol d, MOD A2C: 234.0 ml LV vol d, MOD A4C: 268.0 ml LV vol s, MOD A2C: 185.0 ml LV vol s, MOD A4C: 204.0 ml LV SV MOD A2C:     49.0 ml LV SV MOD A4C:     268.0 ml LV SV MOD BP:      60.7 ml RIGHT VENTRICLE RV Basal diam:  5.25 cm RV S prime:     11.50 cm/s TAPSE (M-mode): 4.3 cm LEFT ATRIUM              Index       RIGHT ATRIUM           Index LA diam:        4.50 cm  1.93 cm/m  RA Area:     26.60 cm LA Vol (A2C):   101.0 ml 43.37 ml/m RA Volume:   101.00 ml 43.37 ml/m LA Vol (A4C):   106.0 ml 45.52 ml/m LA Biplane Vol: 103.0 ml 44.23 ml/m  AORTIC VALVE                   PULMONIC VALVE AV Area (Vmax):    1.34 cm    PV Vmax:        0.67 m/s AV Area (Vmean):   1.40 cm    PV Peak grad:   1.8 mmHg AV Area (VTI):     1.54 cm    RVOT Peak grad: 1 mmHg AV Vmax:           127.33 cm/s AV Vmean:          91.533 cm/s AV VTI:  0.211 m AV Peak Grad:      6.5 mmHg AV Mean Grad:      4.0 mmHg LVOT Vmax:         45.00 cm/s LVOT Vmean:        33.600 cm/s LVOT VTI:          0.085 m LVOT/AV VTI ratio: 0.40  AORTA Ao Root diam: 3.20 cm MITRAL VALVE                TRICUSPID VALVE MV Area (PHT): 7.59 cm     TR Peak grad:   26.8 mmHg MV Decel Time: 100 msec     TR Vmax:        259.00 cm/s MV E velocity: 102.00 cm/s MV A velocity: 88.30 cm/s   SHUNTS MV E/A ratio:  1.16         Systemic VTI:  0.09 m                             Systemic Diam: 2.20 cm Kathlyn Sacramento MD Electronically signed by Kathlyn Sacramento MD Signature Date/Time: 04/07/2020/3:03:40 PM    Final     Cardiac Studies   LHC 03/2020 Conclusions: 1. Severe multivessel coronary artery disease, including 95% distal LMCA stenosis, 70% mid LAD focal in-stent restenosis, 99% ostial LCx stenosis, and 70% mid RCA disease. 2. Moderately to severely elevated left heart filling pressures. 3. Moderately elevated right heart and pulmonary artery pressures. 4. Normal Fick cardiac  output/index.  Recommendations: 1. Transfer to tertiary care center for cardiac surgery consultation for CABG. 2. Escalate diuresis and optimize evidence-based heart failure therapy as renal function and blood pressure allow. 3. Restart heparin infusion 2 hours after deflation of TR band.  TTE 03/2019 1. Left ventricular ejection fraction, by estimation, is 15-20%. The left  ventricle has severely decreased function. The left ventricular internal  cavity size was moderately dilated. There is moderate left ventricular  hypertrophy. Left ventricular  diastolic parameters are consistent with Grade II diastolic dysfunction  (pseudonormalization).  2. Right ventricular systolic function is mildly reduced. The right  ventricular size is normal. There is mildly elevated pulmonary artery  systolic pressure.  3. Left atrial size was moderately dilated.  4. Right atrial size was moderately dilated.  5. Moderate pleural effusion in the left lateral region.  6. The mitral valve is normal in structure. Moderate mitral valve  regurgitation. No evidence of mitral stenosis.  7. The aortic valve is normal in structure. Aortic valve regurgitation is  not visualized. Mild to moderate aortic valve sclerosis/calcification is  present, without any evidence of aortic stenosis.   Patient Profile     79 y.o. 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  Assessment & Plan    1.  NSTEMI, three-vessel CAD -Chest pain-free -Aspirin, heparin infusion,  -increase Lipitor to 80 mg daily -Transfer to tertiary care center for CABG evaluation.  Patient still doing research to decide on [place].  Patient encouraged to decide as soon as possible so as to initiate the transfer process as timing of  bed availability is not always guaranteed.  2.  Ischemic cardiomyopathy, EF 15 to 20% -Creatinine worsen -Patient is euvolemic.   -Decrease Lasix to 20 mg IV twice daily. -Continue  to monitor creatinine -Continue Coreg.  Holding ACE/ARB/Arni for now in light of elevated creatinine.      Signed, Kate Sable, MD  04/09/2020, 10:49 AM    Addendum Patient and wife decided on tertiary care transfer for CABG consideration.  Will like to go to [facility], under Dr. Smith Robert service if possible.

## 2020-04-09 NOTE — Plan of Care (Signed)
Nutrition Education Note  RD consulted for nutrition education regarding a Heart Healthy diet.   79 y.o. 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  Met with patient and pt's wife in room today. Pt reports good appetite and oral intake pta and today.   Lipid Panel     Component Value Date/Time   CHOL 127 04/06/2020 2018   TRIG 83 04/06/2020 2018   HDL 29 (L) 04/06/2020 2018   CHOLHDL 4.4 04/06/2020 2018   VLDL 17 04/06/2020 2018   [place] 81 04/06/2020 2018    RD provided "Heart Healthy Nutrition Therapy" handout from the Academy of [employer]. Reviewed patient's dietary recall. Provided examples on ways to decrease sodium and fat intake in diet. Discouraged intake of processed foods and use of salt shaker. Encouraged fresh fruits and vegetables as well as whole grain sources of carbohydrates to maximize fiber intake. Teach back method used.  Expect good compliance.  Body mass index is 27.44 kg/m. Pt meets criteria for overweight based on current BMI.  Current diet order is HH/CHO, patient is consuming approximately 100% of meals at this time. Labs and medications reviewed. No further nutrition interventions warranted at this time. RD contact information provided. If additional nutrition issues arise, please re-consult RD.  Koleen Distance MS, RD, LDN Please refer to [facility] for RD and/or RD on-call/weekend/after hours pager

## 2020-04-09 NOTE — Consult Note (Signed)
[place] for Heparin Infusion Indication: chest pain/ACS  Allergies  Allergen Reactions  . Iodinated Diagnostic Agents Hives    Patient Measurements: Height: 6\' 3"  (190.5 cm) Weight: 99.6 kg (219 lb 8 oz) IBW/kg (Calculated) : 84.5 Heparin Dosing Weight: 100.1 kg  Vital Signs: Temp: 98 F (36.7 C) (04/28 0454) Temp Source: Oral (04/28 0454) BP: 120/77 (04/28 0454) Pulse Rate: 73 (04/28 0454)  Labs: Recent Labs    04/06/20 1617 04/06/20 1617 04/06/20 1721 04/06/20 2018 04/06/20 2237 04/07/20 0239 04/07/20 0239 04/07/20 1048 04/08/20 0634 04/09/20 0440  HGB 11.0*   < >  --   --   --   --   --   --  11.4* 11.7*  HCT 33.5*  --   --   --   --   --   --   --  34.9* 35.1*  PLT 193  --   --   --   --   --   --   --  205 241  APTT  --   --  36  --   --   --   --   --   --   --   LABPROT  --   --  14.5  --   --   --   --   --   --   --   INR  --   --  1.1  --   --   --   --   --   --   --   HEPARINUNFRC  --   --   --   --   --  0.64   < > 0.57 0.58 0.55  CREATININE 1.71*   < >  --   --   --  1.60*  --   --  1.56* 1.77*  TROPONINIHS 702*   < > 714* 630* 503*  --   --   --   --   --    < > = values in this interval not displayed.    Estimated Creatinine Clearance: 42.4 mL/min (A) (by C-G formula based on SCr of 1.77 mg/dL (H)).   Medical History: Past Medical History:  Diagnosis Date  . Arthritis   . CHF (congestive heart failure) ([place])   . Coronary artery disease    CAD s/p PCI to LAD and RCA 01/2012   . Cyst of brain 1983  . Diabetes mellitus without complication ([place])   . Dysrhythmia   . Heart failure ([place])   . Hyperlipidemia   . Hypertension   . Ischemic cardiomyopathy    LVEF 123XX123 % (systolic and diastolic, NYHA I-II symptoms)   . Macular degeneration   . Shortness of breath dyspnea      Assessment: Pharmacy has been consulted to initiate Heparin Infusion in 79yo male with known history of CAD and CHF presenting  to the ED with increased shortness of breath with wheezing. No prior history of PTA anticoagulant use. Baseline labs: aPTT 36sec, INR 1.1, Hgb 11.0, PLTs 193.    Goal of Therapy:  Heparin level 0.3-0.7 units/ml Monitor platelets by anticoagulation protocol: Yes   Plan:  Consult to restart heparin 2 hours after TR band removal. Spoke with RN in [place]. Heparin to RESTART at ~ 1845, will order heparin 1600 units/hr to start at 1900. Patient previously therapeutic on this rate. HL 4/28 at 0400. CBC daily.    0428 0440 HL 0.55, therapeutic.  CBC  stable.  Continue Heparin at current rate and recheck HL and CBC daily  Ena Dawley, PharmD Clinical Pharmacist 04/09/2020,6:28 AM

## 2020-04-09 NOTE — Discharge Summary (Addendum)
Daniel Luna [id number] DOB: Apr 17, 1941 DOA: 04/06/2020  PCP: Idelle Crouch, MD  Admit date: 04/06/2020 Discharge date: 04/10/2020 Admitted From: home Disposition:  Transfer to [facility]- accepting physician is Dr. Jerene Dilling      Discharge Condition:Stable CODE STATUS: Full Diet recommendation: Heart Healthy  Brief/Interim Summary: Daniel Luna  is a 79 y.o. male with a known history of coronary artery disease status post tree stands at [facility] in 2013, hypertension, hyperlipidemia, chronic systolic congestive heart failure with LV ejection fraction of about 35%, history of life vesicular heart block and removed tobacco abuse, type II diabetes comes to the emergency room with increasing shortness of breath, increased fatigue ability with regular walking and chest congestion with tightness on and off since last week.  He was seen by cardiology nurse practitioner on 21st April. Patient's Lasix was increased to 40 mg daily for five days. Patient's baseline weight is 229 pounds. In the office he was found to have weight of 238 pounds.   He was found with elevated BNP and troponin.  He was started on IV furosemide.  Cardiology was consulted.  Patient underwent cardiac catheterization and was found to a need for CABG.  Patient will be transferred to [facility] for CABG.   LHC 03/2020 Conclusions: 1. Severe multivessel coronary artery disease, including 95% distal LMCA stenosis, 70% mid LAD focal in-stent restenosis, 99% ostial LCx stenosis, and 70% mid RCA disease. 2. Moderately to severely elevated left heart filling pressures. 3. Moderately elevated right heart and pulmonary artery pressures. 4. Normal Fick cardiac output/index.   TTE 03/2019 1. Left ventricular ejection fraction, by estimation, is 15-20%. The left  ventricle has severely decreased function. The left ventricular internal  cavity size was moderately dilated. There is moderate left ventricular  hypertrophy. Left ventricular   diastolic parameters are consistent with Grade II diastolic dysfunction  (pseudonormalization).  2. Right ventricular systolic function is mildly reduced. The right  ventricular size is normal. There is mildly elevated pulmonary artery  systolic pressure.  3. Left atrial size was moderately dilated.  4. Right atrial size was moderately dilated.  5. Moderate pleural effusion in the left lateral region.  6. The mitral valve is normal in structure. Moderate mitral valve  regurgitation. No evidence of mitral stenosis.  7. The aortic valve is normal in structure. Aortic valve regurgitation is  not visualized. Mild to moderate aortic valve sclerosis/calcification is  present, without any evidence of aortic stenosis.   Discharge Diagnoses:  Active Problems:   Coronary artery disease involving native heart with angina pectoris [facility])   ACS (acute coronary syndrome) (HCC)   Stage 3b chronic kidney disease    Discharge Instructions  Discharge Instructions    Diet - low sodium heart healthy   Complete by: As directed    Increase activity slowly   Complete by: As directed      Allergies as of 04/09/2020      Reactions   Iodinated Diagnostic Agents Hives      Medication List    STOP taking these medications   carvedilol 12.5 MG tablet Commonly known as: COREG   furosemide 20 MG tablet Commonly known as: LASIX Replaced by: furosemide 10 MG/ML injection   lisinopril 40 MG tablet Commonly known as: ZESTRIL   meloxicam 15 MG tablet Commonly known as: MOBIC   metFORMIN 1000 MG tablet Commonly known as: GLUCOPHAGE   potassium chloride SA 20 MEQ tablet Commonly known as: KLOR-CON   simvastatin 80 MG tablet Commonly known  as: ZOCOR Replaced by: atorvastatin 80 MG tablet     TAKE these medications   acetaminophen 325 MG tablet Commonly known as: TYLENOL Take 2 tablets (650 mg total) by mouth every 4 (four) hours as needed for headache or mild pain.   aspirin 81 MG  EC tablet Take 1 tablet (81 mg total) by mouth daily. Start taking on: April 10, 2020   atorvastatin 80 MG tablet Commonly known as: LIPITOR Take 1 tablet (80 mg total) by mouth daily. Start taking on: April 10, 2020 Replaces: simvastatin 80 MG tablet   furosemide 10 MG/ML injection Commonly known as: LASIX Inject 2 mLs (20 mg total) into the vein 2 (two) times daily. Start taking on: April 10, 2020 Replaces: furosemide 20 MG tablet   heparin 25000-0.45 UT/250ML-% infusion Inject 1,600 Units/hr into the vein continuous.   insulin aspart 100 UNIT/ML injection Commonly known as: novoLOG Inject 0-5 Units into the skin at bedtime.   insulin aspart 100 UNIT/ML injection Commonly known as: novoLOG Inject 0-9 Units into the skin 3 (three) times daily with meals. Start taking on: April 10, 2020   losartan 100 MG tablet Commonly known as: COZAAR Take 1 tablet (100 mg total) by mouth daily. Start taking on: April 10, 2020   nitroGLYCERIN 0.4 MG SL tablet Commonly known as: NITROSTAT Place 1 tablet (0.4 mg total) under the tongue every 5 (five) minutes x 3 doses as needed for chest pain.   sertraline 50 MG tablet Commonly known as: ZOLOFT Take 50 mg by mouth daily.      Follow-up Information    [facility] Follow up on 04/21/2020.   Specialty: Cardiology Why: at 2:00pm. Enter through the [facility] entrance Contact information: [address] [place]         Allergies  Allergen Reactions  . Iodinated Diagnostic Agents Hives    Consultations:  Cardiology   Procedures/Studies: DG Chest 2 View  Result Date: 04/06/2020 CLINICAL DATA:  Shortness of breath, dyspnea on exertion for 10 days, previous tobacco abuse EXAM: CHEST - 2 VIEW COMPARISON:  Report only 02/14/2020 FINDINGS: Frontal and lateral views of the chest demonstrate an enlarged cardiac silhouette. There is  central vascular congestion with patchy airspace disease in the right right upper lobe and right lower lobe costophrenic angle. There is diffuse interstitial prominence. Small bilateral pleural effusions are noted. No pneumothorax. No acute bony abnormalities. IMPRESSION: 1. Constellation of findings most consistent with congestive heart failure. Electronically Signed   By: Randa Ngo M.D.   On: 04/06/2020 16:54   CARDIAC CATHETERIZATION  Result Date: 04/08/2020 Conclusions: 1. Severe multivessel coronary artery disease, including 95% distal LMCA stenosis, 70% mid LAD focal in-stent restenosis, 99% ostial LCx stenosis, and 70% mid RCA disease. 2. Moderately to severely elevated left heart filling pressures. 3. Moderately elevated right heart and pulmonary artery pressures. 4. Normal Fick cardiac output/index. Recommendations: 1. Transfer to tertiary care center for cardiac surgery consultation for CABG. 2. Escalate diuresis and optimize evidence-based heart failure therapy as renal function and blood pressure allow. 3. Restart heparin infusion 2 hours after deflation of TR band. Nelva Bush, MD [facility]   ECHOCARDIOGRAM COMPLETE  Result Date: 04/07/2020    ECHOCARDIOGRAM REPORT   Patient Name:   Daniel ALBANESE Trickel Date of Exam: 04/07/2020 Medical Rec #:  [id number]    Height:       75.0 in Accession #:  [id number]   Weight:       230.0 lb Date of Birth:  05-26-1941     BSA:          2.329 m Patient Age:    17 years     BP:           117/80 mmHg Patient Gender: M            HR:           73 bpm. Exam Location:  [facility] Procedure: 2D Echo, Cardiac Doppler and Color Doppler Indications:     Acute myocardial infarction 410  History:         Patient has no prior history of Echocardiogram examinations.                  CHF, Signs/Symptoms:Shortness of Breath and Dyspnea; Risk                  Factors:Hypertension and Diabetes. Ischemic cardiomyopathy.  Sonographer:     Sherrie Sport RDCS (AE) Referring Phys:   [place] Diagnosing Phys: Kathlyn Sacramento MD IMPRESSIONS  1. Left ventricular ejection fraction, by estimation, is 15-20%. The left ventricle has severely decreased function. The left ventricular internal cavity size was moderately dilated. There is moderate left ventricular hypertrophy. Left ventricular diastolic parameters are consistent with Grade II diastolic dysfunction (pseudonormalization).  2. Right ventricular systolic function is mildly reduced. The right ventricular size is normal. There is mildly elevated pulmonary artery systolic pressure.  3. Left atrial size was moderately dilated.  4. Right atrial size was moderately dilated.  5. Moderate pleural effusion in the left lateral region.  6. The mitral valve is normal in structure. Moderate mitral valve regurgitation. No evidence of mitral stenosis.  7. The aortic valve is normal in structure. Aortic valve regurgitation is not visualized. Mild to moderate aortic valve sclerosis/calcification is present, without any evidence of aortic stenosis. FINDINGS  Left Ventricle: Left ventricular ejection fraction, by estimation, is <20%. The left ventricle has severely decreased function. The left ventricle demonstrates regional wall motion abnormalities. The left ventricular internal cavity size was moderately dilated. There is moderate left ventricular hypertrophy. Left ventricular diastolic parameters are consistent with Grade II diastolic dysfunction (pseudonormalization). Right Ventricle: The right ventricular size is normal. No increase in right ventricular wall thickness. Right ventricular systolic function is mildly reduced. There is mildly elevated pulmonary artery systolic pressure. The tricuspid regurgitant velocity  is 2.59 m/s, and with an assumed right atrial pressure of 10 mmHg, the estimated right ventricular systolic pressure is [id number] mmHg. Left Atrium: Left atrial size was moderately dilated. Right Atrium: Right atrial size was  moderately dilated. Pericardium: There is no evidence of pericardial effusion. Mitral Valve: The mitral valve is normal in structure. There is mild thickening of the mitral valve leaflet(s). Normal mobility of the mitral valve leaflets. Moderate mitral valve regurgitation. No evidence of mitral valve stenosis. Tricuspid Valve: The tricuspid valve is normal in structure. Tricuspid valve regurgitation is trivial. No evidence of tricuspid stenosis. Aortic Valve: The aortic valve is normal in structure. Aortic valve regurgitation is not visualized. Mild to moderate aortic valve sclerosis/calcification is present, without any evidence of aortic stenosis. Aortic valve mean gradient measures 4.0 mmHg. Aortic valve peak gradient measures 6.5 mmHg. Aortic valve area, by VTI measures 1.54 cm. Pulmonic Valve: The pulmonic valve was normal in structure. Pulmonic valve regurgitation is not visualized. No evidence of pulmonic stenosis. Aorta: The aortic root is normal  in size and structure. Venous: The inferior vena cava was not well visualized. IAS/Shunts: No atrial level shunt detected by color flow Doppler. Additional Comments: There is a moderate pleural effusion in the left lateral region.  LEFT VENTRICLE PLAX 2D LVIDd:         6.54 cm      Diastology LVIDs:         5.90 cm      LV e' lateral:   8.70 cm/s LV PW:         1.03 cm      LV E/e' lateral: 11.7 LV IVS:        1.23 cm      LV e' medial:    4.57 cm/s LVOT diam:     2.20 cm      LV E/e' medial:  22.3 LV SV:         32 LV SV Index:   14 LVOT Area:     3.80 cm  LV Volumes (MOD) LV vol d, MOD A2C: 234.0 ml LV vol d, MOD A4C: 268.0 ml LV vol s, MOD A2C: 185.0 ml LV vol s, MOD A4C: 204.0 ml LV SV MOD A2C:     49.0 ml LV SV MOD A4C:     268.0 ml LV SV MOD BP:      60.7 ml RIGHT VENTRICLE RV Basal diam:  5.25 cm RV S prime:     11.50 cm/s TAPSE (M-mode): 4.3 cm LEFT ATRIUM              Index       RIGHT ATRIUM           Index LA diam:        4.50 cm  1.93 cm/m  RA Area:      26.60 cm LA Vol (A2C):   101.0 ml 43.37 ml/m RA Volume:   101.00 ml 43.37 ml/m LA Vol (A4C):   106.0 ml 45.52 ml/m LA Biplane Vol: 103.0 ml 44.23 ml/m  AORTIC VALVE                   PULMONIC VALVE AV Area (Vmax):    1.34 cm    PV Vmax:        0.67 m/s AV Area (Vmean):   1.40 cm    PV Peak grad:   1.8 mmHg AV Area (VTI):     1.54 cm    RVOT Peak grad: 1 mmHg AV Vmax:           127.33 cm/s AV Vmean:          91.533 cm/s AV VTI:            0.211 m AV Peak Grad:      6.5 mmHg AV Mean Grad:      4.0 mmHg LVOT Vmax:         45.00 cm/s LVOT Vmean:        33.600 cm/s LVOT VTI:          0.085 m LVOT/AV VTI ratio: 0.40  AORTA Ao Root diam: 3.20 cm MITRAL VALVE                TRICUSPID VALVE MV Area (PHT): 7.59 cm     TR Peak grad:   26.8 mmHg MV Decel Time: 100 msec     TR Vmax:        259.00 cm/s MV E velocity: 102.00 cm/s MV A velocity: 88.30 cm/s  SHUNTS MV E/A ratio:  1.16         Systemic VTI:  0.09 m                             Systemic Diam: 2.20 cm Kathlyn Sacramento MD Electronically signed by Kathlyn Sacramento MD Signature Date/Time: 04/07/2020/3:03:40 PM    Final       Subjective: No cp today. No new complaints Discharge Exam: Vitals:   04/09/20 1141 04/09/20 1521  BP: 92/60 111/76  Pulse: 67 70  Resp: 17 17  Temp: (!) 97.5 F (36.4 C) 97.6 F (36.4 C)  SpO2: 96% 98%   Vitals:   04/09/20 0730 04/09/20 1122 04/09/20 1141 04/09/20 1521  BP: 119/78 99/76 92/60  111/76  Pulse: 79 63 67 70  Resp: 16  17 17   Temp: 97.6 F (36.4 C)  (!) 97.5 F (36.4 C) 97.6 F (36.4 C)  TempSrc: Oral  Oral Oral  SpO2: 98%  96% 98%  Weight:      Height:        General: Pt is alert, awake, not in acute distress Cardiovascular: RRR, S1/S2 +, no rubs, no gallops Respiratory: CTA bilaterally, no wheezing, no rhonchi Abdominal: Soft, NT, ND, bowel sounds + Extremities: no edema, no cyanosis    The results of significant diagnostics from this hospitalization (including imaging, microbiology,  ancillary and laboratory) are listed below for reference.     Microbiology: Recent Results (from the past 240 hour(s))  Respiratory Panel by RT PCR (Flu A&B, Covid) - Nasopharyngeal Swab     Status: None   Collection Time: 04/06/20  5:21 PM   Specimen: Nasopharyngeal Swab  Result Value Ref Range Status   SARS Coronavirus 2 by RT PCR NEGATIVE NEGATIVE Final    Comment: (NOTE) SARS-CoV-2 target nucleic acids are NOT DETECTED. The SARS-CoV-2 RNA is generally detectable in upper respiratoy specimens during the acute phase of infection. The lowest concentration of SARS-CoV-2 viral copies this assay can detect is 131 copies/mL. A negative result does not preclude SARS-Cov-2 infection and should not be used as the sole basis for treatment or other patient management decisions. A negative result may occur with  improper specimen collection/handling, submission of specimen other than nasopharyngeal swab, presence of viral mutation(s) within the areas targeted by this assay, and inadequate number of viral copies (<131 copies/mL). A negative result must be combined with clinical observations, patient history, and epidemiological information. The expected result is Negative. Fact Sheet for Patients:  PinkCheek.be Fact Sheet for Healthcare Providers:  GravelBags.it This test is not yet ap proved or cleared by the [place] FDA and  has been authorized for detection and/or diagnosis of SARS-CoV-2 by FDA under an Emergency Use Authorization (EUA). This EUA will remain  in effect (meaning this test can be used) for the duration of the COVID-19 declaration under Section 564(b)(1) of the Act, 21 U.S.C. section 360bbb-3(b)(1), unless the authorization is terminated or revoked sooner.    Influenza A by PCR NEGATIVE NEGATIVE Final   Influenza B by PCR NEGATIVE NEGATIVE Final    Comment: (NOTE) The Xpert Xpress SARS-CoV-2/FLU/RSV assay is  intended as an aid in  the diagnosis of influenza from Nasopharyngeal swab specimens and  should not be used as a sole basis for treatment. Nasal washings and  aspirates are unacceptable for Xpert Xpress SARS-CoV-2/FLU/RSV  testing. Fact Sheet for Patients: PinkCheek.be Fact Sheet for Healthcare Providers: GravelBags.it This test is not  yet approved or cleared by the [place] and  has been authorized for detection and/or diagnosis of SARS-CoV-2 by  FDA under an Emergency Use Authorization (EUA). This EUA will remain  in effect (meaning this test can be used) for the duration of the  Covid-19 declaration under Section 564(b)(1) of the Act, 21  U.S.C. section 360bbb-3(b)(1), unless the authorization is  terminated or revoked. Performed at [facility], [address] [place], [place] [address]      Labs: BNP (last 3 results) Recent Labs    04/06/20 1716  BNP [id number]*   Basic Metabolic Panel: Recent Labs  Lab 04/06/20 1617 04/07/20 0239 04/08/20 0634 04/09/20 0440  NA 138 140 140 139  K 5.3* 4.2 3.9 4.2  CL 105 106 104 101  CO2 24 26 26 29   GLUCOSE 180* 145* 171* 210*  BUN 44* 39* 39* 47*  CREATININE 1.71* 1.60* 1.56* 1.77*  CALCIUM 9.3 9.3 9.4 9.5   Liver Function Tests: No results for input(s): AST, ALT, ALKPHOS, BILITOT, PROT, ALBUMIN in the last 168 hours. No results for input(s): LIPASE, AMYLASE in the last 168 hours. No results for input(s): AMMONIA in the last 168 hours. CBC: Recent Labs  Lab 04/06/20 1617 04/08/20 0634 04/09/20 0440  WBC 6.4 6.2 10.3  HGB 11.0* 11.4* 11.7*  HCT 33.5* 34.9* 35.1*  MCV 99.4 99.1 99.2  PLT 193 205 241   Cardiac Enzymes: No results for input(s): CKTOTAL, CKMB, CKMBINDEX, TROPONINI in the last 168 hours. BNP: Invalid input(s): POCBNP CBG: Recent Labs  Lab 04/08/20 1519 04/08/20 2108 04/09/20 0731 04/09/20 1142 04/09/20 1701  GLUCAP 179*  403* 193* 183* 187*   D-Dimer No results for input(s): DDIMER in the last 72 hours. Hgb A1c No results for input(s): HGBA1C in the last 72 hours. Lipid Profile Recent Labs    04/06/20 2018  CHOL 127  HDL 29*  LDLCALC 81  TRIG 83  CHOLHDL 4.4   Thyroid function studies No results for input(s): TSH, T4TOTAL, T3FREE, THYROIDAB in the last 72 hours.  Invalid input(s): FREET3 Anemia work up No results for input(s): VITAMINB12, FOLATE, FERRITIN, TIBC, IRON, RETICCTPCT in the last 72 hours. Urinalysis    Component Value Date/Time   COLORURINE STRAW (A) 08/26/2017 1047   APPEARANCEUR CLEAR (A) 08/26/2017 1047   LABSPEC 1.004 (L) 08/26/2017 1047   PHURINE 5.0 08/26/2017 1047   GLUCOSEU NEGATIVE 08/26/2017 1047   HGBUR NEGATIVE 08/26/2017 1047   BILIRUBINUR NEGATIVE 08/26/2017 1047   KETONESUR NEGATIVE 08/26/2017 1047   PROTEINUR NEGATIVE 08/26/2017 1047   NITRITE NEGATIVE 08/26/2017 1047   LEUKOCYTESUR NEGATIVE 08/26/2017 1047   Sepsis Labs Invalid input(s): PROCALCITONIN,  WBC,  LACTICIDVEN Microbiology Recent Results (from the past 240 hour(s))  Respiratory Panel by RT PCR (Flu A&B, Covid) - Nasopharyngeal Swab     Status: None   Collection Time: 04/06/20  5:21 PM   Specimen: Nasopharyngeal Swab  Result Value Ref Range Status   SARS Coronavirus 2 by RT PCR NEGATIVE NEGATIVE Final    Comment: (NOTE) SARS-CoV-2 target nucleic acids are NOT DETECTED. The SARS-CoV-2 RNA is generally detectable in upper respiratoy specimens during the acute phase of infection. The lowest concentration of SARS-CoV-2 viral copies this assay can detect is 131 copies/mL. A negative result does not preclude SARS-Cov-2 infection and should not be used as the sole basis for treatment or other patient management decisions. A negative result may occur with  improper specimen collection/handling, submission of specimen other than  nasopharyngeal swab, presence of viral mutation(s) within the areas  targeted by this assay, and inadequate number of viral copies (<131 copies/mL). A negative result must be combined with clinical observations, patient history, and epidemiological information. The expected result is Negative. Fact Sheet for Patients:  PinkCheek.be Fact Sheet for Healthcare Providers:  GravelBags.it This test is not yet ap proved or cleared by the [place] FDA and  has been authorized for detection and/or diagnosis of SARS-CoV-2 by FDA under an Emergency Use Authorization (EUA). This EUA will remain  in effect (meaning this test can be used) for the duration of the COVID-19 declaration under Section 564(b)(1) of the Act, 21 U.S.C. section 360bbb-3(b)(1), unless the authorization is terminated or revoked sooner.    Influenza A by PCR NEGATIVE NEGATIVE Final   Influenza B by PCR NEGATIVE NEGATIVE Final    Comment: (NOTE) The Xpert Xpress SARS-CoV-2/FLU/RSV assay is intended as an aid in  the diagnosis of influenza from Nasopharyngeal swab specimens and  should not be used as a sole basis for treatment. Nasal washings and  aspirates are unacceptable for Xpert Xpress SARS-CoV-2/FLU/RSV  testing. Fact Sheet for Patients: PinkCheek.be Fact Sheet for Healthcare Providers: GravelBags.it This test is not yet approved or cleared by the [place] FDA and  has been authorized for detection and/or diagnosis of SARS-CoV-2 by  FDA under an Emergency Use Authorization (EUA). This EUA will remain  in effect (meaning this test can be used) for the duration of the  Covid-19 declaration under Section 564(b)(1) of the Act, 21  U.S.C. section 360bbb-3(b)(1), unless the authorization is  terminated or revoked. Performed at [facility], [address], [place], [place] [address]      Time coordinating discharge: Over 30 minutes  SIGNED:   Nolberto Hanlon, MD  [facility] 04/09/2020, 5:53 PM Pager   If 7PM-7AM, please contact night-coverage [email or web address] Password [email or web address]

## 2020-04-09 NOTE — Progress Notes (Signed)
All medications administered by Lana Fish Student Nurse were supervised by this Rn. Lile Mccurley A Arris Meyn

## 2020-04-10 ENCOUNTER — Ambulatory Visit (HOSPITAL_COMMUNITY)
Admission: AD | Admit: 2020-04-10 | Discharge: 2020-04-10 | Disposition: A | Discharge status: Home / Self Care | Payer: Medicare Other | Source: Other Acute Inpatient Hospital | Attending: Internal Medicine | Admitting: Internal Medicine

## 2020-04-10 ENCOUNTER — Ambulatory Visit
Admit: 2020-04-10 | Discharge: 2020-04-16 | Disposition: A | Discharge status: Home / Self Care | Payer: MEDICARE | Source: Other Acute Inpatient Hospital | Admitting: Cardiovascular Disease

## 2020-04-10 DIAGNOSIS — I249 Acute ischemic heart disease, unspecified: Secondary | ICD-10-CM

## 2020-04-10 DIAGNOSIS — I25119 Atherosclerotic heart disease of native coronary artery with unspecified angina pectoris: Secondary | ICD-10-CM

## 2020-04-10 DIAGNOSIS — I509 Heart failure, unspecified: Secondary | ICD-10-CM | POA: Insufficient documentation

## 2020-04-10 DIAGNOSIS — I214 Non-ST elevation (NSTEMI) myocardial infarction: Secondary | ICD-10-CM | POA: Diagnosis not present

## 2020-04-10 DIAGNOSIS — I5023 Acute on chronic systolic (congestive) heart failure: Secondary | ICD-10-CM | POA: Diagnosis not present

## 2020-04-10 DIAGNOSIS — N1832 Chronic kidney disease, stage 3b: Secondary | ICD-10-CM | POA: Diagnosis not present

## 2020-04-10 LAB — CBC
HCT: 34.1 % — ABNORMAL LOW (ref 39.0–52.0)
Hemoglobin: 11.3 g/dL — ABNORMAL LOW (ref 13.0–17.0)
MCH: 32.8 pg (ref 26.0–34.0)
MCHC: 33.1 g/dL (ref 30.0–36.0)
MCV: 99.1 fL (ref 80.0–100.0)
Platelets: 202 10*3/uL (ref 150–400)
RBC: 3.44 MIL/uL — ABNORMAL LOW (ref 4.22–5.81)
RDW: 13.2 % (ref 11.5–15.5)
WBC: 8.4 10*3/uL (ref 4.0–10.5)
nRBC: 0 % (ref 0.0–0.2)

## 2020-04-10 LAB — HEPARIN LEVEL (UNFRACTIONATED)
Heparin Unfractionated: 0.84 IU/mL — ABNORMAL HIGH (ref 0.30–0.70)
Heparin Unfractionated: 0.48 IU/mL (ref 0.30–0.70)

## 2020-04-10 LAB — GLUCOSE, CAPILLARY
Glucose-Capillary: 171 mg/dL — ABNORMAL HIGH (ref 70–99)
Glucose-Capillary: 146 mg/dL — ABNORMAL HIGH (ref 70–99)

## 2020-04-10 MED ORDER — FUROSEMIDE 10 MG/ML IJ SOLN: 20.0000 mg | Freq: Every day | INTRAMUSCULAR | Status: DC

## 2020-04-10 NOTE — Consult Note (Signed)
[facility] for Heparin Infusion Indication: chest pain/ACS  Allergies  Allergen Reactions  . Iodinated Diagnostic Agents Hives    Patient Measurements: Height: 6\' 3"  (190.5 cm) Weight: 100.4 kg (221 lb 6.4 oz) IBW/kg (Calculated) : 84.5 Heparin Dosing Weight: 100.1 kg  Vital Signs: Temp: 97.4 F (36.3 C) (04/29 0310) Temp Source: Oral (04/29 0310) BP: 116/84 (04/29 0310) Pulse Rate: 73 (04/29 0310)  Labs: Recent Labs    04/08/20 0634 04/08/20 0634 04/09/20 0440 04/10/20 0553  HGB 11.4*   < > 11.7* 11.3*  HCT 34.9*  --  35.1* 34.1*  PLT 205  --  241 202  HEPARINUNFRC 0.58  --  0.55 0.84*  CREATININE 1.56*  --  1.77*  --    < > = values in this interval not displayed.    Estimated Creatinine Clearance: 42.4 mL/min (A) (by C-G formula based on SCr of 1.77 mg/dL (H)).   Medical History: Past Medical History:  Diagnosis Date  . Arthritis   . CHF (congestive heart failure) ([employer])   . Coronary artery disease    CAD s/p PCI to LAD and RCA 01/2012   . Cyst of brain 1983  . Diabetes mellitus without complication ([place])   . Dysrhythmia   . Heart failure ([place])   . Hyperlipidemia   . Hypertension   . Ischemic cardiomyopathy    LVEF [id number] % (systolic and diastolic, NYHA I-II symptoms)   . Macular degeneration   . Shortness of breath dyspnea      Assessment: Pharmacy has been consulted to initiate Heparin Infusion in 79yo male with known history of CAD and CHF presenting to the ED with increased shortness of breath with wheezing. No prior history of PTA anticoagulant use. Baseline labs: aPTT 36sec, INR 1.1, Hgb 11.0, PLTs 193.    Goal of Therapy:  Heparin level 0.3-0.7 units/ml Monitor platelets by anticoagulation protocol: Yes   Plan:  Consult to restart heparin 2 hours after TR band removal. Spoke with RN in [place]. Heparin to RESTART at ~ 1845, will order heparin 1600 units/hr to start at 1900. Patient previously  therapeutic on this rate. HL 4/28 at 0400. CBC daily.    0428 0440 HL 0.55, therapeutic.  CBC stable.  Continue Heparin at current rate and recheck HL and CBC daily  0429 0553 HL 084, SUPRAtherapeutic.  CBC stable.  Will decrease heparin drip to 1450 units/hr and recheck HL in 8 hours  Ena Dawley, PharmD Clinical Pharmacist 04/10/2020,6:34 AM

## 2020-04-10 NOTE — Consult Note (Signed)
[facility] for Heparin Infusion Indication: chest pain/ACS  Allergies  Allergen Reactions  . Iodinated Diagnostic Agents Hives    Patient Measurements: Height: 6\' 3"  (190.5 cm) Weight: 100.4 kg (221 lb 6.4 oz) IBW/kg (Calculated) : 84.5 Heparin Dosing Weight: 100.1 kg  Vital Signs: Temp: 97.7 F (36.5 C) (04/29 1142) Temp Source: Oral (04/29 1142) BP: 101/64 (04/29 1142) Pulse Rate: 65 (04/29 1142)  Labs: Recent Labs    04/08/20 0634 04/08/20 0634 04/09/20 0440 04/10/20 0553 04/10/20 1451  HGB 11.4*   < > 11.7* 11.3*  --   HCT 34.9*  --  35.1* 34.1*  --   PLT 205  --  241 202  --   HEPARINUNFRC 0.58   < > 0.55 0.84* 0.48  CREATININE 1.56*  --  1.77*  --   --    < > = values in this interval not displayed.    Estimated Creatinine Clearance: 42.4 mL/min (A) (by C-G formula based on SCr of 1.77 mg/dL (H)).   Medical History: Past Medical History:  Diagnosis Date  . Arthritis   . CHF (congestive heart failure) ([place])   . Coronary artery disease    CAD s/p PCI to LAD and RCA 01/2012   . Cyst of brain 1983  . Diabetes mellitus without complication ([place])   . Dysrhythmia   . Heart failure ([place])   . Hyperlipidemia   . Hypertension   . Ischemic cardiomyopathy    LVEF [id number] % (systolic and diastolic, NYHA I-II symptoms)   . Macular degeneration   . Shortness of breath dyspnea      Assessment: Pharmacy has been consulted to initiate Heparin Infusion in 79yo male with known history of CAD and CHF presenting to the ED with increased shortness of breath with wheezing. No prior history of PTA anticoagulant use. Baseline labs: aPTT 36sec, INR 1.1, Hgb 11.0, PLTs 193.    Goal of Therapy:  Heparin level 0.3-0.7 units/ml Monitor platelets by anticoagulation protocol: Yes   Plan:  Heparin level is therapeutic.  CBC stable.  Will continue heparin drip at 1450 units/hr and recheck HL in 8 hours. CBC daily  Oswald Hillock, PharmD,  BCPS Clinical Pharmacist 04/10/2020,3:21 PM

## 2020-04-10 NOTE — Progress Notes (Signed)
Patient now being transported to [facility] via [place]. Report given to [facility]link and [facility] at [facility]. VSS,. No complaints of SOB or CP upon transfer. IV's still intact, tele monitor taken off.

## 2020-04-10 NOTE — Progress Notes (Signed)
Progress Note  Patient Name: Daniel Luna Date of Encounter: 04/10/2020  Primary Cardiologist: [facility]- Ida Rogue, MD   Subjective   Feels well, no significant chest pain Reports he has been offered a bed at [facility] Discussed cardiac catheterization results with him in detail Echocardiogram discussed, ejection fraction 15 to 20% Reports previously unable to afford [facility]  Inpatient Medications    Scheduled Meds: . aspirin EC  81 mg Oral Daily  . atorvastatin  80 mg Oral Daily  . carvedilol  12.5 mg Oral BID  . furosemide  20 mg Intravenous BID  . insulin aspart  0-5 Units Subcutaneous QHS  . insulin aspart  0-9 Units Subcutaneous TID WC  . losartan  100 mg Oral Daily  . potassium chloride SA  20 mEq Oral Daily  . sertraline  50 mg Oral Daily  . sodium chloride flush  3 mL Intravenous Q12H   Continuous Infusions: . sodium chloride    . heparin 1,450 Units/hr (04/10/20 1134)   PRN Meds: sodium chloride, acetaminophen, nitroGLYCERIN, ondansetron (ZOFRAN) IV, sodium chloride flush   Vital Signs    Vitals:   04/10/20 0730 04/10/20 1142 04/10/20 1549 04/10/20 1555  BP: 119/77 101/64 109/66 109/69  Pulse: 69 65 68 68  Resp: 17 17 17 17   Temp: 97.7 F (36.5 C) 97.7 F (36.5 C) 97.9 F (36.6 C) 97.9 F (36.6 C)  TempSrc: Oral Oral Oral   SpO2: 98% 98% 99% 99%  Weight:      Height:        Intake/Output Summary (Last 24 hours) at 04/10/2020 1603 Last data filed at 04/10/2020 1406 Gross per 24 hour  Intake 600 ml  Output 900 ml  Net -300 ml   Last 3 Weights 04/10/2020 04/09/2020 04/08/2020  Weight (lbs) 221 lb 6.4 oz 219 lb 8 oz 220 lb 7.4 oz  Weight (kg) 100.426 kg 99.565 kg 100 kg      Telemetry    Normal sinus rhythm- Personally Reviewed  ECG     - Personally Reviewed  Physical Exam   GEN: No acute distress.   Neck: No JVD Cardiac: RRR, no murmurs, rubs, or gallops.  Respiratory: Clear to auscultation bilaterally. GI: Soft, nontender, non-distended   MS: No edema; No deformity. Neuro:  Nonfocal  Psych: Normal affect   Labs    High Sensitivity Troponin:   Recent Labs  Lab 04/06/20 1617 04/06/20 1721 04/06/20 2018 04/06/20 2237  TROPONINIHS 702* 714* 630* 503*      Chemistry Recent Labs  Lab 04/07/20 0239 04/08/20 0634 04/09/20 0440  NA 140 140 139  K 4.2 3.9 4.2  CL 106 104 101  CO2 26 26 29   GLUCOSE 145* 171* 210*  BUN 39* 39* 47*  CREATININE 1.60* 1.56* 1.77*  CALCIUM 9.3 9.4 9.5  GFRNONAA 41* 43* 36*  GFRAA 48* 49* 42*  ANIONGAP 8 10 9      Hematology Recent Labs  Lab 04/08/20 0634 04/09/20 0440 04/10/20 0553  WBC 6.2 10.3 8.4  RBC 3.52* 3.54* 3.44*  HGB 11.4* 11.7* 11.3*  HCT 34.9* 35.1* 34.1*  MCV 99.1 99.2 99.1  MCH 32.4 33.1 32.8  MCHC 32.7 33.3 33.1  RDW 13.1 12.9 13.2  PLT 205 241 202    BNP Recent Labs  Lab 04/06/20 1716  BNP 2,725.0*     DDimer No results for input(s): DDIMER in the last 168 hours.   Radiology    No results found.  Cardiac Studies   Echo  1. Left ventricular ejection fraction, by estimation, is 15-20%. The left  ventricle has severely decreased function. The left ventricular internal  cavity size was moderately dilated. There is moderate left ventricular  hypertrophy. Left ventricular  diastolic parameters are consistent with Grade II diastolic dysfunction  (pseudonormalization).  2. Right ventricular systolic function is mildly reduced. The right  ventricular size is normal. There is mildly elevated pulmonary artery  systolic pressure.  3. Left atrial size was moderately dilated.  4. Right atrial size was moderately dilated.  5. Moderate pleural effusion in the left lateral region.  6. The mitral valve is normal in structure. Moderate mitral valve  regurgitation. No evidence of mitral stenosis.  7. The aortic valve is normal in structure. Aortic valve regurgitation is  not visualized. Mild to moderate aortic valve sclerosis/calcification is    present, without any evidence of aortic stenosis.  Patient Profile     79 y.o. male with history of CAD s/p two-vessel PCI in 0000000, chronic systolic heart failure, hypertension, hyperlipidemia, and type 2 diabetes mellitus, admitted with acute on chronic systolic heart failure and NSTEMI.   Assessment & Plan     NSTEMI/unstable angina Peak troponin 714,  Cardiac catheterization with critical distal left main disease, severe mid LAD in-stent restenosis, critical ostial left circumflex disease, severe mid RCA disease Patient has indicated his wishes to go to [facility] for surgery --Transfer to [facility] initiated, patient reports he has been assigned a bed -Currently on aspirin, Coreg, Lipitor, heparin  Acute on chronic systolic CHF On Coreg, losartan, Lasix IV with potassium Climbing BUN and creatinine on today's labs, will change to Lasix 20 IV daily , down from twice daily Close monitoring of renal function -ACE inhibitor, ARB, entresto on hold in setting of renal dysfunction     Long discussion concerning catheterization findings, CABG, recovery, cardiac rehab  Total encounter time more than 35 minutes  Greater than 50% was spent in counseling and coordination of care with the patient   For questions or updates, please contact [facility] Please consult [email or web address] for contact info under        Signed, Ida Rogue, MD  04/10/2020, 4:03 PM

## 2020-04-11 DIAGNOSIS — D539 Nutritional anemia, unspecified: Secondary | ICD-10-CM | POA: Insufficient documentation

## 2020-04-11 LAB — GLUCOSE, CAPILLARY: Glucose-Capillary: 128 mg/dL — ABNORMAL HIGH (ref 70–99)

## 2020-04-11 MED ORDER — ATORVASTATIN CALCIUM 80 MG PO TABS
80.00 | ORAL_TABLET | ORAL | Status: DC
Start: 2020-04-17 — End: 2020-04-11

## 2020-04-11 MED ORDER — HEPARIN SODIUM (PORCINE) 1000 UNIT/ML IJ SOLN
2000.00 | INTRAMUSCULAR | Status: DC
Start: ? — End: 2020-04-11

## 2020-04-11 MED ORDER — CYANOCOBALAMIN 1000 MCG PO TABS
1000.00 | ORAL_TABLET | ORAL | Status: DC
Start: 2020-04-17 — End: 2020-04-11

## 2020-04-11 MED ORDER — SENNOSIDES 8.6 MG PO TABS
1.00 | ORAL_TABLET | ORAL | Status: DC
Start: 2020-04-15 — End: 2020-04-11

## 2020-04-11 MED ORDER — POLYETHYLENE GLYCOL 3350 17 GM/SCOOP PO POWD
17.00 | ORAL | Status: DC
Start: 2020-04-17 — End: 2020-04-11

## 2020-04-11 MED ORDER — CALCIUM CARBONATE 1250 (500 CA) MG PO CHEW
CHEWABLE_TABLET | ORAL | Status: DC
Start: ? — End: 2020-04-11

## 2020-04-11 MED ORDER — ASPIRIN 81 MG PO CHEW
81.00 | CHEWABLE_TABLET | ORAL | Status: DC
Start: 2020-04-17 — End: 2020-04-11

## 2020-04-11 MED ORDER — INSULIN LISPRO 100 UNIT/ML ~~LOC~~ SOLN
0.00 | SUBCUTANEOUS | Status: DC
Start: 2020-04-11 — End: 2020-04-11

## 2020-04-11 MED ORDER — DEXTROSE 50 % IV SOLN
12.50 | INTRAVENOUS | Status: DC
Start: ? — End: 2020-04-11

## 2020-04-11 MED ORDER — HEPARIN SOD (PORCINE) IN D5W 100 UNIT/ML IV SOLN
12.00 | INTRAVENOUS | Status: DC
Start: ? — End: 2020-04-11

## 2020-04-11 MED ORDER — CARVEDILOL 12.5 MG PO TABS
12.50 | ORAL_TABLET | ORAL | Status: DC
Start: 2020-04-16 — End: 2020-04-11

## 2020-04-11 MED ORDER — SERTRALINE HCL 50 MG PO TABS
50.00 | ORAL_TABLET | ORAL | Status: DC
Start: 2020-04-17 — End: 2020-04-11

## 2020-04-11 MED ORDER — ENTRESTO 24 MG-26 MG TABLET
ORAL_TABLET | Freq: Two times a day (BID) | ORAL | 0 refills | 30.00000 days
Start: 2020-04-11 — End: 2020-04-11

## 2020-04-11 MED ORDER — TICAGRELOR 90 MG TABLET
ORAL_TABLET | Freq: Two times a day (BID) | ORAL | 0 refills | 30.00000 days
Start: 2020-04-11 — End: 2020-04-11

## 2020-04-11 MED ORDER — EMPAGLIFLOZIN 10 MG TABLET
ORAL_TABLET | Freq: Every day | ORAL | 0 refills | 30.00000 days
Start: 2020-04-11 — End: 2020-04-11

## 2020-04-12 MED ORDER — EMPAGLIFLOZIN 10 MG PO TABS
10.00 | ORAL_TABLET | ORAL | Status: DC
Start: 2020-04-17 — End: 2020-04-12

## 2020-04-12 MED ORDER — FUROSEMIDE 20 MG PO TABS
20.00 | ORAL_TABLET | ORAL | Status: DC
Start: 2020-04-17 — End: 2020-04-12

## 2020-04-12 MED ORDER — SPIRONOLACTONE 25 MG PO TABS
12.50 | ORAL_TABLET | ORAL | Status: DC
Start: 2020-04-13 — End: 2020-04-12

## 2020-04-14 DIAGNOSIS — Z01818 Encounter for other preprocedural examination: Principal | ICD-10-CM

## 2020-04-14 MED ORDER — DEXTROSE 50 % IV SOLN
12.50 | INTRAVENOUS | Status: DC
Start: ? — End: 2020-04-14

## 2020-04-14 MED ORDER — INSULIN LISPRO 100 UNIT/ML ~~LOC~~ SOLN
0.00 | SUBCUTANEOUS | Status: DC
Start: 2020-04-16 — End: 2020-04-14

## 2020-04-15 MED ORDER — SENNOSIDES 8.6 MG PO TABS
2.00 | ORAL_TABLET | ORAL | Status: DC
Start: 2020-04-17 — End: 2020-04-15

## 2020-04-16 ENCOUNTER — Ambulatory Visit: Payer: Medicare Other | Admitting: Family

## 2020-04-17 MED ORDER — ATORVASTATIN 80 MG TABLET
ORAL_TABLET | Freq: Every day | ORAL | 11 refills | 30 days | Status: CP
Start: 2020-04-17 — End: ?

## 2020-04-17 MED ORDER — EMPAGLIFLOZIN 10 MG TABLET
ORAL_TABLET | Freq: Every day | ORAL | 11 refills | 30 days | Status: CP
Start: 2020-04-17 — End: ?

## 2020-04-17 MED ORDER — CLOPIDOGREL 75 MG TABLET
ORAL_TABLET | Freq: Every day | ORAL | 11 refills | 30.00000 days | Status: CP
Start: 2020-04-17 — End: ?

## 2020-04-18 ENCOUNTER — Non-Acute Institutional Stay: Admit: 2020-04-18 | Discharge: 2020-04-19 | Payer: MEDICARE

## 2020-04-18 DIAGNOSIS — I214 Non-ST elevation (NSTEMI) myocardial infarction: Principal | ICD-10-CM

## 2020-04-18 DIAGNOSIS — I255 Ischemic cardiomyopathy: Principal | ICD-10-CM

## 2020-04-18 MED ORDER — CLOPIDOGREL BISULFATE 75 MG PO TABS
75.00 | ORAL_TABLET | ORAL | Status: DC
Start: 2020-04-17 — End: 2020-04-18

## 2020-04-21 ENCOUNTER — Ambulatory Visit: Payer: Medicare Other | Admitting: Family

## 2020-04-23 ENCOUNTER — Encounter
Admit: 2020-04-23 | Discharge: 2020-04-24 | Payer: MEDICARE | Attending: Nurse Practitioner | Primary: Nurse Practitioner

## 2020-04-23 DIAGNOSIS — I5022 Chronic systolic (congestive) heart failure: Principal | ICD-10-CM

## 2020-04-23 MED ORDER — CLOPIDOGREL 75 MG TABLET
ORAL_TABLET | Freq: Every day | ORAL | 3 refills | 90.00000 days | Status: CP
Start: 2020-04-23 — End: ?

## 2020-04-23 MED ORDER — SACUBITRIL 24 MG-VALSARTAN 26 MG TABLET
ORAL_TABLET | Freq: Two times a day (BID) | ORAL | 11 refills | 30.00000 days | Status: CP
Start: 2020-04-23 — End: 2021-04-23

## 2020-04-23 MED ORDER — ATORVASTATIN 80 MG TABLET
ORAL_TABLET | Freq: Every day | ORAL | 3 refills | 90 days | Status: CP
Start: 2020-04-23 — End: ?

## 2020-04-25 ENCOUNTER — Other Ambulatory Visit: Payer: Self-pay

## 2020-04-25 ENCOUNTER — Encounter: Payer: Medicare Other | Attending: Internal Medicine | Admitting: *Deleted

## 2020-04-25 ENCOUNTER — Emergency Department: Admit: 2020-04-25 | Discharge: 2020-04-25 | Disposition: A | Discharge status: Home / Self Care | Payer: MEDICARE

## 2020-04-25 ENCOUNTER — Encounter: Admit: 2020-04-25 | Discharge: 2020-04-25 | Disposition: A | Discharge status: Home / Self Care | Payer: MEDICARE

## 2020-04-25 DIAGNOSIS — Z955 Presence of coronary angioplasty implant and graft: Secondary | ICD-10-CM

## 2020-04-25 DIAGNOSIS — I5022 Chronic systolic (congestive) heart failure: Secondary | ICD-10-CM | POA: Insufficient documentation

## 2020-04-25 DIAGNOSIS — I214 Non-ST elevation (NSTEMI) myocardial infarction: Secondary | ICD-10-CM | POA: Insufficient documentation

## 2020-04-25 DIAGNOSIS — R55 Syncope and collapse: Principal | ICD-10-CM

## 2020-04-25 DIAGNOSIS — I959 Hypotension, unspecified: Principal | ICD-10-CM

## 2020-04-25 NOTE — Progress Notes (Signed)
Initial telephone encounter complete. Diagnosis can be found in [facility] 4/29. EP orientation scheduled for Monday 5/17 at 8:30

## 2020-04-28 ENCOUNTER — Other Ambulatory Visit: Payer: Self-pay

## 2020-04-28 ENCOUNTER — Encounter: Payer: Medicare Other | Admitting: *Deleted

## 2020-04-28 VITALS — Ht 74.5 in | Wt 213.5 lb

## 2020-04-28 DIAGNOSIS — Z955 Presence of coronary angioplasty implant and graft: Secondary | ICD-10-CM | POA: Diagnosis not present

## 2020-04-28 DIAGNOSIS — I5022 Chronic systolic (congestive) heart failure: Secondary | ICD-10-CM | POA: Diagnosis not present

## 2020-04-28 DIAGNOSIS — I214 Non-ST elevation (NSTEMI) myocardial infarction: Secondary | ICD-10-CM | POA: Diagnosis present

## 2020-04-28 MED ORDER — SACUBITRIL 24 MG-VALSARTAN 26 MG TABLET
ORAL_TABLET | Freq: Two times a day (BID) | ORAL | 11 refills | 30 days
Start: 2020-04-28 — End: 2021-04-28

## 2020-04-28 NOTE — Patient Instructions (Signed)
Patient Instructions  Patient Details  Name: Daniel Luna MRN: [id number] Date of Birth: [birth year] Referring Provider:  Adella Nissen*  Below are your personal goals for exercise, nutrition, and risk factors. Our goal is to help you stay on track towards obtaining and maintaining these goals. We will be discussing your progress on these goals with you throughout the program.  Initial Exercise Prescription: Initial Exercise Prescription - 04/28/20 0900      Date of Initial Exercise RX and Referring Provider   Date  04/28/20    Referring Provider  Posey Boyer MD      Treadmill   MPH  2.5    Grade  0.5    Minutes  15    METs  3.09      Recumbant Bike   Level  3    RPM  50    Watts  32    Minutes  15    METs  3      NuStep   Level  3    SPM  80    Minutes  15    METs  3      Recumbant Elliptical   Level  1    RPM  50    Minutes  15    METs  3      Prescription Details   Frequency (times per week)  3    Duration  Progress to 30 minutes of continuous aerobic without signs/symptoms of physical distress      Intensity   THRR 40-80% of Max Heartrate  104-131    Ratings of Perceived Exertion  11-13    Perceived Dyspnea  0-4      Progression   Progression  Continue to progress workloads to maintain intensity without signs/symptoms of physical distress.      Resistance Training   Training Prescription  Yes    Weight  3 lb    Reps  10-15       Exercise Goals: Frequency: Be able to perform aerobic exercise two to three times per week in program working toward 2-5 days per week of home exercise.  Intensity: Work with a perceived exertion of 11 (fairly light) - 15 (hard) while following your exercise prescription.  We will make changes to your prescription with you as you progress through the program.   Duration: Be able to do 30 to 45 minutes of continuous aerobic exercise in addition to a 5 minute warm-up and a 5 minute cool-down routine.    Nutrition Goals: Your personal nutrition goals will be established when you do your nutrition analysis with the dietician.  The following are general nutrition guidelines to follow: Cholesterol < 200mg /day Sodium < 1500mg /day Fiber: Men over 50 yrs - 30 grams per day  Personal Goals: Personal Goals and Risk Factors at Admission - 04/28/20 1003      Core Components/Risk Factors/Patient Goals on Admission    Weight Management  Yes;Weight Loss    Intervention  Weight Management: Develop a combined nutrition and exercise program designed to reach desired caloric intake, while maintaining appropriate intake of nutrient and fiber, sodium and fats, and appropriate energy expenditure required for the weight goal.;Weight Management: Provide education and appropriate resources to help participant work on and attain dietary goals.    Admit Weight  213 lb 8 oz (96.8 kg)    Goal Weight: Short Term  208 lb (94.3 kg)    Goal Weight: Long Term  203 lb (92.1 kg)  Expected Outcomes  Short Term: Continue to assess and modify interventions until short term weight is achieved;Long Term: Adherence to nutrition and physical activity/exercise program aimed toward attainment of established weight goal;Weight Loss: Understanding of general recommendations for a balanced deficit meal plan, which promotes 1-2 lb weight loss per week and includes a negative energy balance of [phone] kcal/d;Understanding recommendations for meals to include 15-35% energy as protein, 25-35% energy from fat, 35-60% energy from carbohydrates, less than 200mg  of dietary cholesterol, 20-35 gm of total fiber daily;Understanding of distribution of calorie intake throughout the day with the consumption of 4-5 meals/snacks    Diabetes  Yes    Intervention  Provide education about signs/symptoms and action to take for hypo/hyperglycemia.;Provide education about proper nutrition, including hydration, and aerobic/resistive exercise prescription along  with prescribed medications to achieve blood glucose in normal ranges: Fasting glucose 65-99 mg/dL    Expected Outcomes  Short Term: Participant verbalizes understanding of the signs/symptoms and immediate care of hyper/hypoglycemia, proper foot care and importance of medication, aerobic/resistive exercise and nutrition plan for blood glucose control.;Long Term: Attainment of HbA1C < 7%.    Hypertension  Yes    Intervention  Provide education on lifestyle modifcations including regular physical activity/exercise, weight management, moderate sodium restriction and increased consumption of fresh fruit, vegetables, and low fat dairy, alcohol moderation, and smoking cessation.;Monitor prescription use compliance.    Expected Outcomes  Short Term: Continued assessment and intervention until BP is < 140/60mm HG in hypertensive participants. < 130/48mm HG in hypertensive participants with diabetes, heart failure or chronic kidney disease.;Long Term: Maintenance of blood pressure at goal levels.    Lipids  Yes    Intervention  Provide education and support for participant on nutrition & aerobic/resistive exercise along with prescribed medications to achieve LDL 70mg , HDL >40mg .    Expected Outcomes  Short Term: Participant states understanding of desired cholesterol values and is compliant with medications prescribed. Participant is following exercise prescription and nutrition guidelines.;Long Term: Cholesterol controlled with medications as prescribed, with individualized exercise RX and with personalized nutrition plan. Value goals: LDL < 70mg , HDL > 40 mg.       Tobacco Use Initial Evaluation: Social History   Tobacco Use  Smoking Status Former Smoker  . Packs/day: 2.00  . Years: 27.00  . Pack years: 54.00  . Types: Cigarettes  . Quit date: 12/21/1988  . Years since quitting: 31.3  Smokeless Tobacco Never Used    Exercise Goals and Review: Exercise Goals    Row Name 04/28/20 0948              Exercise Goals   Increase Physical Activity  Yes       Intervention  Provide advice, education, support and counseling about physical activity/exercise needs.;Develop an individualized exercise prescription for aerobic and resistive training based on initial evaluation findings, risk stratification, comorbidities and participant's personal goals.       Expected Outcomes  Short Term: Attend rehab on a regular basis to increase amount of physical activity.;Long Term: Add in home exercise to make exercise part of routine and to increase amount of physical activity.;Long Term: Exercising regularly at least 3-5 days a week.       Increase Strength and Stamina  Yes       Intervention  Provide advice, education, support and counseling about physical activity/exercise needs.;Develop an individualized exercise prescription for aerobic and resistive training based on initial evaluation findings, risk stratification, comorbidities and participant's personal goals.  Expected Outcomes  Short Term: Increase workloads from initial exercise prescription for resistance, speed, and METs.;Long Term: Improve cardiorespiratory fitness, muscular endurance and strength as measured by increased METs and functional capacity (6MWT);Short Term: Perform resistance training exercises routinely during rehab and add in resistance training at home       Able to understand and use rate of perceived exertion (RPE) scale  Yes       Intervention  Provide education and explanation on how to use RPE scale       Expected Outcomes  Short Term: Able to use RPE daily in rehab to express subjective intensity level;Long Term:  Able to use RPE to guide intensity level when exercising independently       Able to understand and use Dyspnea scale  Yes       Intervention  Provide education and explanation on how to use Dyspnea scale       Expected Outcomes  Short Term: Able to use Dyspnea scale daily in rehab to express subjective sense of  shortness of breath during exertion;Long Term: Able to use Dyspnea scale to guide intensity level when exercising independently       Knowledge and understanding of Target Heart Rate Range (THRR)  Yes       Intervention  Provide education and explanation of THRR including how the numbers were predicted and where they are located for reference       Expected Outcomes  Short Term: Able to state/look up THRR;Short Term: Able to use daily as guideline for intensity in rehab;Long Term: Able to use THRR to govern intensity when exercising independently       Able to check pulse independently  Yes       Intervention  Provide education and demonstration on how to check pulse in carotid and radial arteries.;Review the importance of being able to check your own pulse for safety during independent exercise       Expected Outcomes  Short Term: Able to explain why pulse checking is important during independent exercise;Long Term: Able to check pulse independently and accurately       Understanding of Exercise Prescription  Yes       Intervention  Provide education, explanation, and written materials on patient's individual exercise prescription       Expected Outcomes  Short Term: Able to explain program exercise prescription;Long Term: Able to explain home exercise prescription to exercise independently          Copy of goals given to participant.

## 2020-04-28 NOTE — Progress Notes (Signed)
Cardiac Individual Treatment Plan  Patient Details  Name: Daniel Luna MRN: [id number] Date of Birth: [birth year] Referring Provider:     Cardiac Rehab from 04/28/2020 in [facility] Cardiac and Pulmonary Rehab  Referring Provider  Posey Boyer MD      Initial Encounter Date:    Cardiac Rehab from 04/28/2020 in [facility] Cardiac and Pulmonary Rehab  Date  04/28/20      Visit Diagnosis: NSTEMI (non-ST elevated myocardial infarction) [facility])  Status post coronary artery stent placement  Patient's Home Medications on Admission:  Current Outpatient Medications:  .  ACCU-CHEK GUIDE test strip, , Disp: , Rfl:  .  acetaminophen (TYLENOL) 325 MG tablet, Take 2 tablets (650 mg total) by mouth every 4 (four) hours as needed for headache or mild pain., Disp:  , Rfl:  .  aspirin EC 81 MG EC tablet, Take 1 tablet (81 mg total) by mouth daily., Disp:  , Rfl:  .  atorvastatin (LIPITOR) 80 MG tablet, Take 1 tablet (80 mg total) by mouth daily., Disp:  , Rfl:  .  Calcium Polycarbophil (FIBER) 625 MG TABS, Take by mouth., Disp: , Rfl:  .  clopidogrel (PLAVIX) 75 MG tablet, Take 75 mg by mouth daily., Disp: , Rfl:  .  Cysteamine Bitartrate (PROCYSBI) 300 MG PACK, Use one strip to test blood sugars twice daily, Disp: , Rfl:  .  ENTRESTO 24-26 MG, Take 1 tablet by mouth 2 (two) times daily., Disp: , Rfl:  .  furosemide (LASIX) 10 MG/ML injection, Inject 2 mLs (20 mg total) into the vein 2 (two) times daily. (Patient not taking: Reported on 04/25/2020), Disp: 4 mL, Rfl: 0 .  heparin 25000-0.45 UT/250ML-% infusion, Inject 1,600 Units/hr into the vein continuous. (Patient not taking: Reported on 04/25/2020), Disp:  , Rfl:  .  insulin aspart (NOVOLOG) 100 UNIT/ML injection, Inject 0-5 Units into the skin at bedtime. (Patient not taking: Reported on 04/25/2020), Disp: 10 mL, Rfl: 11 .  insulin aspart (NOVOLOG) 100 UNIT/ML injection, Inject 0-9 Units into the skin 3 (three) times daily with meals. (Patient not taking:  Reported on 04/25/2020), Disp: 10 mL, Rfl: 11 .  JARDIANCE 10 MG TABS tablet, Take 10 mg by mouth daily., Disp: , Rfl:  .  losartan (COZAAR) 100 MG tablet, Take 1 tablet (100 mg total) by mouth daily. (Patient not taking: Reported on 04/25/2020), Disp:  , Rfl:  .  metFORMIN (GLUCOPHAGE) 1000 MG tablet, Take 1,000 mg by mouth 2 (two) times daily., Disp: , Rfl:  .  nitroGLYCERIN (NITROSTAT) 0.4 MG SL tablet, Place 1 tablet (0.4 mg total) under the tongue every 5 (five) minutes x 3 doses as needed for chest pain. (Patient not taking: Reported on 04/25/2020), Disp:  , Rfl: 12 .  sertraline (ZOLOFT) 50 MG tablet, Take 50 mg by mouth daily., Disp: , Rfl:   Past Medical History: Past Medical History:  Diagnosis Date  . Arthritis   . CHF (congestive heart failure) ([place])   . Coronary artery disease    CAD s/p PCI to LAD and RCA 01/2012   . Cyst of brain 1983  . Diabetes mellitus without complication ([place])   . Dysrhythmia   . Heart failure ([place])   . Hyperlipidemia   . Hypertension   . Ischemic cardiomyopathy    LVEF 62-83 % (systolic and diastolic, NYHA I-II symptoms)   . Macular degeneration   . Shortness of breath dyspnea     Tobacco Use: Social History   Tobacco Use  Smoking Status Former Smoker  . Packs/day: 2.00  . Years: 27.00  . Pack years: 54.00  . Types: Cigarettes  . Quit date: 12/21/1988  . Years since quitting: 31.3  Smokeless Tobacco Never Used    Labs: Recent Review Scientist, physiological    Labs for ITP Cardiac and Pulmonary Rehab Latest Ref Rng & Units 05/07/2015 04/06/2020   Cholestrol 0 - 200 mg/dL - 127   LDLCALC 0 - 99 mg/dL - 81   HDL >40 mg/dL - 29(L)   Trlycerides <150 mg/dL - 83   Hemoglobin A1c 4.8 - 5.6 % 5.8 6.6(H)       Exercise Target Goals: Exercise Program Goal: Individual exercise prescription set using results from initial 6 min walk test and THRR while considering  patient's activity barriers and safety.   Exercise Prescription Goal: Initial exercise  prescription builds to 30-45 minutes a day of aerobic activity, 2-3 days per week.  Home exercise guidelines will be given to patient during program as part of exercise prescription that the participant will acknowledge.   Education: Aerobic Exercise & Resistance Training: - Gives group verbal and written instruction on the various components of exercise. Focuses on aerobic and resistive training programs and the benefits of this training and how to safely progress through these programs..   Education: Exercise & Equipment Safety: - Individual verbal instruction and demonstration of equipment use and safety with use of the equipment.   Cardiac Rehab from 04/28/2020 in [facility] Cardiac and Pulmonary Rehab  Date  04/28/20  Educator  [facility]  Instruction Review Code  1- Verbalizes Understanding      Education: Exercise Physiology & General Exercise Guidelines: - Group verbal and written instruction with models to review the exercise physiology of the cardiovascular system and associated critical values. Provides general exercise guidelines with specific guidelines to those with heart or lung disease.    Education: Flexibility, Balance, Mind/Body Relaxation: Provides group verbal/written instruction on the benefits of flexibility and balance training, including mind/body exercise modes such as yoga, pilates and tai chi.  Demonstration and skill practice provided.   Activity Barriers & Risk Stratification: Activity Barriers & Cardiac Risk Stratification - 04/28/20 0944      Activity Barriers & Cardiac Risk Stratification   Activity Barriers  Left Knee Replacement;Right Knee Replacement;Balance Concerns;Muscular Weakness;Deconditioning    Cardiac Risk Stratification  High       6 Minute Walk: 6 Minute Walk    Row Name 04/28/20 0943         6 Minute Walk   Phase  Initial     Distance  1445 feet     Walk Time  6 minutes     # of Rest Breaks  0     MPH  2.74     METS  3.04     RPE  10      VO2 Peak  10.64     Symptoms  Yes (comment)     Comments  heavy gait on left side     Resting HR  78 bpm     Resting BP  128/74     Resting Oxygen Saturation   99 %     Exercise Oxygen Saturation  during 6 min walk  95 %     Max Ex. HR  95 bpm     Max Ex. BP  148/72     2 Minute Post BP  134/62        Oxygen Initial Assessment:  Oxygen Re-Evaluation:   Oxygen Discharge (Final Oxygen Re-Evaluation):   Initial Exercise Prescription: Initial Exercise Prescription - 04/28/20 0900      Date of Initial Exercise RX and Referring Provider   Date  04/28/20    Referring Provider  Posey Boyer MD      Treadmill   MPH  2.5    Grade  0.5    Minutes  15    METs  3.09      Recumbant Bike   Level  3    RPM  50    Watts  32    Minutes  15    METs  3      NuStep   Level  3    SPM  80    Minutes  15    METs  3      Recumbant Elliptical   Level  1    RPM  50    Minutes  15    METs  3      Prescription Details   Frequency (times per week)  3    Duration  Progress to 30 minutes of continuous aerobic without signs/symptoms of physical distress      Intensity   THRR 40-80% of Max Heartrate  104-131    Ratings of Perceived Exertion  11-13    Perceived Dyspnea  0-4      Progression   Progression  Continue to progress workloads to maintain intensity without signs/symptoms of physical distress.      Resistance Training   Training Prescription  Yes    Weight  3 lb    Reps  10-15       Perform Capillary Blood Glucose checks as needed.  Exercise Prescription Changes: Exercise Prescription Changes    Row Name 04/28/20 0900             Response to Exercise   Blood Pressure (Admit)  128/74       Blood Pressure (Exercise)  148/72       Blood Pressure (Exit)  134/62       Heart Rate (Admit)  78 bpm       Heart Rate (Exercise)  95 bpm       Heart Rate (Exit)  82 bpm       Oxygen Saturation (Admit)  99 %       Oxygen Saturation (Exercise)  95 %       Rating  of Perceived Exertion (Exercise)  10       Symptoms  heavy on left foot       Comments  walk test results          Exercise Comments:   Exercise Goals and Review: Exercise Goals    Row Name 04/28/20 0948             Exercise Goals   Increase Physical Activity  Yes       Intervention  Provide advice, education, support and counseling about physical activity/exercise needs.;Develop an individualized exercise prescription for aerobic and resistive training based on initial evaluation findings, risk stratification, comorbidities and participant's personal goals.       Expected Outcomes  Short Term: Attend rehab on a regular basis to increase amount of physical activity.;Long Term: Add in home exercise to make exercise part of routine and to increase amount of physical activity.;Long Term: Exercising regularly at least 3-5 days a week.       Increase Strength and Stamina  Yes  Intervention  Provide advice, education, support and counseling about physical activity/exercise needs.;Develop an individualized exercise prescription for aerobic and resistive training based on initial evaluation findings, risk stratification, comorbidities and participant's personal goals.       Expected Outcomes  Short Term: Increase workloads from initial exercise prescription for resistance, speed, and METs.;Long Term: Improve cardiorespiratory fitness, muscular endurance and strength as measured by increased METs and functional capacity (6MWT);Short Term: Perform resistance training exercises routinely during rehab and add in resistance training at home       Able to understand and use rate of perceived exertion (RPE) scale  Yes       Intervention  Provide education and explanation on how to use RPE scale       Expected Outcomes  Short Term: Able to use RPE daily in rehab to express subjective intensity level;Long Term:  Able to use RPE to guide intensity level when exercising independently       Able to  understand and use Dyspnea scale  Yes       Intervention  Provide education and explanation on how to use Dyspnea scale       Expected Outcomes  Short Term: Able to use Dyspnea scale daily in rehab to express subjective sense of shortness of breath during exertion;Long Term: Able to use Dyspnea scale to guide intensity level when exercising independently       Knowledge and understanding of Target Heart Rate Range (THRR)  Yes       Intervention  Provide education and explanation of THRR including how the numbers were predicted and where they are located for reference       Expected Outcomes  Short Term: Able to state/look up THRR;Short Term: Able to use daily as guideline for intensity in rehab;Long Term: Able to use THRR to govern intensity when exercising independently       Able to check pulse independently  Yes       Intervention  Provide education and demonstration on how to check pulse in carotid and radial arteries.;Review the importance of being able to check your own pulse for safety during independent exercise       Expected Outcomes  Short Term: Able to explain why pulse checking is important during independent exercise;Long Term: Able to check pulse independently and accurately       Understanding of Exercise Prescription  Yes       Intervention  Provide education, explanation, and written materials on patient's individual exercise prescription       Expected Outcomes  Short Term: Able to explain program exercise prescription;Long Term: Able to explain home exercise prescription to exercise independently          Exercise Goals Re-Evaluation :   Discharge Exercise Prescription (Final Exercise Prescription Changes): Exercise Prescription Changes - 04/28/20 0900      Response to Exercise   Blood Pressure (Admit)  128/74    Blood Pressure (Exercise)  148/72    Blood Pressure (Exit)  134/62    Heart Rate (Admit)  78 bpm    Heart Rate (Exercise)  95 bpm    Heart Rate (Exit)  82 bpm     Oxygen Saturation (Admit)  99 %    Oxygen Saturation (Exercise)  95 %    Rating of Perceived Exertion (Exercise)  10    Symptoms  heavy on left foot    Comments  walk test results       Nutrition:  Target Goals: Understanding of  nutrition guidelines, daily intake of sodium '1500mg'$ , cholesterol '200mg'$ , calories 30% from fat and 7% or less from saturated fats, daily to have 5 or more servings of fruits and vegetables.  Education: Controlling Sodium/Reading Food Labels -Group verbal and written material supporting the discussion of sodium use in heart healthy nutrition. Review and explanation with models, verbal and written materials for utilization of the food label.   Education: General Nutrition Guidelines/Fats and Fiber: -Group instruction provided by verbal, written material, models and posters to present the general guidelines for heart healthy nutrition. Gives an explanation and review of dietary fats and fiber.   Biometrics: Pre Biometrics - 04/28/20 1000      Pre Biometrics   Height  6' 2.5" (1.892 m)    Weight  213 lb 8 oz (96.8 kg)    BMI (Calculated)  27.05    Single Leg Stand  1.81 seconds        Nutrition Therapy Plan and Nutrition Goals:   Nutrition Assessments: Nutrition Assessments - 04/28/20 1000      MEDFICTS Scores   Pre Score  24       MEDIFICTS Score Key:          ?70 Need to make dietary changes          40-70 Heart Healthy Diet         ? 40 Therapeutic Level Cholesterol Diet  Nutrition Goals Re-Evaluation:   Nutrition Goals Discharge (Final Nutrition Goals Re-Evaluation):   Psychosocial: Target Goals: Acknowledge presence or absence of significant depression and/or stress, maximize coping skills, provide positive support system. Participant is able to verbalize types and ability to use techniques and skills needed for reducing stress and depression.   Education: Depression - Provides group verbal and written instruction on the  correlation between heart/lung disease and depressed mood, treatment options, and the stigmas associated with seeking treatment.   Education: Sleep Hygiene -Provides group verbal and written instruction about how sleep can affect your health.  Define sleep hygiene, discuss sleep cycles and impact of sleep habits. Review good sleep hygiene tips.     Education: Stress and Anxiety: - Provides group verbal and written instruction about the health risks of elevated stress and causes of high stress.  Discuss the correlation between heart/lung disease and anxiety and treatment options. Review healthy ways to manage with stress and anxiety.    Initial Review & Psychosocial Screening: Initial Psych Review & Screening - 04/25/20 1307      Initial Review   Current issues with  Current Stress Concerns      Family Dynamics   Good Support System?  Yes      Barriers   Psychosocial barriers to participate in program  There are no identifiable barriers or psychosocial needs.;The patient should benefit from training in stress management and relaxation.      Screening Interventions   Interventions  Encouraged to exercise;To provide support and resources with identified psychosocial needs;Provide feedback about the scores to participant    Expected Outcomes  Short Term goal: Utilizing psychosocial counselor, staff and physician to assist with identification of specific Stressors or current issues interfering with healing process. Setting desired goal for each stressor or current issue identified.;Long Term Goal: Stressors or current issues are controlled or eliminated.;Short Term goal: Identification and review with participant of any Quality of Life or Depression concerns found by scoring the questionnaire.;Long Term goal: The participant improves quality of Life and PHQ9 Scores as seen by post scores and/or verbalization  of changes       Quality of Life Scores:  Quality of Life - 04/28/20 1000       Quality of Life   Select  Quality of Life      Quality of Life Scores   Health/Function Pre  15.96 %    Socioeconomic Pre  23.75 %    Psych/Spiritual Pre  26.14 %    Family Pre  28.8 %    GLOBAL Pre  22.9 %      Scores of 19 and below usually indicate a poorer quality of life in these areas.  A difference of  2-3 points is a clinically meaningful difference.  A difference of 2-3 points in the total score of the Quality of Life Index has been associated with significant improvement in overall quality of life, self-image, physical symptoms, and general health in studies assessing change in quality of life.  PHQ-9: Recent Review Flowsheet Data    Depression screen [facility] 2/9 04/28/2020   Decreased Interest 0   Down, Depressed, Hopeless 0   PHQ - 2 Score 0   Altered sleeping 1   Tired, decreased energy 1   Change in appetite 0   Feeling bad or failure about yourself  0   Trouble concentrating 0   Moving slowly or fidgety/restless 0   Suicidal thoughts 0   PHQ-9 Score 2   Difficult doing work/chores Not difficult at all     Interpretation of Total Score  Total Score Depression Severity:  1-4 = Minimal depression, 5-9 = Mild depression, 10-14 = Moderate depression, 15-19 = Moderately severe depression, 20-27 = Severe depression   Psychosocial Evaluation and Intervention: Psychosocial Evaluation - 04/25/20 1312      Psychosocial Evaluation & Interventions   Comments  Mr. Blackburn reports doing well. He stated he is on something to help with his stress given he's had a lot of health issues lately. He is looking forward to exercising. [facility] so he is ready to get back to feeling better.    Expected Outcomes  Short: attend [facility] for exercise and education. Long: maintain positive self care habits       Psychosocial Re-Evaluation:   Psychosocial Discharge (Final Psychosocial Re-Evaluation):   Vocational Rehabilitation: Provide vocational rehab  assistance to qualifying candidates.   Vocational Rehab Evaluation & Intervention: Vocational Rehab - 04/25/20 1307      Initial Vocational Rehab Evaluation & Intervention   Assessment shows need for Vocational Rehabilitation  No       Education: Education Goals: Education classes will be provided on a variety of topics geared toward better understanding of heart health and risk factor modification. Participant will state understanding/return demonstration of topics presented as noted by education test scores.  Learning Barriers/Preferences: Learning Barriers/Preferences - 04/25/20 1307      Learning Barriers/Preferences   Learning Barriers  None    Learning Preferences  None       General Cardiac Education Topics:  AED/CPR: - Group verbal and written instruction with the use of models to demonstrate the basic use of the AED with the basic ABC's of resuscitation.   Anatomy & Physiology of the Heart: - Group verbal and written instruction and models provide basic cardiac anatomy and physiology, with the coronary electrical and arterial systems. Review of Valvular disease and Heart Failure   Cardiac Procedures: - Group verbal and written instruction to review commonly prescribed medications for heart disease. Reviews the medication, class  of the drug, and side effects. Includes the steps to properly store meds and maintain the prescription regimen. (beta blockers and nitrates)   Cardiac Medications I: - Group verbal and written instruction to review commonly prescribed medications for heart disease. Reviews the medication, class of the drug, and side effects. Includes the steps to properly store meds and maintain the prescription regimen.   Cardiac Medications II: -Group verbal and written instruction to review commonly prescribed medications for heart disease. Reviews the medication, class of the drug, and side effects. (all other drug classes)    Go Sex-Intimacy & Heart  Disease, Get SMART - Goal Setting: - Group verbal and written instruction through game format to discuss heart disease and the return to sexual intimacy. Provides group verbal and written material to discuss and apply goal setting through the application of the S.M.A.R.T. Method.   Other Matters of the Heart: - Provides group verbal, written materials and models to describe Stable Angina and Peripheral Artery. Includes description of the disease process and treatment options available to the cardiac patient.   Infection Prevention: - Provides verbal and written material to individual with discussion of infection control including proper hand washing and proper equipment cleaning during exercise session.   Cardiac Rehab from 04/28/2020 in [facility] Cardiac and Pulmonary Rehab  Date  04/28/20  Educator  [facility]  Instruction Review Code  1- Verbalizes Understanding      Falls Prevention: - Provides verbal and written material to individual with discussion of falls prevention and safety.   Cardiac Rehab from 04/28/2020 in [facility] Cardiac and Pulmonary Rehab  Date  04/28/20  Educator  [facility]  Instruction Review Code  1- Verbalizes Understanding      Other: -Provides group and verbal instruction on various topics (see comments)   Knowledge Questionnaire Score: Knowledge Questionnaire Score - 04/28/20 1002      Knowledge Questionnaire Score   Pre Score  20/26 Education Focus: Pulse, depression, PAD, angina, exercise, nutrition       Core Components/Risk Factors/Patient Goals at Admission: Personal Goals and Risk Factors at Admission - 04/28/20 1003      Core Components/Risk Factors/Patient Goals on Admission    Weight Management  Yes;Weight Loss    Intervention  Weight Management: Develop a combined nutrition and exercise program designed to reach desired caloric intake, while maintaining appropriate intake of nutrient and fiber, sodium and fats, and appropriate energy expenditure required for the  weight goal.;Weight Management: Provide education and appropriate resources to help participant work on and attain dietary goals.    Admit Weight  213 lb 8 oz (96.8 kg)    Goal Weight: Short Term  208 lb (94.3 kg)    Goal Weight: Long Term  203 lb (92.1 kg)    Expected Outcomes  Short Term: Continue to assess and modify interventions until short term weight is achieved;Long Term: Adherence to nutrition and physical activity/exercise program aimed toward attainment of established weight goal;Weight Loss: Understanding of general recommendations for a balanced deficit meal plan, which promotes 1-2 lb weight loss per week and includes a negative energy balance of [phone] kcal/d;Understanding recommendations for meals to include 15-35% energy as protein, 25-35% energy from fat, 35-60% energy from carbohydrates, less than '200mg'$  of dietary cholesterol, 20-35 gm of total fiber daily;Understanding of distribution of calorie intake throughout the day with the consumption of 4-5 meals/snacks    Diabetes  Yes    Intervention  Provide education about signs/symptoms and action to take for hypo/hyperglycemia.;Provide education about  proper nutrition, including hydration, and aerobic/resistive exercise prescription along with prescribed medications to achieve blood glucose in normal ranges: Fasting glucose 65-99 mg/dL    Expected Outcomes  Short Term: Participant verbalizes understanding of the signs/symptoms and immediate care of hyper/hypoglycemia, proper foot care and importance of medication, aerobic/resistive exercise and nutrition plan for blood glucose control.;Long Term: Attainment of HbA1C < 7%.    Hypertension  Yes    Intervention  Provide education on lifestyle modifcations including regular physical activity/exercise, weight management, moderate sodium restriction and increased consumption of fresh fruit, vegetables, and low fat dairy, alcohol moderation, and smoking cessation.;Monitor prescription use  compliance.    Expected Outcomes  Short Term: Continued assessment and intervention until BP is < 140/56m HG in hypertensive participants. < 130/869mHG in hypertensive participants with diabetes, heart failure or chronic kidney disease.;Long Term: Maintenance of blood pressure at goal levels.    Lipids  Yes    Intervention  Provide education and support for participant on nutrition & aerobic/resistive exercise along with prescribed medications to achieve LDL '70mg'$ , HDL >'40mg'$ .    Expected Outcomes  Short Term: Participant states understanding of desired cholesterol values and is compliant with medications prescribed. Participant is following exercise prescription and nutrition guidelines.;Long Term: Cholesterol controlled with medications as prescribed, with individualized exercise RX and with personalized nutrition plan. Value goals: LDL < '70mg'$ , HDL > 40 mg.       Education:Diabetes - Individual verbal and written instruction to review signs/symptoms of diabetes, desired ranges of glucose level fasting, after meals and with exercise. Acknowledge that pre and post exercise glucose checks will be done for 3 sessions at entry of program.   Education: Know Your Numbers and Risk Factors: -Group verbal and written instruction about important numbers in your health.  Discussion of what are risk factors and how they play a role in the disease process.  Review of Cholesterol, Blood Pressure, Diabetes, and BMI and the role they play in your overall health.   Core Components/Risk Factors/Patient Goals Review:    Core Components/Risk Factors/Patient Goals at Discharge (Final Review):    ITP Comments: ITP Comments    Row Name 04/25/20 1315 04/28/20 0938         ITP Comments  Initial telephone encounter complete. [facility] EP orientation scheduled for Monday 5/17 at 8:30  Completed 6MWT and gym orientation.  Initial ITP created and sent for review to Dr. MaEmily FilbertMedical  Director.         Comments: Initial ITP

## 2020-04-30 ENCOUNTER — Encounter: Payer: Self-pay | Admitting: *Deleted

## 2020-04-30 ENCOUNTER — Other Ambulatory Visit: Payer: Self-pay

## 2020-04-30 ENCOUNTER — Encounter: Payer: Medicare Other | Admitting: *Deleted

## 2020-04-30 DIAGNOSIS — Z955 Presence of coronary angioplasty implant and graft: Secondary | ICD-10-CM

## 2020-04-30 DIAGNOSIS — I214 Non-ST elevation (NSTEMI) myocardial infarction: Secondary | ICD-10-CM

## 2020-04-30 LAB — GLUCOSE, CAPILLARY
Glucose-Capillary: 142 mg/dL — ABNORMAL HIGH (ref 70–99)
Glucose-Capillary: 77 mg/dL (ref 70–99)

## 2020-04-30 NOTE — Progress Notes (Signed)
Cardiac Individual Treatment Plan  Patient Details  Name: Daniel Luna MRN: [id number] Date of Birth: Jan 02, 1941 Referring Provider:     Cardiac Rehab from 04/28/2020 in [facility] Cardiac and Pulmonary Rehab  Referring Provider  Posey Boyer MD      Initial Encounter Date:    Cardiac Rehab from 04/28/2020 in [facility] Cardiac and Pulmonary Rehab  Date  04/28/20      Visit Diagnosis: NSTEMI (non-ST elevated myocardial infarction) [facility])  Status post coronary artery stent placement  Patient's Home Medications on Admission:  Current Outpatient Medications:  .  ACCU-CHEK GUIDE test strip, , Disp: , Rfl:  .  acetaminophen (TYLENOL) 325 MG tablet, Take 2 tablets (650 mg total) by mouth every 4 (four) hours as needed for headache or mild pain., Disp:  , Rfl:  .  aspirin EC 81 MG EC tablet, Take 1 tablet (81 mg total) by mouth daily., Disp:  , Rfl:  .  atorvastatin (LIPITOR) 80 MG tablet, Take 1 tablet (80 mg total) by mouth daily., Disp:  , Rfl:  .  Calcium Polycarbophil (FIBER) 625 MG TABS, Take by mouth., Disp: , Rfl:  .  clopidogrel (PLAVIX) 75 MG tablet, Take 75 mg by mouth daily., Disp: , Rfl:  .  Cysteamine Bitartrate (PROCYSBI) 300 MG PACK, Use one strip to test blood sugars twice daily, Disp: , Rfl:  .  ENTRESTO 24-26 MG, Take 1 tablet by mouth 2 (two) times daily., Disp: , Rfl:  .  furosemide (LASIX) 10 MG/ML injection, Inject 2 mLs (20 mg total) into the vein 2 (two) times daily. (Patient not taking: Reported on 04/25/2020), Disp: 4 mL, Rfl: 0 .  heparin 25000-0.45 UT/250ML-% infusion, Inject 1,600 Units/hr into the vein continuous. (Patient not taking: Reported on 04/25/2020), Disp:  , Rfl:  .  insulin aspart (NOVOLOG) 100 UNIT/ML injection, Inject 0-5 Units into the skin at bedtime. (Patient not taking: Reported on 04/25/2020), Disp: 10 mL, Rfl: 11 .  insulin aspart (NOVOLOG) 100 UNIT/ML injection, Inject 0-9 Units into the skin 3 (three) times daily with meals. (Patient not taking:  Reported on 04/25/2020), Disp: 10 mL, Rfl: 11 .  JARDIANCE 10 MG TABS tablet, Take 10 mg by mouth daily., Disp: , Rfl:  .  losartan (COZAAR) 100 MG tablet, Take 1 tablet (100 mg total) by mouth daily. (Patient not taking: Reported on 04/25/2020), Disp:  , Rfl:  .  metFORMIN (GLUCOPHAGE) 1000 MG tablet, Take 1,000 mg by mouth 2 (two) times daily., Disp: , Rfl:  .  nitroGLYCERIN (NITROSTAT) 0.4 MG SL tablet, Place 1 tablet (0.4 mg total) under the tongue every 5 (five) minutes x 3 doses as needed for chest pain. (Patient not taking: Reported on 04/25/2020), Disp:  , Rfl: 12 .  sertraline (ZOLOFT) 50 MG tablet, Take 50 mg by mouth daily., Disp: , Rfl:   Past Medical History: Past Medical History:  Diagnosis Date  . Arthritis   . CHF (congestive heart failure) ([place])   . Coronary artery disease    CAD s/p PCI to LAD and RCA 01/2012   . Cyst of brain 1983  . Diabetes mellitus without complication ([place])   . Dysrhythmia   . Heart failure ([place])   . Hyperlipidemia   . Hypertension   . Ischemic cardiomyopathy    LVEF 08-14 % (systolic and diastolic, NYHA I-II symptoms)   . Macular degeneration   . Shortness of breath dyspnea     Tobacco Use: Social History   Tobacco Use  Smoking Status Former Smoker  . Packs/day: 2.00  . Years: 27.00  . Pack years: 54.00  . Types: Cigarettes  . Quit date: 12/21/1988  . Years since quitting: 31.3  Smokeless Tobacco Never Used    Labs: Recent Review Scientist, physiological    Labs for ITP Cardiac and Pulmonary Rehab Latest Ref Rng & Units 05/07/2015 04/06/2020   Cholestrol 0 - 200 mg/dL - 127   LDLCALC 0 - 99 mg/dL - 81   HDL >40 mg/dL - 29(L)   Trlycerides <150 mg/dL - 83   Hemoglobin A1c 4.8 - 5.6 % 5.8 6.6(H)       Exercise Target Goals: Exercise Program Goal: Individual exercise prescription set using results from initial 6 min walk test and THRR while considering  patient's activity barriers and safety.   Exercise Prescription Goal: Initial exercise  prescription builds to 30-45 minutes a day of aerobic activity, 2-3 days per week.  Home exercise guidelines will be given to patient during program as part of exercise prescription that the participant will acknowledge.   Education: Aerobic Exercise & Resistance Training: - Gives group verbal and written instruction on the various components of exercise. Focuses on aerobic and resistive training programs and the benefits of this training and how to safely progress through these programs..   Education: Exercise & Equipment Safety: - Individual verbal instruction and demonstration of equipment use and safety with use of the equipment.   Cardiac Rehab from 04/28/2020 in [facility] Cardiac and Pulmonary Rehab  Date  04/28/20  Educator  [facility]  Instruction Review Code  1- Verbalizes Understanding      Education: Exercise Physiology & General Exercise Guidelines: - Group verbal and written instruction with models to review the exercise physiology of the cardiovascular system and associated critical values. Provides general exercise guidelines with specific guidelines to those with heart or lung disease.    Education: Flexibility, Balance, Mind/Body Relaxation: Provides group verbal/written instruction on the benefits of flexibility and balance training, including mind/body exercise modes such as yoga, pilates and tai chi.  Demonstration and skill practice provided.   Activity Barriers & Risk Stratification: Activity Barriers & Cardiac Risk Stratification - 04/28/20 0944      Activity Barriers & Cardiac Risk Stratification   Activity Barriers  Left Knee Replacement;Right Knee Replacement;Balance Concerns;Muscular Weakness;Deconditioning    Cardiac Risk Stratification  High       6 Minute Walk: 6 Minute Walk    Row Name 04/28/20 0943         6 Minute Walk   Phase  Initial     Distance  1445 feet     Walk Time  6 minutes     # of Rest Breaks  0     MPH  2.74     METS  3.04     RPE  10      VO2 Peak  10.64     Symptoms  Yes (comment)     Comments  heavy gait on left side     Resting HR  78 bpm     Resting BP  128/74     Resting Oxygen Saturation   99 %     Exercise Oxygen Saturation  during 6 min walk  95 %     Max Ex. HR  95 bpm     Max Ex. BP  148/72     2 Minute Post BP  134/62        Oxygen Initial Assessment:  Oxygen Re-Evaluation:   Oxygen Discharge (Final Oxygen Re-Evaluation):   Initial Exercise Prescription: Initial Exercise Prescription - 04/28/20 0900      Date of Initial Exercise RX and Referring Provider   Date  04/28/20    Referring Provider  Posey Boyer MD      Treadmill   MPH  2.5    Grade  0.5    Minutes  15    METs  3.09      Recumbant Bike   Level  3    RPM  50    Watts  32    Minutes  15    METs  3      NuStep   Level  3    SPM  80    Minutes  15    METs  3      Recumbant Elliptical   Level  1    RPM  50    Minutes  15    METs  3      Prescription Details   Frequency (times per week)  3    Duration  Progress to 30 minutes of continuous aerobic without signs/symptoms of physical distress      Intensity   THRR 40-80% of Max Heartrate  104-131    Ratings of Perceived Exertion  11-13    Perceived Dyspnea  0-4      Progression   Progression  Continue to progress workloads to maintain intensity without signs/symptoms of physical distress.      Resistance Training   Training Prescription  Yes    Weight  3 lb    Reps  10-15       Perform Capillary Blood Glucose checks as needed.  Exercise Prescription Changes: Exercise Prescription Changes    Row Name 04/28/20 0900             Response to Exercise   Blood Pressure (Admit)  128/74       Blood Pressure (Exercise)  148/72       Blood Pressure (Exit)  134/62       Heart Rate (Admit)  78 bpm       Heart Rate (Exercise)  95 bpm       Heart Rate (Exit)  82 bpm       Oxygen Saturation (Admit)  99 %       Oxygen Saturation (Exercise)  95 %       Rating  of Perceived Exertion (Exercise)  10       Symptoms  heavy on left foot       Comments  walk test results          Exercise Comments:   Exercise Goals and Review: Exercise Goals    Row Name 04/28/20 0948             Exercise Goals   Increase Physical Activity  Yes       Intervention  Provide advice, education, support and counseling about physical activity/exercise needs.;Develop an individualized exercise prescription for aerobic and resistive training based on initial evaluation findings, risk stratification, comorbidities and participant's personal goals.       Expected Outcomes  Short Term: Attend rehab on a regular basis to increase amount of physical activity.;Long Term: Add in home exercise to make exercise part of routine and to increase amount of physical activity.;Long Term: Exercising regularly at least 3-5 days a week.       Increase Strength and Stamina  Yes  Intervention  Provide advice, education, support and counseling about physical activity/exercise needs.;Develop an individualized exercise prescription for aerobic and resistive training based on initial evaluation findings, risk stratification, comorbidities and participant's personal goals.       Expected Outcomes  Short Term: Increase workloads from initial exercise prescription for resistance, speed, and METs.;Long Term: Improve cardiorespiratory fitness, muscular endurance and strength as measured by increased METs and functional capacity (6MWT);Short Term: Perform resistance training exercises routinely during rehab and add in resistance training at home       Able to understand and use rate of perceived exertion (RPE) scale  Yes       Intervention  Provide education and explanation on how to use RPE scale       Expected Outcomes  Short Term: Able to use RPE daily in rehab to express subjective intensity level;Long Term:  Able to use RPE to guide intensity level when exercising independently       Able to  understand and use Dyspnea scale  Yes       Intervention  Provide education and explanation on how to use Dyspnea scale       Expected Outcomes  Short Term: Able to use Dyspnea scale daily in rehab to express subjective sense of shortness of breath during exertion;Long Term: Able to use Dyspnea scale to guide intensity level when exercising independently       Knowledge and understanding of Target Heart Rate Range (THRR)  Yes       Intervention  Provide education and explanation of THRR including how the numbers were predicted and where they are located for reference       Expected Outcomes  Short Term: Able to state/look up THRR;Short Term: Able to use daily as guideline for intensity in rehab;Long Term: Able to use THRR to govern intensity when exercising independently       Able to check pulse independently  Yes       Intervention  Provide education and demonstration on how to check pulse in carotid and radial arteries.;Review the importance of being able to check your own pulse for safety during independent exercise       Expected Outcomes  Short Term: Able to explain why pulse checking is important during independent exercise;Long Term: Able to check pulse independently and accurately       Understanding of Exercise Prescription  Yes       Intervention  Provide education, explanation, and written materials on patient's individual exercise prescription       Expected Outcomes  Short Term: Able to explain program exercise prescription;Long Term: Able to explain home exercise prescription to exercise independently          Exercise Goals Re-Evaluation :   Discharge Exercise Prescription (Final Exercise Prescription Changes): Exercise Prescription Changes - 04/28/20 0900      Response to Exercise   Blood Pressure (Admit)  128/74    Blood Pressure (Exercise)  148/72    Blood Pressure (Exit)  134/62    Heart Rate (Admit)  78 bpm    Heart Rate (Exercise)  95 bpm    Heart Rate (Exit)  82 bpm     Oxygen Saturation (Admit)  99 %    Oxygen Saturation (Exercise)  95 %    Rating of Perceived Exertion (Exercise)  10    Symptoms  heavy on left foot    Comments  walk test results       Nutrition:  Target Goals: Understanding of  nutrition guidelines, daily intake of sodium '1500mg'$ , cholesterol '200mg'$ , calories 30% from fat and 7% or less from saturated fats, daily to have 5 or more servings of fruits and vegetables.  Education: Controlling Sodium/Reading Food Labels -Group verbal and written material supporting the discussion of sodium use in heart healthy nutrition. Review and explanation with models, verbal and written materials for utilization of the food label.   Education: General Nutrition Guidelines/Fats and Fiber: -Group instruction provided by verbal, written material, models and posters to present the general guidelines for heart healthy nutrition. Gives an explanation and review of dietary fats and fiber.   Biometrics: Pre Biometrics - 04/28/20 1000      Pre Biometrics   Height  6' 2.5" (1.892 m)    Weight  213 lb 8 oz (96.8 kg)    BMI (Calculated)  27.05    Single Leg Stand  1.81 seconds        Nutrition Therapy Plan and Nutrition Goals:   Nutrition Assessments: Nutrition Assessments - 04/28/20 1000      MEDFICTS Scores   Pre Score  24       MEDIFICTS Score Key:          ?70 Need to make dietary changes          40-70 Heart Healthy Diet         ? 40 Therapeutic Level Cholesterol Diet  Nutrition Goals Re-Evaluation:   Nutrition Goals Discharge (Final Nutrition Goals Re-Evaluation):   Psychosocial: Target Goals: Acknowledge presence or absence of significant depression and/or stress, maximize coping skills, provide positive support system. Participant is able to verbalize types and ability to use techniques and skills needed for reducing stress and depression.   Education: Depression - Provides group verbal and written instruction on the  correlation between heart/lung disease and depressed mood, treatment options, and the stigmas associated with seeking treatment.   Education: Sleep Hygiene -Provides group verbal and written instruction about how sleep can affect your health.  Define sleep hygiene, discuss sleep cycles and impact of sleep habits. Review good sleep hygiene tips.     Education: Stress and Anxiety: - Provides group verbal and written instruction about the health risks of elevated stress and causes of high stress.  Discuss the correlation between heart/lung disease and anxiety and treatment options. Review healthy ways to manage with stress and anxiety.    Initial Review & Psychosocial Screening: Initial Psych Review & Screening - 04/25/20 1307      Initial Review   Current issues with  Current Stress Concerns      Family Dynamics   Good Support System?  Yes      Barriers   Psychosocial barriers to participate in program  There are no identifiable barriers or psychosocial needs.;The patient should benefit from training in stress management and relaxation.      Screening Interventions   Interventions  Encouraged to exercise;To provide support and resources with identified psychosocial needs;Provide feedback about the scores to participant    Expected Outcomes  Short Term goal: Utilizing psychosocial counselor, staff and physician to assist with identification of specific Stressors or current issues interfering with healing process. Setting desired goal for each stressor or current issue identified.;Long Term Goal: Stressors or current issues are controlled or eliminated.;Short Term goal: Identification and review with participant of any Quality of Life or Depression concerns found by scoring the questionnaire.;Long Term goal: The participant improves quality of Life and PHQ9 Scores as seen by post scores and/or verbalization  of changes       Quality of Life Scores:  Quality of Life - 04/28/20 1000       Quality of Life   Select  Quality of Life      Quality of Life Scores   Health/Function Pre  15.96 %    Socioeconomic Pre  23.75 %    Psych/Spiritual Pre  26.14 %    Family Pre  28.8 %    GLOBAL Pre  22.9 %      Scores of 19 and below usually indicate a poorer quality of life in these areas.  A difference of  2-3 points is a clinically meaningful difference.  A difference of 2-3 points in the total score of the Quality of Life Index has been associated with significant improvement in overall quality of life, self-image, physical symptoms, and general health in studies assessing change in quality of life.  PHQ-9: Recent Review Flowsheet Data    Depression screen [facility] 2/9 04/28/2020   Decreased Interest 0   Down, Depressed, Hopeless 0   PHQ - 2 Score 0   Altered sleeping 1   Tired, decreased energy 1   Change in appetite 0   Feeling bad or failure about yourself  0   Trouble concentrating 0   Moving slowly or fidgety/restless 0   Suicidal thoughts 0   PHQ-9 Score 2   Difficult doing work/chores Not difficult at all     Interpretation of Total Score  Total Score Depression Severity:  1-4 = Minimal depression, 5-9 = Mild depression, 10-14 = Moderate depression, 15-19 = Moderately severe depression, 20-27 = Severe depression   Psychosocial Evaluation and Intervention: Psychosocial Evaluation - 04/25/20 1312      Psychosocial Evaluation & Interventions   Comments  Mr. Pickney reports doing well. He stated he is on something to help with his stress given he's had a lot of health issues lately. He is looking forward to exercising. [facility] so he is ready to get back to feeling better.    Expected Outcomes  Short: attend [facility] for exercise and education. Long: maintain positive self care habits       Psychosocial Re-Evaluation:   Psychosocial Discharge (Final Psychosocial Re-Evaluation):   Vocational Rehabilitation: Provide vocational rehab  assistance to qualifying candidates.   Vocational Rehab Evaluation & Intervention: Vocational Rehab - 04/25/20 1307      Initial Vocational Rehab Evaluation & Intervention   Assessment shows need for Vocational Rehabilitation  No       Education: Education Goals: Education classes will be provided on a variety of topics geared toward better understanding of heart health and risk factor modification. Participant will state understanding/return demonstration of topics presented as noted by education test scores.  Learning Barriers/Preferences: Learning Barriers/Preferences - 04/25/20 1307      Learning Barriers/Preferences   Learning Barriers  None    Learning Preferences  None       General Cardiac Education Topics:  AED/CPR: - Group verbal and written instruction with the use of models to demonstrate the basic use of the AED with the basic ABC's of resuscitation.   Anatomy & Physiology of the Heart: - Group verbal and written instruction and models provide basic cardiac anatomy and physiology, with the coronary electrical and arterial systems. Review of Valvular disease and Heart Failure   Cardiac Procedures: - Group verbal and written instruction to review commonly prescribed medications for heart disease. Reviews the medication, class  of the drug, and side effects. Includes the steps to properly store meds and maintain the prescription regimen. (beta blockers and nitrates)   Cardiac Medications I: - Group verbal and written instruction to review commonly prescribed medications for heart disease. Reviews the medication, class of the drug, and side effects. Includes the steps to properly store meds and maintain the prescription regimen.   Cardiac Medications II: -Group verbal and written instruction to review commonly prescribed medications for heart disease. Reviews the medication, class of the drug, and side effects. (all other drug classes)    Go Sex-Intimacy & Heart  Disease, Get SMART - Goal Setting: - Group verbal and written instruction through game format to discuss heart disease and the return to sexual intimacy. Provides group verbal and written material to discuss and apply goal setting through the application of the S.M.A.R.T. Method.   Other Matters of the Heart: - Provides group verbal, written materials and models to describe Stable Angina and Peripheral Artery. Includes description of the disease process and treatment options available to the cardiac patient.   Infection Prevention: - Provides verbal and written material to individual with discussion of infection control including proper hand washing and proper equipment cleaning during exercise session.   Cardiac Rehab from 04/28/2020 in [facility] Cardiac and Pulmonary Rehab  Date  04/28/20  Educator  [facility]  Instruction Review Code  1- Verbalizes Understanding      Falls Prevention: - Provides verbal and written material to individual with discussion of falls prevention and safety.   Cardiac Rehab from 04/28/2020 in [facility] Cardiac and Pulmonary Rehab  Date  04/28/20  Educator  [facility]  Instruction Review Code  1- Verbalizes Understanding      Other: -Provides group and verbal instruction on various topics (see comments)   Knowledge Questionnaire Score: Knowledge Questionnaire Score - 04/28/20 1002      Knowledge Questionnaire Score   Pre Score  20/26 Education Focus: Pulse, depression, PAD, angina, exercise, nutrition       Core Components/Risk Factors/Patient Goals at Admission: Personal Goals and Risk Factors at Admission - 04/28/20 1003      Core Components/Risk Factors/Patient Goals on Admission    Weight Management  Yes;Weight Loss    Intervention  Weight Management: Develop a combined nutrition and exercise program designed to reach desired caloric intake, while maintaining appropriate intake of nutrient and fiber, sodium and fats, and appropriate energy expenditure required for the  weight goal.;Weight Management: Provide education and appropriate resources to help participant work on and attain dietary goals.    Admit Weight  213 lb 8 oz (96.8 kg)    Goal Weight: Short Term  208 lb (94.3 kg)    Goal Weight: Long Term  203 lb (92.1 kg)    Expected Outcomes  Short Term: Continue to assess and modify interventions until short term weight is achieved;Long Term: Adherence to nutrition and physical activity/exercise program aimed toward attainment of established weight goal;Weight Loss: Understanding of general recommendations for a balanced deficit meal plan, which promotes 1-2 lb weight loss per week and includes a negative energy balance of [phone] kcal/d;Understanding recommendations for meals to include 15-35% energy as protein, 25-35% energy from fat, 35-60% energy from carbohydrates, less than '200mg'$  of dietary cholesterol, 20-35 gm of total fiber daily;Understanding of distribution of calorie intake throughout the day with the consumption of 4-5 meals/snacks    Diabetes  Yes    Intervention  Provide education about signs/symptoms and action to take for hypo/hyperglycemia.;Provide education about  proper nutrition, including hydration, and aerobic/resistive exercise prescription along with prescribed medications to achieve blood glucose in normal ranges: Fasting glucose 65-99 mg/dL    Expected Outcomes  Short Term: Participant verbalizes understanding of the signs/symptoms and immediate care of hyper/hypoglycemia, proper foot care and importance of medication, aerobic/resistive exercise and nutrition plan for blood glucose control.;Long Term: Attainment of HbA1C < 7%.    Hypertension  Yes    Intervention  Provide education on lifestyle modifcations including regular physical activity/exercise, weight management, moderate sodium restriction and increased consumption of fresh fruit, vegetables, and low fat dairy, alcohol moderation, and smoking cessation.;Monitor prescription use  compliance.    Expected Outcomes  Short Term: Continued assessment and intervention until BP is < 140/30m HG in hypertensive participants. < 130/877mHG in hypertensive participants with diabetes, heart failure or chronic kidney disease.;Long Term: Maintenance of blood pressure at goal levels.    Lipids  Yes    Intervention  Provide education and support for participant on nutrition & aerobic/resistive exercise along with prescribed medications to achieve LDL '70mg'$ , HDL >'40mg'$ .    Expected Outcomes  Short Term: Participant states understanding of desired cholesterol values and is compliant with medications prescribed. Participant is following exercise prescription and nutrition guidelines.;Long Term: Cholesterol controlled with medications as prescribed, with individualized exercise RX and with personalized nutrition plan. Value goals: LDL < '70mg'$ , HDL > 40 mg.       Education:Diabetes - Individual verbal and written instruction to review signs/symptoms of diabetes, desired ranges of glucose level fasting, after meals and with exercise. Acknowledge that pre and post exercise glucose checks will be done for 3 sessions at entry of program.   Education: Know Your Numbers and Risk Factors: -Group verbal and written instruction about important numbers in your health.  Discussion of what are risk factors and how they play a role in the disease process.  Review of Cholesterol, Blood Pressure, Diabetes, and BMI and the role they play in your overall health.   Core Components/Risk Factors/Patient Goals Review:    Core Components/Risk Factors/Patient Goals at Discharge (Final Review):    ITP Comments: ITP Comments    Row Name 04/25/20 1315 04/28/20 0938 04/30/20 0610       ITP Comments  Initial telephone encounter complete. [facility] EP orientation scheduled for Monday 5/17 at 8:30  Completed 6MWT and gym orientation.  Initial ITP created and sent for review to Dr. MaEmily FilbertMedical Director.  30 Day review completed. ITP review done, changes made as directed,and approval shown by signature of  prScientist, research (life sciences)       Comments:

## 2020-04-30 NOTE — Progress Notes (Signed)
Daily Session Note  Patient Details  Name: Daniel Luna MRN: [id number] Date of Birth: [birth year] Referring Provider:     Cardiac Rehab from 04/28/2020 in [facility] Cardiac and Pulmonary Rehab  Referring Provider  Posey Boyer MD      Encounter Date: 04/30/2020  Check In: Session Check In - 04/30/20 1332      Check-In   Supervising physician immediately available to respond to emergencies  See telemetry face sheet for immediately available ER MD    Location  [facility]-Cardiac & Pulmonary Rehab    Staff Present  Renita Papa, RN BSN;Jessica Luan Pulling, MA, RCEP, CCRP, CCET;Melissa [place] RDN, LDN    Virtual Visit  No    Medication changes reported      No    Fall or balance concerns reported     No    Warm-up and Cool-down  Performed on first and last piece of equipment    Resistance Training Performed  Yes    VAD Patient?  No    PAD/SET Patient?  No      Pain Assessment   Currently in Pain?  No/denies          Social History   Tobacco Use  Smoking Status Former Smoker  . Packs/day: 2.00  . Years: 27.00  . Pack years: 54.00  . Types: Cigarettes  . Quit date: 12/21/1988  . Years since quitting: 31.3  Smokeless Tobacco Never Used    Goals Met:  Independence with exercise equipment Exercise tolerated well No report of cardiac concerns or symptoms Strength training completed today  Goals Unmet:  Not Applicable  Comments: First full day of exercise!  Patient was oriented to gym and equipment including functions, settings, policies, and procedures.  Patient's individual exercise prescription and treatment plan were reviewed.  All starting workloads were established based on the results of the 6 minute walk test done at initial orientation visit.  The plan for exercise progression was also introduced and progression will be customized based on patient's performance and goals.     Dr. Emily Filbert is Medical Director for [place] and [facility]  Pulmonary Rehabilitation.

## 2020-05-01 ENCOUNTER — Other Ambulatory Visit: Payer: Self-pay

## 2020-05-01 ENCOUNTER — Encounter: Payer: Medicare Other | Admitting: *Deleted

## 2020-05-01 DIAGNOSIS — I214 Non-ST elevation (NSTEMI) myocardial infarction: Secondary | ICD-10-CM

## 2020-05-01 LAB — GLUCOSE, CAPILLARY
Glucose-Capillary: 103 mg/dL — ABNORMAL HIGH (ref 70–99)
Glucose-Capillary: 86 mg/dL (ref 70–99)

## 2020-05-01 NOTE — Progress Notes (Signed)
Daily Session Note  Patient Details  Name: Daniel Luna MRN: [id number] Date of Birth: 03/13/1941 Referring Provider:     Cardiac Rehab from 04/28/2020 in [facility] Cardiac and Pulmonary Rehab  Referring Provider  Posey Boyer MD      Encounter Date: 05/01/2020  Check In: Session Check In - 05/01/20 1327      Check-In   Supervising physician immediately available to respond to emergencies  See telemetry face sheet for immediately available ER MD    Location  [facility]-Cardiac & Pulmonary Rehab    Staff Present  Renita Papa, RN Vickki Hearing, BA, ACSM CEP, Exercise Physiologist;Jessica Luan Pulling, MA, RCEP, CCRP, CCET    Virtual Visit  No    Medication changes reported      No    Fall or balance concerns reported     No    Warm-up and Cool-down  Performed on first and last piece of equipment    Resistance Training Performed  Yes    VAD Patient?  No    PAD/SET Patient?  No      Pain Assessment   Currently in Pain?  No/denies          Social History   Tobacco Use  Smoking Status Former Smoker  . Packs/day: 2.00  . Years: 27.00  . Pack years: 54.00  . Types: Cigarettes  . Quit date: 12/21/1988  . Years since quitting: 31.3  Smokeless Tobacco Never Used    Goals Met:  Independence with exercise equipment Exercise tolerated well No report of cardiac concerns or symptoms Strength training completed today  Goals Unmet:  Not Applicable  Comments: Pt able to follow exercise prescription today without complaint.  Will continue to monitor for progression.    Dr. Emily Filbert is Medical Director for [place] and [facility].

## 2020-05-05 ENCOUNTER — Encounter: Payer: Medicare Other | Admitting: *Deleted

## 2020-05-05 ENCOUNTER — Other Ambulatory Visit: Payer: Self-pay

## 2020-05-05 DIAGNOSIS — I214 Non-ST elevation (NSTEMI) myocardial infarction: Secondary | ICD-10-CM

## 2020-05-05 DIAGNOSIS — Z955 Presence of coronary angioplasty implant and graft: Secondary | ICD-10-CM

## 2020-05-05 LAB — GLUCOSE, CAPILLARY
Glucose-Capillary: 90 mg/dL (ref 70–99)
Glucose-Capillary: 86 mg/dL (ref 70–99)

## 2020-05-05 NOTE — Progress Notes (Signed)
Daily Session Note  Patient Details  Name: Daniel Luna MRN: [id number] Date of Birth: August 10, 1941 Referring Provider:     Cardiac Rehab from 04/28/2020 in [facility] Cardiac and Pulmonary Rehab  Referring Provider  Posey Boyer MD      Encounter Date: 05/05/2020  Check In: Session Check In - 05/05/20 1338      Check-In   Supervising physician immediately available to respond to emergencies  See telemetry face sheet for immediately available ER MD    Location  [facility]-Cardiac & Pulmonary Rehab    Staff Present  Renita Papa, RN BSN;Joseph [address] [place], [place], ACSM CEP, Exercise Physiologist;Melissa Caiola RDN, LDN    Virtual Visit  No    Medication changes reported      No    Fall or balance concerns reported     No    Warm-up and Cool-down  Performed on first and last piece of equipment    Resistance Training Performed  Yes    VAD Patient?  No    PAD/SET Patient?  No      Pain Assessment   Currently in Pain?  No/denies          Social History   Tobacco Use  Smoking Status Former Smoker  . Packs/day: 2.00  . Years: 27.00  . Pack years: 54.00  . Types: Cigarettes  . Quit date: 12/21/1988  . Years since quitting: 31.3  Smokeless Tobacco Never Used    Goals Met:  Independence with exercise equipment Exercise tolerated well No report of cardiac concerns or symptoms Strength training completed today  Goals Unmet:  Not Applicable  Comments: Pt able to follow exercise prescription today without complaint.  Will continue to monitor for progression.    Dr. Emily Filbert is Medical Director for [place] and [facility].

## 2020-05-07 ENCOUNTER — Encounter: Payer: Medicare Other | Admitting: *Deleted

## 2020-05-07 ENCOUNTER — Other Ambulatory Visit: Payer: Self-pay

## 2020-05-07 DIAGNOSIS — Z955 Presence of coronary angioplasty implant and graft: Secondary | ICD-10-CM

## 2020-05-07 DIAGNOSIS — I214 Non-ST elevation (NSTEMI) myocardial infarction: Secondary | ICD-10-CM | POA: Diagnosis not present

## 2020-05-07 NOTE — Progress Notes (Signed)
Daily Session Note  Patient Details  Name: DEONTEZ KLINKE MRN: [id number] Date of Birth: February 06, 1941 Referring Provider:     Cardiac Rehab from 04/28/2020 in [facility] Cardiac and Pulmonary Rehab  Referring Provider  Posey Boyer MD      Encounter Date: 05/07/2020  Check In: Session Check In - 05/07/20 1339      Check-In   Supervising physician immediately available to respond to emergencies  See telemetry face sheet for immediately available ER MD    Location  [facility]-Cardiac & Pulmonary Rehab    Staff Present  Renita Papa, RN BSN;Melissa Caiola RDN, Rowe Pavy, BA, ACSM CEP, Exercise Physiologist    Virtual Visit  No    Medication changes reported      No    Fall or balance concerns reported     No    Warm-up and Cool-down  Performed on first and last piece of equipment    Resistance Training Performed  Yes    VAD Patient?  No    PAD/SET Patient?  No      Pain Assessment   Currently in Pain?  No/denies          Social History   Tobacco Use  Smoking Status Former Smoker  . Packs/day: 2.00  . Years: 27.00  . Pack years: 54.00  . Types: Cigarettes  . Quit date: 12/21/1988  . Years since quitting: 31.3  Smokeless Tobacco Never Used    Goals Met:  Independence with exercise equipment Exercise tolerated well No report of cardiac concerns or symptoms Strength training completed today  Goals Unmet:  Not Applicable  Comments: Pt able to follow exercise prescription today without complaint.  Will continue to monitor for progression.    Dr. Emily Filbert is Medical Director for [place] and [facility].

## 2020-05-08 ENCOUNTER — Encounter: Payer: Medicare Other | Admitting: *Deleted

## 2020-05-08 ENCOUNTER — Other Ambulatory Visit: Payer: Self-pay

## 2020-05-08 DIAGNOSIS — I214 Non-ST elevation (NSTEMI) myocardial infarction: Secondary | ICD-10-CM

## 2020-05-08 DIAGNOSIS — Z955 Presence of coronary angioplasty implant and graft: Secondary | ICD-10-CM

## 2020-05-08 NOTE — Progress Notes (Signed)
Daily Session Note  Patient Details  Name: Daniel Luna MRN: [id number] Date of Birth: 1941/07/29 Referring Provider:     Cardiac Rehab from 04/28/2020 in [facility] Cardiac and Pulmonary Rehab  Referring Provider  Posey Boyer MD      Encounter Date: 05/08/2020  Check In: Session Check In - 05/08/20 1419      Check-In   Supervising physician immediately available to respond to emergencies  See telemetry face sheet for immediately available ER MD    Location  [facility]-Cardiac & Pulmonary Rehab    Staff Present  Justin Mend RCP,RRT,BSRT;Arianah Torgeson Sherryll Burger, RN BSN;Jessica Luan Pulling, MA, RCEP, CCRP, CCET    Virtual Visit  No    Medication changes reported      No    Fall or balance concerns reported     No    Warm-up and Cool-down  Performed on first and last piece of equipment    Resistance Training Performed  Yes    VAD Patient?  No    PAD/SET Patient?  No      Pain Assessment   Currently in Pain?  No/denies          Social History   Tobacco Use  Smoking Status Former Smoker  . Packs/day: 2.00  . Years: 27.00  . Pack years: 54.00  . Types: Cigarettes  . Quit date: 12/21/1988  . Years since quitting: 31.4  Smokeless Tobacco Never Used    Goals Met:  Independence with exercise equipment Exercise tolerated well No report of cardiac concerns or symptoms Strength training completed today  Goals Unmet:  Not Applicable  Comments: Pt able to follow exercise prescription today without complaint.  Will continue to monitor for progression.    Dr. Emily Filbert is Medical Director for [place] and [facility].

## 2020-05-14 ENCOUNTER — Other Ambulatory Visit: Payer: Self-pay

## 2020-05-14 ENCOUNTER — Encounter: Payer: Medicare Other | Attending: Internal Medicine | Admitting: *Deleted

## 2020-05-14 DIAGNOSIS — I5022 Chronic systolic (congestive) heart failure: Secondary | ICD-10-CM | POA: Diagnosis not present

## 2020-05-14 DIAGNOSIS — I214 Non-ST elevation (NSTEMI) myocardial infarction: Secondary | ICD-10-CM | POA: Insufficient documentation

## 2020-05-14 DIAGNOSIS — Z955 Presence of coronary angioplasty implant and graft: Secondary | ICD-10-CM | POA: Diagnosis not present

## 2020-05-14 NOTE — Progress Notes (Signed)
Daily Session Note  Patient Details  Name: Daniel Luna MRN: [id number] Date of Birth: [birth year] Referring Provider:     Cardiac Rehab from 04/28/2020 in [facility] Cardiac and Pulmonary Rehab  Referring Provider  Posey Boyer MD      Encounter Date: 05/14/2020  Check In: Session Check In - 05/14/20 1330      Check-In   Supervising physician immediately available to respond to emergencies  See telemetry face sheet for immediately available ER MD    Location  [facility]-Cardiac & Pulmonary Rehab    Staff Present  Renita Papa, RN BSN;Jessica Waggaman, MA, RCEP, CCRP, CCET;Amanda Sommer, [place], ACSM CEP, Exercise Physiologist    Virtual Visit  No    Medication changes reported      No    Fall or balance concerns reported     No    Warm-up and Cool-down  Performed on first and last piece of equipment    Resistance Training Performed  Yes    VAD Patient?  No    PAD/SET Patient?  No      Pain Assessment   Currently in Pain?  No/denies          Social History   Tobacco Use  Smoking Status Former Smoker  . Packs/day: 2.00  . Years: 27.00  . Pack years: 54.00  . Types: Cigarettes  . Quit date: 12/21/1988  . Years since quitting: 31.4  Smokeless Tobacco Never Used    Goals Met:  Independence with exercise equipment Exercise tolerated well No report of cardiac concerns or symptoms Strength training completed today  Goals Unmet:  Not Applicable  Comments: Pt able to follow exercise prescription today without complaint.  Will continue to monitor for progression.    Dr. Emily Filbert is Medical Director for [place] and [facility].

## 2020-05-15 ENCOUNTER — Other Ambulatory Visit: Payer: Self-pay

## 2020-05-15 ENCOUNTER — Encounter: Payer: Medicare Other | Admitting: *Deleted

## 2020-05-15 DIAGNOSIS — I214 Non-ST elevation (NSTEMI) myocardial infarction: Secondary | ICD-10-CM | POA: Diagnosis not present

## 2020-05-15 DIAGNOSIS — Z955 Presence of coronary angioplasty implant and graft: Secondary | ICD-10-CM

## 2020-05-15 NOTE — Progress Notes (Signed)
Daily Session Note  Patient Details  Name: Daniel Luna MRN: [id number] Date of Birth: Aug 03, 1941 Referring Provider:     Cardiac Rehab from 04/28/2020 in [facility] Cardiac and Pulmonary Rehab  Referring Provider  Posey Boyer MD      Encounter Date: 05/15/2020  Check In: Session Check In - 05/15/20 1326      Check-In   Supervising physician immediately available to respond to emergencies  See telemetry face sheet for immediately available ER MD    Location  [facility]-Cardiac & Pulmonary Rehab    Staff Present  Renita Papa, RN Margurite Auerbach, MS Exercise Physiologist;Laureen Owens Shark, BS, RRT, CPFT;Amanda Oletta Darter, BA, ACSM CEP, Exercise Physiologist    Virtual Visit  No    Medication changes reported      No    Fall or balance concerns reported     No    Warm-up and Cool-down  Performed on first and last piece of equipment    Resistance Training Performed  Yes    VAD Patient?  No    PAD/SET Patient?  No      Pain Assessment   Currently in Pain?  No/denies          Social History   Tobacco Use  Smoking Status Former Smoker  . Packs/day: 2.00  . Years: 27.00  . Pack years: 54.00  . Types: Cigarettes  . Quit date: 12/21/1988  . Years since quitting: 31.4  Smokeless Tobacco Never Used    Goals Met:  Independence with exercise equipment Exercise tolerated well No report of cardiac concerns or symptoms Strength training completed today  Goals Unmet:  Not Applicable  Comments: Pt able to follow exercise prescription today without complaint.  Will continue to monitor for progression.    Dr. Emily Filbert is Medical Director for [place] and [facility].

## 2020-05-19 ENCOUNTER — Other Ambulatory Visit: Payer: Self-pay

## 2020-05-19 DIAGNOSIS — I214 Non-ST elevation (NSTEMI) myocardial infarction: Secondary | ICD-10-CM

## 2020-05-19 NOTE — Progress Notes (Signed)
Daily Session Note  Patient Details  Name: Daniel Luna MRN: [id number] Date of Birth: [birth year] Referring Provider:     Cardiac Rehab from 04/28/2020 in [facility] Cardiac and Pulmonary Rehab  Referring Provider  Posey Boyer MD      Encounter Date: 05/19/2020  Check In: Session Check In - 05/19/20 1420      Check-In   Supervising physician immediately available to respond to emergencies  See telemetry face sheet for immediately available ER MD    Location  [facility]-Cardiac & Pulmonary Rehab    Staff Present  Basilia Jumbo, RN, BSN;Melissa Caiola RDN, LDN;Jessica Congress, MA, RCEP, CCRP, [place], BS, ACSM CEP, Exercise Physiologist;Kara Montura, [place] Exercise Physiologist;Joseph Tessie Fass RCP,RRT,BSRT    Virtual Visit  No    Medication changes reported      No    Fall or balance concerns reported     No    Tobacco Cessation  No Change    Warm-up and Cool-down  Performed on first and last piece of equipment    Resistance Training Performed  Yes    VAD Patient?  No      Pain Assessment   Currently in Pain?  No/denies          Social History   Tobacco Use  Smoking Status Former Smoker  . Packs/day: 2.00  . Years: 27.00  . Pack years: 54.00  . Types: Cigarettes  . Quit date: 12/21/1988  . Years since quitting: 31.4  Smokeless Tobacco Never Used    Goals Met:  Independence with exercise equipment Exercise tolerated well No report of cardiac concerns or symptoms  Goals Unmet:  Not Applicable  Comments: Pt able to follow exercise prescription today without complaint.  Will continue to monitor for progression.    Dr. Emily Filbert is Medical Director for [place] and [facility].

## 2020-05-21 ENCOUNTER — Encounter: Payer: Medicare Other | Admitting: *Deleted

## 2020-05-21 ENCOUNTER — Other Ambulatory Visit: Payer: Self-pay

## 2020-05-21 DIAGNOSIS — I214 Non-ST elevation (NSTEMI) myocardial infarction: Secondary | ICD-10-CM | POA: Diagnosis not present

## 2020-05-21 DIAGNOSIS — Z955 Presence of coronary angioplasty implant and graft: Secondary | ICD-10-CM

## 2020-05-21 NOTE — Progress Notes (Signed)
Daily Session Note  Patient Details  Name: Daniel Luna MRN: [id number] Date of Birth: 1941/11/06 Referring Provider:     Cardiac Rehab from 04/28/2020 in [facility] Cardiac and Pulmonary Rehab  Referring Provider  Posey Boyer MD      Encounter Date: 05/21/2020  Check In: Session Check In - 05/21/20 1352      Check-In   Supervising physician immediately available to respond to emergencies  See telemetry face sheet for immediately available ER MD    Location  [facility]-Cardiac & Pulmonary Rehab    Staff Present  Heath Lark, RN, BSN, CCRP;Laureen Owens Shark, BS, RRT, CPFT;Melissa Caiola RDN, LDN    Virtual Visit  No    Medication changes reported      No    Fall or balance concerns reported     No    Warm-up and Cool-down  Performed on first and last piece of equipment    Resistance Training Performed  No    VAD Patient?  No    PAD/SET Patient?  No      Pain Assessment   Currently in Pain?  No/denies          Social History   Tobacco Use  Smoking Status Former Smoker  . Packs/day: 2.00  . Years: 27.00  . Pack years: 54.00  . Types: Cigarettes  . Quit date: 12/21/1988  . Years since quitting: 31.4  Smokeless Tobacco Never Used    Goals Met:  Independence with exercise equipment Exercise tolerated well No report of cardiac concerns or symptoms  Goals Unmet:  Not Applicable  Comments: Pt able to follow exercise prescription today without complaint.  Will continue to monitor for progression.    Dr. Emily Filbert is Medical Director for [place] and [facility].

## 2020-05-22 ENCOUNTER — Other Ambulatory Visit: Payer: Self-pay

## 2020-05-22 DIAGNOSIS — I214 Non-ST elevation (NSTEMI) myocardial infarction: Secondary | ICD-10-CM

## 2020-05-22 NOTE — Progress Notes (Signed)
Daily Session Note  Patient Details  Name: TAVITA EASTHAM MRN: [id number] Date of Birth: [birth year] Referring Provider:     Cardiac Rehab from 04/28/2020 in [facility] Cardiac and Pulmonary Rehab  Referring Provider Posey Boyer MD      Encounter Date: 05/22/2020  Check In:  Session Check In - 05/22/20 1330      Check-In   Supervising physician immediately available to respond to emergencies See telemetry face sheet for immediately available ER MD    Location [facility]-Cardiac & Pulmonary Rehab    Staff Present Vida Rigger RN, BSN;Jessica Luan Pulling, MA, RCEP, CCRP, CCET;Joseph Hood RCP,RRT,BSRT    Virtual Visit No    Medication changes reported     No    Fall or balance concerns reported    No    Warm-up and Cool-down Performed on first and last piece of equipment    Resistance Training Performed Yes    VAD Patient? No    PAD/SET Patient? No      Pain Assessment   Currently in Pain? No/denies              Social History   Tobacco Use  Smoking Status Former Smoker  . Packs/day: 2.00  . Years: 27.00  . Pack years: 54.00  . Types: Cigarettes  . Quit date: 12/21/1988  . Years since quitting: 31.4  Smokeless Tobacco Never Used    Goals Met:  Proper associated with RPD/PD & O2 Sat Independence with exercise equipment Exercise tolerated well No report of cardiac concerns or symptoms Strength training completed today  Goals Unmet:  Not Applicable  Comments: Pt able to follow exercise prescription today without complaint.  Will continue to monitor for progression.   Dr. Emily Filbert is Medical Director for [place] and [facility].

## 2020-05-26 ENCOUNTER — Other Ambulatory Visit: Payer: Self-pay

## 2020-05-26 ENCOUNTER — Encounter: Payer: Medicare Other | Admitting: *Deleted

## 2020-05-26 DIAGNOSIS — Z955 Presence of coronary angioplasty implant and graft: Secondary | ICD-10-CM

## 2020-05-26 DIAGNOSIS — I214 Non-ST elevation (NSTEMI) myocardial infarction: Secondary | ICD-10-CM

## 2020-05-26 NOTE — Progress Notes (Signed)
Daily Session Note  Patient Details  Name: Daniel Luna MRN: [id number] Date of Birth: Oct 21, 1941 Referring Provider:     Cardiac Rehab from 04/28/2020 in [facility] Cardiac and Pulmonary Rehab  Referring Provider Posey Boyer MD      Encounter Date: 05/26/2020  Check In:  Session Check In - 05/26/20 1323      Check-In   Supervising physician immediately available to respond to emergencies See telemetry face sheet for immediately available ER MD    Location [facility]-Cardiac & Pulmonary Rehab    Staff Present Justin Mend RCP,RRT,BSRT;Citlaly Camplin Sherryll Burger, RN BSN;Melissa Caiola RDN, LDN    Virtual Visit No    Medication changes reported     No    Fall or balance concerns reported    No    Warm-up and Cool-down Performed on first and last piece of equipment    Resistance Training Performed Yes    VAD Patient? No    PAD/SET Patient? No      Pain Assessment   Currently in Pain? No/denies              Social History   Tobacco Use  Smoking Status Former Smoker  . Packs/day: 2.00  . Years: 27.00  . Pack years: 54.00  . Types: Cigarettes  . Quit date: 12/21/1988  . Years since quitting: 31.4  Smokeless Tobacco Never Used    Goals Met:  Independence with exercise equipment Exercise tolerated well No report of cardiac concerns or symptoms Strength training completed today  Goals Unmet:  Not Applicable  Comments: Pt able to follow exercise prescription today without complaint.  Will continue to monitor for progression.    Dr. Emily Filbert is Medical Director for [place] and [facility].

## 2020-05-28 ENCOUNTER — Encounter: Payer: Medicare Other | Admitting: *Deleted

## 2020-05-28 ENCOUNTER — Other Ambulatory Visit: Payer: Self-pay

## 2020-05-28 ENCOUNTER — Encounter: Payer: Self-pay | Admitting: *Deleted

## 2020-05-28 ENCOUNTER — Ambulatory Visit: Admit: 2020-05-28 | Discharge: 2020-05-29 | Payer: MEDICARE

## 2020-05-28 DIAGNOSIS — I214 Non-ST elevation (NSTEMI) myocardial infarction: Secondary | ICD-10-CM

## 2020-05-28 DIAGNOSIS — Z955 Presence of coronary angioplasty implant and graft: Secondary | ICD-10-CM

## 2020-05-28 MED ORDER — EVOLOCUMAB 140 MG/ML SUBCUTANEOUS PEN INJECTOR
SUBCUTANEOUS | 11 refills | 0 days | Status: CP
Start: 2020-05-28 — End: ?
  Filled 2020-07-10: qty 4, 56d supply, fill #0

## 2020-05-28 NOTE — Progress Notes (Signed)
Cardiac Individual Treatment Plan  Patient Details  Name: Daniel Luna MRN: [id number] Date of Birth: 08/05/1941 Referring Provider:     Cardiac Rehab from 04/28/2020 in [facility] Cardiac and Pulmonary Rehab  Referring Provider Posey Boyer MD      Initial Encounter Date:    Cardiac Rehab from 04/28/2020 in [facility] Cardiac and Pulmonary Rehab  Date 04/28/20      Visit Diagnosis: NSTEMI (non-ST elevated myocardial infarction) [facility])  Status post coronary artery stent placement  Patient's Home Medications on Admission:  Current Outpatient Medications:  .  ACCU-CHEK GUIDE test strip, , Disp: , Rfl:  .  acetaminophen (TYLENOL) 325 MG tablet, Take 2 tablets (650 mg total) by mouth every 4 (four) hours as needed for headache or mild pain., Disp:  , Rfl:  .  aspirin EC 81 MG EC tablet, Take 1 tablet (81 mg total) by mouth daily., Disp:  , Rfl:  .  atorvastatin (LIPITOR) 80 MG tablet, Take 1 tablet (80 mg total) by mouth daily., Disp:  , Rfl:  .  Calcium Polycarbophil (FIBER) 625 MG TABS, Take by mouth., Disp: , Rfl:  .  clopidogrel (PLAVIX) 75 MG tablet, Take 75 mg by mouth daily., Disp: , Rfl:  .  Cysteamine Bitartrate (PROCYSBI) 300 MG PACK, Use one strip to test blood sugars twice daily, Disp: , Rfl:  .  ENTRESTO 24-26 MG, Take 1 tablet by mouth 2 (two) times daily., Disp: , Rfl:  .  furosemide (LASIX) 10 MG/ML injection, Inject 2 mLs (20 mg total) into the vein 2 (two) times daily. (Patient not taking: Reported on 04/25/2020), Disp: 4 mL, Rfl: 0 .  heparin 25000-0.45 UT/250ML-% infusion, Inject 1,600 Units/hr into the vein continuous. (Patient not taking: Reported on 04/25/2020), Disp:  , Rfl:  .  insulin aspart (NOVOLOG) 100 UNIT/ML injection, Inject 0-5 Units into the skin at bedtime. (Patient not taking: Reported on 04/25/2020), Disp: 10 mL, Rfl: 11 .  insulin aspart (NOVOLOG) 100 UNIT/ML injection, Inject 0-9 Units into the skin 3 (three) times daily with meals. (Patient not taking: Reported  on 04/25/2020), Disp: 10 mL, Rfl: 11 .  JARDIANCE 10 MG TABS tablet, Take 10 mg by mouth daily., Disp: , Rfl:  .  losartan (COZAAR) 100 MG tablet, Take 1 tablet (100 mg total) by mouth daily. (Patient not taking: Reported on 04/25/2020), Disp:  , Rfl:  .  metFORMIN (GLUCOPHAGE) 1000 MG tablet, Take 1,000 mg by mouth 2 (two) times daily., Disp: , Rfl:  .  nitroGLYCERIN (NITROSTAT) 0.4 MG SL tablet, Place 1 tablet (0.4 mg total) under the tongue every 5 (five) minutes x 3 doses as needed for chest pain. (Patient not taking: Reported on 04/25/2020), Disp:  , Rfl: 12 .  sertraline (ZOLOFT) 50 MG tablet, Take 50 mg by mouth daily., Disp: , Rfl:   Past Medical History: Past Medical History:  Diagnosis Date  . Arthritis   . CHF (congestive heart failure) ([place])   . Coronary artery disease    CAD s/p PCI to LAD and RCA 01/2012   . Cyst of brain 1983  . Diabetes mellitus without complication ([place])   . Dysrhythmia   . Heart failure ([place])   . Hyperlipidemia   . Hypertension   . Ischemic cardiomyopathy    LVEF 32-54 % (systolic and diastolic, NYHA I-II symptoms)   . Macular degeneration   . Shortness of breath dyspnea     Tobacco Use: Social History   Tobacco Use  Smoking Status  Former Smoker  . Packs/day: 2.00  . Years: 27.00  . Pack years: 54.00  . Types: Cigarettes  . Quit date: 12/21/1988  . Years since quitting: 31.4  Smokeless Tobacco Never Used    Labs: Recent Review Scientist, physiological    Labs for ITP Cardiac and Pulmonary Rehab Latest Ref Rng & Units 05/07/2015 04/06/2020   Cholestrol 0 - 200 mg/dL - 127   LDLCALC 0 - 99 mg/dL - 81   HDL >40 mg/dL - 29(L)   Trlycerides <150 mg/dL - 83   Hemoglobin A1c 4.8 - 5.6 % 5.8 6.6(H)       Exercise Target Goals: Exercise Program Goal: Individual exercise prescription set using results from initial 6 min walk test and THRR while considering  patient's activity barriers and safety.   Exercise Prescription Goal: Initial exercise  prescription builds to 30-45 minutes a day of aerobic activity, 2-3 days per week.  Home exercise guidelines will be given to patient during program as part of exercise prescription that the participant will acknowledge.   Education: Aerobic Exercise & Resistance Training: - Gives group verbal and written instruction on the various components of exercise. Focuses on aerobic and resistive training programs and the benefits of this training and how to safely progress through these programs..   Education: Exercise & Equipment Safety: - Individual verbal instruction and demonstration of equipment use and safety with use of the equipment.   Cardiac Rehab from 04/28/2020 in [facility] Cardiac and Pulmonary Rehab  Date 04/28/20  Educator [facility]  Instruction Review Code 1- Verbalizes Understanding      Education: Exercise Physiology & General Exercise Guidelines: - Group verbal and written instruction with models to review the exercise physiology of the cardiovascular system and associated critical values. Provides general exercise guidelines with specific guidelines to those with heart or lung disease.    Education: Flexibility, Balance, Mind/Body Relaxation: Provides group verbal/written instruction on the benefits of flexibility and balance training, including mind/body exercise modes such as yoga, pilates and tai chi.  Demonstration and skill practice provided.   Activity Barriers & Risk Stratification:  Activity Barriers & Cardiac Risk Stratification - 04/28/20 0944      Activity Barriers & Cardiac Risk Stratification   Activity Barriers Left Knee Replacement;Right Knee Replacement;Balance Concerns;Muscular Weakness;Deconditioning    Cardiac Risk Stratification High           6 Minute Walk:  6 Minute Walk    Row Name 04/28/20 0943         6 Minute Walk   Phase Initial     Distance 1445 feet     Walk Time 6 minutes     # of Rest Breaks 0     MPH 2.74     METS 3.04     RPE 10     VO2  Peak 10.64     Symptoms Yes (comment)     Comments heavy gait on left side     Resting HR 78 bpm     Resting BP 128/74     Resting Oxygen Saturation  99 %     Exercise Oxygen Saturation  during 6 min walk 95 %     Max Ex. HR 95 bpm     Max Ex. BP 148/72     2 Minute Post BP 134/62            Oxygen Initial Assessment:   Oxygen Re-Evaluation:   Oxygen Discharge (Final Oxygen Re-Evaluation):   Initial Exercise Prescription:  Initial Exercise Prescription - 04/28/20 0900      Date of Initial Exercise RX and Referring Provider   Date 04/28/20    Referring Provider Posey Boyer MD      Treadmill   MPH 2.5    Grade 0.5    Minutes 15    METs 3.09      Recumbant Bike   Level 3    RPM 50    Watts 32    Minutes 15    METs 3      NuStep   Level 3    SPM 80    Minutes 15    METs 3      Recumbant Elliptical   Level 1    RPM 50    Minutes 15    METs 3      Prescription Details   Frequency (times per week) 3    Duration Progress to 30 minutes of continuous aerobic without signs/symptoms of physical distress      Intensity   THRR 40-80% of Max Heartrate 104-131    Ratings of Perceived Exertion 11-13    Perceived Dyspnea 0-4      Progression   Progression Continue to progress workloads to maintain intensity without signs/symptoms of physical distress.      Resistance Training   Training Prescription Yes    Weight 3 lb    Reps 10-15           Perform Capillary Blood Glucose checks as needed.  Exercise Prescription Changes:  Exercise Prescription Changes    Row Name 04/28/20 0900 05/06/20 1500 05/22/20 1200         Response to Exercise   Blood Pressure (Admit) 1'28/74 92/52 92/54 '$     Blood Pressure (Exercise) 148/72 110/66 130/66     Blood Pressure (Exit) 134/62 118/60 110/62     Heart Rate (Admit) 78 bpm 79 bpm 65 bpm     Heart Rate (Exercise) 95 bpm 106 bpm 106 bpm     Heart Rate (Exit) 82 bpm 81 bpm 77 bpm     Oxygen Saturation (Admit)  99 % -- --     Oxygen Saturation (Exercise) 95 % -- --     Rating of Perceived Exertion (Exercise) '10 12 13     '$ Symptoms heavy on left foot none none     Comments walk test results third full day of exercise --     Duration -- Continue with 30 min of aerobic exercise without signs/symptoms of physical distress. Continue with 30 min of aerobic exercise without signs/symptoms of physical distress.     Intensity -- THRR unchanged THRR unchanged       Progression   Progression -- Continue to progress workloads to maintain intensity without signs/symptoms of physical distress. Continue to progress workloads to maintain intensity without signs/symptoms of physical distress.     Average METs -- 2.39 2.62       Resistance Training   Training Prescription -- Yes Yes     Weight -- 3 lb 3 lb     Reps -- 10-15 10-15       Interval Training   Interval Training -- No No       Treadmill   MPH -- 2.5 2.6     Grade -- 0.5 0.5     Minutes -- 15 15     METs -- 3.09 3.17       Recumbant Bike   Level -- 3 3  Minutes -- 15 15     METs -- 2.3 3       NuStep   Level -- 3 3     Minutes -- 15 15     METs -- 2.5 3.3       Recumbant Elliptical   Level -- 1 3     Minutes -- 15 15     METs -- 1.7 1.7            Exercise Comments:   Exercise Goals and Review:  Exercise Goals    Row Name 04/28/20 0948             Exercise Goals   Increase Physical Activity Yes       Intervention Provide advice, education, support and counseling about physical activity/exercise needs.;Develop an individualized exercise prescription for aerobic and resistive training based on initial evaluation findings, risk stratification, comorbidities and participant's personal goals.       Expected Outcomes Short Term: Attend rehab on a regular basis to increase amount of physical activity.;Long Term: Add in home exercise to make exercise part of routine and to increase amount of physical activity.;Long Term:  Exercising regularly at least 3-5 days a week.       Increase Strength and Stamina Yes       Intervention Provide advice, education, support and counseling about physical activity/exercise needs.;Develop an individualized exercise prescription for aerobic and resistive training based on initial evaluation findings, risk stratification, comorbidities and participant's personal goals.       Expected Outcomes Short Term: Increase workloads from initial exercise prescription for resistance, speed, and METs.;Long Term: Improve cardiorespiratory fitness, muscular endurance and strength as measured by increased METs and functional capacity (6MWT);Short Term: Perform resistance training exercises routinely during rehab and add in resistance training at home       Able to understand and use rate of perceived exertion (RPE) scale Yes       Intervention Provide education and explanation on how to use RPE scale       Expected Outcomes Short Term: Able to use RPE daily in rehab to express subjective intensity level;Long Term:  Able to use RPE to guide intensity level when exercising independently       Able to understand and use Dyspnea scale Yes       Intervention Provide education and explanation on how to use Dyspnea scale       Expected Outcomes Short Term: Able to use Dyspnea scale daily in rehab to express subjective sense of shortness of breath during exertion;Long Term: Able to use Dyspnea scale to guide intensity level when exercising independently       Knowledge and understanding of Target Heart Rate Range (THRR) Yes       Intervention Provide education and explanation of THRR including how the numbers were predicted and where they are located for reference       Expected Outcomes Short Term: Able to state/look up THRR;Short Term: Able to use daily as guideline for intensity in rehab;Long Term: Able to use THRR to govern intensity when exercising independently       Able to check pulse independently Yes        Intervention Provide education and demonstration on how to check pulse in carotid and radial arteries.;Review the importance of being able to check your own pulse for safety during independent exercise       Expected Outcomes Short Term: Able to explain why pulse checking is important during independent  exercise;Long Term: Able to check pulse independently and accurately       Understanding of Exercise Prescription Yes       Intervention Provide education, explanation, and written materials on patient's individual exercise prescription       Expected Outcomes Short Term: Able to explain program exercise prescription;Long Term: Able to explain home exercise prescription to exercise independently              Exercise Goals Re-Evaluation :  Exercise Goals Re-Evaluation    Row Name 04/30/20 1335 05/06/20 1501 05/21/20 1336         Exercise Goal Re-Evaluation   Exercise Goals Review Increase Physical Activity;Able to understand and use rate of perceived exertion (RPE) scale;Knowledge and understanding of Target Heart Rate Range (THRR);Understanding of Exercise Prescription;Increase Strength and Stamina;Able to check pulse independently Increase Physical Activity;Increase Strength and Stamina;Understanding of Exercise Prescription Increase Physical Activity;Increase Strength and Stamina;Understanding of Exercise Prescription     Comments Reviewed RPE and dyspnea scales, THR and program prescription with pt today.  Pt voiced understanding and was given a copy of goals to take home. Jonni Sanger is off to a good start in rehab.  He has completed his first 3 full days of exericse.  He has had some issues with his sugar dropping after exercise and we will continue to monitor it. Jonni Sanger is doing well in rehab.  He is already starting to feel better and has more energy.  We will review his home exercise guidelines soon.     Expected Outcomes Short: Use RPE daily to regulate intensity. Long: Follow program  prescription in THR. Short: Continue to attend regularly Long: Continue to follow program prescription Short: Review home exercise guidelines Long: Continue to improve stamina.            Discharge Exercise Prescription (Final Exercise Prescription Changes):  Exercise Prescription Changes - 05/22/20 1200      Response to Exercise   Blood Pressure (Admit) 92/54    Blood Pressure (Exercise) 130/66    Blood Pressure (Exit) 110/62    Heart Rate (Admit) 65 bpm    Heart Rate (Exercise) 106 bpm    Heart Rate (Exit) 77 bpm    Rating of Perceived Exertion (Exercise) 13    Symptoms none    Duration Continue with 30 min of aerobic exercise without signs/symptoms of physical distress.    Intensity THRR unchanged      Progression   Progression Continue to progress workloads to maintain intensity without signs/symptoms of physical distress.    Average METs 2.62      Resistance Training   Training Prescription Yes    Weight 3 lb    Reps 10-15      Interval Training   Interval Training No      Treadmill   MPH 2.6    Grade 0.5    Minutes 15    METs 3.17      Recumbant Bike   Level 3    Minutes 15    METs 3      NuStep   Level 3    Minutes 15    METs 3.3      Recumbant Elliptical   Level 3    Minutes 15    METs 1.7           Nutrition:  Target Goals: Understanding of nutrition guidelines, daily intake of sodium '1500mg'$ , cholesterol '200mg'$ , calories 30% from fat and 7% or less from saturated fats, daily to have  5 or more servings of fruits and vegetables.  Education: Controlling Sodium/Reading Food Labels -Group verbal and written material supporting the discussion of sodium use in heart healthy nutrition. Review and explanation with models, verbal and written materials for utilization of the food label.   Education: General Nutrition Guidelines/Fats and Fiber: -Group instruction provided by verbal, written material, models and posters to present the general guidelines  for heart healthy nutrition. Gives an explanation and review of dietary fats and fiber.   Biometrics:  Pre Biometrics - 04/28/20 1000      Pre Biometrics   Height 6' 2.5" (1.892 m)    Weight 213 lb 8 oz (96.8 kg)    BMI (Calculated) 27.05    Single Leg Stand 1.81 seconds            Nutrition Therapy Plan and Nutrition Goals:  Nutrition Therapy & Goals - 05/26/20 1431      Nutrition Therapy   Diet heart heathly, low Na, diabetes friendly    Protein (specify units) 75-80g    Fiber 30 grams    Whole Grain Foods 3 servings    Saturated Fats 12 max. grams    Fruits and Vegetables 5 servings/day    Sodium 1.5 grams      Personal Nutrition Goals   Nutrition Goal LT: get back to activites and wlaking 6000 steps.    Comments 64oz. 6.8 a1c, now BG 120-130. coffee with splenda and nonfat creamer. Cereal -nonfat milk, honey bunches of oats with almonds and banana and will have it with strawberries. Weekends will have waffles with some syrup or [place] sausage and eggs with toast. L: sandwich with low sodium ham or chicken with low fat mayo or mustard with carrot sticks with piece of fruit and diet soda. D: wife will cook dinner. protein vegetable and starch- uses pam or canola oil spray and sometimes olive oil and unsalted butter mixture. Lots of salad - olive oil and vinegar. will drink water for the rest of the day. usually will drink water. S: nuts, chocolate pudding, granola bar. uses whole wheat bread or rye or oatmeal bread.      Intervention Plan   Intervention Prescribe, educate and counsel regarding individualized specific dietary modifications aiming towards targeted core components such as weight, hypertension, lipid management, diabetes, heart failure and other comorbidities.;Nutrition handout(s) given to patient.    Expected Outcomes Short Term Goal: Understand basic principles of dietary content, such as calories, fat, sodium, cholesterol and nutrients.;Short Term Goal: A plan has  been developed with personal nutrition goals set during dietitian appointment.;Long Term Goal: Adherence to prescribed nutrition plan.           Nutrition Assessments:  Nutrition Assessments - 04/28/20 1000      MEDFICTS Scores   Pre Score 24           MEDIFICTS Score Key:          ?70 Need to make dietary changes          40-70 Heart Healthy Diet         ? 40 Therapeutic Level Cholesterol Diet  Nutrition Goals Re-Evaluation:   Nutrition Goals Discharge (Final Nutrition Goals Re-Evaluation):   Psychosocial: Target Goals: Acknowledge presence or absence of significant depression and/or stress, maximize coping skills, provide positive support system. Participant is able to verbalize types and ability to use techniques and skills needed for reducing stress and depression.   Education: Depression - Provides group verbal and written instruction on the  correlation between heart/lung disease and depressed mood, treatment options, and the stigmas associated with seeking treatment.   Education: Sleep Hygiene -Provides group verbal and written instruction about how sleep can affect your health.  Define sleep hygiene, discuss sleep cycles and impact of sleep habits. Review good sleep hygiene tips.     Education: Stress and Anxiety: - Provides group verbal and written instruction about the health risks of elevated stress and causes of high stress.  Discuss the correlation between heart/lung disease and anxiety and treatment options. Review healthy ways to manage with stress and anxiety.    Initial Review & Psychosocial Screening:  Initial Psych Review & Screening - 04/25/20 1307      Initial Review   Current issues with Current Stress Concerns      Family Dynamics   Good Support System? Yes      Barriers   Psychosocial barriers to participate in program There are no identifiable barriers or psychosocial needs.;The patient should benefit from training in stress management and  relaxation.      Screening Interventions   Interventions Encouraged to exercise;To provide support and resources with identified psychosocial needs;Provide feedback about the scores to participant    Expected Outcomes Short Term goal: Utilizing psychosocial counselor, staff and physician to assist with identification of specific Stressors or current issues interfering with healing process. Setting desired goal for each stressor or current issue identified.;Long Term Goal: Stressors or current issues are controlled or eliminated.;Short Term goal: Identification and review with participant of any Quality of Life or Depression concerns found by scoring the questionnaire.;Long Term goal: The participant improves quality of Life and PHQ9 Scores as seen by post scores and/or verbalization of changes           Quality of Life Scores:   Quality of Life - 04/28/20 1000      Quality of Life   Select Quality of Life      Quality of Life Scores   Health/Function Pre 15.96 %    Socioeconomic Pre 23.75 %    Psych/Spiritual Pre 26.14 %    Family Pre 28.8 %    GLOBAL Pre 22.9 %          Scores of 19 and below usually indicate a poorer quality of life in these areas.  A difference of  2-3 points is a clinically meaningful difference.  A difference of 2-3 points in the total score of the Quality of Life Index has been associated with significant improvement in overall quality of life, self-image, physical symptoms, and general health in studies assessing change in quality of life.  PHQ-9: Recent Review Flowsheet Data    Depression screen [facility] 2/9 04/28/2020   Decreased Interest 0   Down, Depressed, Hopeless 0   PHQ - 2 Score 0   Altered sleeping 1   Tired, decreased energy 1   Change in appetite 0   Feeling bad or failure about yourself  0   Trouble concentrating 0   Moving slowly or fidgety/restless 0   Suicidal thoughts 0   PHQ-9 Score 2   Difficult doing work/chores Not difficult at all      Interpretation of Total Score  Total Score Depression Severity:  1-4 = Minimal depression, 5-9 = Mild depression, 10-14 = Moderate depression, 15-19 = Moderately severe depression, 20-27 = Severe depression   Psychosocial Evaluation and Intervention:  Psychosocial Evaluation - 04/25/20 1312      Psychosocial Evaluation & Interventions   Comments Mr. Vandermeulen  reports doing well. He stated he is on something to help with his stress given he's had a lot of health issues lately. He is looking forward to exercising. [facility] so he is ready to get back to feeling better.    Expected Outcomes Short: attend [facility] for exercise and education. Long: maintain positive self care habits           Psychosocial Re-Evaluation:  Psychosocial Re-Evaluation    [place] Name 05/21/20 1338             Psychosocial Re-Evaluation   Current issues with None Identified       Comments Jonni Sanger is doing well in rehab. He feels that he is in a good place mentally.  He has been able to get back to doing his yard work and more around the house.  He is sleeping well.       Expected Outcomes Short: Continue to attend rehab regularly Long: Continue to improve overall well being              Psychosocial Discharge (Final Psychosocial Re-Evaluation):  Psychosocial Re-Evaluation - 05/21/20 1338      Psychosocial Re-Evaluation   Current issues with None Identified    Comments Jonni Sanger is doing well in rehab. He feels that he is in a good place mentally.  He has been able to get back to doing his yard work and more around the house.  He is sleeping well.    Expected Outcomes Short: Continue to attend rehab regularly Long: Continue to improve overall well being           Vocational Rehabilitation: Provide vocational rehab assistance to qualifying candidates.   Vocational Rehab Evaluation & Intervention:  Vocational Rehab - 04/25/20 1307      Initial Vocational Rehab Evaluation &  Intervention   Assessment shows need for Vocational Rehabilitation No           Education: Education Goals: Education classes will be provided on a variety of topics geared toward better understanding of heart health and risk factor modification. Participant will state understanding/return demonstration of topics presented as noted by education test scores.  Learning Barriers/Preferences:  Learning Barriers/Preferences - 04/25/20 1307      Learning Barriers/Preferences   Learning Barriers None    Learning Preferences None           General Cardiac Education Topics:  AED/CPR: - Group verbal and written instruction with the use of models to demonstrate the basic use of the AED with the basic ABC's of resuscitation.   Anatomy & Physiology of the Heart: - Group verbal and written instruction and models provide basic cardiac anatomy and physiology, with the coronary electrical and arterial systems. Review of Valvular disease and Heart Failure   Cardiac Procedures: - Group verbal and written instruction to review commonly prescribed medications for heart disease. Reviews the medication, class of the drug, and side effects. Includes the steps to properly store meds and maintain the prescription regimen. (beta blockers and nitrates)   Cardiac Medications I: - Group verbal and written instruction to review commonly prescribed medications for heart disease. Reviews the medication, class of the drug, and side effects. Includes the steps to properly store meds and maintain the prescription regimen.   Cardiac Medications II: -Group verbal and written instruction to review commonly prescribed medications for heart disease. Reviews the medication, class of the drug, and side effects. (all other drug classes)    Go  Sex-Intimacy & Heart Disease, Get SMART - Goal Setting: - Group verbal and written instruction through game format to discuss heart disease and the return to sexual intimacy.  Provides group verbal and written material to discuss and apply goal setting through the application of the S.M.A.R.T. Method.   Other Matters of the Heart: - Provides group verbal, written materials and models to describe Stable Angina and Peripheral Artery. Includes description of the disease process and treatment options available to the cardiac patient.   Infection Prevention: - Provides verbal and written material to individual with discussion of infection control including proper hand washing and proper equipment cleaning during exercise session.   Cardiac Rehab from 04/28/2020 in [facility] Cardiac and Pulmonary Rehab  Date 04/28/20  Educator [facility]  Instruction Review Code 1- Verbalizes Understanding      Falls Prevention: - Provides verbal and written material to individual with discussion of falls prevention and safety.   Cardiac Rehab from 04/28/2020 in [facility] Cardiac and Pulmonary Rehab  Date 04/28/20  Educator [facility]  Instruction Review Code 1- Verbalizes Understanding      Other: -Provides group and verbal instruction on various topics (see comments)   Knowledge Questionnaire Score:  Knowledge Questionnaire Score - 04/28/20 1002      Knowledge Questionnaire Score   Pre Score 20/26 Education Focus: Pulse, depression, PAD, angina, exercise, nutrition           Core Components/Risk Factors/Patient Goals at Admission:  Personal Goals and Risk Factors at Admission - 04/28/20 1003      Core Components/Risk Factors/Patient Goals on Admission    Weight Management Yes;Weight Loss    Intervention Weight Management: Develop a combined nutrition and exercise program designed to reach desired caloric intake, while maintaining appropriate intake of nutrient and fiber, sodium and fats, and appropriate energy expenditure required for the weight goal.;Weight Management: Provide education and appropriate resources to help participant work on and attain dietary goals.    Admit Weight 213 lb 8  oz (96.8 kg)    Goal Weight: Short Term 208 lb (94.3 kg)    Goal Weight: Long Term 203 lb (92.1 kg)    Expected Outcomes Short Term: Continue to assess and modify interventions until short term weight is achieved;Long Term: Adherence to nutrition and physical activity/exercise program aimed toward attainment of established weight goal;Weight Loss: Understanding of general recommendations for a balanced deficit meal plan, which promotes 1-2 lb weight loss per week and includes a negative energy balance of [phone] kcal/d;Understanding recommendations for meals to include 15-35% energy as protein, 25-35% energy from fat, 35-60% energy from carbohydrates, less than '200mg'$  of dietary cholesterol, 20-35 gm of total fiber daily;Understanding of distribution of calorie intake throughout the day with the consumption of 4-5 meals/snacks    Diabetes Yes    Intervention Provide education about signs/symptoms and action to take for hypo/hyperglycemia.;Provide education about proper nutrition, including hydration, and aerobic/resistive exercise prescription along with prescribed medications to achieve blood glucose in normal ranges: Fasting glucose 65-99 mg/dL    Expected Outcomes Short Term: Participant verbalizes understanding of the signs/symptoms and immediate care of hyper/hypoglycemia, proper foot care and importance of medication, aerobic/resistive exercise and nutrition plan for blood glucose control.;Long Term: Attainment of HbA1C < 7%.    Hypertension Yes    Intervention Provide education on lifestyle modifcations including regular physical activity/exercise, weight management, moderate sodium restriction and increased consumption of fresh fruit, vegetables, and low fat dairy, alcohol moderation, and smoking cessation.;Monitor prescription use compliance.  Expected Outcomes Short Term: Continued assessment and intervention until BP is < 140/36m HG in hypertensive participants. < 130/837mHG in hypertensive  participants with diabetes, heart failure or chronic kidney disease.;Long Term: Maintenance of blood pressure at goal levels.    Lipids Yes    Intervention Provide education and support for participant on nutrition & aerobic/resistive exercise along with prescribed medications to achieve LDL '70mg'$ , HDL >'40mg'$ .    Expected Outcomes Short Term: Participant states understanding of desired cholesterol values and is compliant with medications prescribed. Participant is following exercise prescription and nutrition guidelines.;Long Term: Cholesterol controlled with medications as prescribed, with individualized exercise RX and with personalized nutrition plan. Value goals: LDL < '70mg'$ , HDL > 40 mg.           Education:Diabetes - Individual verbal and written instruction to review signs/symptoms of diabetes, desired ranges of glucose level fasting, after meals and with exercise. Acknowledge that pre and post exercise glucose checks will be done for 3 sessions at entry of program.   Education: Know Your Numbers and Risk Factors: -Group verbal and written instruction about important numbers in your health.  Discussion of what are risk factors and how they play a role in the disease process.  Review of Cholesterol, Blood Pressure, Diabetes, and BMI and the role they play in your overall health.   Core Components/Risk Factors/Patient Goals Review:   Goals and Risk Factor Review    Row Name 05/21/20 1340             Core Components/Risk Factors/Patient Goals Review   Personal Goals Review Weight Management/Obesity;Hypertension;Diabetes;Lipids       Review AnJonni Sangers doing well in rehab.  He has lost some weight already.  He is very good at keeping a close eye on the his numbers.  He weighs daily and also checks his blood pressures and blood sugars routinely throughout the day.  So far, his numbers are doing well and his blood sugars are at the lowest point in a few years!       Expected Outcomes Short:  Continue to work on weight loss Long: Continue to monitor risk factors.              Core Components/Risk Factors/Patient Goals at Discharge (Final Review):   Goals and Risk Factor Review - 05/21/20 1340      Core Components/Risk Factors/Patient Goals Review   Personal Goals Review Weight Management/Obesity;Hypertension;Diabetes;Lipids    Review AnJonni Sangers doing well in rehab.  He has lost some weight already.  He is very good at keeping a close eye on the his numbers.  He weighs daily and also checks his blood pressures and blood sugars routinely throughout the day.  So far, his numbers are doing well and his blood sugars are at the lowest point in a few years!    Expected Outcomes Short: Continue to work on weight loss Long: Continue to monitor risk factors.           ITP Comments:  ITP Comments    Row Name 04/25/20 1315 04/28/20 0938 04/30/20 0610 04/30/20 1334 05/28/20 0616   ITP Comments Initial telephone encounter complete. [facility] EP orientation scheduled for Monday 5/17 at 8:30 Completed 6MWT and gym orientation.  Initial ITP created and sent for review to Dr. MaEmily FilbertMedical Director. 30 Day review completed. ITP review done, changes made as directed,and approval shown by signature of  prScientist, research (life sciences)First full day of exercise!  Patient was oriented to gym and equipment including functions, settings, policies, and procedures.  Patient's individual exercise prescription and treatment plan were reviewed.  All starting workloads were established based on the results of the 6 minute walk test done at initial orientation visit.  The plan for exercise progression was also introduced and progression will be customized based on patient's performance and goals. 30 Day review completed. Medical Director ITP review done, changes made as directed, and signed approval by Medical Director.          Comments: 30 Day review completed. Medical Director ITP  review done, changes made as directed, and signed approval by Medical Director.

## 2020-05-28 NOTE — Progress Notes (Signed)
Daily Session Note  Patient Details  Name: Daniel Luna MRN: [id number] Date of Birth: 11/15/1941 Referring Provider:     Cardiac Rehab from 04/28/2020 in [facility] Cardiac and Pulmonary Rehab  Referring Provider Cavender, Matthew MD      Encounter Date: 05/28/2020  Check In:  Session Check In - 05/28/20 1324      Check-In   Supervising physician immediately available to respond to emergencies See telemetry face sheet for immediately available ER MD    Location [facility]-Cardiac & Pulmonary Rehab    Staff Present Meredith Craven, RN BSN;Melissa Caiola RDN, LDN;Amanda Sommer, BA, ACSM CEP, Exercise Physiologist    Virtual Visit No    Medication changes reported     No    Fall or balance concerns reported    No    Warm-up and Cool-down Performed on first and last piece of equipment    Resistance Training Performed Yes    VAD Patient? No    PAD/SET Patient? No      Pain Assessment   Currently in Pain? No/denies              Social History   Tobacco Use  Smoking Status Former Smoker  . Packs/day: 2.00  . Years: 27.00  . Pack years: 54.00  . Types: Cigarettes  . Quit date: 12/21/1988  . Years since quitting: 31.4  Smokeless Tobacco Never Used    Goals Met:  Independence with exercise equipment Exercise tolerated well No report of cardiac concerns or symptoms Strength training completed today  Goals Unmet:  Not Applicable  Comments: Pt able to follow exercise prescription today without complaint.  Will continue to monitor for progression.    Dr. Mark Miller is Medical Director for [facility] and [facility]. 

## 2020-05-29 ENCOUNTER — Encounter: Payer: Medicare Other | Admitting: *Deleted

## 2020-05-29 ENCOUNTER — Other Ambulatory Visit: Payer: Self-pay

## 2020-05-29 DIAGNOSIS — I214 Non-ST elevation (NSTEMI) myocardial infarction: Secondary | ICD-10-CM

## 2020-05-29 DIAGNOSIS — Z955 Presence of coronary angioplasty implant and graft: Secondary | ICD-10-CM

## 2020-05-29 NOTE — Progress Notes (Signed)
Daily Session Note  Patient Details  Name: Daniel Luna MRN: [id number] Date of Birth: 09/15/1941 Referring Provider:     Cardiac Rehab from 04/28/2020 in [facility] Cardiac and Pulmonary Rehab  Referring Provider Posey Boyer MD      Encounter Date: 05/29/2020  Check In:  Session Check In - 05/29/20 1331      Check-In   Supervising physician immediately available to respond to emergencies See telemetry face sheet for immediately available ER MD    Location [facility]-Cardiac & Pulmonary Rehab    Staff Present Renita Papa, RN BSN;Joseph [address] [place], [place], [place], CCRP, CCET    Virtual Visit No    Medication changes reported     No    Fall or balance concerns reported    No    Warm-up and Cool-down Performed on first and last piece of equipment    Resistance Training Performed Yes    VAD Patient? No    PAD/SET Patient? No      Pain Assessment   Currently in Pain? No/denies              Social History   Tobacco Use  Smoking Status Former Smoker  . Packs/day: 2.00  . Years: 27.00  . Pack years: 54.00  . Types: Cigarettes  . Quit date: 12/21/1988  . Years since quitting: 31.4  Smokeless Tobacco Never Used    Goals Met:  Independence with exercise equipment Exercise tolerated well No report of cardiac concerns or symptoms  Goals Unmet:  Not Applicable  Comments: Pt able to follow exercise prescription today without complaint.  Will continue to monitor for progression.  Reviewed home exercise with pt today.  Pt plans to walk and use staff videos for exercise.  He also belongs to the [employer] but is thinking about joining the [employer].  Reviewed THR, pulse, RPE, sign and symptoms, pulse oximetery and when to call 911 or MD.  Also discussed weather considerations and indoor options.  Pt voiced understanding.  Dr. Emily Filbert is Medical Director for [place] and [facility].

## 2020-06-02 ENCOUNTER — Encounter: Payer: Medicare Other | Admitting: *Deleted

## 2020-06-02 ENCOUNTER — Other Ambulatory Visit: Payer: Self-pay

## 2020-06-02 DIAGNOSIS — Z955 Presence of coronary angioplasty implant and graft: Secondary | ICD-10-CM

## 2020-06-02 DIAGNOSIS — I214 Non-ST elevation (NSTEMI) myocardial infarction: Secondary | ICD-10-CM

## 2020-06-02 NOTE — Progress Notes (Signed)
Daily Session Note  Patient Details  Name: Daniel Luna MRN: [id number] Date of Birth: August 09, 1941 Referring Provider:     Cardiac Rehab from 04/28/2020 in [facility] Cardiac and Pulmonary Rehab  Referring Provider Posey Boyer MD      Encounter Date: 06/02/2020  Check In:  Session Check In - 06/02/20 1327      Check-In   Supervising physician immediately available to respond to emergencies See telemetry face sheet for immediately available ER MD    Location [facility]-Cardiac & Pulmonary Rehab    Staff Present Renita Papa, RN BSN;Joseph Hood RCP,RRT,BSRT;Melissa WaKe[place]y RDN, LDN    Virtual Visit No    Medication changes reported     No    Fall or balance concerns reported    No    Warm-up and Cool-down Performed on first and last piece of equipment    Resistance Training Performed Yes    VAD Patient? No    PAD/SET Patient? No      Pain Assessment   Currently in Pain? No/denies              Social History   Tobacco Use  Smoking Status Former Smoker  . Packs/day: 2.00  . Years: 27.00  . Pack years: 54.00  . Types: Cigarettes  . Quit date: 12/21/1988  . Years since quitting: 31.4  Smokeless Tobacco Never Used    Goals Met:  Independence with exercise equipment Exercise tolerated well No report of cardiac concerns or symptoms Strength training completed today  Goals Unmet:  Not Applicable  Comments: Pt able to follow exercise prescription today without complaint.  Will continue to monitor for progression.    Dr. Emily Filbert is Medical Director for [place] and [facility].

## 2020-06-04 ENCOUNTER — Encounter: Payer: Medicare Other | Admitting: *Deleted

## 2020-06-04 ENCOUNTER — Other Ambulatory Visit: Payer: Self-pay

## 2020-06-04 DIAGNOSIS — Z955 Presence of coronary angioplasty implant and graft: Secondary | ICD-10-CM

## 2020-06-04 DIAGNOSIS — I214 Non-ST elevation (NSTEMI) myocardial infarction: Secondary | ICD-10-CM

## 2020-06-04 NOTE — Progress Notes (Signed)
Daily Session Note  Patient Details  Name: Daniel Luna MRN: [id number] Date of Birth: 30-Aug-1941 Referring Provider:     Cardiac Rehab from 04/28/2020 in [facility] Cardiac and Pulmonary Rehab  Referring Provider Posey Boyer MD      Encounter Date: 06/04/2020  Check In:  Session Check In - 06/04/20 1339      Check-In   Supervising physician immediately available to respond to emergencies See telemetry face sheet for immediately available ER MD    Location [facility]-Cardiac & Pulmonary Rehab    Staff Present Renita Papa, RN BSN;Jessica [place], MA, RCEP, CCRP, CCET;Melissa Seagrove RDN, [place], [place], ACSM CEP, Exercise Physiologist    Virtual Visit No    Medication changes reported     No    Fall or balance concerns reported    No    Warm-up and Cool-down Performed on first and last piece of equipment    Resistance Training Performed Yes    VAD Patient? No    PAD/SET Patient? No      Pain Assessment   Currently in Pain? No/denies              Social History   Tobacco Use  Smoking Status Former Smoker  . Packs/day: 2.00  . Years: 27.00  . Pack years: 54.00  . Types: Cigarettes  . Quit date: 12/21/1988  . Years since quitting: 31.4  Smokeless Tobacco Never Used    Goals Met:  Independence with exercise equipment Exercise tolerated well No report of cardiac concerns or symptoms Strength training completed today  Goals Unmet:  Not Applicable  Comments: Pt able to follow exercise prescription today without complaint.  Will continue to monitor for progression.    Dr. Emily Filbert is Medical Director for [place] and [facility].

## 2020-06-05 ENCOUNTER — Other Ambulatory Visit: Payer: Self-pay

## 2020-06-05 ENCOUNTER — Encounter: Payer: Medicare Other | Admitting: *Deleted

## 2020-06-05 DIAGNOSIS — I214 Non-ST elevation (NSTEMI) myocardial infarction: Secondary | ICD-10-CM

## 2020-06-05 DIAGNOSIS — Z955 Presence of coronary angioplasty implant and graft: Secondary | ICD-10-CM

## 2020-06-05 NOTE — Progress Notes (Signed)
Daily Session Note  Patient Details  Name: Daniel Luna MRN: [id number] Date of Birth: 1941/10/11 Referring Provider:     Cardiac Rehab from 04/28/2020 in [facility] Cardiac and Pulmonary Rehab  Referring Provider Posey Boyer MD      Encounter Date: 06/05/2020  Check In:  Session Check In - 06/05/20 1330      Check-In   Supervising physician immediately available to respond to emergencies See telemetry face sheet for immediately available ER MD    Location [facility]-Cardiac & Pulmonary Rehab    Staff Present Renita Papa, RN BSN;Joseph [address] [place], [place], [place], CCRP, CCET    Virtual Visit No    Medication changes reported     No    Fall or balance concerns reported    No    Warm-up and Cool-down Performed on first and last piece of equipment    Resistance Training Performed Yes    VAD Patient? No    PAD/SET Patient? No      Pain Assessment   Currently in Pain? No/denies              Social History   Tobacco Use  Smoking Status Former Smoker   Packs/day: 2.00   Years: 27.00   Pack years: 54.00   Types: Cigarettes   Quit date: 12/21/1988   Years since quitting: 31.4  Smokeless Tobacco Never Used    Goals Met:  Independence with exercise equipment Exercise tolerated well No report of cardiac concerns or symptoms Strength training completed today  Goals Unmet:  Not Applicable  Comments: Pt able to follow exercise prescription today without complaint.  Will continue to monitor for progression.    Dr. Emily Filbert is Medical Director for [place] and [facility].

## 2020-06-11 ENCOUNTER — Encounter: Payer: Medicare Other | Admitting: *Deleted

## 2020-06-11 ENCOUNTER — Other Ambulatory Visit: Payer: Self-pay

## 2020-06-11 DIAGNOSIS — I214 Non-ST elevation (NSTEMI) myocardial infarction: Secondary | ICD-10-CM

## 2020-06-11 DIAGNOSIS — Z955 Presence of coronary angioplasty implant and graft: Secondary | ICD-10-CM

## 2020-06-11 NOTE — Progress Notes (Signed)
Daily Session Note  Patient Details  Name: Daniel Luna MRN: [id number] Date of Birth: 1941-01-23 Referring Provider:     Cardiac Rehab from 04/28/2020 in [facility] Cardiac and Pulmonary Rehab  Referring Provider Posey Boyer MD      Encounter Date: 06/11/2020  Check In:  Session Check In - 06/11/20 1346      Check-In   Supervising physician immediately available to respond to emergencies See telemetry face sheet for immediately available ER MD    Location [facility]-Cardiac & Pulmonary Rehab    Staff Present Renita Papa, RN BSN;Jessica Luan [place], MA, RCEP, CCRP, CCET;Laureen Onaway, BS, RRT, CPFT    Virtual Visit No    Medication changes reported     No    Fall or balance concerns reported    No    Warm-up and Cool-down Performed on first and last piece of equipment    Resistance Training Performed Yes    VAD Patient? No    PAD/SET Patient? No      Pain Assessment   Currently in Pain? No/denies              Social History   Tobacco Use  Smoking Status Former Smoker  . Packs/day: 2.00  . Years: 27.00  . Pack years: 54.00  . Types: Cigarettes  . Quit date: 12/21/1988  . Years since quitting: 31.4  Smokeless Tobacco Never Used    Goals Met:  Independence with exercise equipment Exercise tolerated well No report of cardiac concerns or symptoms Strength training completed today  Goals Unmet:  Not Applicable  Comments: Pt able to follow exercise prescription today without complaint.  Will continue to monitor for progression.    Dr. Emily Filbert is Medical Director for [place] and [facility].

## 2020-06-12 ENCOUNTER — Encounter: Payer: Medicare Other | Attending: Internal Medicine | Admitting: *Deleted

## 2020-06-12 ENCOUNTER — Other Ambulatory Visit: Payer: Self-pay

## 2020-06-12 DIAGNOSIS — I214 Non-ST elevation (NSTEMI) myocardial infarction: Secondary | ICD-10-CM

## 2020-06-12 DIAGNOSIS — Z955 Presence of coronary angioplasty implant and graft: Secondary | ICD-10-CM | POA: Insufficient documentation

## 2020-06-12 NOTE — Progress Notes (Signed)
Daily Session Note  Patient Details  Name: SAMARI GORBY MRN: [id number] Date of Birth: June 16, 1941 Referring Provider:     Cardiac Rehab from 04/28/2020 in [facility] Cardiac and Pulmonary Rehab  Referring Provider Posey Boyer MD      Encounter Date: 06/12/2020  Check In:  Session Check In - 06/12/20 1333      Check-In   Supervising physician immediately available to respond to emergencies See telemetry face sheet for immediately available ER MD    Location [facility]-Cardiac & Pulmonary Rehab    Staff Present Renita Papa, RN BSN;Joseph [address] [place], [place], [place], CCRP, Marylynn Pearson, [place] Exercise Physiologist    Virtual Visit No    Medication changes reported     No    Fall or balance concerns reported    No    Warm-up and Cool-down Performed on first and last piece of equipment    Resistance Training Performed Yes    VAD Patient? No    PAD/SET Patient? No      Pain Assessment   Currently in Pain? No/denies              Social History   Tobacco Use  Smoking Status Former Smoker  . Packs/day: 2.00  . Years: 27.00  . Pack years: 54.00  . Types: Cigarettes  . Quit date: 12/21/1988  . Years since quitting: 31.4  Smokeless Tobacco Never Used    Goals Met:  Independence with exercise equipment Exercise tolerated well No report of cardiac concerns or symptoms Strength training completed today  Goals Unmet:  Not Applicable  Comments: Pt able to follow exercise prescription today without complaint.  Will continue to monitor for progression.    Dr. Emily Filbert is Medical Director for [place] and [facility].

## 2020-06-16 ENCOUNTER — Other Ambulatory Visit: Payer: Self-pay | Admitting: Cardiovascular Disease

## 2020-06-17 NOTE — Unmapped (Signed)
[facility] Specialty Medication Onboarding    Specialty Medication: Repatha Sureclick pens 140mg /ml  Prior Authorization: Approved   Financial Assistance: No - patient must call for financial assistance per MAPs referral    Final Copay/Day Supply: $47 / 28 days    Insurance Restrictions: None     Notes to Pharmacist: Referral closed as MAPs reached their max number of attempts to patient to obtain additional information to continue copay assistance process.     The triage team has completed the benefits investigation and has determined that the patient is able to fill this medication at [facility]. Please contact the patient to complete the onboarding or follow up with the prescribing physician as needed.

## 2020-06-17 NOTE — Telephone Encounter (Signed)
Pt is taking Carvedilol 12.5 mg tablet bid. Medication shows D/C 4/29 not on pt's current medication list but on pt's last OV note. Pt mentioned that he was referred by Dr. Saunders Revel to [facility] for Bypass Surgery. We are his general cardiology office. Please advise if ok to refill.

## 2020-06-18 ENCOUNTER — Encounter: Payer: Medicare Other | Admitting: *Deleted

## 2020-06-18 ENCOUNTER — Other Ambulatory Visit: Payer: Self-pay

## 2020-06-18 ENCOUNTER — Ambulatory Visit: Admit: 2020-06-18 | Discharge: 2020-06-19 | Payer: MEDICARE

## 2020-06-18 DIAGNOSIS — I214 Non-ST elevation (NSTEMI) myocardial infarction: Secondary | ICD-10-CM | POA: Diagnosis not present

## 2020-06-18 DIAGNOSIS — Z955 Presence of coronary angioplasty implant and graft: Secondary | ICD-10-CM

## 2020-06-18 MED ORDER — CARVEDILOL 12.5 MG TABLET
ORAL_TABLET | Freq: Two times a day (BID) | ORAL | 3 refills | 90.00000 days | Status: CP
Start: 2020-06-18 — End: 2021-06-18

## 2020-06-18 NOTE — Telephone Encounter (Signed)
Lmovm to notify pt to have [facility] fill his Carvedilol 12.5 mg tablet bid.

## 2020-06-18 NOTE — Telephone Encounter (Signed)
Reviewed the patient's chart.  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  Per his discharge summary from [facility], coreg was being stopped (reason unclear to me).  He has since been followed at [facility] and it looks like his coreg was resumed at 12.5 mg BID.  He does not currently have an appointment to follow up with our office. Reviewed Care Everywhere office notes. Per Dr. Smith Robert (cardiology) 05/28/20 office note- at the bottom, it is listed that the patient has a follow up appointment scheduled for today (7/7) with Zachary George, NP.   The patient's coreg should be refilled by them at this time.

## 2020-06-18 NOTE — Progress Notes (Signed)
Daily Session Note  Patient Details  Name: Daniel Luna MRN: [id number] Date of Birth: 1941/08/08 Referring Provider:     Cardiac Rehab from 04/28/2020 in [facility]  Referring Provider Posey Boyer MD      Encounter Date: 06/18/2020  Check In:  Session Check In - 06/18/20 1416      Check-In   Supervising physician immediately available to respond to emergencies See telemetry face sheet for immediately available ER MD    Location [facility]    Staff Present Renita Papa, RN BSN;Joseph Lou Miner, [place] Exercise Physiologist;Jessica [place], [place], RCEP, CCRP, CCET    Virtual Visit No    Medication changes reported     No    Fall or balance concerns reported    No    Warm-up and Cool-down Performed on first and last piece of equipment    Resistance Training Performed Yes    VAD Patient? No    PAD/SET Patient? No      Pain Assessment   Currently in Pain? No/denies              Social History   Tobacco Use  Smoking Status Former Smoker   Packs/day: 2.00   Years: 27.00   Pack years: 54.00   Types: Cigarettes   Quit date: 12/21/1988   Years since quitting: 31.5  Smokeless Tobacco Never Used    Goals Met:  Independence with exercise equipment Exercise tolerated well No report of cardiac concerns or symptoms Strength training completed today  Goals Unmet:  Not Applicable  Comments: Pt able to follow exercise prescription today without complaint.  Will continue to monitor for progression.    Dr. Emily Filbert is Medical Director for [place] and [facility].

## 2020-06-19 ENCOUNTER — Encounter: Payer: Medicare Other | Admitting: *Deleted

## 2020-06-19 ENCOUNTER — Other Ambulatory Visit: Payer: Self-pay

## 2020-06-19 DIAGNOSIS — I214 Non-ST elevation (NSTEMI) myocardial infarction: Secondary | ICD-10-CM | POA: Diagnosis not present

## 2020-06-19 DIAGNOSIS — Z955 Presence of coronary angioplasty implant and graft: Secondary | ICD-10-CM

## 2020-06-19 NOTE — Progress Notes (Signed)
Daily Session Note  Patient Details  Name: Daniel Luna MRN: [id number] Date of Birth: 08/26/1941 Referring Provider:     Cardiac Rehab from 04/28/2020 in [facility]  Referring Provider Cavender, Matthew MD      Encounter Date: 06/19/2020  Check In:  Session Check In - 06/19/20 1409      Check-In   Supervising physician immediately available to respond to emergencies See telemetry face sheet for immediately available ER MD    Location [facility]    Staff Present Meredith Craven, RN BSN;Joseph Hood RCP,RRT,BSRT;Jessica Hawkins, MA, RCEP, CCRP, CCET    Virtual Visit No    Medication changes reported     No    Fall or balance concerns reported    No    Warm-up and Cool-down Performed on first and last piece of equipment    Resistance Training Performed Yes    VAD Patient? No    PAD/SET Patient? No      Pain Assessment   Currently in Pain? No/denies              Social History   Tobacco Use  Smoking Status Former Smoker  . Packs/day: 2.00  . Years: 27.00  . Pack years: 54.00  . Types: Cigarettes  . Quit date: 12/21/1988  . Years since quitting: 31.5  Smokeless Tobacco Never Used    Goals Met:  Independence with exercise equipment Exercise tolerated well No report of cardiac concerns or symptoms Strength training completed today  Goals Unmet:  Not Applicable  Comments: Pt able to follow exercise prescription today without complaint.  Will continue to monitor for progression.    Dr. Mark Miller is Medical Director for [facility] and [facility]. 

## 2020-06-23 ENCOUNTER — Encounter: Payer: Medicare Other | Admitting: *Deleted

## 2020-06-23 ENCOUNTER — Other Ambulatory Visit: Payer: Self-pay

## 2020-06-23 DIAGNOSIS — I214 Non-ST elevation (NSTEMI) myocardial infarction: Secondary | ICD-10-CM | POA: Diagnosis not present

## 2020-06-23 DIAGNOSIS — Z955 Presence of coronary angioplasty implant and graft: Secondary | ICD-10-CM

## 2020-06-23 NOTE — Progress Notes (Signed)
Daily Session Note  Patient Details  Name: Daniel Luna MRN: [id number] Date of Birth: 09-24-1941 Referring Provider:     Cardiac Rehab from 04/28/2020 in [facility]  Referring Provider Posey Boyer MD      Encounter Date: 06/23/2020  Check In:  Session Check In - 06/23/20 1415      Check-In   Supervising physician immediately available to respond to emergencies See telemetry face sheet for immediately available ER MD    Location [facility]    Staff Present Renita Papa, RN Margurite Auerbach, MS Exercise Physiologist;Kelly Amedeo Plenty, BS, ACSM CEP, Exercise Physiologist;Amanda Oletta Darter, BA, ACSM CEP, Exercise Physiologist    Virtual Visit No    Medication changes reported     No    Fall or balance concerns reported    No    Warm-up and Cool-down Performed on first and last piece of equipment    Resistance Training Performed Yes    VAD Patient? No    PAD/SET Patient? No      Pain Assessment   Currently in Pain? No/denies              Social History   Tobacco Use  Smoking Status Former Smoker  . Packs/day: 2.00  . Years: 27.00  . Pack years: 54.00  . Types: Cigarettes  . Quit date: 12/21/1988  . Years since quitting: 31.5  Smokeless Tobacco Never Used    Goals Met:  Independence with exercise equipment Exercise tolerated well No report of cardiac concerns or symptoms Strength training completed today  Goals Unmet:  Not Applicable  Comments: Pt able to follow exercise prescription today without complaint.  Will continue to monitor for progression.    Dr. Emily Filbert is Medical Director for [place] and [facility].

## 2020-06-25 ENCOUNTER — Other Ambulatory Visit: Payer: Self-pay

## 2020-06-25 ENCOUNTER — Encounter: Payer: Self-pay | Admitting: *Deleted

## 2020-06-25 ENCOUNTER — Encounter: Payer: Medicare Other | Admitting: *Deleted

## 2020-06-25 DIAGNOSIS — Z955 Presence of coronary angioplasty implant and graft: Secondary | ICD-10-CM

## 2020-06-25 DIAGNOSIS — I214 Non-ST elevation (NSTEMI) myocardial infarction: Secondary | ICD-10-CM

## 2020-06-25 NOTE — Progress Notes (Signed)
Cardiac Individual Treatment Plan  Patient Details  Name: Daniel Luna MRN: [id number] Date of Birth: 08/05/1941 Referring Provider:     Cardiac Rehab from 04/28/2020 in [facility] Cardiac and Pulmonary Rehab  Referring Provider Posey Boyer MD      Initial Encounter Date:    Cardiac Rehab from 04/28/2020 in [facility] Cardiac and Pulmonary Rehab  Date 04/28/20      Visit Diagnosis: NSTEMI (non-ST elevated myocardial infarction) [facility])  Status post coronary artery stent placement  Patient's Home Medications on Admission:  Current Outpatient Medications:  .  ACCU-CHEK GUIDE test strip, , Disp: , Rfl:  .  acetaminophen (TYLENOL) 325 MG tablet, Take 2 tablets (650 mg total) by mouth every 4 (four) hours as needed for headache or mild pain., Disp:  , Rfl:  .  aspirin EC 81 MG EC tablet, Take 1 tablet (81 mg total) by mouth daily., Disp:  , Rfl:  .  atorvastatin (LIPITOR) 80 MG tablet, Take 1 tablet (80 mg total) by mouth daily., Disp:  , Rfl:  .  Calcium Polycarbophil (FIBER) 625 MG TABS, Take by mouth., Disp: , Rfl:  .  clopidogrel (PLAVIX) 75 MG tablet, Take 75 mg by mouth daily., Disp: , Rfl:  .  Cysteamine Bitartrate (PROCYSBI) 300 MG PACK, Use one strip to test blood sugars twice daily, Disp: , Rfl:  .  ENTRESTO 24-26 MG, Take 1 tablet by mouth 2 (two) times daily., Disp: , Rfl:  .  furosemide (LASIX) 10 MG/ML injection, Inject 2 mLs (20 mg total) into the vein 2 (two) times daily. (Patient not taking: Reported on 04/25/2020), Disp: 4 mL, Rfl: 0 .  heparin 25000-0.45 UT/250ML-% infusion, Inject 1,600 Units/hr into the vein continuous. (Patient not taking: Reported on 04/25/2020), Disp:  , Rfl:  .  insulin aspart (NOVOLOG) 100 UNIT/ML injection, Inject 0-5 Units into the skin at bedtime. (Patient not taking: Reported on 04/25/2020), Disp: 10 mL, Rfl: 11 .  insulin aspart (NOVOLOG) 100 UNIT/ML injection, Inject 0-9 Units into the skin 3 (three) times daily with meals. (Patient not taking: Reported  on 04/25/2020), Disp: 10 mL, Rfl: 11 .  JARDIANCE 10 MG TABS tablet, Take 10 mg by mouth daily., Disp: , Rfl:  .  losartan (COZAAR) 100 MG tablet, Take 1 tablet (100 mg total) by mouth daily. (Patient not taking: Reported on 04/25/2020), Disp:  , Rfl:  .  metFORMIN (GLUCOPHAGE) 1000 MG tablet, Take 1,000 mg by mouth 2 (two) times daily., Disp: , Rfl:  .  nitroGLYCERIN (NITROSTAT) 0.4 MG SL tablet, Place 1 tablet (0.4 mg total) under the tongue every 5 (five) minutes x 3 doses as needed for chest pain. (Patient not taking: Reported on 04/25/2020), Disp:  , Rfl: 12 .  sertraline (ZOLOFT) 50 MG tablet, Take 50 mg by mouth daily., Disp: , Rfl:   Past Medical History: Past Medical History:  Diagnosis Date  . Arthritis   . CHF (congestive heart failure) ([place])   . Coronary artery disease    CAD s/p PCI to LAD and RCA 01/2012   . Cyst of brain 1983  . Diabetes mellitus without complication ([place])   . Dysrhythmia   . Heart failure ([place])   . Hyperlipidemia   . Hypertension   . Ischemic cardiomyopathy    LVEF 32-54 % (systolic and diastolic, NYHA I-II symptoms)   . Macular degeneration   . Shortness of breath dyspnea     Tobacco Use: Social History   Tobacco Use  Smoking Status  Former Smoker  . Packs/day: 2.00  . Years: 27.00  . Pack years: 54.00  . Types: Cigarettes  . Quit date: 12/21/1988  . Years since quitting: 31.5  Smokeless Tobacco Never Used    Labs: Recent Review Scientist, physiological    Labs for ITP Cardiac and Pulmonary Rehab Latest Ref Rng & Units 05/07/2015 04/06/2020   Cholestrol 0 - 200 mg/dL - 127   LDLCALC 0 - 99 mg/dL - 81   HDL >40 mg/dL - 29(L)   Trlycerides <150 mg/dL - 83   Hemoglobin A1c 4.8 - 5.6 % 5.8 6.6(H)       Exercise Target Goals: Exercise Program Goal: Individual exercise prescription set using results from initial 6 min walk test and THRR while considering  patient's activity barriers and safety.   Exercise Prescription Goal: Initial exercise  prescription builds to 30-45 minutes a day of aerobic activity, 2-3 days per week.  Home exercise guidelines will be given to patient during program as part of exercise prescription that the participant will acknowledge.   Education: Aerobic Exercise & Resistance Training: - Gives group verbal and written instruction on the various components of exercise. Focuses on aerobic and resistive training programs and the benefits of this training and how to safely progress through these programs..   Education: Exercise & Equipment Safety: - Individual verbal instruction and demonstration of equipment use and safety with use of the equipment.   Cardiac Rehab from 04/28/2020 in [facility] Cardiac and Pulmonary Rehab  Date 04/28/20  Educator [facility]  Instruction Review Code 1- Verbalizes Understanding      Education: Exercise Physiology & General Exercise Guidelines: - Group verbal and written instruction with models to review the exercise physiology of the cardiovascular system and associated critical values. Provides general exercise guidelines with specific guidelines to those with heart or lung disease.    Education: Flexibility, Balance, Mind/Body Relaxation: Provides group verbal/written instruction on the benefits of flexibility and balance training, including mind/body exercise modes such as yoga, pilates and tai chi.  Demonstration and skill practice provided.   Activity Barriers & Risk Stratification:  Activity Barriers & Cardiac Risk Stratification - 04/28/20 0944      Activity Barriers & Cardiac Risk Stratification   Activity Barriers Left Knee Replacement;Right Knee Replacement;Balance Concerns;Muscular Weakness;Deconditioning    Cardiac Risk Stratification High           6 Minute Walk:  6 Minute Walk    Row Name 04/28/20 0943         6 Minute Walk   Phase Initial     Distance 1445 feet     Walk Time 6 minutes     # of Rest Breaks 0     MPH 2.74     METS 3.04     RPE 10     VO2  Peak 10.64     Symptoms Yes (comment)     Comments heavy gait on left side     Resting HR 78 bpm     Resting BP 128/74     Resting Oxygen Saturation  99 %     Exercise Oxygen Saturation  during 6 min walk 95 %     Max Ex. HR 95 bpm     Max Ex. BP 148/72     2 Minute Post BP 134/62            Oxygen Initial Assessment:   Oxygen Re-Evaluation:   Oxygen Discharge (Final Oxygen Re-Evaluation):   Initial Exercise Prescription:  Initial Exercise Prescription - 04/28/20 0900      Date of Initial Exercise RX and Referring Provider   Date 04/28/20    Referring Provider Posey Boyer MD      Treadmill   MPH 2.5    Grade 0.5    Minutes 15    METs 3.09      Recumbant Bike   Level 3    RPM 50    Watts 32    Minutes 15    METs 3      NuStep   Level 3    SPM 80    Minutes 15    METs 3      Recumbant Elliptical   Level 1    RPM 50    Minutes 15    METs 3      Prescription Details   Frequency (times per week) 3    Duration Progress to 30 minutes of continuous aerobic without signs/symptoms of physical distress      Intensity   THRR 40-80% of Max Heartrate 104-131    Ratings of Perceived Exertion 11-13    Perceived Dyspnea 0-4      Progression   Progression Continue to progress workloads to maintain intensity without signs/symptoms of physical distress.      Resistance Training   Training Prescription Yes    Weight 3 lb    Reps 10-15           Perform Capillary Blood Glucose checks as needed.  Exercise Prescription Changes:  Exercise Prescription Changes    Row Name 04/28/20 0900 05/06/20 1500 05/22/20 1200 05/29/20 1400 06/02/20 1400     Response to Exercise   Blood Pressure (Admit) 1'28/74 92/52 92/54 '$ -- 102/60   Blood Pressure (Exercise) 148/72 110/66 130/66 -- 130/58   Blood Pressure (Exit) 134/62 118/60 110/62 -- 124/62   Heart Rate (Admit) 78 bpm 79 bpm 65 bpm -- 75 bpm   Heart Rate (Exercise) 95 bpm 106 bpm 106 bpm -- 103 bpm   Heart  Rate (Exit) 82 bpm 81 bpm 77 bpm -- 89 bpm   Oxygen Saturation (Admit) 99 % -- -- -- --   Oxygen Saturation (Exercise) 95 % -- -- -- --   Rating of Perceived Exertion (Exercise) '10 12 13 '$ -- 15   Symptoms heavy on left foot none none -- none   Comments walk test results third full day of exercise -- -- --   Duration -- Continue with 30 min of aerobic exercise without signs/symptoms of physical distress. Continue with 30 min of aerobic exercise without signs/symptoms of physical distress. -- Continue with 30 min of aerobic exercise without signs/symptoms of physical distress.   Intensity -- THRR unchanged THRR unchanged -- THRR unchanged     Progression   Progression -- Continue to progress workloads to maintain intensity without signs/symptoms of physical distress. Continue to progress workloads to maintain intensity without signs/symptoms of physical distress. -- Continue to progress workloads to maintain intensity without signs/symptoms of physical distress.   Average METs -- 2.39 2.62 -- 3.48     Resistance Training   Training Prescription -- Yes Yes -- Yes   Weight -- 3 lb 3 lb -- 4 lb   Reps -- 10-15 10-15 -- 10-15     Interval Training   Interval Training -- No No -- No     Treadmill   MPH -- 2.5 2.6 -- 2.8   Grade -- 0.5 0.5 -- 0.5   Minutes --  15 15 -- 15   METs -- 3.09 3.17 -- 3.72     Recumbant Bike   Level -- 3 3 -- 3   Minutes -- 15 15 -- 15   METs -- 2.3 3 -- 4.2     NuStep   Level -- 3 3 -- 4   Minutes -- 15 15 -- 15   METs -- 2.5 3.3 -- 4     Recumbant Elliptical   Level -- 1 3 -- 1   Minutes -- 15 15 -- 15   METs -- 1.7 1.7 -- 2     Home Exercise Plan   Plans to continue exercise at -- -- -- Home (comment)  walking, [employer], [employer], [employer] (comment)  walking, [employer], [employer], videos   Frequency -- -- -- Add 2 additional days to program exercise sessions. Add 2 additional days to program exercise sessions.   Initial Home Exercises Provided -- -- --  05/29/20 05/29/20   Row Name 06/18/20 1400             Response to Exercise   Blood Pressure (Admit) 128/74       Blood Pressure (Exercise) 146/64       Blood Pressure (Exit) 120/66       Heart Rate (Admit) 79 bpm       Heart Rate (Exercise) 118 bpm       Heart Rate (Exit) 91 bpm       Rating of Perceived Exertion (Exercise) 15       Symptoms none       Duration Continue with 30 min of aerobic exercise without signs/symptoms of physical distress.       Intensity THRR unchanged         Progression   Progression Continue to progress workloads to maintain intensity without signs/symptoms of physical distress.       Average METs 3.3         Resistance Training   Training Prescription Yes       Weight 4 lb       Reps 10-15         Treadmill   MPH 3       Grade 1.5       Minutes 15       METs 3.92         Recumbant Elliptical   Level 3       RPM 50       Minutes 15       METs 2.7         Home Exercise Plan   Plans to continue exercise at Home (comment)  walking, [employer], [employer], videos       Frequency Add 2 additional days to program exercise sessions.       Initial Home Exercises Provided 05/29/20              Exercise Comments:   Exercise Goals and Review:  Exercise Goals    Row Name 04/28/20 0948             Exercise Goals   Increase Physical Activity Yes       Intervention Provide advice, education, support and counseling about physical activity/exercise needs.;Develop an individualized exercise prescription for aerobic and resistive training based on initial evaluation findings, risk stratification, comorbidities and participant's personal goals.       Expected Outcomes Short Term: Attend rehab on a regular basis to increase amount of physical activity.;Long Term: Add in home  exercise to make exercise part of routine and to increase amount of physical activity.;Long Term: Exercising regularly at least 3-5 days a week.       Increase Strength and Stamina  Yes       Intervention Provide advice, education, support and counseling about physical activity/exercise needs.;Develop an individualized exercise prescription for aerobic and resistive training based on initial evaluation findings, risk stratification, comorbidities and participant's personal goals.       Expected Outcomes Short Term: Increase workloads from initial exercise prescription for resistance, speed, and METs.;Long Term: Improve cardiorespiratory fitness, muscular endurance and strength as measured by increased METs and functional capacity ( );Short Term: Perform resistance training exercises routinely during rehab and add in resistance training at home       Able to understand and use rate of perceived exertion (RPE) scale Yes       Intervention Provide education and explanation on how to use RPE scale       Expected Outcomes Short Term: Able to use RPE daily in rehab to express subjective intensity level;Long Term:  Able to use RPE to guide intensity level when exercising independently       Able to understand and use Dyspnea scale Yes       Intervention Provide education and explanation on how to use Dyspnea scale       Expected Outcomes Short Term: Able to use Dyspnea scale daily in rehab to express subjective sense of shortness of breath during exertion;Long Term: Able to use Dyspnea scale to guide intensity level when exercising independently       Knowledge and understanding of Target Heart Rate Range (THRR) Yes       Intervention Provide education and explanation of THRR including how the numbers were predicted and where they are located for reference       Expected Outcomes Short Term: Able to state/look up THRR;Short Term: Able to use daily as guideline for intensity in rehab;Long Term: Able to use THRR to govern intensity when exercising independently       Able to check pulse independently Yes       Intervention Provide education and demonstration on how to check pulse in  carotid and radial arteries.;Review the importance of being able to check your own pulse for safety during independent exercise       Expected Outcomes Short Term: Able to explain why pulse checking is important during independent exercise;Long Term: Able to check pulse independently and accurately       Understanding of Exercise Prescription Yes       Intervention Provide education, explanation, and written materials on patient's individual exercise prescription       Expected Outcomes Short Term: Able to explain program exercise prescription;Long Term: Able to explain home exercise prescription to exercise independently              Exercise Goals Re-Evaluation :  Exercise Goals Re-Evaluation    Row Name 04/30/20 1335 05/06/20 1501 05/21/20 1336 05/29/20 1400 06/02/20 1436     Exercise Goal Re-Evaluation   Exercise Goals Review Increase Physical Activity;Able to understand and use rate of perceived exertion (RPE) scale;Knowledge and understanding of Target Heart Rate Range (THRR);Understanding of Exercise Prescription;Increase Strength and Stamina;Able to check pulse independently Increase Physical Activity;Increase Strength and Stamina;Understanding of Exercise Prescription Increase Physical Activity;Increase Strength and Stamina;Understanding of Exercise Prescription Increase Physical Activity;Increase Strength and Stamina;Understanding of Exercise Prescription;Able to understand and use rate of perceived exertion (RPE) scale;Able to understand and use  Dyspnea scale;Knowledge and understanding of Target Heart Rate Range (THRR);Able to check pulse independently Increase Physical Activity;Increase Strength and Stamina;Understanding of Exercise Prescription   Comments Reviewed RPE and dyspnea scales, THR and program prescription with pt today.  Pt voiced understanding and was given a copy of goals to take home. Jonni Sanger is off to a good start in rehab.  He has completed his first 3 full days of  exericse.  He has had some issues with his sugar dropping after exercise and we will continue to monitor it. Jonni Sanger is doing well in rehab.  He is already starting to feel better and has more energy.  We will review his home exercise guidelines soon. Reviewed home exercise with pt today.  Pt plans to walk and use staff videos for exercise.  He also belongs to the [facility] but is thinking about joining the [employer].  Reviewed THR, pulse, RPE, sign and symptoms, pulse oximetery and when to call 911 or MD.  Also discussed weather considerations and indoor options.  Pt voiced understanding. Jonni Sanger is doing well in rehab.  He is up to 4.2 METs on the bike.  We will continue to monitor his progress.   Expected Outcomes Short: Use RPE daily to regulate intensity. Long: Follow program prescription in THR. Short: Continue to attend regularly Long: Continue to follow program prescription Short: Review home exercise guidelines Long: Continue to improve stamina. Short: Add in more exercise at home and decide on [employer] vs [employer].  Long: Continue to improve stamina. Short: Increase recumbent elliptical  Long: Conitnue to increase exercise at home.   [place] Name 06/18/20 1421             Exercise Goal Re-Evaluation   Exercise Goals Review Increase Physical Activity;Increase Strength and Stamina;Understanding of Exercise Prescription       Comments Jonni Sanger attends consistently and works in correct THR range.  Staff will encourage increasing weight for strength work.  He has increased workloads on TM and REL.       Expected Outcomes Short: increase to 5 lb for strength Long:  continue to improve MET level              Discharge Exercise Prescription (Final Exercise Prescription Changes):  Exercise Prescription Changes - 06/18/20 1400      Response to Exercise   Blood Pressure (Admit) 128/74    Blood Pressure (Exercise) 146/64    Blood Pressure (Exit) 120/66    Heart Rate (Admit) 79 bpm    Heart Rate (Exercise) 118 bpm     Heart Rate (Exit) 91 bpm    Rating of Perceived Exertion (Exercise) 15    Symptoms none    Duration Continue with 30 min of aerobic exercise without signs/symptoms of physical distress.    Intensity THRR unchanged      Progression   Progression Continue to progress workloads to maintain intensity without signs/symptoms of physical distress.    Average METs 3.3      Resistance Training   Training Prescription Yes    Weight 4 lb    Reps 10-15      Treadmill   MPH 3    Grade 1.5    Minutes 15    METs 3.92      Recumbant Elliptical   Level 3    RPM 50    Minutes 15    METs 2.7      Home Exercise Plan   Plans to continue exercise at Home (comment)   walking,  [employer], [employer], videos   Frequency Add 2 additional days to program exercise sessions.    Initial Home Exercises Provided 05/29/20           Nutrition:  Target Goals: Understanding of nutrition guidelines, daily intake of sodium '1500mg'$ , cholesterol '200mg'$ , calories 30% from fat and 7% or less from saturated fats, daily to have 5 or more servings of fruits and vegetables.  Education: Controlling Sodium/Reading Food Labels -Group verbal and written material supporting the discussion of sodium use in heart healthy nutrition. Review and explanation with models, verbal and written materials for utilization of the food label.   Education: General Nutrition Guidelines/Fats and Fiber: -Group instruction provided by verbal, written material, models and posters to present the general guidelines for heart healthy nutrition. Gives an explanation and review of dietary fats and fiber.   Biometrics:  Pre Biometrics - 04/28/20 1000      Pre Biometrics   Height 6' 2.5" (1.892 m)    Weight 213 lb 8 oz (96.8 kg)    BMI (Calculated) 27.05    Single Leg Stand 1.81 seconds            Nutrition Therapy Plan and Nutrition Goals:  Nutrition Therapy & Goals - 05/26/20 1431      Nutrition Therapy   Diet heart heathly, low Na,  diabetes friendly    Protein (specify units) 75-80g    Fiber 30 grams    Whole Grain Foods 3 servings    Saturated Fats 12 max. grams    Fruits and Vegetables 5 servings/day    Sodium 1.5 grams      Personal Nutrition Goals   Nutrition Goal LT: get back to activites and wlaking 6000 steps.    Comments 64oz. 6.8 a1c, now BG 120-130. coffee with splenda and nonfat creamer. Cereal -nonfat milk, honey bunches of oats with almonds and banana and will have it with strawberries. Weekends will have waffles with some syrup or [place] sausage and eggs with toast. L: sandwich with low sodium ham or chicken with low fat mayo or mustard with carrot sticks with piece of fruit and diet soda. D: wife will cook dinner. protein vegetable and starch- uses pam or canola oil spray and sometimes olive oil and unsalted butter mixture. Lots of salad - olive oil and vinegar. will drink water for the rest of the day. usually will drink water. S: nuts, chocolate pudding, granola bar. uses whole wheat bread or rye or oatmeal bread.      Intervention Plan   Intervention Prescribe, educate and counsel regarding individualized specific dietary modifications aiming towards targeted core components such as weight, hypertension, lipid management, diabetes, heart failure and other comorbidities.;Nutrition handout(s) given to patient.    Expected Outcomes Short Term Goal: Understand basic principles of dietary content, such as calories, fat, sodium, cholesterol and nutrients.;Short Term Goal: A plan has been developed with personal nutrition goals set during dietitian appointment.;Long Term Goal: Adherence to prescribed nutrition plan.           Nutrition Assessments:  Nutrition Assessments - 04/28/20 1000      MEDFICTS Scores   Pre Score 24           MEDIFICTS Score Key:          ?70 Need to make dietary changes          40-70 Heart Healthy Diet         ? 40 Therapeutic Level Cholesterol Diet  Nutrition Goals  Re-Evaluation:  Nutrition Goals Re-Evaluation    [place] Name 06/05/20 1342             Goals   Current Weight 213 lb (96.6 kg)       Nutrition Goal Lose weight and keep lipids controlled.       Comment Jonni Sanger has lost some weight and wants to continue. He has been dieting and wants to get down to 200lbs.       Expected Outcome Short: lose more weight with diet. Long: maintain a heart healthy diet              Nutrition Goals Discharge (Final Nutrition Goals Re-Evaluation):  Nutrition Goals Re-Evaluation - 06/05/20 1342      Goals   Current Weight 213 lb (96.6 kg)    Nutrition Goal Lose weight and keep lipids controlled.    Comment Jonni Sanger has lost some weight and wants to continue. He has been dieting and wants to get down to 200lbs.    Expected Outcome Short: lose more weight with diet. Long: maintain a heart healthy diet           Psychosocial: Target Goals: Acknowledge presence or absence of significant depression and/or stress, maximize coping skills, provide positive support system. Participant is able to verbalize types and ability to use techniques and skills needed for reducing stress and depression.   Education: Depression - Provides group verbal and written instruction on the correlation between heart/lung disease and depressed mood, treatment options, and the stigmas associated with seeking treatment.   Education: Sleep Hygiene -Provides group verbal and written instruction about how sleep can affect your health.  Define sleep hygiene, discuss sleep cycles and impact of sleep habits. Review good sleep hygiene tips.     Education: Stress and Anxiety: - Provides group verbal and written instruction about the health risks of elevated stress and causes of high stress.  Discuss the correlation between heart/lung disease and anxiety and treatment options. Review healthy ways to manage with stress and anxiety.    Initial Review & Psychosocial Screening:  Initial Psych  Review & Screening - 04/25/20 1307      Initial Review   Current issues with Current Stress Concerns      Family Dynamics   Good Support System? Yes      Barriers   Psychosocial barriers to participate in program There are no identifiable barriers or psychosocial needs.;The patient should benefit from training in stress management and relaxation.      Screening Interventions   Interventions Encouraged to exercise;To provide support and resources with identified psychosocial needs;Provide feedback about the scores to participant    Expected Outcomes Short Term goal: Utilizing psychosocial counselor, staff and physician to assist with identification of specific Stressors or current issues interfering with healing process. Setting desired goal for each stressor or current issue identified.;Long Term Goal: Stressors or current issues are controlled or eliminated.;Short Term goal: Identification and review with participant of any Quality of Life or Depression concerns found by scoring the questionnaire.;Long Term goal: The participant improves quality of Life and PHQ9 Scores as seen by post scores and/or verbalization of changes           Quality of Life Scores:   Quality of Life - 04/28/20 1000      Quality of Life   Select Quality of Life      Quality of Life Scores   Health/Function Pre 15.96 %    Socioeconomic Pre 23.75 %    Psych/Spiritual  Pre 26.14 %    Family Pre 28.8 %    GLOBAL Pre 22.9 %          Scores of 19 and below usually indicate a poorer quality of life in these areas.  A difference of  2-3 points is a clinically meaningful difference.  A difference of 2-3 points in the total score of the Quality of Life Index has been associated with significant improvement in overall quality of life, self-image, physical symptoms, and general health in studies assessing change in quality of life.  PHQ-9: Recent Review Flowsheet Data    Depression screen [facility] 2/9 04/28/2020    Decreased Interest 0   Down, Depressed, Hopeless 0   PHQ - 2 Score 0   Altered sleeping 1   Tired, decreased energy 1   Change in appetite 0   Feeling bad or failure about yourself  0   Trouble concentrating 0   Moving slowly or fidgety/restless 0   Suicidal thoughts 0   PHQ-9 Score 2   Difficult doing work/chores Not difficult at all     Interpretation of Total Score  Total Score Depression Severity:  1-4 = Minimal depression, 5-9 = Mild depression, 10-14 = Moderate depression, 15-19 = Moderately severe depression, 20-27 = Severe depression   Psychosocial Evaluation and Intervention:  Psychosocial Evaluation - 04/25/20 1312      Psychosocial Evaluation & Interventions   Comments Mr. Hulsebus reports doing well. He stated he is on something to help with his stress given he's had a lot of health issues lately. He is looking forward to exercising. [facility] so he is ready to get back to feeling better.    Expected Outcomes Short: attend [facility] for exercise and education. Long: maintain positive self care habits           Psychosocial Re-Evaluation:  Psychosocial Re-Evaluation    [place] Name 05/21/20 1338 06/05/20 1338           Psychosocial Re-Evaluation   Current issues with None Identified Current Anxiety/Panic      Comments Jonni Sanger is doing well in rehab. He feels that he is in a good place mentally.  He has been able to get back to doing his yard work and more around the house.  He is sleeping well. Sometimes he has some anxiety. He is still getting used to being retired. He has been married for 14 years and sometimes still has arguments with his wife.      Expected Outcomes Short: Continue to attend rehab regularly Long: Continue to improve overall well being Short: continue to exercise to reduce stress. Long: maintain a stress free environment.      Interventions -- Encouraged to attend Cardiac Rehabilitation for the exercise      Continue  Psychosocial Services  -- Follow up required by staff             Psychosocial Discharge (Final Psychosocial Re-Evaluation):  Psychosocial Re-Evaluation - 06/05/20 1338      Psychosocial Re-Evaluation   Current issues with Current Anxiety/Panic    Comments Sometimes he has some anxiety. He is still getting used to being retired. He has been married for 57 years and sometimes still has arguments with his wife.    Expected Outcomes Short: continue to exercise to reduce stress. Long: maintain a stress free environment.    Interventions Encouraged to attend Cardiac Rehabilitation for the exercise    Continue Psychosocial Services  Follow  up required by staff           Vocational Rehabilitation: Provide vocational rehab assistance to qualifying candidates.   Vocational Rehab Evaluation & Intervention:  Vocational Rehab - 04/25/20 1307      Initial Vocational Rehab Evaluation & Intervention   Assessment shows need for Vocational Rehabilitation No           Education: Education Goals: Education classes will be provided on a variety of topics geared toward better understanding of heart health and risk factor modification. Participant will state understanding/return demonstration of topics presented as noted by education test scores.  Learning Barriers/Preferences:  Learning Barriers/Preferences - 04/25/20 1307      Learning Barriers/Preferences   Learning Barriers None    Learning Preferences None           General Cardiac Education Topics:  AED/CPR: - Group verbal and written instruction with the use of models to demonstrate the basic use of the AED with the basic ABC's of resuscitation.   Anatomy & Physiology of the Heart: - Group verbal and written instruction and models provide basic cardiac anatomy and physiology, with the coronary electrical and arterial systems. Review of Valvular disease and Heart Failure   Cardiac Procedures: - Group verbal and written  instruction to review commonly prescribed medications for heart disease. Reviews the medication, class of the drug, and side effects. Includes the steps to properly store meds and maintain the prescription regimen. (beta blockers and nitrates)   Cardiac Medications I: - Group verbal and written instruction to review commonly prescribed medications for heart disease. Reviews the medication, class of the drug, and side effects. Includes the steps to properly store meds and maintain the prescription regimen.   Cardiac Medications II: -Group verbal and written instruction to review commonly prescribed medications for heart disease. Reviews the medication, class of the drug, and side effects. (all other drug classes)    Go Sex-Intimacy & Heart Disease, Get SMART - Goal Setting: - Group verbal and written instruction through game format to discuss heart disease and the return to sexual intimacy. Provides group verbal and written material to discuss and apply goal setting through the application of the S.M.A.R.T. Method.   Other Matters of the Heart: - Provides group verbal, written materials and models to describe Stable Angina and Peripheral Artery. Includes description of the disease process and treatment options available to the cardiac patient.   Infection Prevention: - Provides verbal and written material to individual with discussion of infection control including proper hand washing and proper equipment cleaning during exercise session.   Cardiac Rehab from 04/28/2020 in [facility] Cardiac and Pulmonary Rehab  Date 04/28/20  Educator [facility]  Instruction Review Code 1- Verbalizes Understanding      Falls Prevention: - Provides verbal and written material to individual with discussion of falls prevention and safety.   Cardiac Rehab from 04/28/2020 in [facility] Cardiac and Pulmonary Rehab  Date 04/28/20  Educator [facility]  Instruction Review Code 1- Verbalizes Understanding      Other: -Provides group  and verbal instruction on various topics (see comments)   Knowledge Questionnaire Score:  Knowledge Questionnaire Score - 04/28/20 1002      Knowledge Questionnaire Score   Pre Score 20/26 Education Focus: Pulse, depression, PAD, angina, exercise, nutrition           Core Components/Risk Factors/Patient Goals at Admission:  Personal Goals and Risk Factors at Admission - 04/28/20 1003      Core Components/Risk Factors/Patient Goals on  Admission    Weight Management Yes;Weight Loss    Intervention Weight Management: Develop a combined nutrition and exercise program designed to reach desired caloric intake, while maintaining appropriate intake of nutrient and fiber, sodium and fats, and appropriate energy expenditure required for the weight goal.;Weight Management: Provide education and appropriate resources to help participant work on and attain dietary goals.    Admit Weight 213 lb 8 oz (96.8 kg)    Goal Weight: Short Term 208 lb (94.3 kg)    Goal Weight: Long Term 203 lb (92.1 kg)    Expected Outcomes Short Term: Continue to assess and modify interventions until short term weight is achieved;Long Term: Adherence to nutrition and physical activity/exercise program aimed toward attainment of established weight goal;Weight Loss: Understanding of general recommendations for a balanced deficit meal plan, which promotes 1-2 lb weight loss per week and includes a negative energy balance of [phone] kcal/d;Understanding recommendations for meals to include 15-35% energy as protein, 25-35% energy from fat, 35-60% energy from carbohydrates, less than '200mg'$  of dietary cholesterol, 20-35 gm of total fiber daily;Understanding of distribution of calorie intake throughout the day with the consumption of 4-5 meals/snacks    Diabetes Yes    Intervention Provide education about signs/symptoms and action to take for hypo/hyperglycemia.;Provide education about proper nutrition, including hydration, and  aerobic/resistive exercise prescription along with prescribed medications to achieve blood glucose in normal ranges: Fasting glucose 65-99 mg/dL    Expected Outcomes Short Term: Participant verbalizes understanding of the signs/symptoms and immediate care of hyper/hypoglycemia, proper foot care and importance of medication, aerobic/resistive exercise and nutrition plan for blood glucose control.;Long Term: Attainment of HbA1C < 7%.    Hypertension Yes    Intervention Provide education on lifestyle modifcations including regular physical activity/exercise, weight management, moderate sodium restriction and increased consumption of fresh fruit, vegetables, and low fat dairy, alcohol moderation, and smoking cessation.;Monitor prescription use compliance.    Expected Outcomes Short Term: Continued assessment and intervention until BP is < 140/47m HG in hypertensive participants. < 130/876mHG in hypertensive participants with diabetes, heart failure or chronic kidney disease.;Long Term: Maintenance of blood pressure at goal levels.    Lipids Yes    Intervention Provide education and support for participant on nutrition & aerobic/resistive exercise along with prescribed medications to achieve LDL '70mg'$ , HDL >'40mg'$ .    Expected Outcomes Short Term: Participant states understanding of desired cholesterol values and is compliant with medications prescribed. Participant is following exercise prescription and nutrition guidelines.;Long Term: Cholesterol controlled with medications as prescribed, with individualized exercise RX and with personalized nutrition plan. Value goals: LDL < '70mg'$ , HDL > 40 mg.           Education:Diabetes - Individual verbal and written instruction to review signs/symptoms of diabetes, desired ranges of glucose level fasting, after meals and with exercise. Acknowledge that pre and post exercise glucose checks will be done for 3 sessions at entry of program.   Education: Know Your  Numbers and Risk Factors: -Group verbal and written instruction about important numbers in your health.  Discussion of what are risk factors and how they play a role in the disease process.  Review of Cholesterol, Blood Pressure, Diabetes, and BMI and the role they play in your overall health.   Core Components/Risk Factors/Patient Goals Review:   Goals and Risk Factor Review    Row Name 05/21/20 1340 06/05/20 1344           Core Components/Risk Factors/Patient Goals Review   Personal  Goals Review Weight Management/Obesity;Hypertension;Diabetes;Lipids Weight Management/Obesity;Diabetes;Lipids;Hypertension      Review Jonni Sanger is doing well in rehab.  He has lost some weight already.  He is very good at keeping a close eye on the his numbers.  He weighs daily and also checks his blood pressures and blood sugars routinely throughout the day.  So far, his numbers are doing well and his blood sugars are at the lowest point in a few years! Andys sugar in the morning is 110-114. He is checking his blood sugar every day at home. His A1C has gone down since his last check.      Expected Outcomes Short: Continue to work on weight loss Long: Continue to monitor risk factors. Short: work on lowering fasting sugars with exercise and diet. Long: Maintain a controlled blood sugar.             Core Components/Risk Factors/Patient Goals at Discharge (Final Review):   Goals and Risk Factor Review - 06/05/20 1344      Core Components/Risk Factors/Patient Goals Review   Personal Goals Review Weight Management/Obesity;Diabetes;Lipids;Hypertension    Review Andys sugar in the morning is 110-114. He is checking his blood sugar every day at home. His A1C has gone down since his last check.    Expected Outcomes Short: work on lowering fasting sugars with exercise and diet. Long: Maintain a controlled blood sugar.           ITP Comments:  ITP Comments    Row Name 04/25/20 1315 04/28/20 0938 04/30/20 0610  04/30/20 1334 05/28/20 0616   ITP Comments Initial telephone encounter complete. Diagnosis can be found in [facility] 4/29. EP orientation scheduled for Monday 5/17 at 8:30 Completed 6MWT and gym orientation.  Initial ITP created and sent for review to Dr. Emily Filbert, Medical Director. 30 Day review completed. ITP review done, changes made as directed,and approval shown by signature of  Scientist, research (life sciences). First full day of exercise!  Patient was oriented to gym and equipment including functions, settings, policies, and procedures.  Patient's individual exercise prescription and treatment plan were reviewed.  All starting workloads were established based on the results of the 6 minute walk test done at initial orientation visit.  The plan for exercise progression was also introduced and progression will be customized based on patient's performance and goals. 30 Day review completed. Medical Director ITP review done, changes made as directed, and signed approval by Medical Director.   [place] Name 06/25/20 0639           ITP Comments 30 Day review completed. Medical Director ITP review done, changes made as directed, and signed approval by Medical Director.              Comments:

## 2020-06-25 NOTE — Progress Notes (Signed)
Daily Session Note  Patient Details  Name: SCHON ZEIDERS MRN: [id number] Date of Birth: June 06, 1941 Referring Provider:     Cardiac Rehab from 04/28/2020 in [facility] Cardiac and Pulmonary Rehab  Referring Provider Posey Boyer MD      Encounter Date: 06/25/2020  Check In:  Session Check In - 06/25/20 1411      Check-In   Supervising physician immediately available to respond to emergencies See telemetry face sheet for immediately available ER MD    Location [facility]-Cardiac & Pulmonary Rehab    Staff Present Renita Papa, RN BSN;Joseph Lou Miner, [place] Exercise Physiologist    Virtual Visit No    Medication changes reported     No    Fall or balance concerns reported    No    Warm-up and Cool-down Performed on first and last piece of equipment    Resistance Training Performed Yes    VAD Patient? No    PAD/SET Patient? No      Pain Assessment   Currently in Pain? No/denies              Social History   Tobacco Use  Smoking Status Former Smoker  . Packs/day: 2.00  . Years: 27.00  . Pack years: 54.00  . Types: Cigarettes  . Quit date: 12/21/1988  . Years since quitting: 31.5  Smokeless Tobacco Never Used    Goals Met:  Independence with exercise equipment Exercise tolerated well No report of cardiac concerns or symptoms Strength training completed today  Goals Unmet:  Not Applicable  Comments: Pt able to follow exercise prescription today without complaint.  Will continue to monitor for progression.    Dr. Emily Filbert is Medical Director for [place] and [facility].

## 2020-06-26 ENCOUNTER — Encounter: Payer: Medicare Other | Admitting: *Deleted

## 2020-06-26 ENCOUNTER — Other Ambulatory Visit: Payer: Self-pay

## 2020-06-26 DIAGNOSIS — I214 Non-ST elevation (NSTEMI) myocardial infarction: Secondary | ICD-10-CM | POA: Diagnosis not present

## 2020-06-26 DIAGNOSIS — Z955 Presence of coronary angioplasty implant and graft: Secondary | ICD-10-CM

## 2020-06-26 NOTE — Progress Notes (Signed)
Daily Session Note  Patient Details  Name: Daniel Luna MRN: [id number] Date of Birth: 1941-08-02 Referring Provider:     Cardiac Rehab from 04/28/2020 in [facility] Cardiac and Pulmonary Rehab  Referring Provider Posey Boyer MD      Encounter Date: 06/26/2020  Check In:  Session Check In - 06/26/20 1406      Check-In   Supervising physician immediately available to respond to emergencies See telemetry face sheet for immediately available ER MD    Location [facility]-Cardiac & Pulmonary Rehab    Staff Present Renita Papa, RN BSN;Joseph Hood RCP,RRT,BSRT;Laureen Lehigh, [place], RRT, CPFT    Virtual Visit No    Medication changes reported     No    Fall or balance concerns reported    No    Warm-up and Cool-down Performed on first and last piece of equipment    Resistance Training Performed Yes    VAD Patient? No    PAD/SET Patient? No      Pain Assessment   Currently in Pain? No/denies              Social History   Tobacco Use  Smoking Status Former Smoker  . Packs/day: 2.00  . Years: 27.00  . Pack years: 54.00  . Types: Cigarettes  . Quit date: 12/21/1988  . Years since quitting: 31.5  Smokeless Tobacco Never Used    Goals Met:  Independence with exercise equipment Exercise tolerated well No report of cardiac concerns or symptoms Strength training completed today  Goals Unmet:  Not Applicable  Comments: Pt able to follow exercise prescription today without complaint.  Will continue to monitor for progression.    Dr. Emily Filbert is Medical Director for [place] and [facility].

## 2020-06-27 NOTE — Unmapped (Signed)
Patient called and LM on nurse line stating he was returning a call.   Returned call and LM for him to call back.

## 2020-06-30 ENCOUNTER — Encounter: Payer: Medicare Other | Admitting: *Deleted

## 2020-06-30 ENCOUNTER — Other Ambulatory Visit: Payer: Self-pay

## 2020-06-30 DIAGNOSIS — I214 Non-ST elevation (NSTEMI) myocardial infarction: Secondary | ICD-10-CM | POA: Diagnosis not present

## 2020-06-30 DIAGNOSIS — Z955 Presence of coronary angioplasty implant and graft: Secondary | ICD-10-CM

## 2020-06-30 NOTE — Progress Notes (Signed)
Daily Session Note  Patient Details  Name: Daniel Luna MRN: [id number] Date of Birth: 1941/05/31 Referring Provider:     Cardiac Rehab from 04/28/2020 in [facility] Cardiac and Pulmonary Rehab  Referring Provider Posey Boyer MD      Encounter Date: 06/30/2020  Check In:  Session Check In - 06/30/20 1359      Check-In   Supervising physician immediately available to respond to emergencies See telemetry face sheet for immediately available ER MD    Location [facility]-Cardiac & Pulmonary Rehab    Staff Present Renita Papa, RN Margurite Auerbach, MS Exercise Physiologist;Kelly Amedeo Plenty, BS, ACSM CEP, Exercise Physiologist    Virtual Visit No    Medication changes reported     No    Fall or balance concerns reported    No    Warm-up and Cool-down Performed on first and last piece of equipment    Resistance Training Performed Yes    VAD Patient? No    PAD/SET Patient? No      Pain Assessment   Currently in Pain? No/denies              Social History   Tobacco Use  Smoking Status Former Smoker  . Packs/day: 2.00  . Years: 27.00  . Pack years: 54.00  . Types: Cigarettes  . Quit date: 12/21/1988  . Years since quitting: 31.5  Smokeless Tobacco Never Used    Goals Met:  Independence with exercise equipment Exercise tolerated well No report of cardiac concerns or symptoms Strength training completed today  Goals Unmet:  Not Applicable  Comments: Pt able to follow exercise prescription today without complaint.  Will continue to monitor for progression.    Dr. Emily Filbert is Medical Director for [place] and [facility].

## 2020-07-01 NOTE — Unmapped (Signed)
Mr. Douglas Beltran called again wanting to talk to Dr. 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  I left him a message that your office note said if Echo was still less than 35% would proceed with ICD Placement.    PLEASE CALL HIM TOMORROW.

## 2020-07-02 ENCOUNTER — Encounter: Payer: Medicare Other | Admitting: *Deleted

## 2020-07-02 ENCOUNTER — Other Ambulatory Visit: Payer: Self-pay

## 2020-07-02 DIAGNOSIS — I214 Non-ST elevation (NSTEMI) myocardial infarction: Secondary | ICD-10-CM | POA: Diagnosis not present

## 2020-07-02 DIAGNOSIS — Z955 Presence of coronary angioplasty implant and graft: Secondary | ICD-10-CM

## 2020-07-02 NOTE — Progress Notes (Signed)
Daily Session Note  Patient Details  Name: Daniel Luna MRN: [id number] Date of Birth: 10-Feb-1941 Referring Provider:     Cardiac Rehab from 04/28/2020 in [facility] Cardiac and Pulmonary Rehab  Referring Provider Posey Boyer MD      Encounter Date: 07/02/2020  Check In:  Session Check In - 07/02/20 1410      Check-In   Supervising physician immediately available to respond to emergencies See telemetry face sheet for immediately available ER MD    Location [facility]-Cardiac & Pulmonary Rehab    Staff Present Renita Papa, RN Margurite Auerbach, MS Exercise Physiologist;Amanda Oletta Darter, BA, ACSM CEP, Exercise Physiologist    Virtual Visit No    Medication changes reported     No    Fall or balance concerns reported    No    Warm-up and Cool-down Performed on first and last piece of equipment    Resistance Training Performed Yes    VAD Patient? No    PAD/SET Patient? No      Pain Assessment   Currently in Pain? No/denies              Social History   Tobacco Use  Smoking Status Former Smoker  . Packs/day: 2.00  . Years: 27.00  . Pack years: 54.00  . Types: Cigarettes  . Quit date: 12/21/1988  . Years since quitting: 31.5  Smokeless Tobacco Never Used    Goals Met:  Independence with exercise equipment Exercise tolerated well No report of cardiac concerns or symptoms Strength training completed today  Goals Unmet:  Not Applicable  Comments: Pt able to follow exercise prescription today without complaint.  Will continue to monitor for progression.    Dr. Emily Filbert is Medical Director for [place] and [facility].

## 2020-07-03 ENCOUNTER — Encounter: Payer: Medicare Other | Admitting: *Deleted

## 2020-07-03 ENCOUNTER — Other Ambulatory Visit: Payer: Self-pay

## 2020-07-03 DIAGNOSIS — Z955 Presence of coronary angioplasty implant and graft: Secondary | ICD-10-CM

## 2020-07-03 DIAGNOSIS — I214 Non-ST elevation (NSTEMI) myocardial infarction: Secondary | ICD-10-CM | POA: Diagnosis not present

## 2020-07-03 DIAGNOSIS — Z7189 Other specified counseling: Principal | ICD-10-CM

## 2020-07-03 NOTE — Progress Notes (Signed)
Daily Session Note  Patient Details  Name: Daniel Luna MRN: [id number] Date of Birth: 1941-10-13 Referring Provider:     Cardiac Rehab from 04/28/2020 in [facility] Cardiac and Pulmonary Rehab  Referring Provider Posey Boyer MD      Encounter Date: 07/03/2020  Check In:  Session Check In - 07/03/20 1356      Check-In   Supervising physician immediately available to respond to emergencies See telemetry face sheet for immediately available ER MD    Location [facility]-Cardiac & Pulmonary Rehab    Staff Present Renita Papa, RN BSN;Joseph Lou Miner, [place] Exercise Physiologist;Amanda Oletta Darter, [place], ACSM CEP, Exercise Physiologist    Virtual Visit No    Medication changes reported     No    Fall or balance concerns reported    No    Warm-up and Cool-down Performed on first and last piece of equipment    Resistance Training Performed Yes    VAD Patient? No    PAD/SET Patient? No      Pain Assessment   Currently in Pain? No/denies              Social History   Tobacco Use  Smoking Status Former Smoker  . Packs/day: 2.00  . Years: 27.00  . Pack years: 54.00  . Types: Cigarettes  . Quit date: 12/21/1988  . Years since quitting: 31.5  Smokeless Tobacco Never Used    Goals Met:  Independence with exercise equipment Exercise tolerated well No report of cardiac concerns or symptoms Strength training completed today  Goals Unmet:  Not Applicable  Comments: Pt able to follow exercise prescription today without complaint.  Will continue to monitor for progression.    Dr. Emily Filbert is Medical Director for [place] and [facility].

## 2020-07-07 ENCOUNTER — Other Ambulatory Visit: Payer: Self-pay

## 2020-07-07 ENCOUNTER — Encounter: Payer: Medicare Other | Admitting: *Deleted

## 2020-07-07 DIAGNOSIS — I214 Non-ST elevation (NSTEMI) myocardial infarction: Secondary | ICD-10-CM

## 2020-07-07 DIAGNOSIS — Z955 Presence of coronary angioplasty implant and graft: Secondary | ICD-10-CM

## 2020-07-07 NOTE — Progress Notes (Signed)
Daily Session Note  Patient Details  Name: Daniel Luna MRN: [id number] Date of Birth: 1941/04/25 Referring Provider:     Cardiac Rehab from 04/28/2020 in [facility] Cardiac and Pulmonary Rehab  Referring Provider Posey Boyer MD      Encounter Date: 07/07/2020  Check In:  Session Check In - 07/07/20 1359      Check-In   Supervising physician immediately available to respond to emergencies See telemetry face sheet for immediately available ER MD    Location [facility]-Cardiac & Pulmonary Rehab    Staff Present Renita Papa, RN BSN;Joseph [address] [place], [place], ACSM CEP, Exercise Physiologist;Amanda Oletta Darter, [place], ACSM CEP, Exercise Physiologist    Virtual Visit No    Medication changes reported     No    Fall or balance concerns reported    No    Warm-up and Cool-down Performed on first and last piece of equipment    Resistance Training Performed Yes    VAD Patient? No    PAD/SET Patient? No      Pain Assessment   Currently in Pain? No/denies              Social History   Tobacco Use  Smoking Status Former Smoker  . Packs/day: 2.00  . Years: 27.00  . Pack years: 54.00  . Types: Cigarettes  . Quit date: 12/21/1988  . Years since quitting: 31.5  Smokeless Tobacco Never Used    Goals Met:  Independence with exercise equipment Exercise tolerated well No report of cardiac concerns or symptoms Strength training completed today  Goals Unmet:  Not Applicable  Comments: Pt able to follow exercise prescription today without complaint.  Will continue to monitor for progression.    Dr. Emily Filbert is Medical Director for [place] and [facility].

## 2020-07-09 ENCOUNTER — Encounter: Payer: Medicare Other | Admitting: *Deleted

## 2020-07-09 ENCOUNTER — Other Ambulatory Visit: Payer: Self-pay

## 2020-07-09 DIAGNOSIS — Z955 Presence of coronary angioplasty implant and graft: Secondary | ICD-10-CM

## 2020-07-09 DIAGNOSIS — I214 Non-ST elevation (NSTEMI) myocardial infarction: Secondary | ICD-10-CM

## 2020-07-09 MED ORDER — EMPTY CONTAINER
2 refills | 0 days
Start: 2020-07-09 — End: ?

## 2020-07-09 NOTE — Unmapped (Signed)
[facility]   Patient Onboarding/Medication Counseling    Douglas Beltran is a 79 y.o. male with hyperlipidemia who I am counseling today on initiation of therapy.  I am speaking to the patient.    Was a Nurse, learning disability used for this call? No    Verified patient's date of birth / HIPAA.    Specialty medication(s) to be sent: General Specialty: Repatha      Non-specialty medications/supplies to be sent: Higher education careers adviser      Medications not needed at this time: n/a         Repatha (evolocumab)    Medication & Administration     Dosage: Inject the contents of 1 pen (140mg ) under the skin every 2 weeks.    The patient declined counseling on medication administration because patient states he has watched the administration video on the Repatha website several times and feels comfortable with the administration instructions.. The information in the declined sections below are for informational purposes only and was not discussed with patient.       Administration: Administer under the skin of the abdomen, thigh or upper arm. Rotate sites with each injection.  ??? Injection instructions - Autoinjector:  o Remove 1 Repatha autoinjector from the refrigerator and let stand at room temperature for at least 30 minutes.  o Check the autoinjector for the following:  - Expiration date  - Absence of any cracks or damage  - The medicine is clear and colorless and does not contain any particles  - The orange cap is present and securely attached  o Choose your injection site and clean with an alcohol wipe. Allow to air dry completely.  o Pull the orange cap straight off and discard  o Pinch the skin (or stretch) with your thumb and fingers creating an area 2 inches wide  o Maintaining the pinch (or stretch) press the pen to your skin at a 90 degree angle. Firmly push the autoinjector down until the skin stops moving and the yellow safety guard is no longer visible.  o Do not touch the gray start button yet  o When you are ready to inject, press the gray start button. You will hear a click that signals the start of the injection  o Continue to press the pen to your skin and lift your thumb  o The injection may take up to 15 seconds. You will know the injection is complete when the medication window turns yellow. You may also hear a second click.  o Remove the pen from your skin and discard the pen in a sharps container.  o If there is blood at the injection site, press a cotton ball or gauze to the site. Do not rub the injection site.    Adherence/Missed dose instructions: Administer a missed dose within 7 days and resume your normal schedule.  If it has been more than 7 days and you inject every 2 weeks, skip the missed dose and resume your normal schedule..     Goals of Therapy     Lower cholesterol, prevention of cardiovascular events in patients with established cardiovascular disease    Side Effects & Monitoring Parameters   ??? Flu-like symptoms  ??? Signs of a common cold  ??? Back pain  ??? Injection site irritation  ??? Nose or throat irritation    The following side effects should be reported to the provider:  ??? Signs of an allergic reaction  ??? Signs of high blood sugar (  confusion, drowsiness, increase thirst/hunger/urination, fast breathing, flushing)      Contraindications, Warnings, & Precautions     ??? Latex (the packaging of Repatha may contain natural rubber)    Drug/Food Interactions     ??? Medication list reviewed in Epic. The patient was instructed to inform the care team before taking any new medications or supplements. No drug interactions identified.     Storage, Handling Precautions, & Disposal   ??? Repatha should be stored in the refrigerator. If necessary, Repatha may be kept at room temperature for no more than 30 days.  ??? Place used devices in a sharps container for disposal.      Current Medications (including OTC/herbals), Comorbidities and Allergies     Current Outpatient Medications   Medication Sig Dispense Refill   ??? acetaminophen (TYLENOL) 500 MG tablet Take 500 mg by mouth every six (6) hours as needed for pain.     ??? aspirin (ECOTRIN) 81 MG tablet Take 81 mg by mouth daily.      ??? atorvastatin (LIPITOR) 80 MG tablet Take 1 tablet (80 mg total) by mouth daily. 90 tablet 3   ??? calcium polycarbophiL (FIBERCON) 625 mg tablet Take 1,250 mg by mouth nightly.     ??? carvediloL (COREG) 12.5 MG tablet Take 1 tablet (12.5 mg total) by mouth Two (2) times a day. 180 tablet 3   ??? clopidogreL (PLAVIX) 75 mg tablet Take 1 tablet (75 mg total) by mouth daily. 90 tablet 3   ??? empagliflozin (JARDIANCE) 10 mg tablet Take 1 tablet (10 mg total) by mouth daily. 30 tablet 11   ??? evolocumab 140 mg/mL PnIj Inject the contents of 1 pen (140 mg) under the skin every fourteen (14) days. 4 mL 11   ??? furosemide (LASIX) 20 MG tablet Take 1 tablet (20 mg total) by mouth once daily. 90 tablet 3   ??? metFORMIN (GLUCOPHAGE) 1000 MG tablet Take 1,000 mg by mouth.     ??? sacubitriL-valsartan (ENTRESTO) 24-26 mg tablet Take 0.5 tablets by mouth Two (2) times a day. 30 tablet 11   ??? sertraline (ZOLOFT) 50 MG tablet Take 50 mg by mouth daily.       No current facility-administered medications for this visit.       Allergies   Allergen Reactions   ??? Iodine Other (See Comments) and Hives       Patient Active Problem List   Diagnosis   ??? Coronary artery disease involving native coronary artery of native heart without angina pectoris   ??? Hypertension   ??? Mixed hyperlipidemia   ??? PVC (premature ventricular contraction)   ??? Ischemic cardiomyopathy   ??? Macrocytic anemia   ??? NSTEMI (non-ST elevated myocardial infarction) (CMS-HCC)   ??? History of total bilateral knee replacement   ??? Chronic systolic heart failure (CMS-HCC)   ??? Type 2 diabetes mellitus without complication (CMS-HCC)       Reviewed and up to date in Epic.    Appropriateness of Therapy     Is medication and dose appropriate based on diagnosis? Yes    Prescription has been clinically reviewed: Yes Baseline Quality of Life Assessment      How many days over the past month did your high cholesterol  keep you from your normal activities? For example, brushing your teeth or getting up in the morning. Patient declined to answer    Financial Information     Medication Assistance provided: [facility] Assistance    Anticipated copay of $  0 reviewed with patient. Verified delivery address.    Delivery Information     Scheduled delivery date: 07/10/20    Expected start date: 07/10/20    Medication will be delivered via Same Day Courier to the prescription address in [facility].  This shipment will not require a signature.      Explained the services we provide at [facility] and that each month we would call to set up refills.  Stressed importance of returning phone calls so that we could ensure they receive their medications in time each month.  Informed patient that we should be setting up refills 7-10 days prior to when they will run out of medication.  A pharmacist will reach out to perform a clinical assessment periodically.  Informed patient that a welcome packet and a drug information handout will be sent.      Patient verbalized understanding of the above information as well as how to contact the pharmacy at [phone] option 4 with any questions/concerns.  The pharmacy is open Monday through Friday 8:30am-4:30pm.  A pharmacist is available 24/7 via pager to answer any clinical questions they may have.    Patient Specific Needs     - Does the patient have any physical, cognitive, or cultural barriers? No    - Patient prefers to have medications discussed with  Patient     - Is the patient or caregiver able to read and understand education materials at a high school level or above? Yes    - Patient's primary language is  English     - Is the patient high risk? No     - Does the patient require a Care Management Plan? No     - Does the patient require physician intervention or other additional services (i.e. nutrition, smoking cessation, social work)? No      [name]  [facility] Specialty Pharmacist

## 2020-07-09 NOTE — Unmapped (Signed)
[facility] Specialty Medication Onboarding    Specialty Medication: Repatha Sureclick pens  Prior Authorization: Not Required   Financial Assistance: Yes - grant approved as secondary   Final Copay/Day Supply: $0 / 56 days    Insurance Restrictions: None     Notes to Pharmacist: Patient now approved for grant to cover copays.     The triage team has completed the benefits investigation and has determined that the patient is able to fill this medication at [facility]. Please contact the patient to complete the onboarding or follow up with the prescribing physician as needed.

## 2020-07-09 NOTE — Progress Notes (Signed)
Daily Session Note  Patient Details  Name: KASEN ADDUCI MRN: [id number] Date of Birth: 1941-11-24 Referring Provider:     Cardiac Rehab from 04/28/2020 in [facility] Cardiac and Pulmonary Rehab  Referring Provider Posey Boyer MD      Encounter Date: 07/09/2020  Check In:  Session Check In - 07/09/20 1356      Check-In   Supervising physician immediately available to respond to emergencies See telemetry face sheet for immediately available ER MD    Location [facility]-Cardiac & Pulmonary Rehab    Staff Present Renita Papa, RN BSN;Joseph Hood RCP,RRT,BSRT;Laureen Owens Shark, BS, RRT, CPFT;Jessica [place], [place], RCEP, CCRP, CCET    Virtual Visit No    Medication changes reported     No    Fall or balance concerns reported    No    Warm-up and Cool-down Performed on first and last piece of equipment    Resistance Training Performed Yes    VAD Patient? No    PAD/SET Patient? No      Pain Assessment   Currently in Pain? No/denies              Social History   Tobacco Use  Smoking Status Former Smoker  . Packs/day: 2.00  . Years: 27.00  . Pack years: 54.00  . Types: Cigarettes  . Quit date: 12/21/1988  . Years since quitting: 31.5  Smokeless Tobacco Never Used    Goals Met:  Independence with exercise equipment Exercise tolerated well No report of cardiac concerns or symptoms Strength training completed today  Goals Unmet:  Not Applicable  Comments: Pt able to follow exercise prescription today without complaint.  Will continue to monitor for progression.    Dr. Emily Filbert is Medical Director for [place] and [facility].

## 2020-07-10 ENCOUNTER — Encounter: Payer: Medicare Other | Admitting: *Deleted

## 2020-07-10 ENCOUNTER — Other Ambulatory Visit: Payer: Self-pay

## 2020-07-10 DIAGNOSIS — I214 Non-ST elevation (NSTEMI) myocardial infarction: Secondary | ICD-10-CM

## 2020-07-10 DIAGNOSIS — Z955 Presence of coronary angioplasty implant and graft: Secondary | ICD-10-CM

## 2020-07-10 MED FILL — REPATHA SURECLICK 140 MG/ML SUBCUTANEOUS PEN INJECTOR: 56 days supply | Qty: 4 | Fill #0 | Status: AC

## 2020-07-10 MED FILL — EMPTY CONTAINER: 120 days supply | Qty: 1 | Fill #0

## 2020-07-10 MED FILL — EMPTY CONTAINER: 120 days supply | Qty: 1 | Fill #0 | Status: AC

## 2020-07-10 NOTE — Progress Notes (Signed)
Daily Session Note  Patient Details  Name: Daniel Luna MRN: [id number] Date of Birth: 03/15/1941 Referring Provider:     Cardiac Rehab from 04/28/2020 in [facility] Cardiac and Pulmonary Rehab  Referring Provider Cavender, Matthew MD      Encounter Date: 07/10/2020  Check In:  Session Check In - 07/10/20 1354      Check-In   Supervising physician immediately available to respond to emergencies See telemetry face sheet for immediately available ER MD    Location [facility]-Cardiac & Pulmonary Rehab    Staff Present Meredith Craven, RN BSN;Joseph Hood RCP,RRT,BSRT;Jessica Hawkins, MA, RCEP, CCRP, CCET    Virtual Visit No    Medication changes reported     No    Fall or balance concerns reported    No    Warm-up and Cool-down Performed on first and last piece of equipment    Resistance Training Performed Yes    VAD Patient? No    PAD/SET Patient? No      Pain Assessment   Currently in Pain? No/denies              Social History   Tobacco Use  Smoking Status Former Smoker  . Packs/day: 2.00  . Years: 27.00  . Pack years: 54.00  . Types: Cigarettes  . Quit date: 12/21/1988  . Years since quitting: 31.5  Smokeless Tobacco Never Used    Goals Met:  Independence with exercise equipment Exercise tolerated well No report of cardiac concerns or symptoms Strength training completed today  Goals Unmet:  Not Applicable  Comments: Pt able to follow exercise prescription today without complaint.  Will continue to monitor for progression.    Dr. Mark Miller is Medical Director for [facility] and [facility]. 

## 2020-07-11 NOTE — Unmapped (Addendum)
[facility] received fax from [employer] requesting status of the patient LVAD evaluation and updates. Per VAD Coordinator(Ashley) the patient is ineligible at this time and decided to explore options.    Sent update back to [employer]

## 2020-07-14 ENCOUNTER — Other Ambulatory Visit: Payer: Self-pay

## 2020-07-14 ENCOUNTER — Encounter: Payer: Medicare Other | Attending: Internal Medicine | Admitting: *Deleted

## 2020-07-14 DIAGNOSIS — Z955 Presence of coronary angioplasty implant and graft: Secondary | ICD-10-CM | POA: Diagnosis not present

## 2020-07-14 DIAGNOSIS — I214 Non-ST elevation (NSTEMI) myocardial infarction: Secondary | ICD-10-CM | POA: Diagnosis present

## 2020-07-14 NOTE — Progress Notes (Signed)
Daily Session Note  Patient Details  Name: VESTER BALTHAZOR MRN: [id number] Date of Birth: May 18, 1941 Referring Provider:     Cardiac Rehab from 04/28/2020 in [facility] Cardiac and Pulmonary Rehab  Referring Provider Posey Boyer MD      Encounter Date: 07/14/2020  Check In:  Session Check In - 07/14/20 1357      Check-In   Supervising physician immediately available to respond to emergencies See telemetry face sheet for immediately available ER MD    Location [facility]-Cardiac & Pulmonary Rehab    Staff Present Renita Papa, RN Margurite Auerbach, MS Exercise Physiologist;Kelly Amedeo Plenty, BS, ACSM CEP, Exercise Physiologist    Virtual Visit No    Medication changes reported     No    Fall or balance concerns reported    No    Warm-up and Cool-down Performed on first and last piece of equipment    Resistance Training Performed Yes    VAD Patient? No    PAD/SET Patient? No      Pain Assessment   Currently in Pain? No/denies              Social History   Tobacco Use  Smoking Status Former Smoker  . Packs/day: 2.00  . Years: 27.00  . Pack years: 54.00  . Types: Cigarettes  . Quit date: 12/21/1988  . Years since quitting: 31.5  Smokeless Tobacco Never Used    Goals Met:  Independence with exercise equipment Exercise tolerated well No report of cardiac concerns or symptoms Strength training completed today  Goals Unmet:  Not Applicable  Comments: Pt able to follow exercise prescription today without complaint.  Will continue to monitor for progression.    Dr. Emily Filbert is Medical Director for [place] and [facility].

## 2020-07-16 ENCOUNTER — Encounter: Payer: Medicare Other | Admitting: *Deleted

## 2020-07-16 ENCOUNTER — Other Ambulatory Visit: Payer: Self-pay

## 2020-07-16 DIAGNOSIS — I214 Non-ST elevation (NSTEMI) myocardial infarction: Secondary | ICD-10-CM

## 2020-07-16 DIAGNOSIS — Z955 Presence of coronary angioplasty implant and graft: Secondary | ICD-10-CM

## 2020-07-16 NOTE — Progress Notes (Signed)
Daily Session Note  Patient Details  Name: Daniel Luna MRN: [id number] Date of Birth: Nov 02, 1941 Referring Provider:     Cardiac Rehab from 04/28/2020 in [facility] Cardiac and Pulmonary Rehab  Referring Provider Posey Boyer MD      Encounter Date: 07/16/2020  Check In:  Session Check In - 07/16/20 1418      Check-In   Supervising physician immediately available to respond to emergencies See telemetry face sheet for immediately available ER MD    Location [facility]-Cardiac & Pulmonary Rehab    Staff Present Renita Papa, RN BSN;Joseph Lou Miner, [place] Exercise Physiologist;Susanne Bice, RN, BSN, CCRP    Virtual Visit No    Medication changes reported     No    Fall or balance concerns reported    No    Warm-up and Cool-down Performed on first and last piece of equipment    Resistance Training Performed Yes    VAD Patient? No    PAD/SET Patient? No      Pain Assessment   Currently in Pain? No/denies              Social History   Tobacco Use  Smoking Status Former Smoker  . Packs/day: 2.00  . Years: 27.00  . Pack years: 54.00  . Types: Cigarettes  . Quit date: 12/21/1988  . Years since quitting: 31.5  Smokeless Tobacco Never Used    Goals Met:  Independence with exercise equipment Exercise tolerated well No report of cardiac concerns or symptoms Strength training completed today  Goals Unmet:  Not Applicable  Comments: Pt able to follow exercise prescription today without complaint.  Will continue to monitor for progression.    Dr. Emily Filbert is Medical Director for [place] and [facility].

## 2020-07-17 ENCOUNTER — Encounter: Payer: Medicare Other | Admitting: *Deleted

## 2020-07-17 ENCOUNTER — Other Ambulatory Visit: Payer: Self-pay

## 2020-07-17 DIAGNOSIS — I214 Non-ST elevation (NSTEMI) myocardial infarction: Secondary | ICD-10-CM | POA: Diagnosis not present

## 2020-07-17 DIAGNOSIS — Z955 Presence of coronary angioplasty implant and graft: Secondary | ICD-10-CM

## 2020-07-17 NOTE — Patient Instructions (Signed)
Discharge Patient Instructions  Patient Details  Name: Daniel Luna MRN: [id number] Date of Birth: 09-09-1941 Referring Provider:  Adella Nissen*   Number of Visits: 36  Reason for Discharge:  Patient reached a stable level of exercise. Patient independent in their exercise. Patient has met program and personal goals.  Smoking History:  Social History   Tobacco Use  Smoking Status Former Smoker  . Packs/day: 2.00  . Years: 27.00  . Pack years: 54.00  . Types: Cigarettes  . Quit date: 12/21/1988  . Years since quitting: 31.5  Smokeless Tobacco Never Used    Diagnosis:  NSTEMI (non-ST elevated myocardial infarction) ([place])  Status post coronary artery stent placement  Initial Exercise Prescription:  Initial Exercise Prescription - 04/28/20 0900      Date of Initial Exercise RX and Referring Provider   Date 04/28/20    Referring Provider Posey Boyer MD      Treadmill   MPH 2.5    Grade 0.5    Minutes 15    METs 3.09      Recumbant Bike   Level 3    RPM 50    Watts 32    Minutes 15    METs 3      NuStep   Level 3    SPM 80    Minutes 15    METs 3      Recumbant Elliptical   Level 1    RPM 50    Minutes 15    METs 3      Prescription Details   Frequency (times per week) 3    Duration Progress to 30 minutes of continuous aerobic without signs/symptoms of physical distress      Intensity   THRR 40-80% of Max Heartrate 104-131    Ratings of Perceived Exertion 11-13    Perceived Dyspnea 0-4      Progression   Progression Continue to progress workloads to maintain intensity without signs/symptoms of physical distress.      Resistance Training   Training Prescription Yes    Weight 3 lb    Reps 10-15           Discharge Exercise Prescription (Final Exercise Prescription Changes):  Exercise Prescription Changes - 07/15/20 1200      Response to Exercise   Blood Pressure (Admit) 124/60    Blood Pressure (Exercise) 128/64     Blood Pressure (Exit) 122/64    Heart Rate (Admit) 64 bpm    Heart Rate (Exercise) 105 bpm    Heart Rate (Exit) 90 bpm    Rating of Perceived Exertion (Exercise) 15    Symptoms none    Duration Continue with 30 min of aerobic exercise without signs/symptoms of physical distress.    Intensity THRR unchanged      Progression   Progression Continue to progress workloads to maintain intensity without signs/symptoms of physical distress.    Average METs 3.93      Resistance Training   Training Prescription Yes    Weight 5 lb    Reps 10-15      Interval Training   Interval Training No      Treadmill   MPH 3.3    Grade 2.5    Minutes 15    METs 4.66      Recumbant Elliptical   Level 4.5    RPM 50    Minutes 15    METs 3.2  Functional Capacity:  6 Minute Walk    Row Name 04/28/20 0943 07/07/20 1418       6 Minute Walk   Phase Initial Discharge    Distance 1445 feet 1740 feet    Distance % Change -- 20.4 %    Distance Feet Change -- 295 ft    Walk Time 6 minutes 6 minutes    # of Rest Breaks 0 0    MPH 2.74 3.3    METS 3.04 3.56    RPE 10 14    VO2 Peak 10.64 12.48    Symptoms Yes (comment) No    Comments heavy gait on left side --    Resting HR 78 bpm 69 bpm    Resting BP 128/74 88/56    Resting Oxygen Saturation  99 % --    Exercise Oxygen Saturation  during 6 min walk 95 % --    Max Ex. HR 95 bpm 103 bpm    Max Ex. BP 148/72 140/64    2 Minute Post BP 134/62 --           Quality of Life:  Quality of Life - 07/14/20 1429      Quality of Life Scores   Health/Function Pre 15.96 %    Health/Function Post 23.77 %    Health/Function % Change 48.93 %    Socioeconomic Pre 23.75 %    Socioeconomic Post 24.29 %    Socioeconomic % Change  2.27 %    Psych/Spiritual Pre 26.14 %    Psych/Spiritual Post 27.07 %    Psych/Spiritual % Change 3.56 %    Family Pre 28.8 %    Family Post 30 %    Family % Change 4.17 %    GLOBAL Pre 22.9 %    GLOBAL  Post 25.47 %    GLOBAL % Change 11.22 %           Personal Goals: Goals established at orientation with interventions provided to work toward goal.  Personal Goals and Risk Factors at Admission - 04/28/20 1003      Core Components/Risk Factors/Patient Goals on Admission    Weight Management Yes;Weight Loss    Intervention Weight Management: Develop a combined nutrition and exercise program designed to reach desired caloric intake, while maintaining appropriate intake of nutrient and fiber, sodium and fats, and appropriate energy expenditure required for the weight goal.;Weight Management: Provide education and appropriate resources to help participant work on and attain dietary goals.    Admit Weight 213 lb 8 oz (96.8 kg)    Goal Weight: Short Term 208 lb (94.3 kg)    Goal Weight: Long Term 203 lb (92.1 kg)    Expected Outcomes Short Term: Continue to assess and modify interventions until short term weight is achieved;Long Term: Adherence to nutrition and physical activity/exercise program aimed toward attainment of established weight goal;Weight Loss: Understanding of general recommendations for a balanced deficit meal plan, which promotes 1-2 lb weight loss per week and includes a negative energy balance of [phone] kcal/d;Understanding recommendations for meals to include 15-35% energy as protein, 25-35% energy from fat, 35-60% energy from carbohydrates, less than '200mg'$  of dietary cholesterol, 20-35 gm of total fiber daily;Understanding of distribution of calorie intake throughout the day with the consumption of 4-5 meals/snacks    Diabetes Yes    Intervention Provide education about signs/symptoms and action to take for hypo/hyperglycemia.;Provide education about proper nutrition, including hydration, and aerobic/resistive exercise prescription along with prescribed medications to  achieve blood glucose in normal ranges: Fasting glucose 65-99 mg/dL    Expected Outcomes Short Term: Participant  verbalizes understanding of the signs/symptoms and immediate care of hyper/hypoglycemia, proper foot care and importance of medication, aerobic/resistive exercise and nutrition plan for blood glucose control.;Long Term: Attainment of HbA1C < 7%.    Hypertension Yes    Intervention Provide education on lifestyle modifcations including regular physical activity/exercise, weight management, moderate sodium restriction and increased consumption of fresh fruit, vegetables, and low fat dairy, alcohol moderation, and smoking cessation.;Monitor prescription use compliance.    Expected Outcomes Short Term: Continued assessment and intervention until BP is < 140/85m HG in hypertensive participants. < 130/834mHG in hypertensive participants with diabetes, heart failure or chronic kidney disease.;Long Term: Maintenance of blood pressure at goal levels.    Lipids Yes    Intervention Provide education and support for participant on nutrition & aerobic/resistive exercise along with prescribed medications to achieve LDL '70mg'$ , HDL >'40mg'$ .    Expected Outcomes Short Term: Participant states understanding of desired cholesterol values and is compliant with medications prescribed. Participant is following exercise prescription and nutrition guidelines.;Long Term: Cholesterol controlled with medications as prescribed, with individualized exercise RX and with personalized nutrition plan. Value goals: LDL < '70mg'$ , HDL > 40 mg.            Personal Goals Discharge:  Goals and Risk Factor Review - 07/07/20 1447      Core Components/Risk Factors/Patient Goals Review   Personal Goals Review Weight Management/Obesity;Diabetes;Lipids;Hypertension    Review AnJonni Sangeras done well in rehab.  He is into the routine now of checking his pressures and blood sugars.  He plans to continue to keep a close eye on these going forward.    Expected Outcomes Continue to work on weight loss and monitoring risk factors.           Exercise  Goals and Review:  Exercise Goals    Row Name 04/28/20 0948             Exercise Goals   Increase Physical Activity Yes       Intervention Provide advice, education, support and counseling about physical activity/exercise needs.;Develop an individualized exercise prescription for aerobic and resistive training based on initial evaluation findings, risk stratification, comorbidities and participant's personal goals.       Expected Outcomes Short Term: Attend rehab on a regular basis to increase amount of physical activity.;Long Term: Add in home exercise to make exercise part of routine and to increase amount of physical activity.;Long Term: Exercising regularly at least 3-5 days a week.       Increase Strength and Stamina Yes       Intervention Provide advice, education, support and counseling about physical activity/exercise needs.;Develop an individualized exercise prescription for aerobic and resistive training based on initial evaluation findings, risk stratification, comorbidities and participant's personal goals.       Expected Outcomes Short Term: Increase workloads from initial exercise prescription for resistance, speed, and METs.;Long Term: Improve cardiorespiratory fitness, muscular endurance and strength as measured by increased METs and functional capacity (6MWT);Short Term: Perform resistance training exercises routinely during rehab and add in resistance training at home       Able to understand and use rate of perceived exertion (RPE) scale Yes       Intervention Provide education and explanation on how to use RPE scale       Expected Outcomes Short Term: Able to use RPE daily in rehab to express subjective  intensity level;Long Term:  Able to use RPE to guide intensity level when exercising independently       Able to understand and use Dyspnea scale Yes       Intervention Provide education and explanation on how to use Dyspnea scale       Expected Outcomes Short Term: Able to use  Dyspnea scale daily in rehab to express subjective sense of shortness of breath during exertion;Long Term: Able to use Dyspnea scale to guide intensity level when exercising independently       Knowledge and understanding of Target Heart Rate Range (THRR) Yes       Intervention Provide education and explanation of THRR including how the numbers were predicted and where they are located for reference       Expected Outcomes Short Term: Able to state/look up THRR;Short Term: Able to use daily as guideline for intensity in rehab;Long Term: Able to use THRR to govern intensity when exercising independently       Able to check pulse independently Yes       Intervention Provide education and demonstration on how to check pulse in carotid and radial arteries.;Review the importance of being able to check your own pulse for safety during independent exercise       Expected Outcomes Short Term: Able to explain why pulse checking is important during independent exercise;Long Term: Able to check pulse independently and accurately       Understanding of Exercise Prescription Yes       Intervention Provide education, explanation, and written materials on patient's individual exercise prescription       Expected Outcomes Short Term: Able to explain program exercise prescription;Long Term: Able to explain home exercise prescription to exercise independently              Exercise Goals Re-Evaluation:  Exercise Goals Re-Evaluation    Row Name 04/30/20 1335 05/06/20 1501 05/21/20 1336 05/29/20 1400 06/02/20 1436     Exercise Goal Re-Evaluation   Exercise Goals Review Increase Physical Activity;Able to understand and use rate of perceived exertion (RPE) scale;Knowledge and understanding of Target Heart Rate Range (THRR);Understanding of Exercise Prescription;Increase Strength and Stamina;Able to check pulse independently Increase Physical Activity;Increase Strength and Stamina;Understanding of Exercise Prescription  Increase Physical Activity;Increase Strength and Stamina;Understanding of Exercise Prescription Increase Physical Activity;Increase Strength and Stamina;Understanding of Exercise Prescription;Able to understand and use rate of perceived exertion (RPE) scale;Able to understand and use Dyspnea scale;Knowledge and understanding of Target Heart Rate Range (THRR);Able to check pulse independently Increase Physical Activity;Increase Strength and Stamina;Understanding of Exercise Prescription   Comments Reviewed RPE and dyspnea scales, THR and program prescription with pt today.  Pt voiced understanding and was given a copy of goals to take home. Jonni Sanger is off to a good start in rehab.  He has completed his first 3 full days of exericse.  He has had some issues with his sugar dropping after exercise and we will continue to monitor it. Jonni Sanger is doing well in rehab.  He is already starting to feel better and has more energy.  We will review his home exercise guidelines soon. Reviewed home exercise with pt today.  Pt plans to walk and use staff videos for exercise.  He also belongs to the [facility] but is thinking about joining the [employer].  Reviewed THR, pulse, RPE, sign and symptoms, pulse oximetery and when to call 911 or MD.  Also discussed weather considerations and indoor options.  Pt voiced understanding. Jonni Sanger is doing  well in rehab.  He is up to 4.2 METs on the bike.  We will continue to monitor his progress.   Expected Outcomes Short: Use RPE daily to regulate intensity. Long: Follow program prescription in THR. Short: Continue to attend regularly Long: Continue to follow program prescription Short: Review home exercise guidelines Long: Continue to improve stamina. Short: Add in more exercise at home and decide on [employer] vs [employer].  Long: Continue to improve stamina. Short: Increase recumbent elliptical  Long: Conitnue to increase exercise at home.   Sabin Name 06/18/20 1421 06/30/20 1327 07/07/20 1446 07/15/20 1245        Exercise Goal Re-Evaluation   Exercise Goals Review Increase Physical Activity;Increase Strength and Stamina;Understanding of Exercise Prescription Increase Physical Activity;Increase Strength and Stamina;Understanding of Exercise Prescription Increase Physical Activity;Increase Strength and Stamina;Understanding of Exercise Prescription Increase Physical Activity;Increase Strength and Stamina;Understanding of Exercise Prescription    Comments Jonni Sanger attends consistently and works in correct THR range.  Staff will encourage increasing weight for strength work.  He has increased workloads on TM and REL. Jonni Sanger is doing well in rehab.  He is now attemtping the elliptical and last 3 min his first day on there.  We will continue to monitor his progress. Jonni Sanger improved his post 6MWT by 295 ft!!  He will be graduating next week.  He plans to continue to walk at the mall daily and is going to return to the [facility] with his [employer]neakers.  He is feeling better overall. --    Expected Outcomes Short: increase to 5 lb for strength Long:  continue to improve MET level Short: Continue to build up stamina on the elliptical  Long: Continue to improve stamina. Graduate and continue to exercise independently --           Nutrition & Weight - Outcomes:  Pre Biometrics - 04/28/20 1000      Pre Biometrics   Height 6' 2.5" (1.892 m)    Weight 213 lb 8 oz (96.8 kg)    BMI (Calculated) 27.05    Single Leg Stand 1.81 seconds            Nutrition:  Nutrition Therapy & Goals - 05/26/20 1431      Nutrition Therapy   Diet heart heathly, low Na, diabetes friendly    Protein (specify units) 75-80g    Fiber 30 grams    Whole Grain Foods 3 servings    Saturated Fats 12 max. grams    Fruits and Vegetables 5 servings/day    Sodium 1.5 grams      Personal Nutrition Goals   Nutrition Goal LT: get back to activites and wlaking 6000 steps.    Comments 64oz. 6.8 a1c, now BG 120-130. coffee with splenda and nonfat  creamer. Cereal -nonfat milk, honey bunches of oats with almonds and banana and will have it with strawberries. Weekends will have waffles with some syrup or [place] sausage and eggs with toast. L: sandwich with low sodium ham or chicken with low fat mayo or mustard with carrot sticks with piece of fruit and diet soda. D: wife will cook dinner. protein vegetable and starch- uses pam or canola oil spray and sometimes olive oil and unsalted butter mixture. Lots of salad - olive oil and vinegar. will drink water for the rest of the day. usually will drink water. S: nuts, chocolate pudding, granola bar. uses whole wheat bread or rye or oatmeal bread.      Intervention Plan   Intervention  Prescribe, educate and counsel regarding individualized specific dietary modifications aiming towards targeted core components such as weight, hypertension, lipid management, diabetes, heart failure and other comorbidities.;Nutrition handout(s) given to patient.    Expected Outcomes Short Term Goal: Understand basic principles of dietary content, such as calories, fat, sodium, cholesterol and nutrients.;Short Term Goal: A plan has been developed with personal nutrition goals set during dietitian appointment.;Long Term Goal: Adherence to prescribed nutrition plan.           Nutrition Discharge:  Nutrition Assessments - 07/14/20 1428      MEDFICTS Scores   Post Score 0           Education Questionnaire Score:  Knowledge Questionnaire Score - 07/14/20 1428      Knowledge Questionnaire Score   Post Score 26/26           Goals reviewed with patient; copy given to patient.

## 2020-07-17 NOTE — Progress Notes (Signed)
Discharge Progress Report  Patient Details  Name: Daniel Luna MRN: [id number] Date of Birth: 19-Feb-1941 Referring Provider:     Cardiac Rehab from 04/28/2020 in [facility]  Referring Provider Posey Boyer MD       Number of Visits: 36  Reason for Discharge:  Patient reached a stable level of exercise. Patient independent in their exercise. Patient has met program and personal goals.  Smoking History:  Social History   Tobacco Use  Smoking Status Former Smoker   Packs/day: 2.00   Years: 27.00   Pack years: 54.00   Types: Cigarettes   Quit date: 12/21/1988   Years since quitting: 31.5  Smokeless Tobacco Never Used    Diagnosis:  NSTEMI (non-ST elevated myocardial infarction) ([place])  Status post coronary artery stent placement  ADL [facility]:   Initial Exercise Prescription:  Initial Exercise Prescription - 04/28/20 0900      Date of Initial Exercise RX and Referring Provider   Date 04/28/20    Referring Provider Posey Boyer MD      Treadmill   MPH 2.5    Grade 0.5    Minutes 15    METs 3.09      Recumbant Bike   Level 3    RPM 50    Watts 32    Minutes 15    METs 3      NuStep   Level 3    SPM 80    Minutes 15    METs 3      Recumbant Elliptical   Level 1    RPM 50    Minutes 15    METs 3      Prescription Details   Frequency (times per week) 3    Duration Progress to 30 minutes of continuous aerobic without signs/symptoms of physical distress      Intensity   THRR 40-80% of Max Heartrate 104-131    Ratings of Perceived Exertion 11-13    Perceived Dyspnea 0-4      Progression   Progression Continue to progress workloads to maintain intensity without signs/symptoms of physical distress.      Resistance Training   Training Prescription Yes    Weight 3 lb    Reps 10-15           Discharge Exercise Prescription (Final Exercise Prescription Changes):  Exercise Prescription Changes - 07/15/20 1200       Response to Exercise   Blood Pressure (Admit) 124/60    Blood Pressure (Exercise) 128/64    Blood Pressure (Exit) 122/64    Heart Rate (Admit) 64 bpm    Heart Rate (Exercise) 105 bpm    Heart Rate (Exit) 90 bpm    Rating of Perceived Exertion (Exercise) 15    Symptoms none    Duration Continue with 30 min of aerobic exercise without signs/symptoms of physical distress.    Intensity THRR unchanged      Progression   Progression Continue to progress workloads to maintain intensity without signs/symptoms of physical distress.    Average METs 3.93      Resistance Training   Training Prescription Yes    Weight 5 lb    Reps 10-15      Interval Training   Interval Training No      Treadmill   MPH 3.3    Grade 2.5    Minutes 15    METs 4.66      Recumbant Elliptical   Level 4.5  RPM 50    Minutes 15    METs 3.2           Functional Capacity:  6 Minute Walk    Row Name 04/28/20 0943 07/07/20 1418       6 Minute Walk   Phase Initial Discharge    Distance 1445 feet 1740 feet    Distance % Change -- 20.4 %    Distance Feet Change -- 295 ft    Walk Time 6 minutes 6 minutes    # of Rest Breaks 0 0    MPH 2.74 3.3    METS 3.04 3.56    RPE 10 14    VO2 Peak 10.64 12.48    Symptoms Yes (comment) No    Comments heavy gait on left side --    Resting HR 78 bpm 69 bpm    Resting BP 128/74 88/56    Resting Oxygen Saturation  99 % --    Exercise Oxygen Saturation  during 6 min walk 95 % --    Max Ex. HR 95 bpm 103 bpm    Max Ex. BP 148/72 140/64    2 Minute Post BP 134/62 --           Psychological, QOL, Others - Outcomes: PHQ 2/9: Depression screen [facility] 2/9 07/14/2020 04/28/2020  Decreased Interest 0 0  Down, Depressed, Hopeless 0 0  PHQ - 2 Score 0 0  Altered sleeping 0 1  Tired, decreased energy 1 1  Change in appetite 0 0  Feeling bad or failure about yourself  0 0  Trouble concentrating 0 0  Moving slowly or fidgety/restless 0 0  Suicidal thoughts 0 0   PHQ-9 Score 1 2  Difficult doing work/chores Not difficult at all Not difficult at all    Quality of Life:  Quality of Life - 07/14/20 1429      Quality of Life Scores   Health/Function Pre 15.96 %    Health/Function Post 23.77 %    Health/Function % Change 48.93 %    Socioeconomic Pre 23.75 %    Socioeconomic Post 24.29 %    Socioeconomic % Change  2.27 %    Psych/Spiritual Pre 26.14 %    Psych/Spiritual Post 27.07 %    Psych/Spiritual % Change 3.56 %    Family Pre 28.8 %    Family Post 30 %    Family % Change 4.17 %    GLOBAL Pre 22.9 %    GLOBAL Post 25.47 %    GLOBAL % Change 11.22 %           Nutrition & Weight - Outcomes:  Pre Biometrics - 04/28/20 1000      Pre Biometrics   Height 6' 2.5" (1.892 m)    Weight 213 lb 8 oz (96.8 kg)    BMI (Calculated) 27.05    Single Leg Stand 1.81 seconds            Nutrition:  Nutrition Therapy & Goals - 05/26/20 1431      Nutrition Therapy   Diet heart heathly, low Na, diabetes friendly    Protein (specify units) 75-80g    Fiber 30 grams    Whole Grain Foods 3 servings    Saturated Fats 12 max. grams    Fruits and Vegetables 5 servings/day    Sodium 1.5 grams      Personal Nutrition Goals   Nutrition Goal LT: get back to activites and wlaking 6000 steps.  Comments 64oz. 6.8 a1c, now BG 120-130. coffee with splenda and nonfat creamer. Cereal -nonfat milk, honey bunches of oats with almonds and banana and will have it with strawberries. Weekends will have waffles with some syrup or [place] sausage and eggs with toast. L: sandwich with low sodium ham or chicken with low fat mayo or mustard with carrot sticks with piece of fruit and diet soda. D: wife will cook dinner. protein vegetable and starch- uses pam or canola oil spray and sometimes olive oil and unsalted butter mixture. Lots of salad - olive oil and vinegar. will drink water for the rest of the day. usually will drink water. S: nuts, chocolate pudding, granola  bar. uses whole wheat bread or rye or oatmeal bread.      Intervention Plan   Intervention Prescribe, educate and counsel regarding individualized specific dietary modifications aiming towards targeted core components such as weight, hypertension, lipid management, diabetes, heart failure and other comorbidities.;Nutrition handout(s) given to patient.    Expected Outcomes Short Term Goal: Understand basic principles of dietary content, such as calories, fat, sodium, cholesterol and nutrients.;Short Term Goal: A plan has been developed with personal nutrition goals set during dietitian appointment.;Long Term Goal: Adherence to prescribed nutrition plan.           Nutrition Discharge:  Nutrition Assessments - 07/14/20 1428      MEDFICTS Scores   Post Score 0           Education Questionnaire Score:  Knowledge Questionnaire Score - 07/14/20 1428      Knowledge Questionnaire Score   Post Score 26/26           Goals reviewed with patient; copy given to patient.

## 2020-07-17 NOTE — Progress Notes (Signed)
Daily Session Note  Patient Details  Name: Daniel Luna MRN: [id number] Date of Birth: Jan 16, 1941 Referring Provider:     Cardiac Rehab from 04/28/2020 in [facility] Cardiac [facility]  Referring Provider Daniel Boyer MD      Encounter Date: 07/17/2020  Check In:  Session Check In - 07/17/20 1355      Check-In   Supervising physician immediately available to respond to emergencies See telemetry face sheet for immediately available ER MD    Location [facility]-Cardiac & Pulmonary Rehab    Staff Present Renita Papa, RN BSN;Joseph Hood RCP,RRT,BSRT;Laureen Garwin, [place], RRT, CPFT    Virtual Visit No    Medication changes reported     No    Fall or balance concerns reported    No    Warm-up and Cool-down Performed on first and last piece of equipment    Resistance Training Performed Yes    VAD Patient? No    PAD/SET Patient? No      Pain Assessment   Currently in Pain? No/denies              Social History   Tobacco Use  Smoking Status Former Smoker  . Packs/day: 2.00  . Years: 27.00  . Pack years: 54.00  . Types: Cigarettes  . Quit date: 12/21/1988  . Years since quitting: 31.5  Smokeless Tobacco Never Used    Goals Met:  Independence with exercise equipment Exercise tolerated well No report of cardiac concerns or symptoms Strength training completed today  Goals Unmet:  Not Applicable  Comments:  Daniel Luna graduated today from  rehab with 36 sessions completed.  Details of the patient's exercise prescription and what He needs to do in order to continue the prescription and progress were discussed with patient.  Patient was given a copy of prescription and goals.  Patient verbalized understanding.  Daniel Luna plans to continue to exercise by attending the [facility].     Dr. Emily Luna is Medical Director for [name].

## 2020-07-17 NOTE — Progress Notes (Signed)
Cardiac Individual Treatment Plan  Patient Details  Name: Daniel Luna MRN: [id number] Date of Birth: 08/05/1941 Referring Provider:     Cardiac Rehab from 04/28/2020 in [facility] Cardiac and Pulmonary Rehab  Referring Provider Posey Boyer MD      Initial Encounter Date:    Cardiac Rehab from 04/28/2020 in [facility] Cardiac and Pulmonary Rehab  Date 04/28/20      Visit Diagnosis: NSTEMI (non-ST elevated myocardial infarction) [facility])  Status post coronary artery stent placement  Patient's Home Medications on Admission:  Current Outpatient Medications:  .  ACCU-CHEK GUIDE test strip, , Disp: , Rfl:  .  acetaminophen (TYLENOL) 325 MG tablet, Take 2 tablets (650 mg total) by mouth every 4 (four) hours as needed for headache or mild pain., Disp:  , Rfl:  .  aspirin EC 81 MG EC tablet, Take 1 tablet (81 mg total) by mouth daily., Disp:  , Rfl:  .  atorvastatin (LIPITOR) 80 MG tablet, Take 1 tablet (80 mg total) by mouth daily., Disp:  , Rfl:  .  Calcium Polycarbophil (FIBER) 625 MG TABS, Take by mouth., Disp: , Rfl:  .  clopidogrel (PLAVIX) 75 MG tablet, Take 75 mg by mouth daily., Disp: , Rfl:  .  Cysteamine Bitartrate (PROCYSBI) 300 MG PACK, Use one strip to test blood sugars twice daily, Disp: , Rfl:  .  ENTRESTO 24-26 MG, Take 1 tablet by mouth 2 (two) times daily., Disp: , Rfl:  .  furosemide (LASIX) 10 MG/ML injection, Inject 2 mLs (20 mg total) into the vein 2 (two) times daily. (Patient not taking: Reported on 04/25/2020), Disp: 4 mL, Rfl: 0 .  heparin 25000-0.45 UT/250ML-% infusion, Inject 1,600 Units/hr into the vein continuous. (Patient not taking: Reported on 04/25/2020), Disp:  , Rfl:  .  insulin aspart (NOVOLOG) 100 UNIT/ML injection, Inject 0-5 Units into the skin at bedtime. (Patient not taking: Reported on 04/25/2020), Disp: 10 mL, Rfl: 11 .  insulin aspart (NOVOLOG) 100 UNIT/ML injection, Inject 0-9 Units into the skin 3 (three) times daily with meals. (Patient not taking: Reported  on 04/25/2020), Disp: 10 mL, Rfl: 11 .  JARDIANCE 10 MG TABS tablet, Take 10 mg by mouth daily., Disp: , Rfl:  .  losartan (COZAAR) 100 MG tablet, Take 1 tablet (100 mg total) by mouth daily. (Patient not taking: Reported on 04/25/2020), Disp:  , Rfl:  .  metFORMIN (GLUCOPHAGE) 1000 MG tablet, Take 1,000 mg by mouth 2 (two) times daily., Disp: , Rfl:  .  nitroGLYCERIN (NITROSTAT) 0.4 MG SL tablet, Place 1 tablet (0.4 mg total) under the tongue every 5 (five) minutes x 3 doses as needed for chest pain. (Patient not taking: Reported on 04/25/2020), Disp:  , Rfl: 12 .  sertraline (ZOLOFT) 50 MG tablet, Take 50 mg by mouth daily., Disp: , Rfl:   Past Medical History: Past Medical History:  Diagnosis Date  . Arthritis   . CHF (congestive heart failure) ([place])   . Coronary artery disease    CAD s/p PCI to LAD and RCA 01/2012   . Cyst of brain 1983  . Diabetes mellitus without complication ([place])   . Dysrhythmia   . Heart failure ([place])   . Hyperlipidemia   . Hypertension   . Ischemic cardiomyopathy    LVEF 32-54 % (systolic and diastolic, NYHA I-II symptoms)   . Macular degeneration   . Shortness of breath dyspnea     Tobacco Use: Social History   Tobacco Use  Smoking Status  Former Smoker  . Packs/day: 2.00  . Years: 27.00  . Pack years: 54.00  . Types: Cigarettes  . Quit date: 12/21/1988  . Years since quitting: 31.5  Smokeless Tobacco Never Used    Labs: Recent Review Scientist, physiological    Labs for ITP Cardiac and Pulmonary Rehab Latest Ref Rng & Units 05/07/2015 04/06/2020   Cholestrol 0 - 200 mg/dL - 127   LDLCALC 0 - 99 mg/dL - 81   HDL >40 mg/dL - 29(L)   Trlycerides <150 mg/dL - 83   Hemoglobin A1c 4.8 - 5.6 % 5.8 6.6(H)       Exercise Target Goals: Exercise Program Goal: Individual exercise prescription set using results from initial 6 min walk test and THRR while considering  patient's activity barriers and safety.   Exercise Prescription Goal: Initial exercise  prescription builds to 30-45 minutes a day of aerobic activity, 2-3 days per week.  Home exercise guidelines will be given to patient during program as part of exercise prescription that the participant will acknowledge.   Education: Aerobic Exercise & Resistance Training: - Gives group verbal and written instruction on the various components of exercise. Focuses on aerobic and resistive training programs and the benefits of this training and how to safely progress through these programs..   Education: Exercise & Equipment Safety: - Individual verbal instruction and demonstration of equipment use and safety with use of the equipment.   Cardiac Rehab from 07/16/2020 in [facility] Cardiac and Pulmonary Rehab  Date 04/28/20  Educator [facility]  Instruction Review Code 1- Verbalizes Understanding      Education: Exercise Physiology & General Exercise Guidelines: - Group verbal and written instruction with models to review the exercise physiology of the cardiovascular system and associated critical values. Provides general exercise guidelines with specific guidelines to those with heart or lung disease.    Cardiac Rehab from 07/16/2020 in [facility] Cardiac and Pulmonary Rehab  Date 06/25/20  Educator AS  Instruction Review Code 1- Verbalizes Understanding      Education: Flexibility, Balance, Mind/Body Relaxation: Provides group verbal/written instruction on the benefits of flexibility and balance training, including mind/body exercise modes such as yoga, pilates and tai chi.  Demonstration and skill practice provided.   Activity Barriers & Risk Stratification:  Activity Barriers & Cardiac Risk Stratification - 04/28/20 0944      Activity Barriers & Cardiac Risk Stratification   Activity Barriers Left Knee Replacement;Right Knee Replacement;Balance Concerns;Muscular Weakness;Deconditioning    Cardiac Risk Stratification High           6 Minute Walk:  6 Minute Walk    Row Name 04/28/20 0943 07/07/20  1418       6 Minute Walk   Phase Initial Discharge    Distance 1445 feet 1740 feet    Distance % Change -- 20.4 %    Distance Feet Change -- 295 ft    Walk Time 6 minutes 6 minutes    # of Rest Breaks 0 0    MPH 2.74 3.3    METS 3.04 3.56    RPE 10 14    VO2 Peak 10.64 12.48    Symptoms Yes (comment) No    Comments heavy gait on left side --    Resting HR 78 bpm 69 bpm    Resting BP 128/74 88/56    Resting Oxygen Saturation  99 % --    Exercise Oxygen Saturation  during 6 min walk 95 % --    Max Ex. HR  95 bpm 103 bpm    Max Ex. BP 148/72 140/64    2 Minute Post BP 134/62 --           Oxygen Initial Assessment:   Oxygen Re-Evaluation:   Oxygen Discharge (Final Oxygen Re-Evaluation):   Initial Exercise Prescription:  Initial Exercise Prescription - 04/28/20 0900      Date of Initial Exercise RX and Referring Provider   Date 04/28/20    Referring Provider Posey Boyer MD      Treadmill   MPH 2.5    Grade 0.5    Minutes 15    METs 3.09      Recumbant Bike   Level 3    RPM 50    Watts 32    Minutes 15    METs 3      NuStep   Level 3    SPM 80    Minutes 15    METs 3      Recumbant Elliptical   Level 1    RPM 50    Minutes 15    METs 3      Prescription Details   Frequency (times per week) 3    Duration Progress to 30 minutes of continuous aerobic without signs/symptoms of physical distress      Intensity   THRR 40-80% of Max Heartrate 104-131    Ratings of Perceived Exertion 11-13    Perceived Dyspnea 0-4      Progression   Progression Continue to progress workloads to maintain intensity without signs/symptoms of physical distress.      Resistance Training   Training Prescription Yes    Weight 3 lb    Reps 10-15           Perform Capillary Blood Glucose checks as needed.  Exercise Prescription Changes:  Exercise Prescription Changes    Row Name 04/28/20 0900 05/06/20 1500 05/22/20 1200 05/29/20 1400 06/02/20 1400      Response to Exercise   Blood Pressure (Admit) 1_0 -- 102/60   Blood Pressure (Exercise) 148/72 110/66 130/66 -- 130/58   Blood Pressure (Exit) 134/62 118/60 110/62 -- 124/62   Heart Rate (Admit) 78 bpm 79 bpm 65 bpm -- 75 bpm   Heart Rate (Exercise) 95 bpm 106 bpm 106 bpm -- 103 bpm   Heart Rate (Exit) 82 bpm 81 bpm 77 bpm -- 89 bpm   Oxygen Saturation (Admit) 99 % -- -- -- --   Oxygen Saturation (Exercise) 95 % -- -- -- --   Rating of Perceived Exertion (Exercise) _1 -- 15   Symptoms heavy on left foot none none -- none   Comments walk test results third full day of exercise -- -- --   Duration -- Continue with 30 min of aerobic exercise without signs/symptoms of physical distress. Continue with 30 min of aerobic exercise without signs/symptoms of physical distress. -- Continue with 30 min of aerobic exercise without signs/symptoms of physical distress.   Intensity -- THRR unchanged THRR unchanged -- THRR unchanged     Progression   Progression -- Continue to progress workloads to maintain intensity without signs/symptoms of physical distress. Continue to progress workloads to maintain intensity without signs/symptoms of physical distress. -- Continue to progress workloads to maintain intensity without signs/symptoms of physical distress.   Average METs -- 2.39 2.62 -- 3.48     Resistance Training   Training Prescription -- Yes Yes -- Yes   Weight -- 3 lb 3 lb --  4 lb   Reps -- 10-15 10-15 -- 10-15     Interval Training   Interval Training -- No No -- No     Treadmill   MPH -- 2.5 2.6 -- 2.8   Grade -- 0.5 0.5 -- 0.5   Minutes -- 15 15 -- 15   METs -- 3.09 3.17 -- 3.72     Recumbant Bike   Level -- 3 3 -- 3   Minutes -- 15 15 -- 15   METs -- 2.3 3 -- 4.2     NuStep   Level -- 3 3 -- 4   Minutes -- 15 15 -- 15   METs -- 2.5 3.3 -- 4     Recumbant Elliptical   Level -- 1 3 -- 1   Minutes -- 15 15 -- 15   METs -- 1.7 1.7 -- 2     Home Exercise Plan    Plans to continue exercise at -- -- -- Home (comment)  walking, [employer], [employer], [employer] (comment)  walking, [employer], [employer], videos   Frequency -- -- -- Add 2 additional days to program exercise sessions. Add 2 additional days to program exercise sessions.   Initial Home Exercises Provided -- -- -- 05/29/20 05/29/20   Row Name 06/18/20 1400 06/30/20 1300 07/15/20 1200         Response to Exercise   Blood Pressure (Admit) 128/74 118/62 124/60     Blood Pressure (Exercise) 146/64 158/70 128/64     Blood Pressure (Exit) 120/66 94/62 122/64     Heart Rate (Admit) 79 bpm 74 bpm 64 bpm     Heart Rate (Exercise) 118 bpm 105 bpm 105 bpm     Heart Rate (Exit) 91 bpm 82 bpm 90 bpm     Rating of Perceived Exertion (Exercise) _0 Symptoms none none none     Duration Continue with 30 min of aerobic exercise without signs/symptoms of physical distress. Continue with 30 min of aerobic exercise without signs/symptoms of physical distress. Continue with 30 min of aerobic exercise without signs/symptoms of physical distress.     Intensity THRR unchanged THRR unchanged THRR unchanged       Progression   Progression Continue to progress workloads to maintain intensity without signs/symptoms of physical distress. Continue to progress workloads to maintain intensity without signs/symptoms of physical distress. Continue to progress workloads to maintain intensity without signs/symptoms of physical distress.     Average METs 3.3 4.09 3.93       Resistance Training   Training Prescription Yes Yes Yes     Weight 4 lb 4 lb 5 lb     Reps 10-15 10-15 10-15       Interval Training   Interval Training -- No No       Treadmill   MPH 3 3.3 3.3     Grade 1.5 2.5 2.5     Minutes _1 METs 3.92 4.66 4.66       Recumbant Bike   Level -- 3 --     Minutes -- 15 --     METs -- 3.9 --       NuStep   Level -- 4 --     Minutes -- 15 --     METs -- 4.1 --       Recumbant Elliptical   Level  3 4 4.5     RPM 50 -- 50  Minutes _0 METs 2.7 3.7 3.2       Elliptical   Level -- 1 --     Speed -- 2.5 --     Minutes -- 3 --       Home Exercise Plan   Plans to continue exercise at Home (comment)  walking, [employer], [employer], videos Home (comment)  walking, [employer], [employer], videos --     Frequency Add 2 additional days to program exercise sessions. Add 2 additional days to program exercise sessions. --     Initial Home Exercises Provided 05/29/20 05/29/20 --            Exercise Comments:   Exercise Goals and Review:  Exercise Goals    Row Name 04/28/20 0948             Exercise Goals   Increase Physical Activity Yes       Intervention Provide advice, education, support and counseling about physical activity/exercise needs.;Develop an individualized exercise prescription for aerobic and resistive training based on initial evaluation findings, risk stratification, comorbidities and participant's personal goals.       Expected Outcomes Short Term: Attend rehab on a regular basis to increase amount of physical activity.;Long Term: Add in home exercise to make exercise part of routine and to increase amount of physical activity.;Long Term: Exercising regularly at least 3-5 days a week.       Increase Strength and Stamina Yes       Intervention Provide advice, education, support and counseling about physical activity/exercise needs.;Develop an individualized exercise prescription for aerobic and resistive training based on initial evaluation findings, risk stratification, comorbidities and participant's personal goals.       Expected Outcomes Short Term: Increase workloads from initial exercise prescription for resistance, speed, and METs.;Long Term: Improve cardiorespiratory fitness, muscular endurance and strength as measured by increased METs and functional capacity (6MWT);Short Term: Perform resistance training exercises routinely during rehab and add in resistance  training at home       Able to understand and use rate of perceived exertion (RPE) scale Yes       Intervention Provide education and explanation on how to use RPE scale       Expected Outcomes Short Term: Able to use RPE daily in rehab to express subjective intensity level;Long Term:  Able to use RPE to guide intensity level when exercising independently       Able to understand and use Dyspnea scale Yes       Intervention Provide education and explanation on how to use Dyspnea scale       Expected Outcomes Short Term: Able to use Dyspnea scale daily in rehab to express subjective sense of shortness of breath during exertion;Long Term: Able to use Dyspnea scale to guide intensity level when exercising independently       Knowledge and understanding of Target Heart Rate Range (THRR) Yes       Intervention Provide education and explanation of THRR including how the numbers were predicted and where they are located for reference       Expected Outcomes Short Term: Able to state/look up THRR;Short Term: Able to use daily as guideline for intensity in rehab;Long Term: Able to use THRR to govern intensity when exercising independently       Able to check pulse independently Yes       Intervention Provide education and demonstration on how to check pulse in carotid and radial arteries.;Review the importance  of being able to check your own pulse for safety during independent exercise       Expected Outcomes Short Term: Able to explain why pulse checking is important during independent exercise;Long Term: Able to check pulse independently and accurately       Understanding of Exercise Prescription Yes       Intervention Provide education, explanation, and written materials on patient's individual exercise prescription       Expected Outcomes Short Term: Able to explain program exercise prescription;Long Term: Able to explain home exercise prescription to exercise independently              Exercise  Goals Re-Evaluation :  Exercise Goals Re-Evaluation    Row Name 04/30/20 1335 05/06/20 1501 05/21/20 1336 05/29/20 1400 06/02/20 1436     Exercise Goal Re-Evaluation   Exercise Goals Review Increase Physical Activity;Able to understand and use rate of perceived exertion (RPE) scale;Knowledge and understanding of Target Heart Rate Range (THRR);Understanding of Exercise Prescription;Increase Strength and Stamina;Able to check pulse independently Increase Physical Activity;Increase Strength and Stamina;Understanding of Exercise Prescription Increase Physical Activity;Increase Strength and Stamina;Understanding of Exercise Prescription Increase Physical Activity;Increase Strength and Stamina;Understanding of Exercise Prescription;Able to understand and use rate of perceived exertion (RPE) scale;Able to understand and use Dyspnea scale;Knowledge and understanding of Target Heart Rate Range (THRR);Able to check pulse independently Increase Physical Activity;Increase Strength and Stamina;Understanding of Exercise Prescription   Comments Reviewed RPE and dyspnea scales, THR and program prescription with pt today.  Pt voiced understanding and was given a copy of goals to take home. Daniel Luna is off to a good start in rehab.  He has completed his first 3 full days of exericse.  He has had some issues with his sugar dropping after exercise and we will continue to monitor it. Daniel Luna is doing well in rehab.  He is already starting to feel better and has more energy.  We will review his home exercise guidelines soon. Reviewed home exercise with pt today.  Pt plans to walk and use staff videos for exercise.  He also belongs to the [facility] [employer] [facility] but is thinking about joining the [employer].  Reviewed THR, pulse, RPE, sign and symptoms, pulse oximetery and when to call 911 or MD.  Also discussed weather considerations and indoor options.  Pt voiced understanding. Daniel Luna is doing well in rehab.  He is up to 4.2 METs on the bike.  We will continue  to monitor his progress.   Expected Outcomes Short: Use RPE daily to regulate intensity. Long: Follow program prescription in THR. Short: Continue to attend regularly Long: Continue to follow program prescription Short: Review home exercise guidelines Long: Continue to improve stamina. Short: Add in more exercise at home and decide on [employer] vs [employer].  Long: Continue to improve stamina. Short: Increase recumbent elliptical  Long: Conitnue to increase exercise at home.   [place] Name 06/18/20 1421 06/30/20 1327 07/07/20 1446 07/15/20 1245       Exercise Goal Re-Evaluation   Exercise Goals Review Increase Physical Activity;Increase Strength and Stamina;Understanding of Exercise Prescription Increase Physical Activity;Increase Strength and Stamina;Understanding of Exercise Prescription Increase Physical Activity;Increase Strength and Stamina;Understanding of Exercise Prescription Increase Physical Activity;Increase Strength and Stamina;Understanding of Exercise Prescription    Comments Daniel Luna attends consistently and works in correct THR range.  Staff will encourage increasing weight for strength work.  He has increased workloads on TM and REL. Daniel Luna is doing well in rehab.  He is now attemtping the elliptical and last 3 min  his first day on there.  We will continue to monitor his progress. Daniel Luna improved his post 6MWT by 295 ft!!  He will be graduating next week.  He plans to continue to walk at the mall daily and is going to return to the [facility] with his [employer]neakers.  He is feeling better overall. --    Expected Outcomes Short: increase to 5 lb for strength Long:  continue to improve MET level Short: Continue to build up stamina on the elliptical  Long: Continue to improve stamina. Graduate and continue to exercise independently --           Discharge Exercise Prescription (Final Exercise Prescription Changes):  Exercise Prescription Changes - 07/15/20 1200      Response to Exercise   Blood Pressure  (Admit) 124/60    Blood Pressure (Exercise) 128/64    Blood Pressure (Exit) 122/64    Heart Rate (Admit) 64 bpm    Heart Rate (Exercise) 105 bpm    Heart Rate (Exit) 90 bpm    Rating of Perceived Exertion (Exercise) 15    Symptoms none    Duration Continue with 30 min of aerobic exercise without signs/symptoms of physical distress.    Intensity THRR unchanged      Progression   Progression Continue to progress workloads to maintain intensity without signs/symptoms of physical distress.    Average METs 3.93      Resistance Training   Training Prescription Yes    Weight 5 lb    Reps 10-15      Interval Training   Interval Training No      Treadmill   MPH 3.3    Grade 2.5    Minutes 15    METs 4.66      Recumbant Elliptical   Level 4.5    RPM 50    Minutes 15    METs 3.2           Nutrition:  Target Goals: Understanding of nutrition guidelines, daily intake of sodium <1535m, cholesterol <2074m calories 30% from fat and 7% or less from saturated fats, daily to have 5 or more servings of fruits and vegetables.  Education: Controlling Sodium/Reading Food Labels -Group verbal and written material supporting the discussion of sodium use in heart healthy nutrition. Review and explanation with models, verbal and written materials for utilization of the food label.   Education: General Nutrition Guidelines/Fats and Fiber: -Group instruction provided by verbal, written material, models and posters to present the general guidelines for heart healthy nutrition. Gives an explanation and review of dietary fats and fiber.   Biometrics:  Pre Biometrics - 04/28/20 1000      Pre Biometrics   Height 6' 2.5" (1.892 m)    Weight 213 lb 8 oz (96.8 kg)    BMI (Calculated) 27.05    Single Leg Stand 1.81 seconds            Nutrition Therapy Plan and Nutrition Goals:  Nutrition Therapy & Goals - 05/26/20 1431      Nutrition Therapy   Diet heart heathly, low Na, diabetes  friendly    Protein (specify units) 75-80g    Fiber 30 grams    Whole Grain Foods 3 servings    Saturated Fats 12 max. grams    Fruits and Vegetables 5 servings/day    Sodium 1.5 grams      Personal Nutrition Goals   Nutrition Goal LT: get back to activites and wlaking 6000 steps.  Comments 64oz. 6.8 a1c, now BG 120-130. coffee with splenda and nonfat creamer. Cereal -nonfat milk, honey bunches of oats with almonds and banana and will have it with strawberries. Weekends will have waffles with some syrup or [place] sausage and eggs with toast. L: sandwich with low sodium ham or chicken with low fat mayo or mustard with carrot sticks with piece of fruit and diet soda. D: wife will cook dinner. protein vegetable and starch- uses pam or canola oil spray and sometimes olive oil and unsalted butter mixture. Lots of salad - olive oil and vinegar. will drink water for the rest of the day. usually will drink water. S: nuts, chocolate pudding, granola bar. uses whole wheat bread or rye or oatmeal bread.      Intervention Plan   Intervention Prescribe, educate and counsel regarding individualized specific dietary modifications aiming towards targeted core components such as weight, hypertension, lipid management, diabetes, heart failure and other comorbidities.;Nutrition handout(s) given to patient.    Expected Outcomes Short Term Goal: Understand basic principles of dietary content, such as calories, fat, sodium, cholesterol and nutrients.;Short Term Goal: A plan has been developed with personal nutrition goals set during dietitian appointment.;Long Term Goal: Adherence to prescribed nutrition plan.           Nutrition Assessments:  Nutrition Assessments - 07/14/20 1428      MEDFICTS Scores   Post Score 0           MEDIFICTS Score Key:          ?70 Need to make dietary changes          40-70 Heart Healthy Diet         ? 40 Therapeutic Level Cholesterol Diet  Nutrition Goals Re-Evaluation:   Nutrition Goals Re-Evaluation    [place] Name 06/05/20 1342 06/30/20 1459           Goals   Current Weight 213 lb (96.6 kg) --      Nutrition Goal Lose weight and keep lipids controlled. Reduce cholesterol levels      Comment Daniel Luna has lost some weight and wants to continue. He has been dieting and wants to get down to 200lbs. Pt concerned about his cholesterol and does not want to go on medication where he needs to inject himself. Disucssed how to lower LDL cholesterol - gave fiber, low fat, and low Na handouts.      Expected Outcome Short: lose more weight with diet. Long: maintain a heart healthy diet Reduce cholesterol levels             Nutrition Goals Discharge (Final Nutrition Goals Re-Evaluation):  Nutrition Goals Re-Evaluation - 06/30/20 1459      Goals   Nutrition Goal Reduce cholesterol levels    Comment Pt concerned about his cholesterol and does not want to go on medication where he needs to inject himself. Disucssed how to lower LDL cholesterol - gave fiber, low fat, and low Na handouts.    Expected Outcome Reduce cholesterol levels           Psychosocial: Target Goals: Acknowledge presence or absence of significant depression and/or stress, maximize coping skills, provide positive support system. Participant is able to verbalize types and ability to use techniques and skills needed for reducing stress and depression.   Education: Depression - Provides group verbal and written instruction on the correlation between heart/lung disease and depressed mood, treatment options, and the stigmas associated with seeking treatment.   Cardiac Rehab from 07/16/2020  in [facility] Cardiac and Pulmonary Rehab  Date 07/16/20  Educator SB  Instruction Review Code 1- [employer] Understanding      Education: Sleep Hygiene -Provides group verbal and written instruction about how sleep can affect your health.  Define sleep hygiene, discuss sleep cycles and impact of sleep habits. Review good  sleep hygiene tips.     Education: Stress and Anxiety: - Provides group verbal and written instruction about the health risks of elevated stress and causes of high stress.  Discuss the correlation between heart/lung disease and anxiety and treatment options. Review healthy ways to manage with stress and anxiety.    Initial Review & Psychosocial Screening:  Initial Psych Review & Screening - 04/25/20 1307      Initial Review   Current issues with Current Stress Concerns      Family Dynamics   Good Support System? Yes      Barriers   Psychosocial barriers to participate in program There are no identifiable barriers or psychosocial needs.;The patient should benefit from training in stress management and relaxation.      Screening Interventions   Interventions Encouraged to exercise;To provide support and resources with identified psychosocial needs;Provide feedback about the scores to participant    Expected Outcomes Short Term goal: Utilizing psychosocial counselor, staff and physician to assist with identification of specific Stressors or current issues interfering with healing process. Setting desired goal for each stressor or current issue identified.;Long Term Goal: Stressors or current issues are controlled or eliminated.;Short Term goal: Identification and review with participant of any Quality of Life or Depression concerns found by scoring the questionnaire.;Long Term goal: The participant improves quality of Life and PHQ9 Scores as seen by post scores and/or verbalization of changes           Quality of Life Scores:   Quality of Life - 07/14/20 1429      Quality of Life Scores   Health/Function Pre 15.96 %    Health/Function Post 23.77 %    Health/Function % Change 48.93 %    Socioeconomic Pre 23.75 %    Socioeconomic Post 24.29 %    Socioeconomic % Change  2.27 %    Psych/Spiritual Pre 26.14 %    Psych/Spiritual Post 27.07 %    Psych/Spiritual % Change 3.56 %     Family Pre 28.8 %    Family Post 30 %    Family % Change 4.17 %    GLOBAL Pre 22.9 %    GLOBAL Post 25.47 %    GLOBAL % Change 11.22 %          Scores of 19 and below usually indicate a poorer quality of life in these areas.  A difference of  2-3 points is a clinically meaningful difference.  A difference of 2-3 points in the total score of the Quality of Life Index has been associated with significant improvement in overall quality of life, self-image, physical symptoms, and general health in studies assessing change in quality of life.  PHQ-9: Recent Review Flowsheet Data    Depression screen [facility] 2/9 07/14/2020 04/28/2020   Decreased Interest 0 0   Down, Depressed, Hopeless 0 0   PHQ - 2 Score 0 0   Altered sleeping 0 1   Tired, decreased energy 1 1   Change in appetite 0 0   Feeling bad or failure about yourself  0 0   Trouble concentrating 0 0   Moving slowly or fidgety/restless 0 0  Suicidal thoughts 0 0   PHQ-9 Score 1 2   Difficult doing work/chores Not difficult at all Not difficult at all     Interpretation of Total Score  Total Score Depression Severity:  1-4 = Minimal depression, 5-9 = Mild depression, 10-14 = Moderate depression, 15-19 = Moderately severe depression, 20-27 = Severe depression   Psychosocial Evaluation and Intervention:  Psychosocial Evaluation - 04/25/20 1312      Psychosocial Evaluation & Interventions   Comments Daniel Luna reports doing well. He stated he is on something to help with his stress given he's had a lot of health issues lately. He is looking forward to exercising. [facility] so he is ready to get back to feeling better.    Expected Outcomes Short: attend [facility] for exercise and education. Long: maintain positive self care habits           Psychosocial Re-Evaluation:  Psychosocial Re-Evaluation    [place] Name 05/21/20 1338 06/05/20 1338           Psychosocial Re-Evaluation   Current issues with None  Identified Current Anxiety/Panic      Comments Daniel Luna is doing well in rehab. He feels that he is in a good place mentally.  He has been able to get back to doing his yard work and more around the house.  He is sleeping well. Sometimes he has some anxiety. He is still getting used to being retired. He has been married for 39 years and sometimes still has arguments with his wife.      Expected Outcomes Short: Continue to attend rehab regularly Long: Continue to improve overall well being Short: continue to exercise to reduce stress. Long: maintain a stress free environment.      Interventions -- Encouraged to attend Cardiac Rehabilitation for the exercise      Continue Psychosocial Services  -- Follow up required by staff             Psychosocial Discharge (Final Psychosocial Re-Evaluation):  Psychosocial Re-Evaluation - 06/05/20 1338      Psychosocial Re-Evaluation   Current issues with Current Anxiety/Panic    Comments Sometimes he has some anxiety. He is still getting used to being retired. He has been married for 58 years and sometimes still has arguments with his wife.    Expected Outcomes Short: continue to exercise to reduce stress. Long: maintain a stress free environment.    Interventions Encouraged to attend Cardiac Rehabilitation for the exercise    Continue Psychosocial Services  Follow up required by staff           Vocational Rehabilitation: Provide vocational rehab assistance to qualifying candidates.   Vocational Rehab Evaluation & Intervention:  Vocational Rehab - 04/25/20 1307      Initial Vocational Rehab Evaluation & Intervention   Assessment shows need for Vocational Rehabilitation No           Education: Education Goals: Education classes will be provided on a variety of topics geared toward better understanding of heart health and risk factor modification. Participant will state understanding/return demonstration of topics presented as noted by education test  scores.  Learning Barriers/Preferences:  Learning Barriers/Preferences - 04/25/20 1307      Learning Barriers/Preferences   Learning Barriers None    Learning Preferences None           General Cardiac Education Topics:  AED/CPR: - Group verbal and written instruction with the use of models to demonstrate the  basic use of the AED with the basic ABC's of resuscitation.   Anatomy & Physiology of the Heart: - Group verbal and written instruction and models provide basic cardiac anatomy and physiology, with the coronary electrical and arterial systems. Review of Valvular disease and Heart Failure   Cardiac Procedures: - Group verbal and written instruction to review commonly prescribed medications for heart disease. Reviews the medication, class of the drug, and side effects. Includes the steps to properly store meds and maintain the prescription regimen. (beta blockers and nitrates)   Cardiac Medications I: - Group verbal and written instruction to review commonly prescribed medications for heart disease. Reviews the medication, class of the drug, and side effects. Includes the steps to properly store meds and maintain the prescription regimen.   Cardiac Rehab from 07/16/2020 in [facility] Cardiac and Pulmonary Rehab  Date 07/09/20  Educator SB  Instruction Review Code 1- Verbalizes Understanding      Cardiac Medications II: -Group verbal and written instruction to review commonly prescribed medications for heart disease. Reviews the medication, class of the drug, and side effects. (all other drug classes)    Go Sex-Intimacy & Heart Disease, Get SMART - Goal Setting: - Group verbal and written instruction through game format to discuss heart disease and the return to sexual intimacy. Provides group verbal and written material to discuss and apply goal setting through the application of the S.M.A.R.T. Method.   Other Matters of the Heart: - Provides group verbal, written materials  and models to describe Stable Angina and Peripheral Artery. Includes description of the disease process and treatment options available to the cardiac patient.   Infection Prevention: - Provides verbal and written material to individual with discussion of infection control including proper hand washing and proper equipment cleaning during exercise session.   Cardiac Rehab from 07/16/2020 in [facility] Cardiac and Pulmonary Rehab  Date 04/28/20  Educator [facility]  Instruction Review Code 1- Verbalizes Understanding      Falls Prevention: - Provides verbal and written material to individual with discussion of falls prevention and safety.   Cardiac Rehab from 07/16/2020 in [facility] Cardiac and Pulmonary Rehab  Date 04/28/20  Educator [facility]  Instruction Review Code 1- Verbalizes Understanding      Other: -Provides group and verbal instruction on various topics (see comments)   Knowledge Questionnaire Score:  Knowledge Questionnaire Score - 07/14/20 1428      Knowledge Questionnaire Score   Post Score 26/26           Core Components/Risk Factors/Patient Goals at Admission:  Personal Goals and Risk Factors at Admission - 04/28/20 1003      Core Components/Risk Factors/Patient Goals on Admission    Weight Management Yes;Weight Loss    Intervention Weight Management: Develop a combined nutrition and exercise program designed to reach desired caloric intake, while maintaining appropriate intake of nutrient and fiber, sodium and fats, and appropriate energy expenditure required for the weight goal.;Weight Management: Provide education and appropriate resources to help participant work on and attain dietary goals.    Admit Weight 213 lb 8 oz (96.8 kg)    Goal Weight: Short Term 208 lb (94.3 kg)    Goal Weight: Long Term 203 lb (92.1 kg)    Expected Outcomes Short Term: Continue to assess and modify interventions until short term weight is achieved;Long Term: Adherence to nutrition and physical  activity/exercise program aimed toward attainment of established weight goal;Weight Loss: Understanding of general recommendations for a balanced deficit meal plan, which  promotes 1-2 lb weight loss per week and includes a negative energy balance of [id number] kcal/d;Understanding recommendations for meals to include 15-35% energy as protein, 25-35% energy from fat, 35-60% energy from carbohydrates, less than 245m of dietary cholesterol, 20-35 gm of total fiber daily;Understanding of distribution of calorie intake throughout the day with the consumption of 4-5 meals/snacks    Diabetes Yes    Intervention Provide education about signs/symptoms and action to take for hypo/hyperglycemia.;Provide education about proper nutrition, including hydration, and aerobic/resistive exercise prescription along with prescribed medications to achieve blood glucose in normal ranges: Fasting glucose 65-99 mg/dL    Expected Outcomes Short Term: Participant verbalizes understanding of the signs/symptoms and immediate care of hyper/hypoglycemia, proper foot care and importance of medication, aerobic/resistive exercise and nutrition plan for blood glucose control.;Long Term: Attainment of HbA1C < 7%.    Hypertension Yes    Intervention Provide education on lifestyle modifcations including regular physical activity/exercise, weight management, moderate sodium restriction and increased consumption of fresh fruit, vegetables, and low fat dairy, alcohol moderation, and smoking cessation.;Monitor prescription use compliance.    Expected Outcomes Short Term: Continued assessment and intervention until BP is < 140/966mHG in hypertensive participants. < 130/8022mG in hypertensive participants with diabetes, heart failure or chronic kidney disease.;Long Term: Maintenance of blood pressure at goal levels.    Lipids Yes    Intervention Provide education and support for participant on nutrition & aerobic/resistive exercise along with  prescribed medications to achieve LDL <89m51mDL >40mg39m Expected Outcomes Short Term: Participant states understanding of desired cholesterol values and is compliant with medications prescribed. Participant is following exercise prescription and nutrition guidelines.;Long Term: Cholesterol controlled with medications as prescribed, with individualized exercise RX and with personalized nutrition plan. Value goals: LDL < 89mg,41m > 40 mg.           Education:Diabetes - Individual verbal and written instruction to review signs/symptoms of diabetes, desired ranges of glucose level fasting, after meals and with exercise. Acknowledge that pre and post exercise glucose checks will be done for 3 sessions at entry of program.   Education: Know Your Numbers and Risk Factors: -Group verbal and written instruction about important numbers in your health.  Discussion of what are risk factors and how they play a role in the disease process.  Review of Cholesterol, Blood Pressure, Diabetes, and BMI and the role they play in your overall health.   Core Components/Risk Factors/Patient Goals Review:   Goals and Risk Factor Review    Row Name 05/21/20 1340 06/05/20 1344 07/07/20 1447         Core Components/Risk Factors/Patient Goals Review   Personal Goals Review Weight Management/Obesity;Hypertension;Diabetes;Lipids Weight Management/Obesity;Diabetes;Lipids;Hypertension Weight Management/Obesity;Diabetes;Lipids;Hypertension     Review Daniel Luna iJonni Sangering well in rehab.  He has lost some weight already.  He is very good at keeping a close eye on the his numbers.  He weighs daily and also checks his blood pressures and blood sugars routinely throughout the day.  So far, his numbers are doing well and his blood sugars are at the lowest point in a few years! Andys sugar in the morning is 110-114. He is checking his blood sugar every day at home. His A1C has gone down since his last check. Daniel Luna hJonni Sangerone well in rehab.   He is into the routine now of checking his pressures and blood sugars.  He plans to continue to keep a close eye on these going forward.  Expected Outcomes Short: Continue to work on weight loss Long: Continue to monitor risk factors. Short: work on lowering fasting sugars with exercise and diet. Long: Maintain a controlled blood sugar. Continue to work on weight loss and monitoring risk factors.            Core Components/Risk Factors/Patient Goals at Discharge (Final Review):   Goals and Risk Factor Review - 07/07/20 1447      Core Components/Risk Factors/Patient Goals Review   Personal Goals Review Weight Management/Obesity;Diabetes;Lipids;Hypertension    Review Daniel Luna has done well in rehab.  He is into the routine now of checking his pressures and blood sugars.  He plans to continue to keep a close eye on these going forward.    Expected Outcomes Continue to work on weight loss and monitoring risk factors.           ITP Comments:  ITP Comments    Row Name 04/25/20 1315 04/28/20 0938 04/30/20 0610 04/30/20 1334 05/28/20 0616   ITP Comments Initial telephone encounter complete. Diagnosis can be found in [facility] 4/29. EP orientation scheduled for Monday 5/17 at 8:30 Completed 6MWT and gym orientation.  Initial ITP created and sent for review to Dr. Emily Filbert, Medical Director. 30 Day review completed. ITP review done, changes made as directed,and approval shown by signature of  Scientist, research (life sciences). First full day of exercise!  Patient was oriented to gym and equipment including functions, settings, policies, and procedures.  Patient's individual exercise prescription and treatment plan were reviewed.  All starting workloads were established based on the results of the 6 minute walk test done at initial orientation visit.  The plan for exercise progression was also introduced and progression will be customized based on patient's performance and goals. 30 Day review completed. Medical  Director ITP review done, changes made as directed, and signed approval by Medical Director.   [place] Name 06/25/20 0639 07/17/20 1356         ITP Comments 30 Day review completed. Medical Director ITP review done, changes made as directed, and signed approval by Medical Director. Daniel Luna graduated today from  rehab with 36 sessions completed.  Details of the patient's exercise prescription and what He needs to do in order to continue the prescription and progress were discussed with patient.  Patient was given a copy of prescription and goals.  Patient verbalized understanding.  Daniel Luna plans to continue to exercise by attending the [facility].             Comments: Discharge ITP

## 2020-07-29 DIAGNOSIS — I255 Ischemic cardiomyopathy: Principal | ICD-10-CM

## 2020-07-29 DIAGNOSIS — I5022 Chronic systolic (congestive) heart failure: Principal | ICD-10-CM

## 2020-08-04 ENCOUNTER — Ambulatory Visit: Admit: 2020-08-04 | Discharge: 2020-08-04 | Payer: MEDICARE

## 2020-08-04 LAB — BASIC METABOLIC PANEL
ANION GAP: 7 mmol/L (ref 5–14)
BLOOD UREA NITROGEN: 30 mg/dL — ABNORMAL HIGH (ref 9–23)
BUN / CREAT RATIO: 19
CALCIUM: 10.3 mg/dL (ref 8.7–10.4)
CHLORIDE: 106 mmol/L (ref 98–107)
CO2: 29 mmol/L (ref 20.0–31.0)
CREATININE: 1.54 mg/dL — ABNORMAL HIGH
EGFR CKD-EPI AA MALE: 50 mL/min/{1.73_m2} — ABNORMAL LOW (ref >=60)
EGFR CKD-EPI NON-AA MALE: 43 mL/min/{1.73_m2} — ABNORMAL LOW (ref >=60)
GLUCOSE RANDOM: 103 mg/dL — ABNORMAL HIGH (ref 70–99)
POTASSIUM: 4.8 mmol/L — ABNORMAL HIGH (ref 3.4–4.5)
SODIUM: 142 mmol/L (ref 135–145)

## 2020-08-04 LAB — CBC
HEMATOCRIT: 42.8 % (ref 41.0–53.0)
HEMOGLOBIN: 14.5 g/dL (ref 13.5–17.5)
MEAN CORPUSCULAR HEMOGLOBIN CONC: 33.9 g/dL (ref 31.0–37.0)
MEAN CORPUSCULAR HEMOGLOBIN: 33.5 pg (ref 26.0–34.0)
MEAN CORPUSCULAR VOLUME: 98.7 fL (ref 80.0–100.0)
MEAN PLATELET VOLUME: 9.4 fL (ref 7.0–10.0)
PLATELET COUNT: 169 10*9/L (ref 150–440)
RED BLOOD CELL COUNT: 4.34 10*12/L — ABNORMAL LOW (ref 4.50–5.90)
RED CELL DISTRIBUTION WIDTH: 13.3 % (ref 12.0–15.0)
WBC ADJUSTED: 5.4 10*9/L (ref 4.5–11.0)

## 2020-08-04 MED ORDER — OXYCODONE 5 MG TABLET
ORAL_TABLET | Freq: Four times a day (QID) | ORAL | 0 refills | 3 days | Status: CP | PRN
Start: 2020-08-04 — End: 2020-08-09

## 2020-08-04 MED ADMIN — midazolam (VERSED) injection: INTRAVENOUS | @ 18:00:00 | Stop: 2020-08-04

## 2020-08-04 MED ADMIN — iodixanoL (VISIPAQUE) 320 mg iodine/mL solution: @ 18:00:00 | Stop: 2020-08-04

## 2020-08-04 MED ADMIN — fentaNYL (PF) (SUBLIMAZE) injection: INTRAVENOUS | @ 17:00:00 | Stop: 2020-08-04

## 2020-08-04 MED ADMIN — fentaNYL (PF) (SUBLIMAZE) injection: INTRAVENOUS | @ 18:00:00 | Stop: 2020-08-04

## 2020-08-04 MED ADMIN — midazolam (VERSED) injection: INTRAVENOUS | @ 17:00:00 | Stop: 2020-08-04

## 2020-08-04 MED ADMIN — ceFAZolin (ANCEF) IVPB 2 g in 50 ml dextrose (premix): 2 g | INTRAVENOUS | @ 17:00:00 | Stop: 2020-08-04

## 2020-08-04 MED ADMIN — diphenhydrAMINE (BENADRYL) injection: 50 mg | INTRAVENOUS | @ 16:00:00 | Stop: 2020-08-04

## 2020-08-04 MED ADMIN — bupivacaine (PF) (MARCAINE) 0.25 % (2.5 mg/mL) 30 mL, lidocaine (XYLOCAINE) 20 mg/mL (2 %) 20 mL: @ 17:00:00 | Stop: 2020-08-04

## 2020-08-04 MED ADMIN — methylPREDNISolone sodium succinate (PF) (Solu-MEDROL) injection 125 mg: 125 mg | INTRAVENOUS | @ 16:00:00 | Stop: 2020-08-04

## 2020-08-04 MED ADMIN — iodixanoL (VISIPAQUE) 320 mg iodine/mL solution: @ 17:00:00 | Stop: 2020-08-04

## 2020-08-04 MED ADMIN — neomycin-polymixin B 40 MG-200,000/1000 mL NS irrigation bottle: @ 18:00:00 | Stop: 2020-08-04

## 2020-08-04 NOTE — Unmapped (Signed)
Pt received from EP lab, report from [place], [place]. Pt awake and alert. Pt denies pain or other complaints at this time.Bulky dressing to L upper chest CDI, no hematoma noted. Pt given sandwich and cola. Wife at bedside.

## 2020-08-04 NOTE — Unmapped (Signed)
D/C teaching reviewed with pt and wife, both verbalized understanding of teaching. Questions invited and answered.Dressing to L upper access site CDI.  Pt OOB to get dressed and ambulate without difficulty. PIV removed. Pt taken to d/c lobby via wheelchair by transporter. Pt instructed not to drive for 10 days, reports wife will be driving him home.

## 2020-08-04 NOTE — Unmapped (Signed)
POST-PROCEDURE PROGRESS NOTE   Admit Date: 08/04/2020       Attending: Dorcas Carrow, MD    Admitting Service: Cards Procedures (MDS)    Patient seen and evaluated post procedure.  I agree with H&P as recorded.    Pectoral incision site with dressing clean, dry and intact. No bleeding or hematoma noted.    Procedure:  08/04/2020 S/P implant of a [employer] biventricular BIV pacemaker See EP note for details.       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  * Maintain IV/Medlock per protocol.   * Vital signs per protocol.  * Neurovascular checks to left arm per protocol.  * Monitor incision site for bleeding or hematoma.  * Ice packs to incision 20 min per hour to assist with pain control and minimize bruising.   * Left arm restrictions x 5 weeks: Do not raise above level of shoulder and no lifting with left arm > 5lbs.   * Post procedure Check PA/LAT Chest Xray, 12 Lead EKG, and labs.    * Pain medication acetaminophen or oxycodone IR prn incision pain.   * Continue home medications.  * Hold metformin for 48 hours post procedure d/t contrast administration    2. HTN  * Continue home medications.     DISPO:  * Cardiology  * Floor status  * Anticipate discharge in PM after labs/EKG/xray reviewed and patient hemodynamically stable post-procedure.      FEN:   * No IVF  * Monitor/replete electrolytes prn  * Low sodium/heart healthy diet    GI/DVT:  * PPI ppx: GI proph not indicated as patient to be discharged <24 hours post-procedure.   * DVT ppx: No heparin or lovenox post procedure. Encourage ambulation.    [facility]ACT[facility]  LVEF: 15-20% Date: 06/18/20 Method: TTE  ACE/ARB: NO   If No-Reason/Contraindication: on Entresto   BB:  YES   If No-Reason/Contraindication: N/A   Aldosterone Antagonist:  NO   If No-Reason/Contraindication: To be addressed by outpatient provider       Vitals:    08/04/20 1230   BP: 150/82   Pulse: 60   Resp: 20   Temp:    SpO2: 100%     Weight:   Wt Readings from Last 3 Encounters:   08/04/20 94 kg (207 lb 4.8 oz)   05/28/20 97.2 kg (214 lb 3.2 oz)   04/23/20 97.7 kg (215 lb 6.4 oz)       LABS:   Lab Results   Component Value Date    WBC 6.2 04/25/2020    HGB 13.8 04/25/2020    HCT 40.0 04/25/2020    PLT 191 04/25/2020       Lab Results   Component Value Date    NA 135 04/25/2020    K 4.6 04/25/2020    CL 102 04/25/2020    CO2 23.6 04/25/2020    BUN 46 (H) 04/25/2020    CREATININE 1.64 (H) 04/25/2020    GLU 120 04/25/2020    CALCIUM 10.5 (H) 04/25/2020    MG 2.8 (H) 04/23/2020    PHOS 3.1 01/18/2012  Lab Results   Component Value Date    BILITOT 0.9 04/25/2020    PROT 7.9 04/25/2020    ALBUMIN 4.7 04/25/2020    ALT 31 04/25/2020    AST 33 04/25/2020    ALKPHOS 91 04/25/2020    GGT 23 01/18/2012       Lab Results   Component Value Date    INR 1.08 04/10/2020    APTT >400.0 (HH) 04/15/2020

## 2020-08-04 NOTE — Unmapped (Signed)
Cardiac Electrophysiology  History & Physical    History of Present Illness:   Douglas Beltran is a(n) 79 y.o. male with a history of HTN, HLD, T2DM, CKD, and??CHF 2/2??ischemic cardiomyopathy (EF 15% 04/2020) and CAD s/p multiple PCI who presents for biventricular ICD implantation.    The patient has ischemic cardiomyopathy and follows with Dr. Hessie Dibble in cardiology clinic. He has been noted to have persistently decreased LV systolic function despite treatment with GDMT. 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 He is therefore referred for biventricular ICD.     He was given a shared decisionmaking guide prior to the procedure.      His Past Medical History, Problem List, Family and Social History, Allergies, and Medication List have been reviewed and updated in Epic.    Review of Systems:  As stated in the HPI, otherwise 10-point review of systems is negative    Physical Exam:  There were no vitals taken for this visit.  EYES: No scleral icterus or pallor  ENT: Oropharynx clear  RESPIRATORY: Normal work of breathing  CARDIOVASCULAR: Extremities warm, well-perfused. No edema   GASTROINTESTINAL: Abdomen not distended, nontender  SKIN: Warm and dry  HEMATOLOGICAL: No ecchymoses noted  NEUROLOGICAL: Grossly nonfocal  PSYCHOLOGICAL: Mood is appropriate    Electrocardiogram:  ?? Pending     Labs, Reports, and available Outside records have been reviewed.    Lab Results   Component Value Date    WBC 6.2 04/25/2020    WBC 10.6 01/19/2012    HGB 13.8 04/25/2020    HGB 12.6 (L) 01/19/2012    HCT 40.0 04/25/2020    HCT 36.8 (L) 01/19/2012    Platelet 191 04/25/2020    Platelet 141 (L) 01/19/2012    INR 1.08 04/10/2020    INR 1.0 01/18/2012    Creatinine 1.64 (H) 04/25/2020    Creatinine 1.33 (H) 01/19/2012    Sodium 135 04/25/2020    Sodium 143 01/19/2012    Potassium 4.6 04/25/2020    Potassium 4.5 01/19/2012    Magnesium 2.8 (H) 04/23/2020    Magnesium 2.2 01/18/2012    PRO-BNP 4,040.0 (H) 04/23/2020 ASSESSMENT AND PLAN   Douglas Beltran is a(n) 79 y.o. male with the above stated history who has been referred for biventricular ICD.    SITE MARKING ATTESTATION   Site Marked: Yes    CONSENT FOR OPERATION OR PROCEDURE: PROVIDER CERTIFICATION   I hereby certify that the nature, purpose, benefits, usual and most frequent risks of, and alternatives to, the operation or procedure have been explained to the patient (or person authorized to sign for the patient) either by a physician or by the provider who is to perform the operation or procedure, that the patient has had an opportunity to ask questions, and that those questions have been answered. The patient or the patient's representative has been advised that selected tasks may be performed by assistants to the primary health care provider(s). I believe that the patient (or person authorized to sign for the patient) understands what has been explained, and has consented to the operation or procedure.

## 2020-08-05 ENCOUNTER — Encounter: Admit: 2020-08-05 | Discharge: 2020-08-06 | Payer: MEDICARE

## 2020-08-05 DIAGNOSIS — Z45018 Encounter for adjustment and management of other part of cardiac pacemaker: Principal | ICD-10-CM

## 2020-08-05 NOTE — Unmapped (Signed)
Post Device Implant Telephone Follow-Up  Device Implanted: Defibrillator  Date of Implant: 08/04/20    Manufacturer: [employer]  Implanting MD: Sheran Luz, MD      Instructions/Education provided to: Spoke with patient   -Incision care instructions given to patient and questions answered.  -Remote monitoring set up? Yes.  -Follow up appointments confirmed with patient? Yes.  -Mychart activated for patient? Yes.    My [facility] Chart allows you to manage your health, send messages to your provider, view your test results,  schedule and manage appointments, and request prescription refills securely and conveniently from your  computer or mobile device.  To activate your account, go to [email or web address]  If you need assistance with My [facility] Chart, call  [facility] at [phone].      Incision Care ???   -No baths for 4 weeks  Do not put any ointment, cream, or lotion on your incision.   If you find any redness, swelling, oozing, warmth, or have a fever over 100 degrees,   call the EP Device Clinic at [phone] during normal business hours, 0800 am - 0400 pm.     Activity ??? You can keep doing regular activities, but for 6 weeks:  Do not strain the arm on the same side as your pacemaker.    Do not pull yourself up with that arm.  Do not raise that elbow higher than your shoulder height.   Do not lift anything heavier than 10 pounds with that arm.  Do not do any activities like golfing, vacuuming, or mowing the lawn.  Never let anything hit or harm the pacemaker site.    Driving ??? If you feel up to it, you can drive in 7 days. Start with local/short trips to places you know. Avoid busy areas for the first few days.  Do not drive until you have stopped taking pain pills for 24 hours.     Things to Avoid ??? Avoid ham radios, arc welders, battery-powered tools, and strong magnetic fields.  Avoid medical devices like electric muscle stimulators or TENS units. Avoid standing over a running car motor with the hood up. When using a cell phone, use the ear on the other side of your pacemaker.    Other Things to Know ???  Let ALL medical providers know that you have a pacemaker.  Your pacemaker may set off a metal detector, like those used in airports and courtrooms. Let security know you have a pacemaker and show them your ID card.  It is okay to go through airport body scanners. They will not harm your pacemaker.    ID Card ??? An ID card has been given to you, identifying your implanted device serial number and implant information. CARRY YOUR ID CARD WITH YOU AT ALL TIMES.   All device related questions should be directed to the [facility] EP DEVICE CLINIC by calling [phone]. You may also email [place] at [email or web address]     Remote Follow-Up:   Your physician has ordered remote monitoring of your device.   Please connect your home monitor as directed.   You will have scheduled appointments from home either every 30 days or 90 days depending on your diagnosis and reason for implant. You will have a yearly in person device check with the device clinic approximately 1 year from today. This may change if setting adjustments or software updates are needed.     REMINDER:  PLEASE DON???T TRANSMIT UNLESS YOU HAVE TALKED WITH SOMEONE FROM THE  EP REMOTE MONITORING TEAM [phone]) AND HAVE RECEIVED INSTRUCTIONS TO SEND A UNSCHEDULED TRANSMISSION.  UNAUTHORIZED TRANSMISSIONS MAY RESULT IN DELAY OF TREATMENT OR UNVIEWED DATA.    Insurance and Co-Pays  The device data we review from your monitor is comparable to an in-office appointment.  Therefore, your insurance company will be billed for review of your data.  If you are required from your insurance company to give [place] a referral for your office visit, you would also be responsible for acquiring the referral and sending it to [place] prior to your transmission.  Without a referral you could be responsible for payment.    Traveling  If you are going out of town, you can take your monitor with you within the [place]    Travel for less than a 2 week period does not require you to take your monitor with you, but remember you will not be monitored during that period.     Questions:   Our staff is here Monday through Friday, from 8:00 a.m. ??? 4:00 p.m. If you leave a message, you should get a call back within 24 hours.     IMPORTANT PHONE NUMBERS:   EP Device Clinic: - [phone] (Monday-Friday, 8:00 a.m. - 4:00 p.m.)    For ICDs  If you receive ONE shock If you receive ONE shock If you receive TWO or MORE shocks   You have no symptoms You have symptoms:    Chest pain/pressure  Shortness of breath  Rapid heart action  Dizziness  Generally don't feel well    Call your heart doctor to discuss the shock and arrange for appropriate follow-up Call 911. Follow-up with a call to your doctor. Call 911 immediately. Follow-up with a call to your heart doctor.       Cardiology Fellow On-Call:  After hours and weekend emergency calls should be directed to the hospital operator at [phone]. Please ask for the CICU Fellow on call.   [place] EP Device Clinic Phone [phone] ??? Fax [phone]    All questions regarding your device, home monitor, ID CARDS, or to update your information should be directed to your device company listed below.    [employer]  [phone]     Future Appointments   Date Time Provider Department Center   08/05/2020 11:30 AM [facility] EP REMOTE MONITORING BILLING ONLY [facility]   08/13/2020  2:30 PM [facility] EP DEVICE [place] [facility]   08/15/2020  9:30 AM Carin Hock, MD [facility]   08/19/2020  8:40 AM [facility] EP FELLOW [facility]       CPT Code 10932355 HC Remote PHYS Monitor initial set up patient education

## 2020-08-13 ENCOUNTER — Encounter: Admit: 2020-08-13 | Discharge: 2020-08-14 | Payer: MEDICARE

## 2020-08-13 DIAGNOSIS — I5022 Chronic systolic (congestive) heart failure: Principal | ICD-10-CM

## 2020-08-13 NOTE — Unmapped (Signed)
Incision Care ???   No baths for 4 weeks  Do not put any ointment, cream, or lotion on your incision.   If you find any redness, swelling, oozing, warmth, or have a fever over 100 degrees,   call the EP Device Clinic at [phone] during normal business hours.     Activity ??? You can keep doing regular activities, but for 5 weeks:  Do not strain the arm on the same side as your cardiac device.    Do not pull yourself up with that arm.  Do not raise that elbow higher than your shoulder height.   Do not lift anything heavier than 10 pounds with that arm.  Do not do any activities like golfing, vacuuming, or mowing the lawn.  Never let anything hit or harm the site.    Driving -    If you feel up to it, you can drive in 10 days. Start with local/short trips to places you know. Avoid busy areas for the first few days.  Do not drive until you have stopped taking pain pills for 24 hours.    Emergencies  Seek medical attention immediately or call 911 if you are not feeling well.  If you are feeling symptoms that you think are related to your cardiac device please call the Device Clinic at [phone] Monday-Friday 7am-5:00PM and request to send a transmission to have it reviewed by a clinician.  After hours please page the Cardiology Fellow on-call at [facility] [phone]    ICD SHOCKS: If you get 1 shock from your cardiac device and you feel well, call the EP Device Clinic at [phone] (Monday ??? Friday and leave a message).    If you get more than 1 shock OR you don???t feel well, go to nearest emergency care and have the doctor page the Cardiology Fellow on- call at [facility] [phone].  If you receive ONE shock If you receive ONE shock If you receive TWO or MORE shocks   You have no symptoms You have symptoms:    Chest pain/pressure  Shortness of breath  Rapid heart action  Dizziness  Generally don't feel well    Call your heart doctor to discuss the shock and arrange for appropriate follow-up Call 911. Follow-up with a call to your doctor. Call 911 immediately. Follow-up with a call to your heart doctor.         Things to Avoid ??? Avoid ham radios, arc welders, battery-powered tools, and strong magnetic fields.  Avoid medical devices like electric muscle stimulators or TENS units. Avoid standing over a running car motor with the hood up. When using a cell phone, use the ear on the other side of your pacemaker.    Other Things to Know ???  Let ALL medical providers know that you have a cardiac device.  Your cardiac device may set off a metal detector, like those used in airports and courtrooms. Let security know you have a pacemaker and show them your ID card.  It is okay to go through airport body scanners. They will not harm your cardiac device.    ID Card ??? An ID card has been given to you, identifying your implanted device serial number and implant information. CARRY YOUR ID CARD WITH YOU AT ALL TIMES.   All device related questions should be directed to the [facility] EP DEVICE CLINIC by calling [phone]. You may also email [place] at [email or web address]     Remote Follow-Up:  Your physician has ordered remote monitoring of your device.   Please connect your home monitor as directed.   You will have scheduled appointments from home either every 30 days or 90 days depending on your diagnosis and reason for implant. You will have a yearly in person device check with the device clinic approximately 1 year from today. This may change if setting adjustments or software updates are needed.     REMINDER:  PLEASE DON???T TRANSMIT UNLESS YOU HAVE TALKED WITH SOMEONE FROM THE EP REMOTE MONITORING TEAM [phone]) AND HAVE RECEIVED INSTRUCTIONS TO SEND A UNSCHEDULED TRANSMISSION.  UNAUTHORIZED TRANSMISSIONS MAY RESULT IN DELAY OF TREATMENT OR UNVIEWED DATA.    Insurance and Co-Pays  The device data we review from your monitor is comparable to an in-office appointment.  Therefore, your insurance company will be billed for review of your data.  If you are required from your insurance company to give [place] a referral for your office visit, you would also be responsible for acquiring the referral and sending it to [place] prior to your transmission.  Without a referral you could be responsible for payment.    Traveling  If you are going out of town, you can take your monitor with you within the [place]    Travel for less than a 2 week period does not require you to take your monitor with you, but remember you will not be monitored during that period.     Questions:   Our staff is here Monday through Friday, from 8:00 a.m. ??? 4:00 p.m. If you leave a message, you should get a call back within 24 hours.     IMPORTANT PHONE NUMBERS:   EP Device Clinic: - [phone] (Monday-Friday, 8:00 a.m. - 4:00 p.m.):      Cardiology Fellow On-Call:  After hours and weekend emergency calls should be directed to the hospital operator at [phone]. Please ask for the CICU Fellow on call.    EP Device Clinic Phone [phone] ??? Fax [phone]    All questions regarding your device, home monitor, ID CARDS, or to update your information should be directed to your device company listed below.    [employer] [phone]  [employer] [phone]  [employer]  [phone]. [employer] [facility]) [phone]  Future Appointments   Date Time Provider Department Center   08/13/2020  2:30 PM Skipper Cliche, RN [facility]   08/15/2020  9:30 AM Carin Hock, MD [facility]   08/19/2020  8:40 AM [facility] EP FELLOW [facility]   09/08/2020 12:00 AM [place] [facility]   09/18/2020 12:00 AM [place] EP REMOTE MONITORING [facility]   10/13/2020 12:00 AM [place] [facility]   11/17/2020 12:00 AM [place] [facility]   12/18/2020 12:00 AM [place] EP REMOTE MONITORING [facility]   03/19/2021 12:00 AM [place] EP REMOTE MONITORING [facility] 06/18/2021 12:00 AM  EP REMOTE MONITORING [facility]

## 2020-08-13 NOTE — Unmapped (Signed)
[facility]  Cardiac Device Clinic  Tel: [phone]  Fax: [phone]    Incision check /Cardiac Implanted Electronic Device Evaluation-In Person New Implant/Generator Change    Visit Date:  08/13/2020    Incision Site:   Assessment:  1. Well-healing incision site without any evidence of complications.  Incision edges well approximated with no evidence of infection or hematoma..      Plan:  1. Patient instructed about Left arm restrictions for the next 2 months.      Indication for Implant: heart failure   Mode:  DDDR  Lower rate limit 50 bpm  Upper Tracking Limit 130 bpm  Tachy Settings:    Battery: 8 years estimated longevity    Presenting rhythm:  AS/Bi-VP  Underlying rhythm: NSR   Dependent: No    Pacing Percentages  AP: 9.9 %  Bi-VP: 96.8 %    Impedence (ohms)  Atrial lead: 437  Right Ventricular lead: 399  Left Ventricular lead: 684  Shock: 52     Sensing (mV)  Atrial lead:2.9 mV  Right Ventricular lead: 7.8 mV     Capture threshold (V @ ms)  Atrial lead: 0.75 V @ 0.5 ms  Right Ventricular lead: 0.5 V @ 0.4 ms  Left Ventricular lead. 1.5 V@ 0.4 ms    Diagnostics/Episodes  ?? None    Fluid Monitoring  ??  Not Applicable     Reprogramming  ?? none    Follow up: Schedule Clinic Follow-Up in 3 months  Continue Routine Remote Monitoring  Home monitoring education with set-up instructions given to patient/family member.  All questions answered.    Please see scanned and / or downloaded PDF file in this patient's chart for this encounter for full details of device interrogation and reprogramming.

## 2020-08-15 ENCOUNTER — Ambulatory Visit
Admit: 2020-08-15 | Discharge: 2020-08-16 | Payer: MEDICARE | Attending: Cardiovascular Disease | Primary: Cardiovascular Disease

## 2020-08-15 DIAGNOSIS — N183 Stage 3 chronic kidney disease, unspecified whether stage 3a or 3b CKD: Principal | ICD-10-CM

## 2020-08-15 DIAGNOSIS — I255 Ischemic cardiomyopathy: Principal | ICD-10-CM

## 2020-08-15 DIAGNOSIS — E875 Hyperkalemia: Principal | ICD-10-CM

## 2020-08-15 DIAGNOSIS — I2510 Atherosclerotic heart disease of native coronary artery without angina pectoris: Principal | ICD-10-CM

## 2020-08-15 DIAGNOSIS — I5022 Chronic systolic (congestive) heart failure: Principal | ICD-10-CM

## 2020-08-15 MED ORDER — FUROSEMIDE 20 MG TABLET
ORAL_TABLET | ORAL | 3 refills | 180.00000 days | Status: CP
Start: 2020-08-15 — End: ?

## 2020-08-15 MED ORDER — SACUBITRIL 24 MG-VALSARTAN 26 MG TABLET
ORAL_TABLET | ORAL | 2 refills | 32.00000 days | Status: CP
Start: 2020-08-15 — End: 2020-11-13

## 2020-08-15 NOTE — Unmapped (Signed)
[facility] Clinic New Patient/Consult Note    Referring Provider: Lorretta Harp, ANP   Lorretta Harp, ANP  [address]  Medicine  [id number] [place] [place],  [place] [address]   Primary Provider: Marguarite Arbour, MD   [address] [facility] [place] [address]       Reason for Visit:  Douglas Beltran is a 79 y.o. male with history of severe three-vessel CAD, type 2 diabetes, HLD, MDD referred by Lorretta Harp, ANP for management of systolic and diastolic heart failure.    Assessment & Plan:    1.  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  Recently admitted to med D in May of this year with NSTEMI found to have worsening HFrEF with EF of 15-20%, grade 2 diastolic dysfunction most recently on echo on 06/18/2020.  Recently underwent BiV ICD placement on 08/04/2020.  Overall well compensated, walked around the mall recently with minimal dyspnea on exertion.  Euvolemic on exam today.  Most recent labs last week showed potassium of 4.8 preventing initiation of spironolactone today.    ???Increase Entresto 24-26 mg tablets to 1 tablet in the morning and 1.5 tablets in the evening  ???Decrease Lasix to 20 mg every other day  ???Continue carvedilol 12.5 mg twice daily  ???Continue Jardiance 10 mg daily  ???Repeat labs next week with PCP, if potassium is improved will start spironolactone    2. Coronary artery disease: Diagnosed with multivessel disease in 2013 with subsequent PCI.  NSTEMI with PCI to proximal LAD and mid RCA in May 2021.  No symptoms of angina at this time.  ???Continue aspirin, Plavix, atorvastatin 80 mg  ???On evolocumab        History of Present Illness:  Douglas Beltran with history of severe three-vessel CAD, type 2 diabetes, HLD, MDDis being seen at the request of Lorretta Harp, ANP for heart failure management.      The patient's cardiac history started 2013 when he presented to [facility] with shortness of breath and chest tightness was found to have extensive CAD.  At the time was referred for CABG, but patient opted for PCI with placement of stents to 80% proximal mid LAD lesion, 70% mid RCA lesion, and 80% distal RCA stenosis.  At approximately the same time he was diagnosed with ischemic cardiomyopathy, at the time EF 30-35%.  He had been in his usual state of health until May of this year.  While walking around the mall, had significant shortness of breath.  Over the following days his shortness of breath became progressively worse in addition to the start of chest tightness and pressure which prompted him to go to the urgent care, and subsequently sent to the emergency room at [facility].  At that time he underwent left and right heart cath which showed severe three-vessel disease, and an echo done at that time showed an EF of 15-20%.  He was subsequently referred to [facility] for CABG eval versus complex PCI.      Patient had cardiac MRI done on 5 3 which showed a severe wall motion abnormalities, EF approximately 19%, and significant scarring of apical septal wall but otherwise had viable cardiac tissue, but was deemed to high risk for CABG.  He underwent PCI with 2 DES placed to LM/LAD and RCA.  Since his hospitalization, patient has  undergone placement of a BiV ICD.  Repeat echo 2 months following PCI showed similar results to prior, EF of 15-20%, grade 2 diastolic dysfunction.    Over this time since discharge, patient has been able to take walks around the mall but slower than previously.  Does have some dyspnea on exertion when he walks too fast, or walks for an extended period of time around the mall.  No chest tightness or pressure with exertion.  Denies any lower extremity edema.  Lies flat at night with one pillow.  Reports some intermittent dizziness with standing up in the morning, no syncopal events.              Cardiovascular History & Procedures:  Cath / PCI:  PCI 04/15/20:  PCI was performed of the??99%??heavily calcificed Left Main and??LAD. Rotational atherectomy was performed using a 1.75 mm burr. PCI was performed with a 4.0x38 Xience (LM-LAD).??  ??  PCI was performed of the??90%??heavily calcificed??RCA. Rotational atherectomy was performed using a 1.75 mm burr. PCI was performed with a 4.0x38 and 3.5x30 with rota (post to 4.5).   ??  Excellent??angiographic result with TIMI 3??flow.??    CV Surgery: None    EP Procedures and Devices:  BiV ICD placed 08/04/20    Non-Invasive Evaluation(s):  Echo:  06/18/20:  Summary    1. Limited study to assess ventricular function.    2. The left ventricle is mildly dilated in size with mildly increased wall  thickness.    3. The left ventricular systolic function is severely decreased, LVEF is  visually estimated at 15-20%.    4. The inferior wall, basal inferolateral wall, and mid inferolateral wall  are akinetic.    5. There is grade II diastolic dysfunction (elevated filling pressure).    6. The left atrium is mildly dilated in size.    7. The aortic valve is probably trileaflet with mildly thickened leaflets  with normal excursion.    8. The right ventricle is mildly dilated in size, with normal systolic  function.    CT/MRI/Nuclear Tests:  Cardiac MRI 04/14/20:    Left ventricle is dilated (index LVEDV is 191 mL/m2) and there are severe wall motion abnormalities with reduced ejection fraction (LVEF is 19%).  ??  There is scarring of the apical septal wall involving 50-75% thickness. Otherwise, no other areas of myocardial scarring/fibrosis can be convincingly identified.  ??  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     Past Medical History:   Diagnosis Date   ??? CHF (congestive heart failure) (CMS-HCC)    ??? Coronary artery disease    ??? Diabetes mellitus (CMS-HCC)    ??? Hypertension 07/12/2014   ??? Macrocytic anemia    ??? Mixed hyperlipidemia 07/12/2014        Past Surgical History:   Procedure Laterality Date   ??? ABDOMINAL SURGERY     ??? EYE SURGERY     ??? JOINT REPLACEMENT     ??? PR CATH PLACE/CORON ANGIO, IMG SUPER/INTERP,W LEFT HEART VENTRICULOGRAPHY N/A 04/15/2020    Procedure: CATH LEFT HEART CATHETERIZATION W INTERVENTION;  Surgeon: Rosana Hoes, MD;  Location: [facility] CATH;  Service: Cardiology   ??? PR INSJ/RPLCMT PERM DFB W/TRNSVNS LDS 1/DUAL CHMBR N/A 08/04/2020    Procedure: Implant Biventricular ICD System;  Surgeon: Dorcas Carrow, MD;  Location: [facility] EP;  Service: Cardiology         Allergies:  Iodine    Current Medications:  Current Outpatient  Medications   Medication Sig Dispense Refill   ??? acetaminophen (TYLENOL) 500 MG tablet Take 500 mg by mouth every six (6) hours as needed for pain.     ??? aspirin (ECOTRIN) 81 MG tablet Take 81 mg by mouth daily.      ??? atorvastatin (LIPITOR) 80 MG tablet Take 1 tablet (80 mg total) by mouth daily. 90 tablet 3   ??? calcium polycarbophiL (FIBERCON) 625 mg tablet Take 1,250 mg by mouth nightly.     ??? carvediloL (COREG) 12.5 MG tablet Take 1 tablet (12.5 mg total) by mouth Two (2) times a day. 180 tablet 3   ??? clopidogreL (PLAVIX) 75 mg tablet Take 1 tablet (75 mg total) by mouth daily. 90 tablet 3   ??? empagliflozin (JARDIANCE) 10 mg tablet Take 1 tablet (10 mg total) by mouth daily. 30 tablet 11   ??? empty container (SHARPS-A-GATOR DISPOSAL SYSTEM) Misc Use as directed for sharps disposal 1 each 2   ??? evolocumab 140 mg/mL PnIj Inject the contents of 1 pen (140 mg) under the skin every fourteen (14) days. 4 mL 11   ??? furosemide (LASIX) 20 MG tablet Take 1 tablet (20 mg total) by mouth once daily. 90 tablet 3   ??? metFORMIN (GLUCOPHAGE) 1000 MG tablet Take 1,000 mg by mouth.     ??? sacubitriL-valsartan (ENTRESTO) 24-26 mg tablet Take 1 tablet by mouth daily AND 1.5 tablets every evening. 80 tablet 2   ??? sertraline (ZOLOFT) 50 MG tablet Take 50 mg by mouth daily.       No current facility-administered medications for this visit.       Family History:  There is no family history of premature coronary artery disease or sudden cardiac death.    Social history:  Former smoker, quit 31 years ago    Review of Systems:  A full review of 10 systems is unremarkable except as stated in the HPI.     Physical Exam:  VITAL SIGNS:   Vitals:    08/15/20 0935   BP: 110/80   Pulse: 59   SpO2: 96%      Wt Readings from Last 3 Encounters:   08/15/20 96.9 kg (213 lb 9.6 oz)   08/04/20 94 kg (207 lb 4.8 oz)   05/28/20 97.2 kg (214 lb 3.2 oz)      Today's Body mass index is 26.7 kg/m??.  GENERAL: Well-appearing in no acute distress  HEENT: Normocephalic and atraumatic with anicteric sclerae    NECK: Supple, without lymphadenopathy or thyromegaly. JVP not elevated. There are no carotid bruits  CARDIOVASCULAR: RRR, S1/S2 heard, no S3, no murmurs or gallops  RESPIRATORY: Clear to auscultation without wheezes or crackles.   ABDOMEN: Soft, non-tender, non-distended with audible bowel sounds. There is no organomegaly or palpable pulsatile mass.   EXTREMITIES:  Lower extremities are warm and without edema. Distal pulses are full and symmetric.  SKIN: No rashes, ecchymosis or petechiae.  NEURO: Alert, pleasant, and appropriate. Non-focal neuro exam    Pertinent Laboratory Studies:   Lab Results   Component Value Date    PRO-BNP 4,040.0 (H) 04/23/2020    PRO-BNP 4,710.0 (H) 04/10/2020    Creatinine 1.54 (H) 08/04/2020    Creatinine 1.64 (H) 04/25/2020    Creatinine 1.33 (H) 01/19/2012    Creatinine 1.19 01/18/2012    BUN 30 (H) 08/04/2020    BUN 46 (H) 04/25/2020    BUN 33 (H) 01/19/2012    BUN 29 (H) 01/18/2012  Potassium 4.8 (H) 08/04/2020    Potassium 4.6 04/25/2020    Potassium 4.5 01/19/2012    Potassium 4.7 01/18/2012    Magnesium 2.8 (H) 04/23/2020    Magnesium 2.5 (H) 04/16/2020    Magnesium 2.2 01/18/2012    AST 33 04/25/2020    AST 33 04/10/2020    AST 28 01/18/2012    ALT 31 04/25/2020    ALT 42 04/10/2020    ALT 29 01/18/2012    Total Bilirubin 0.9 04/25/2020    Total Bilirubin 0.5 04/10/2020    Total Bilirubin 0.5 01/18/2012    INR 1.08 04/10/2020    INR 1.0 01/18/2012    WBC 5.4 08/04/2020    WBC 10.6 01/19/2012    HGB 14.5 08/04/2020    HGB 12.6 (L) 01/19/2012    HCT 42.8 08/04/2020    HCT 36.8 (L) 01/19/2012    Platelet 169 08/04/2020    Platelet 141 (L) 01/19/2012    Triglycerides 215 (H) 05/17/2012    HDL 34 (L) 05/17/2012    Non HDL Chol.  155 05/17/2012    LDL Cholesterol, Calculated 112 05/17/2012       Other pertinent records were reviewed.    Pertinent Test Results from Today:  ECG: A-Sensed, V-paced without other abnormal findings

## 2020-08-15 NOTE — Unmapped (Signed)
Today,    MEDICATIONS:  We are changing your medications today.  --INCREASE your Entresto to 1 tablet in the morning, 1.5 tablet in the evening  --DECREASE your Lasix to 20 mg every other day     Call if you have questions about your medications.    LABS:  We will call you if your labs need attention.    NEXT APPOINTMENT:  Return to clinic in 2 weeks with Irving Burton or Maralyn Sago      In general, to take care of your heart failure:  -Limit your fluid intake to 2 Liters (half-gallon) per day.    -Limit your salt intake to ideally 2-3 grams (2000-3000 mg) per day.  -Weigh yourself daily and record, and bring that weight diary to your next appointment.  (Weight gain of 2-3 pounds in 1 day typically means fluid weight.)    The medications for your heart are to help your heart and help you live longer.    Please contact us before stopping any of your heart medications.    Call the clinic at [phone] with questions.  Our clinic fax number is [phone].  If you need to reschedule future appointments, please call [phone] or [phone]  My office number (c/o De Burrs RN) is [phone] if you need further assistance.  After office hours, if you have urgent questions/problems, contact the on-call cardiologist through the hospital operator: [phone].    To learn more about heart failure, please read [place]'s Learning to Live with Heart Failure:  [email or web address].    Available online at   [email or web address] - then click the link Learning to Live with Heart Failure patient booklet    OR    [email or web address] - open the window for Medical Management and click the link Living with Heart Failure

## 2020-08-15 NOTE — Unmapped (Signed)
I have reviewed and agree with the above battery, threshold, lead impedance, and overall device/lead status, diagnostic data including arrhythmic events and therapies on the device evaluation.    Lamine Laton, MD

## 2020-08-19 DIAGNOSIS — I255 Ischemic cardiomyopathy: Principal | ICD-10-CM

## 2020-08-19 DIAGNOSIS — I5022 Chronic systolic (congestive) heart failure: Principal | ICD-10-CM

## 2020-08-19 MED ORDER — SACUBITRIL 24 MG-VALSARTAN 26 MG TABLET
ORAL_TABLET | ORAL | 2 refills | 32 days | Status: CP
Start: 2020-08-19 — End: 2020-11-17

## 2020-08-19 NOTE — Unmapped (Signed)
Patient called to report need for new RX and clarification of dose: Entresto    Patient's Rx was sent to [facility] , however, the foundation  which is filled by [facility]  Is where the RX should be sent.   Clarification: new RX order 1  In AM and 1 1/2  PM.  Patient was concerned because he was alerted  manufacturer did not recommend cutting in half . Please advise.     [facility].    Please make a notation that 1/2 tablet is acceptable or there will be a delay in filling RX because they require acknowledgement fro Prescriber.      Patient is low on medication and this is a priority.

## 2020-08-20 NOTE — Unmapped (Signed)
Pt called and left VM stating that his RX request from yesterday had not been resolved yet.     [facility] and they verified that they received the rx yesterday and will proceed with filling med.     Called pt to make him aware of situation. He verbalized understanding.

## 2020-08-20 NOTE — Unmapped (Signed)
Pt requesting someone to call [employer] [phone]) about getting his medication.     Called [employer] to clarify the prescription.     Spoke with [employer] at [facility]. She informed me that she placed a STAT order for the rx to be processed.     Called pt and informed him of conversations. He verbalized understanding.

## 2020-08-26 ENCOUNTER — Encounter: Admit: 2020-08-26 | Discharge: 2020-08-27 | Payer: MEDICARE | Attending: Pharmacist | Primary: Pharmacist

## 2020-08-26 DIAGNOSIS — I214 Non-ST elevation (NSTEMI) myocardial infarction: Principal | ICD-10-CM

## 2020-08-26 MED ORDER — CARVEDILOL 12.5 MG TABLET
ORAL_TABLET | 2 refills | 0.00000 days
Start: 2020-08-26 — End: ?

## 2020-08-26 NOTE — Unmapped (Signed)
[facility] Clinical Assessment & Refill Coordination Note    Douglas Beltran, DOB: December 26, 1940  Phone: There are no phone numbers on file.    All above HIPAA information was verified with patient.     Was a Nurse, learning disability used for this call? No    Specialty Medication(s):   General Specialty: Repatha     Current Outpatient Medications   Medication Sig Dispense Refill   ??? acetaminophen (TYLENOL) 500 MG tablet Take 500 mg by mouth every six (6) hours as needed for pain.     ??? aspirin (ECOTRIN) 81 MG tablet Take 81 mg by mouth daily.      ??? atorvastatin (LIPITOR) 80 MG tablet Take 1 tablet (80 mg total) by mouth daily. 90 tablet 3   ??? calcium polycarbophiL (FIBERCON) 625 mg tablet Take 1,250 mg by mouth nightly.     ??? carvediloL (COREG) 12.5 MG tablet Take 1 tablet (12.5 mg total) by mouth Two (2) times a day. 180 tablet 3   ??? clopidogreL (PLAVIX) 75 mg tablet Take 1 tablet (75 mg total) by mouth daily. 90 tablet 3   ??? empagliflozin (JARDIANCE) 10 mg tablet Take 1 tablet (10 mg total) by mouth daily. 30 tablet 11   ??? empty container (SHARPS-A-GATOR DISPOSAL SYSTEM) Misc Use as directed for sharps disposal 1 each 2   ??? evolocumab 140 mg/mL PnIj Inject the contents of 1 pen (140 mg) under the skin every fourteen (14) days. 4 mL 11   ??? furosemide (LASIX) 20 MG tablet Take 1 tablet (20 mg total) by mouth every other day. 90 tablet 3   ??? metFORMIN (GLUCOPHAGE) 1000 MG tablet Take 1,000 mg by mouth 2 (two) times a day with meals.      ??? sacubitriL-valsartan (ENTRESTO) 24-26 mg tablet Take 1 tablet by mouth daily AND 1.5 tablets every evening. 80 tablet 2   ??? sertraline (ZOLOFT) 50 MG tablet Take 50 mg by mouth daily.       No current facility-administered medications for this visit.        Changes to medications: Lewi reports no changes at this time.    Allergies   Allergen Reactions   ??? Iodine Other (See Comments) and Hives       Changes to allergies: No    SPECIALTY MEDICATION ADHERENCE Repatha 140 mg/ml: 0 days of medicine on hand       Medication Adherence    Patient reported X missed doses in the last month: 0  Specialty Medication: Repatha 140 mg/mL  Informant: patient          Specialty medication(s) dose(s) confirmed: Regimen is correct and unchanged.     Are there any concerns with adherence? No    Adherence counseling provided? Not needed    CLINICAL MANAGEMENT AND INTERVENTION      Clinical Benefit Assessment:    Do you feel the medicine is effective or helping your condition? Yes    Clinical Benefit counseling provided? Will have lipid labs done with PCP on Monday, 09/01/20. Pt is excited to see if there has been any improvement    Adverse Effects Assessment:    Are you experiencing any side effects? No    Are you experiencing difficulty administering your medicine? No. Pt reports he pulled needle out too soon with second dose and some of the medicine leaked out. He has not had any difficulties since then.    Quality of Life Assessment:  How many days over the past month did your high cholesterol  keep you from your normal activities? For example, brushing your teeth or getting up in the morning. 0    Have you discussed this with your provider? Not needed    Therapy Appropriateness:    Is therapy appropriate? Yes, therapy is appropriate and should be continued    DISEASE/MEDICATION-SPECIFIC INFORMATION      For patients on injectable medications: Patient currently has 0 doses left.  Next injection is scheduled for 09/06/20.    PATIENT SPECIFIC NEEDS     - Does the patient have any physical, cognitive, or cultural barriers? No    - Is the patient high risk? No    - Does the patient require a Care Management Plan? No     - Does the patient require physician intervention or other additional services (i.e. nutrition, smoking cessation, social work)? No      SHIPPING     Specialty Medication(s) to be Shipped:   General Specialty: Repatha    Other medication(s) to be shipped: No additional medications requested for fill at this time     Changes to insurance: No. Pt is in medicare coverage gap. Will test claim for price prior to shipping    Delivery Scheduled: Yes, Expected medication delivery date: 09/02/20.     Medication will be delivered via Same Day Courier to the confirmed prescription address in [facility].    The patient will receive a drug information handout for each medication shipped and additional FDA Medication Guides as required.  Verified that patient has previously received a Conservation officer, historic buildings.    All of the patient's questions and concerns have been addressed.    Douglas Beltran   [facility] Specialty Pharmacist

## 2020-08-26 NOTE — Unmapped (Incomplete)
[facility] CARDIOLOGY CLINIC  [address]  [place], [place] [address]  PHONE: [phone]  FAX: [phone]           CLINICAL PHARMACIST PRACTITIONER NOTE   HEART FAILURE    Referring Provider: Dr. Fernande Bras  Cardiologist: Dr. Fernande Bras and Dr. Rivka Spring  Primary Care Provider: Marguarite Arbour, MD    Assessment and Plan:     #HFrEF (LVEF 15-20%) with recent BiV ICD placement on 07/29/20.  During his last visit with Dr. Prescilla Sours was increased and furosemide dose was reduced.    ?? Increase carvedilol to 25 mg in the morning and 12.5 mg in the evening. This dose was chosen to avoid tablet   ?? Continue {JDCHFTHERAPY:69325}  ?? May consider starting spironolactone and/or titrating sacubitril/valsartan Sherryll Burger) and carvedilol at a future visit as appropriate  ?? Monitor and log weights, blood pressure, and heart rate  ?? Avoid NSAIDs  ?? Last ECHO July 2021    #CAD - initially diagnosed with multivessel disease in 2013 with subsequent PCI (rather than CABG per patient decision) and NSTEMI with PCI to proximal LAD and mid RCA in May 2021.  Has taken 4 doses of the evolocumab.  ?? Key risk factors include: CAD, Diabetes (hemoglobin A1c on 05/27/20 6.7%) and Lipids (LDL 81 mg/dL, triglycerides 83 mg/dL) last checked on 4/69/62 per Care Everywhere  ?? {JDCSTARTSTOP:69427::Start} {JDCASCVDTHERAPY:69327}  ?? Continue {JDCASCVDTHERAPY:69327}   ??     #Diabetes - HbA1c 6.7% on 05/27/20 (Care Everywhere)  ?? ***  ?? {JDCSTARTSTOP:69427::Start} ***    #CKD  ??     #Medication Adherence & Access:   ??? {ZMDMEDMANAGEMENT:22036}   ??? Medications reviewed in EPIC medication station and updated today by the clinical pharmacist practitioner.  ??? Manufacturer's assistance for: Entresto    #FOLLOW UP:   ??? {JDCRETURN:69292}  ??? {JDCRETURN:69292} - Jardiance $47 now $146   ??? Lipid panel and BMP to be checked at PCP office   ??? Next cardiologist visit with Dr. Wonda Cheng on 10/31/20 and Dr. Herbert Deaner on 11/18/20  ??? Next PCP visit is in December 2021      I spent a total of {NUMBERS; 0-45 BY 5:10291} minutes on the phone with the patient delivering clinical care and providing education/counseling.           Nils Flack, PharmD, BCPS, [facility], CPP  Cardiology Clinical Pharmacist Practitioner  [facility]  [address]  [place], [place] [address]  Phone: [phone]  Fax: [phone]      History of Present Illness:   Douglas Beltran is a 79 y.o. year old male with a history of *** who presents today for {JDCCONSIDER:69440::consideration of} {JDCHFMEDSTART:69441} at the request of Dr. Carin Hock.     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      Summary of Recent Visits and Key Medication Changes  ??? 08/04/20: BMP included SCr 1.54 mg/dL and K 4.8 mmol/L  ??? 06/15/24: Entresto 24-26 mg was increased to 1 tablet in the morning and 1.5 tablets in the afternoon; furosemide was decreased to 20 mg every other day, repeat labs recommended in 1 week (at PCP office)  to consider spironolactone start      His current heart failure regimen includes sacubitril/valsartan Sherryll Burger) ***, carvedilol *** mg BID and empagliflozin (Jardiance) *** mg daily      Interval History  He reports no active symptoms of heart failure. No dyspnea, orthopnea, paroxysmal nocturnal  dyspnea, or LE edema. No palpitations, chest pain, pre-syncope or syncope. He reports a good appetite without bloating, abdominal distension or early satiety. He reports laying flat at night without difficulty.    He reports lightheadedness with position changes, resolves with slowing down, he denies syncope.  He currently goes to the gym Monday - Friday and walks at the mall on the weekend for ~ 30 minutes.    Blood pressure self-monitoring  He reports he checks his BP daily at home 110-115's/70's mm Hg with heart rate 68-89 bpm.    Weight self-monitoring  He reports weighing himself daily 207-208 lbs and typically less than 210 lbs.      Cardiovascular History, Studies and Procedures:    Cath / PCI:  04/15/20 Left heart catheterization  1. PCI was performed of the??99%??heavily calcificed Left Main and??LAD. Rotational atherectomy was performed using a 1.75 mm burr. PCI was performed with a 4.0x38 Xience (LM-LAD).??  2. PCI was performed of the??90%??heavily calcificed??RCA. Rotational atherectomy was performed using a 1.75 mm burr. PCI was performed with a 4.0x38 and 3.5x30 with rota (post to 4.5).   3. Excellent??angiographic result with TIMI 3??flow    CV Surgery:     EP Procedures and Devices:     Non-Invasive Evaluation(s):   Echo:   06/18/20 ECHO Summary    1. Limited study to assess ventricular function.    2. The left ventricle is mildly dilated in size with mildly increased wall  thickness.    3. The left ventricular systolic function is severely decreased, LVEF is  visually estimated at 15-20%.    4. The inferior wall, basal inferolateral wall, and mid inferolateral wall  are akinetic.    5. There is grade II diastolic dysfunction (elevated filling pressure).    6. The left atrium is mildly dilated in size.    7. The aortic valve is probably trileaflet with mildly thickened leaflets  with normal excursion.    8. The right ventricle is mildly dilated in size, with normal systolic  Function.    04/11/20 ECHO Summary    1. The left ventricle is moderately dilated in size with mildly increased  wall thickness.    2. The left ventricular systolic function is severely decreased, LVEF is  visually estimated at 20-25%.    3. There is grade II diastolic dysfunction (elevated filling pressure).    4. The mitral valve leaflets are mildly thickened with normal leaflet  mobility.    5. There is mild to moderate mitral valve regurgitation.    6. The left atrium is severely dilated in size.    7. The aortic valve is trileaflet with mildly thickened leaflets with normal  excursion.    8. The right ventricle is mildly dilated in size, with low normal systolic  function.    9. The right atrium is moderately dilated  in size.    10. IVC size and inspiratory change suggest mildly elevated right atrial  pressure. (5-10 mmHg).    11. There is a trivial, anterior pericardial effusion.    Cardiac CT/MRI/Nuclear Tests:     Cardiopulmonary Stress Tests:         Past Medical & Surgical History  Past Medical History:   Diagnosis Date   ??? CHF (congestive heart failure) (CMS-HCC)    ??? Coronary artery disease    ??? Diabetes mellitus (CMS-HCC)    ??? Hypertension 07/12/2014   ??? Macrocytic anemia    ??? Mixed hyperlipidemia 07/12/2014  Past Surgical History:   Procedure Laterality Date   ??? ABDOMINAL SURGERY     ??? EYE SURGERY     ??? JOINT REPLACEMENT     ??? PR CATH PLACE/CORON ANGIO, IMG SUPER/INTERP,W LEFT HEART VENTRICULOGRAPHY N/A 04/15/2020    Procedure: CATH LEFT HEART CATHETERIZATION W INTERVENTION;  Surgeon: Rosana Hoes, MD;  Location: [facility] CATH;  Service: Cardiology   ??? PR INSJ/RPLCMT PERM DFB W/TRNSVNS LDS 1/DUAL CHMBR N/A 08/04/2020    Procedure: Implant Biventricular ICD System;  Surgeon: Dorcas Carrow, MD;  Location: [facility] EP;  Service: Cardiology        Social & Family History  He  reports that he has quit smoking. He has never used smokeless tobacco.     Family History   Problem Relation Age of Onset   ??? Heart disease Father    ??? Diabetes Father    ??? Heart disease Brother         Medications:  Reviewed and updated. Medication list includes revisions made during today's encounter.    Current Outpatient Medications   Medication Sig Dispense Refill   ??? acetaminophen (TYLENOL) 500 MG tablet Take 500 mg by mouth every six (6) hours as needed for pain.     ??? aspirin (ECOTRIN) 81 MG tablet Take 81 mg by mouth daily.      ??? atorvastatin (LIPITOR) 80 MG tablet Take 1 tablet (80 mg total) by mouth daily. 90 tablet 3   ??? calcium polycarbophiL (FIBERCON) 625 mg tablet Take 1,250 mg by mouth nightly.     ??? carvediloL (COREG) 12.5 MG tablet Take 1 tablet (12.5 mg total) by mouth Two (2) times a day. 180 tablet 3 ??? clopidogreL (PLAVIX) 75 mg tablet Take 1 tablet (75 mg total) by mouth daily. 90 tablet 3   ??? empagliflozin (JARDIANCE) 10 mg tablet Take 1 tablet (10 mg total) by mouth daily. 30 tablet 11   ??? empty container (SHARPS-A-GATOR DISPOSAL SYSTEM) Misc Use as directed for sharps disposal 1 each 2   ??? evolocumab 140 mg/mL PnIj Inject the contents of 1 pen (140 mg) under the skin every fourteen (14) days. 4 mL 11   ??? furosemide (LASIX) 20 MG tablet Take 1 tablet (20 mg total) by mouth every other day. 90 tablet 3   ??? metFORMIN (GLUCOPHAGE) 1000 MG tablet Take 1,000 mg by mouth.     ??? sacubitriL-valsartan (ENTRESTO) 24-26 mg tablet Take 1 tablet by mouth daily AND 1.5 tablets every evening. 80 tablet 2   ??? sertraline (ZOLOFT) 50 MG tablet Take 50 mg by mouth daily.       No current facility-administered medications for this visit.       Allergies/Adverse Events:  Allergies   Allergen Reactions   ??? Iodine Other (See Comments) and Hives          Lab Results  Lab Results   Component Value Date    PRO-BNP 4,040.0 (H) 04/23/2020    PRO-BNP 4,710.0 (H) 04/10/2020    Creatinine 1.54 (H) 08/04/2020    Creatinine 1.64 (H) 04/25/2020    Creatinine 1.33 (H) 01/19/2012    Creatinine 1.19 01/18/2012    BUN 30 (H) 08/04/2020    BUN 46 (H) 04/25/2020    BUN 33 (H) 01/19/2012    BUN 29 (H) 01/18/2012    Potassium 4.8 (H) 08/04/2020    Potassium 4.6 04/25/2020    Potassium 4.5 01/19/2012    Potassium 4.7 01/18/2012    Magnesium 2.8 (  H) 04/23/2020    Magnesium 2.5 (H) 04/16/2020    Magnesium 2.2 01/18/2012    AST 33 04/25/2020    AST 33 04/10/2020    AST 28 01/18/2012    ALT 31 04/25/2020    ALT 42 04/10/2020    ALT 29 01/18/2012    Total Bilirubin 0.9 04/25/2020    Total Bilirubin 0.5 04/10/2020    Total Bilirubin 0.5 01/18/2012    INR 1.08 04/10/2020    INR 1.0 01/18/2012    WBC 5.4 08/04/2020    WBC 10.6 01/19/2012    HGB 14.5 08/04/2020    HGB 12.6 (L) 01/19/2012    HCT 42.8 08/04/2020    HCT 36.8 (L) 01/19/2012    Platelet 169 08/04/2020    Platelet 141 (L) 01/19/2012    Cholesterol, Total 189 05/17/2012    Triglycerides 215 (H) 05/17/2012    HDL 34 (L) 05/17/2012    Non HDL Chol.  155 05/17/2012    LDL Cholesterol, Calculated 112 05/17/2012         Vital Signs:  There were no vitals filed for this visit.    BP Readings from Last 3 Encounters:   08/15/20 110/80   08/04/20 131/74   05/28/20 124/69     Pulse Readings from Last 3 Encounters:   08/15/20 59   08/04/20 75   05/28/20 66     Wt Readings from Last 3 Encounters:   08/15/20 96.9 kg (213 lb 9.6 oz)   08/04/20 94 kg (207 lb 4.8 oz)   05/28/20 97.2 kg (214 lb 3.2 oz)      BMI Readings from Last 3 Encounters:   08/15/20 26.70 kg/m??   08/04/20 25.91 kg/m??   05/28/20 27.50 kg/m??

## 2020-08-26 NOTE — Unmapped (Signed)
[facility] CARDIOLOGY CLINIC  [address]  [place], [place] [address]  PHONE: [phone]  FAX: [phone]           CLINICAL PHARMACIST PRACTITIONER NOTE   HEART FAILURE    Referring Provider: Dr. Fernande Bras  Cardiologist: Dr. Fernande Bras and Dr. Rivka Spring  Primary Care Provider: Marguarite Arbour, MD    Assessment and Plan:     #HFrEF (LVEF 15-20%) with recent BiV ICD placement on 07/29/20.  He was recently seen by Dr. Wonda Cheng on 08/15/20 and Sherryll Burger was increased and furosemide was decreased.  Today he reports NYHA class I-II sympomts.  The next step for optimization as noted by Dr. Wonda Cheng would be addition of spironolactone.  As we are awaiting repeat BMP I will not make that change or increase Entresto further today; we will consider those changes if labs are amenable, at next visit. Douglas Beltran is amenable to optimization of his carvedilol dose so we will pursue that change today.  During discussion today Douglas Beltran notes his [place] will be >$140 per month so we discussed pursuing manufacturer assistance for this.  I suspect he will be a candidate for this as he also receives manufacturer assistance for [employer].  Douglas Beltran verbalized understanding of the plan.  He was given the ability to ask questions all of which were answered.  ?? Increase carvedilol to 25 mg in the morning and 12.5 mg in the evening.  This dose chosen to avoid tablet splitting per patient request  ?? Prescription sent to [facility] to pursue manufacturer assistance of Jardiance  ?? Continue sacubitril/valsartan (Entresto) 24/26 mg - 1 tablet in the morning and 1.5 tablets in the evening, empagliflozin (Jardiance) 10 mg daily and furosemide 20 mg every other day  ?? May consider titrating sacubitril/valsartan Sherryll Burger) and carvedilol and/or starting spironolactone at a future visit as appropriate pending upcoming lab results  ?? Monitor and log weights, blood pressure, and heart rate  ?? Avoid NSAIDs   ?? Last ECHO July 2021    #CAD - initially diagnosed with multivessel disease in 2013 with subsequent PCI (rather than CABG per patient decision)  NSTEMI with PCI to proximal LAD and mid RCA in May 2021  ?? Key risk factors include: CAD, Diabetes (hemoglobin A1c on 05/27/20 6.7%) and Lipids (LDL 81 mg/dL, triglycerides 83 mg/dL) last checked on 2/95/28 per Care Everywhere  ?? Continue aspirin 81 mg daily, clopidogrel 75 mg daily, atorvastatin 80 mg daily and evolocumab (Repatha) 140 mg every 2 weeks   ?? Per patient repeat lipid panel will be collected next week, will assess need for optimization at next visit    #Diabetes - HbA1c 6.7% on 05/27/20 (Care Everywhere)  ?? Managed by PCP  ?? Continue empagliflozin 10 mg daily and metformin 1000 mg BID    #Medication Adherence & Access:   ??? Reviewed the indication, dose, and frequency of each medication with patient   ??? Medications reviewed in EPIC medication station and updated today by the clinical pharmacist practitioner.  ??? Manufacturer assistance obtained for: Entresto  ??? Manufacturer's assistance being pursued for: Jardiance    #FOLLOW UP:   ??? repeat pharmacist phone visit on 09/10/20 at 2 pm   ??? Next cardiologist visit with Dr. Wonda Cheng on 10/31/20 and Dr. Herbert Deaner on 11/18/20  ??? Next PCP visit is in December 2021      I spent a total of 40 minutes on the phone with the patient delivering clinical care and providing  education/counseling.           Nils Flack, PharmD, BCPS, [facility], CPP  Cardiology Clinical Pharmacist Practitioner  [facility]  [address]  [place], [place] [address]  Phone: [phone]  Fax: [phone]      History of Present Illness:   Douglas Beltran is a 79 y.o. year old male with a history of HFrEF, CKD, and diabetes who presents today for consideration of HF medication optimization at the request of Dr. Carin Hock.   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      Summary of Recent Visits and Key Medication Changes  ??? 08/04/20: BMP included SCr 1.54 mg/dL and K 4.8 mmol/L  ??? 12/15/06: Entresto 24-26 mg was increased to 1 tablet in the morning and 1.5 tablets in the afternoon; furosemide was decreased to 20 mg every other day, repeat labs recommended in 1 week (at PCP office)  to consider spironolactone start      His current heart failure regimen includes sacubitril/valsartan (Entresto) 24/26 mg - 1 tablet in the morning and 1.5 tablets in the evening, carvedilol 12.5 mg BID and empagliflozin (Jardiance) 10 mg daily    Interval History  He reports no active symptoms of heart failure. No dyspnea, orthopnea, paroxysmal nocturnal dyspnea, or LE edema. No palpitations, chest pain, dizziness, pre-syncope or syncope. He reports a good appetite without bloating, abd distension or early satiety. He reports laying flat at night without difficulty.    He reports regularly attending the gym and walking in the mall on weekends for ~30 min.    Blood pressure self-monitoring  He reports SBP 110s mm Hg with heart rate 68-80 bpm.      Weight self-monitoring  He reports daily weights ~207 lbs.      Cardiovascular History, Studies and Procedures:    Cath / PCI:  04/15/20 Left heart catheterization  1. PCI was performed of the??99%??heavily calcificed Left Main and??LAD. Rotational atherectomy was performed using a 1.75 mm burr. PCI was performed with a 4.0x38 Xience (LM-LAD).??  2. PCI was performed of the??90%??heavily calcificed??RCA. Rotational atherectomy was performed using a 1.75 mm burr. PCI was performed with a 4.0x38 and 3.5x30 with rota (post to 4.5).   3. Excellent??angiographic result with TIMI 3??flow    CV Surgery:     EP Procedures and Devices:     Non-Invasive Evaluation(s):   Echo:   06/18/20 ECHO Summary    1. Limited study to assess ventricular function.    2. The left ventricle is mildly dilated in size with mildly increased wall  thickness.    3. The left ventricular systolic function is severely decreased, LVEF is  visually estimated at 15-20%.    4. The inferior wall, basal inferolateral wall, and mid inferolateral wall  are akinetic.    5. There is grade II diastolic dysfunction (elevated filling pressure).    6. The left atrium is mildly dilated in size.    7. The aortic valve is probably trileaflet with mildly thickened leaflets  with normal excursion.    8. The right ventricle is mildly dilated in size, with normal systolic  Function.    04/11/20 ECHO Summary    1. The left ventricle is moderately dilated in size with mildly increased  wall thickness.    2. The left ventricular systolic function is severely decreased, LVEF is  visually estimated at 20-25%.    3. There is grade II diastolic dysfunction (elevated filling pressure).  4. The mitral valve leaflets are mildly thickened with normal leaflet  mobility.    5. There is mild to moderate mitral valve regurgitation.    6. The left atrium is severely dilated in size.    7. The aortic valve is trileaflet with mildly thickened leaflets with normal  excursion.    8. The right ventricle is mildly dilated in size, with low normal systolic  function.    9. The right atrium is moderately dilated  in size.    10. IVC size and inspiratory change suggest mildly elevated right atrial  pressure. (5-10 mmHg).    11. There is a trivial, anterior pericardial effusion.    Cardiac CT/MRI/Nuclear Tests:     Cardiopulmonary Stress Tests:         Past Medical & Surgical History  Past Medical History:   Diagnosis Date   ??? CHF (congestive heart failure) (CMS-HCC)    ??? Coronary artery disease    ??? Diabetes mellitus (CMS-HCC)    ??? Hypertension 07/12/2014   ??? Macrocytic anemia    ??? Mixed hyperlipidemia 07/12/2014        Past Surgical History:   Procedure Laterality Date   ??? ABDOMINAL SURGERY     ??? EYE SURGERY     ??? JOINT REPLACEMENT     ??? PR CATH PLACE/CORON ANGIO, IMG SUPER/INTERP,W LEFT HEART VENTRICULOGRAPHY N/A 04/15/2020    Procedure: CATH LEFT HEART CATHETERIZATION W INTERVENTION;  Surgeon: Rosana Hoes, MD;  Location: [facility] CATH;  Service: Cardiology   ??? PR INSJ/RPLCMT PERM DFB W/TRNSVNS LDS 1/DUAL CHMBR N/A 08/04/2020    Procedure: Implant Biventricular ICD System;  Surgeon: Dorcas Carrow, MD;  Location: [facility] EP;  Service: Cardiology        Social & Family History  He  reports that he has quit smoking. He has never used smokeless tobacco.     Family History   Problem Relation Age of Onset   ??? Heart disease Father    ??? Diabetes Father    ??? Heart disease Brother         Medications:  Reviewed and updated. Medication list includes revisions made during today's encounter.    Current Outpatient Medications   Medication Sig Dispense Refill   ??? acetaminophen (TYLENOL) 500 MG tablet Take 500 mg by mouth every six (6) hours as needed for pain.     ??? aspirin (ECOTRIN) 81 MG tablet Take 81 mg by mouth daily.      ??? atorvastatin (LIPITOR) 80 MG tablet Take 1 tablet (80 mg total) by mouth daily. 90 tablet 3   ??? calcium polycarbophiL (FIBERCON) 625 mg tablet Take 1,250 mg by mouth nightly.     ??? carvediloL (COREG) 12.5 MG tablet Take 1 tablet (12.5 mg total) by mouth Two (2) times a day. 180 tablet 3   ??? clopidogreL (PLAVIX) 75 mg tablet Take 1 tablet (75 mg total) by mouth daily. 90 tablet 3   ??? empagliflozin (JARDIANCE) 10 mg tablet Take 1 tablet (10 mg total) by mouth daily. 30 tablet 11   ??? empty container (SHARPS-A-GATOR DISPOSAL SYSTEM) Misc Use as directed for sharps disposal 1 each 2   ??? evolocumab 140 mg/mL PnIj Inject the contents of 1 pen (140 mg) under the skin every fourteen (14) days. 4 mL 11   ??? furosemide (LASIX) 20 MG tablet Take 1 tablet (20 mg total) by mouth every other day. 90 tablet 3   ??? metFORMIN (GLUCOPHAGE) 1000 MG tablet  Take 1,000 mg by mouth.     ??? sacubitriL-valsartan (ENTRESTO) 24-26 mg tablet Take 1 tablet by mouth daily AND 1.5 tablets every evening. 80 tablet 2   ??? sertraline (ZOLOFT) 50 MG tablet Take 50 mg by mouth daily.       No current facility-administered medications for this visit.       Allergies/Adverse Events:  Allergies   Allergen Reactions   ??? Iodine Other (See Comments) and Hives          Lab Results  Lab Results   Component Value Date    PRO-BNP 4,040.0 (H) 04/23/2020    PRO-BNP 4,710.0 (H) 04/10/2020    Creatinine 1.54 (H) 08/04/2020    Creatinine 1.64 (H) 04/25/2020    Creatinine 1.33 (H) 01/19/2012    Creatinine 1.19 01/18/2012    BUN 30 (H) 08/04/2020    BUN 46 (H) 04/25/2020    BUN 33 (H) 01/19/2012    BUN 29 (H) 01/18/2012    Potassium 4.8 (H) 08/04/2020    Potassium 4.6 04/25/2020    Potassium 4.5 01/19/2012    Potassium 4.7 01/18/2012    Magnesium 2.8 (H) 04/23/2020    Magnesium 2.5 (H) 04/16/2020    Magnesium 2.2 01/18/2012    AST 33 04/25/2020    AST 33 04/10/2020    AST 28 01/18/2012    ALT 31 04/25/2020    ALT 42 04/10/2020    ALT 29 01/18/2012    Total Bilirubin 0.9 04/25/2020    Total Bilirubin 0.5 04/10/2020    Total Bilirubin 0.5 01/18/2012    INR 1.08 04/10/2020    INR 1.0 01/18/2012    WBC 5.4 08/04/2020    WBC 10.6 01/19/2012    HGB 14.5 08/04/2020    HGB 12.6 (L) 01/19/2012    HCT 42.8 08/04/2020    HCT 36.8 (L) 01/19/2012    Platelet 169 08/04/2020    Platelet 141 (L) 01/19/2012    Cholesterol, Total 189 05/17/2012    Triglycerides 215 (H) 05/17/2012    HDL 34 (L) 05/17/2012    Non HDL Chol.  155 05/17/2012    LDL Cholesterol, Calculated 112 05/17/2012         Vital Signs:  There were no vitals filed for this visit.    BP Readings from Last 3 Encounters:   08/15/20 110/80   08/04/20 131/74   05/28/20 124/69     Pulse Readings from Last 3 Encounters:   08/15/20 59   08/04/20 75   05/28/20 66     Wt Readings from Last 3 Encounters:   08/15/20 96.9 kg (213 lb 9.6 oz)   08/04/20 94 kg (207 lb 4.8 oz)   05/28/20 97.2 kg (214 lb 3.2 oz)      BMI Readings from Last 3 Encounters:   08/15/20 26.70 kg/m??   08/04/20 25.91 kg/m??   05/28/20 27.50 kg/m??

## 2020-08-27 MED ORDER — EMPAGLIFLOZIN 10 MG TABLET
ORAL_TABLET | Freq: Every day | ORAL | 11 refills | 30.00000 days | Status: CP
Start: 2020-08-27 — End: ?

## 2020-08-28 NOTE — Unmapped (Signed)
I was the supervising physician in the delivery of the service. Carin Hock, MD

## 2020-09-01 DIAGNOSIS — I214 Non-ST elevation (NSTEMI) myocardial infarction: Principal | ICD-10-CM

## 2020-09-02 MED FILL — REPATHA SURECLICK 140 MG/ML SUBCUTANEOUS PEN INJECTOR: 56 days supply | Qty: 4 | Fill #1 | Status: AC

## 2020-09-02 MED FILL — REPATHA SURECLICK 140 MG/ML SUBCUTANEOUS PEN INJECTOR: SUBCUTANEOUS | 56 days supply | Qty: 4 | Fill #1

## 2020-09-04 MED ORDER — AMOXICILLIN 500 MG CAPSULE
0 days
Start: 2020-09-04 — End: ?

## 2020-09-08 ENCOUNTER — Institutional Professional Consult (permissible substitution): Admit: 2020-09-08 | Discharge: 2020-09-09 | Payer: MEDICARE

## 2020-09-08 NOTE — Unmapped (Signed)
ema °

## 2020-09-09 NOTE — Unmapped (Signed)
[facility] Heart Failure Monitoring Device Report    Patient:  Douglas Beltran  Date:  September 09, 2020    Device:   Medtronic - [place] HF cardiologist:   Dr. Johann Capers EP cardiologist:   [facility]   Other cardiologist:  none     PCP:  JEFFREY Rodena Medin, MD      30 day Report:  There were no readings that exceeded the thresholds.    Optivol device detected intrathoracic impedance that was at baseline, suggesting optimal fluid status.        Please see full interrogation report under Media tab for this encounter.     I reviewed the device-generated report and agree with findings.      Recommendation:  New device; continue to establish trends.    Continue monthly monitoring.      Hope Budds, NP

## 2020-09-10 ENCOUNTER — Institutional Professional Consult (permissible substitution): Admit: 2020-09-10 | Discharge: 2020-09-11 | Payer: MEDICARE | Attending: Pharmacist | Primary: Pharmacist

## 2020-09-10 DIAGNOSIS — I255 Ischemic cardiomyopathy: Principal | ICD-10-CM

## 2020-09-10 DIAGNOSIS — I429 Cardiomyopathy, unspecified: Principal | ICD-10-CM

## 2020-09-10 MED ORDER — FUROSEMIDE 20 MG TABLET
ORAL_TABLET | 3 refills | 0 days
Start: 2020-09-10 — End: ?

## 2020-09-10 MED ORDER — CARVEDILOL 12.5 MG TABLET
ORAL_TABLET | ORAL | 2 refills | 0.00000 days | Status: CP
Start: 2020-09-10 — End: ?

## 2020-09-10 MED ORDER — SPIRONOLACTONE 25 MG TABLET
ORAL_TABLET | Freq: Every evening | ORAL | 6 refills | 30 days | Status: CP
Start: 2020-09-10 — End: ?

## 2020-09-10 NOTE — Unmapped (Signed)
[facility]  [address]  [place], [place] [address]  PHONE: [phone]  FAX: [phone]           CLINICAL PHARMACIST PRACTITIONER NOTE   HEART FAILURE    Referring Provider: Dr. Fernande Bras  Cardiologist: Dr. Fernande Bras and Dr. Rivka Spring  Primary Care Provider: Marguarite Arbour, MD    Assessment and Plan:     #HFrEF (LVEF 15-20%) with recent BiV ICD placement on 07/29/20.  Today he reports NYHA class I-II symptoms. At his last visit, his carvedilol was increased from 12.5 mg BID to carvedilol 25 mg in the morning and 12.5 mg in the evening. On 08/15/20 during his visit with Dr. Wonda Cheng, his Sherryll Burger 24/26 mg was increased to 1 tablet in the morning and 1.5 tablet in the evening and his lasix was decreased to every other day. Since these medication changes, he reports increased dizziness and two incidents of falls shortly after carvedilol dose increase, that fortunately did not result in injury and his carvedilol dose increase was maintained. He has voiced that he has begun to take his time moving around when the dizziness symptoms begin. He has voiced his desire to continue to implement medication changes to further optimize his therapy. We discussed his recent fall history in detail and asked about his daily fluid intake. He reports taking less than 2 L daily. We discussed the likelihood that he could be dry considering his low fluid intake and his exercise routine. We made a shared decision to decrease his furosemide 20 mg every other day to as needed. We instructed him to continue to weigh himself and to take lasix when he notices his weight climb more than 2 lbs of his baseline (207 lbs) in a day or more than 5 lbs in a week.  He voiced understanding of this plan. Additionally, we reviewed his recent BMP 09/01/20 BMP ([facility]) included K 4.6 mmol/L and SCr 1.3 mg/dL. Given his stable potassium we discussed the addition of low-dose spironolactone today to which he was amenable. We discussed the importance of monitoring potassium with labs as his potassium will be raised with initiation of spironolactone and decrease of furosemide. He agreed to get a BMP on 09/18/20. Lastly, we inquired about the status of his manufacturer assistance application for his jardiance as he is currently in a coverage gap and it currently costs him ~$140. He notified us that he received the application and will be mailing it today. We reviewed today's plan with Douglas Beltran and he confirmed understanding of the plan.  He was given the ability to ask questions all of which were answered.  ?? Decrease furosemide from 20 mg every other day to 20 mg only if needed.   ?? Start spironolactone 12.5 mg in the evening.   ?? Prescription previously sent to [facility] to pursue manufacturer assistance of [place] Pepper  He has received packet and will mail back to [facility] tomorrow.  ?? Continue sacubitril/valsartan (Entresto) 24/26 mg - 1 tablet in the morning and 1.5 tablets in the evening, carvedilol 25 mg in the morning and 12.5 mg in the evening and empagliflozin (Jardiance) 10 mg daily  ?? May consider titrating sacubitril/valsartan Sherryll Burger) and/or carvedilol at a future visit as appropriate pending upcoming lab results  ?? Monitor and log weights, blood pressure, and heart rate  ?? Avoid NSAIDs   ?? Last ECHO July 2021    #CAD - initially diagnosed with multivessel disease in 2013 with  subsequent PCI (rather than CABG per patient decision)  NSTEMI with PCI to proximal LAD and mid RCA in May 2021. Today we reviewed his most recent lipid panel and congratulated him on his progress. He had concerns about what could be too low of LDL and we discussed the benefits of continuing current therapies in the setting of low LDL as we do not anticipate adverse effects of LDL being low based on PSCK9 inhibitor trials in which LDL was reduced to < 25 mg/dL in subset of patients.Marland Kitchen   ?? Key risk factors include: CAD, Diabetes (hemoglobin A1c 6.0%) and Lipids (LDL 32 mg/dL, triglycerides 75 mg/dL) last checked on 1/61/09 per [facility]  ?? Continue aspirin 81 mg daily, clopidogrel 75 mg daily, atorvastatin 80 mg daily and evolocumab (Repatha) 140 mg every 2 weeks   ??     #Diabetes - HbA1c 6.0% on 09/01/20 ([facility])  ?? Managed by PCP  ?? Continue empagliflozin 10 mg daily and metformin 1000 mg BID    #Medication Adherence & Access:   ??? Reviewed the indication, dose, and frequency of each medication with patient   ??? Medications reviewed in EPIC medication station and updated today by the clinical pharmacist practitioner.  ??? Manufacturer assistance obtained for: Entresto  ??? Manufacturer's assistance being pursued for: Jardiance    #FOLLOW UP:   ?? Get labs (BMP and BNP) on 09/18/20.   ??? repeat pharmacist phone visit on 09/19/20 at 11:00 AM this will be after ICD device check which will be helpful in assessing patient's volume status  ??? Next cardiologist visit with Dr. Wonda Cheng on 10/31/20 and Dr. Herbert Deaner on 11/18/20  ??? Next PCP visit is in December 2021      I spent a total of 30 minutes on the phone with the patient delivering clinical care and providing education/counseling.    Rhetta Mura, PharmD Candidate    Tylene Fantasia. Kemper Durie, PharmD, BCPS, [facility], CPP  Cardiology Clinical Pharmacist Practitioner  [facility]  [address]  [place], [place] [address]  Phone: [phone]  Fax: [phone]      History of Present Illness:   Douglas Beltran is a 79 y.o. year old male with a history of HFrEF, CKD, and diabetes who presents today for consideration of HF medication optimization at the request of Dr. Carin Hock.   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      Summary of Recent Visits and Key Medication Changes  ??? 08/04/20: BMP included SCr 1.54 mg/dL and K 4.8 mmol/L  ??? 04/19/83: Entresto 24-26 mg was increased to 1 tablet in the morning and 1.5 tablets in the afternoon; furosemide was decreased to 20 mg every other day, repeat labs recommended in 1 week (at PCP office)  to consider spironolactone start  ??? 08/26/20: Increased carvedilol to 25 mg in the morning and 12.5 mg in the evening.   ?? 09/01/20: BMP ([facility]) included K 4.6 mmol/L and SCr 1.3 mg/dL    His current heart failure regimen includes sacubitril/valsartan (Entresto) 24/26 mg - 1 tablet in the morning and 1.5 tablets in the evening, carvedilol 25 mg in the morning and 12.5 mg in the evening and empagliflozin (Jardiance) 10 mg daily    Interval History  He reports no active symptoms of heart failure. No dyspnea, orthopnea, paroxysmal nocturnal dyspnea, or LE edema. No palpitations, chest pain, dizziness, pre-syncope or syncope. He reports a good appetite without bloating, abd distension or early satiety. He  reports laying flat at night without difficulty.    He reports regularly attending the gym and walking in the mall on weekends for ~30 min. Since his last visit, he reported two incidents of falling due to feeling dizzy and then being unable to hold on to something: once at the gym and another at his house in his garage. Fortunately, patient reports being uninjured and states both incidents have not discouraged him and he has continued to implement all of his recent medication changes.     Blood pressure self-monitoring  He reports his blood pressures being 90-100/60s mm Hg. His HR range from 70-80 bpm. He does report feeling dizzy occasionally at the gym or when he gets up too quickly.     Weight self-monitoring  He reports daily weights range from 204-207 lbs.    Cardiovascular History, Studies and Procedures:    Cath / PCI:  04/15/20 Left heart catheterization  1. PCI was performed of the??99%??heavily calcificed Left Main and??LAD. Rotational atherectomy was performed using a 1.75 mm burr. PCI was performed with a 4.0x38 Xience (LM-LAD).??  2. PCI was performed of the??90%??heavily calcificed??RCA. Rotational atherectomy was performed using a 1.75 mm burr. PCI was performed with a 4.0x38 and 3.5x30 with rota (post to 4.5).   3. Excellent??angiographic result with TIMI 3??flow    CV Surgery:     EP Procedures and Devices:     Non-Invasive Evaluation(s):   Echo:   06/18/20 ECHO Summary    1. Limited study to assess ventricular function.    2. The left ventricle is mildly dilated in size with mildly increased wall  thickness.    3. The left ventricular systolic function is severely decreased, LVEF is  visually estimated at 15-20%.    4. The inferior wall, basal inferolateral wall, and mid inferolateral wall  are akinetic.    5. There is grade II diastolic dysfunction (elevated filling pressure).    6. The left atrium is mildly dilated in size.    7. The aortic valve is probably trileaflet with mildly thickened leaflets  with normal excursion.    8. The right ventricle is mildly dilated in size, with normal systolic  Function.    04/11/20 ECHO Summary    1. The left ventricle is moderately dilated in size with mildly increased  wall thickness.    2. The left ventricular systolic function is severely decreased, LVEF is  visually estimated at 20-25%.    3. There is grade II diastolic dysfunction (elevated filling pressure).    4. The mitral valve leaflets are mildly thickened with normal leaflet  mobility.    5. There is mild to moderate mitral valve regurgitation.    6. The left atrium is severely dilated in size.    7. The aortic valve is trileaflet with mildly thickened leaflets with normal  excursion.    8. The right ventricle is mildly dilated in size, with low normal systolic  function.    9. The right atrium is moderately dilated  in size.    10. IVC size and inspiratory change suggest mildly elevated right atrial  pressure. (5-10 mmHg).    11. There is a trivial, anterior pericardial effusion.    Cardiac CT/MRI/Nuclear Tests:     Cardiopulmonary Stress Tests:         Past Medical & Surgical History  Past Medical History:   Diagnosis Date   ??? CHF (congestive heart failure) (CMS-HCC)    ??? Coronary artery disease    ???  Diabetes mellitus (CMS-HCC)    ??? Hypertension 07/12/2014   ??? Macrocytic anemia    ??? Mixed hyperlipidemia 07/12/2014        Past Surgical History:   Procedure Laterality Date   ??? ABDOMINAL SURGERY     ??? EYE SURGERY     ??? JOINT REPLACEMENT     ??? PR CATH PLACE/CORON ANGIO, IMG SUPER/INTERP,W LEFT HEART VENTRICULOGRAPHY N/A 04/15/2020    Procedure: CATH LEFT HEART CATHETERIZATION W INTERVENTION;  Surgeon: Rosana Hoes, MD;  Location: [facility] CATH;  Service: Cardiology   ??? PR INSJ/RPLCMT PERM DFB W/TRNSVNS LDS 1/DUAL CHMBR N/A 08/04/2020    Procedure: Implant Biventricular ICD System;  Surgeon: Dorcas Carrow, MD;  Location: [facility] EP;  Service: Cardiology        Social & Family History  He  reports that he has quit smoking. He has never used smokeless tobacco.     Family History   Problem Relation Age of Onset   ??? Heart disease Father    ??? Diabetes Father    ??? Heart disease Brother         Medications:  Reviewed and updated. Medication list includes revisions made during today's encounter.    Current Outpatient Medications   Medication Sig Dispense Refill   ??? acetaminophen (TYLENOL) 500 MG tablet Take 500 mg by mouth every six (6) hours as needed for pain.     ??? aspirin (ECOTRIN) 81 MG tablet Take 81 mg by mouth daily.      ??? atorvastatin (LIPITOR) 80 MG tablet Take 1 tablet (80 mg total) by mouth daily. 90 tablet 3   ??? calcium polycarbophiL (FIBERCON) 625 mg tablet Take 1,250 mg by mouth nightly.     ??? carvediloL (COREG) 12.5 MG tablet Take 25 mg in the morning and 12.5 mg in the evening 90 tablet 2   ??? clopidogreL (PLAVIX) 75 mg tablet Take 1 tablet (75 mg total) by mouth daily. 90 tablet 3   ??? empagliflozin (JARDIANCE) 10 mg tablet Take 1 tablet (10 mg total) by mouth daily. 30 tablet 11   ??? empty container (SHARPS-A-GATOR DISPOSAL SYSTEM) Misc Use as directed for sharps disposal 1 each 2   ??? evolocumab 140 mg/mL PnIj Inject the contents of 1 pen (140 mg) under the skin every fourteen (14) days. 4 mL 11   ??? furosemide (LASIX) 20 MG tablet Take 1 tablet (20 mg total) by mouth every other day. 90 tablet 3   ??? metFORMIN (GLUCOPHAGE) 1000 MG tablet Take 1,000 mg by mouth 2 (two) times a day with meals.      ??? sacubitriL-valsartan (ENTRESTO) 24-26 mg tablet Take 1 tablet by mouth daily AND 1.5 tablets every evening. 80 tablet 2   ??? sertraline (ZOLOFT) 50 MG tablet Take 50 mg by mouth daily.       No current facility-administered medications for this visit.       Allergies/Adverse Events:  Allergies   Allergen Reactions   ??? Iodine Other (See Comments) and Hives          Lab Results  Lab Results   Component Value Date    PRO-BNP 4,040.0 (H) 04/23/2020    PRO-BNP 4,710.0 (H) 04/10/2020    Creatinine 1.54 (H) 08/04/2020    Creatinine 1.64 (H) 04/25/2020    Creatinine 1.33 (H) 01/19/2012    Creatinine 1.19 01/18/2012    BUN 30 (H) 08/04/2020    BUN 46 (H) 04/25/2020    BUN 33 (H) 01/19/2012  BUN 29 (H) 01/18/2012    Potassium 4.8 (H) 08/04/2020    Potassium 4.6 04/25/2020    Potassium 4.5 01/19/2012    Potassium 4.7 01/18/2012    Magnesium 2.8 (H) 04/23/2020    Magnesium 2.5 (H) 04/16/2020    Magnesium 2.2 01/18/2012    AST 33 04/25/2020    AST 33 04/10/2020    AST 28 01/18/2012    ALT 31 04/25/2020    ALT 42 04/10/2020    ALT 29 01/18/2012    Total Bilirubin 0.9 04/25/2020    Total Bilirubin 0.5 04/10/2020    Total Bilirubin 0.5 01/18/2012    INR 1.08 04/10/2020    INR 1.0 01/18/2012    WBC 5.4 08/04/2020    WBC 10.6 01/19/2012    HGB 14.5 08/04/2020    HGB 12.6 (L) 01/19/2012    HCT 42.8 08/04/2020    HCT 36.8 (L) 01/19/2012    Platelet 169 08/04/2020    Platelet 141 (L) 01/19/2012    Cholesterol, Total 189 05/17/2012    Triglycerides 215 (H) 05/17/2012    HDL 34 (L) 05/17/2012    Non HDL Chol.  155 05/17/2012    LDL Cholesterol, Calculated 112 05/17/2012         Vital Signs:  There were no vitals filed for this visit.    BP Readings from Last 3 Encounters:   08/15/20 110/80   08/04/20 131/74 05/28/20 124/69     Pulse Readings from Last 3 Encounters:   08/15/20 59   08/04/20 75   05/28/20 66     Wt Readings from Last 3 Encounters:   08/15/20 96.9 kg (213 lb 9.6 oz)   08/04/20 94 kg (207 lb 4.8 oz)   05/28/20 97.2 kg (214 lb 3.2 oz)      BMI Readings from Last 3 Encounters:   08/15/20 26.70 kg/m??   08/04/20 25.91 kg/m??   05/28/20 27.50 kg/m??

## 2020-09-12 NOTE — Unmapped (Signed)
I was the supervising physician in the delivery of the service. Carin Hock, MD

## 2020-09-17 NOTE — Unmapped (Signed)
[facility]  Ep Remote Monitoring   Tel:  [phone]  Fax: [phone]    Visit Date:  09/18/2020    Findings: Normal device function, Tested Lead measurements stable and within normal range, Adequate battery reserve-8.8 years    Arrhythmias: No significant new atrial or ventricular arrhythmias    Plan: Continue Routine Remote Monitoring     Last clinic appointment:08/13/2020    Manufacturer of Device: Medtronic       Type of Device: CRT-D / Bi-Ventricular Defibrillator  See scanned/downloaded PDF report for model numbers, serial numbers, and date(s) of implant.    Presenting Rhythm: AS/Bi-VP    ______________________________________________________________________  Percentage Biventricular Pacing: 95 %    Heart Failure Monitoring: Optivol  No Evidence of Fluid Accumulation  ______________________________________________________________________      Please see downloaded PDF file of transmission under Media Tab  for full details of device interrogation to include, when applicable,  battery status/charge time, lead trend data, and programmed parameters.

## 2020-09-18 ENCOUNTER — Institutional Professional Consult (permissible substitution): Admit: 2020-09-18 | Discharge: 2020-09-18 | Payer: MEDICARE

## 2020-09-18 ENCOUNTER — Ambulatory Visit: Admit: 2020-09-18 | Discharge: 2020-09-18 | Payer: MEDICARE

## 2020-09-18 DIAGNOSIS — I255 Ischemic cardiomyopathy: Principal | ICD-10-CM

## 2020-09-18 DIAGNOSIS — Z4502 Encounter for adjustment and management of automatic implantable cardiac defibrillator: Principal | ICD-10-CM

## 2020-09-18 LAB — BASIC METABOLIC PANEL
ANION GAP: 5 mmol/L (ref 5–14)
BLOOD UREA NITROGEN: 25 mg/dL — ABNORMAL HIGH (ref 9–23)
BUN / CREAT RATIO: 19
CHLORIDE: 107 mmol/L (ref 98–107)
CO2: 27 mmol/L (ref 20.0–31.0)
CREATININE: 1.33 mg/dL — ABNORMAL HIGH
EGFR CKD-EPI AA MALE: 59 mL/min/{1.73_m2} — ABNORMAL LOW (ref >=60)
EGFR CKD-EPI NON-AA MALE: 51 mL/min/{1.73_m2} — ABNORMAL LOW (ref >=60)
GLUCOSE RANDOM: 113 mg/dL (ref 70–179)
POTASSIUM: 5.2 mmol/L — ABNORMAL HIGH (ref 3.4–4.5)
SODIUM: 139 mmol/L (ref 135–145)

## 2020-09-18 LAB — B-TYPE NATRIURETIC PEPTIDE
B-TYPE NATRIURETIC PEPTIDE: 636.02 pg/mL — ABNORMAL HIGH (ref <=100)
Natriuretic peptide.B:MCnc:Pt:Bld:Qn:: 636.02 — ABNORMAL HIGH

## 2020-09-18 LAB — EGFR CKD-EPI AA MALE
Glomerular filtration rate/1.73 sq M.predicted.black:ArVRat:Pt:Ser/Plas/Bld:Qn:Creatinine-based formula (CKD-EPI): 59 — ABNORMAL LOW

## 2020-09-18 NOTE — Unmapped (Signed)
All appointments with [facility] EP REMOTE MONITORING occur from home.  You do not come in to the office or hospital for these visits.        Your Device data report from this visit can be found in your Mychart by following the steps below.  Step #1-Select Menu then document center    Step #2-Select my documents    Step #3-Select Device checks    Please contact us with any questions.    Thank you,    [facility] EP REMOTE MONITORING [facility]  Phone. [phone]  Fax. [phone]  Email. [email or web address]

## 2020-09-19 ENCOUNTER — Institutional Professional Consult (permissible substitution): Admit: 2020-09-19 | Discharge: 2020-09-20 | Payer: MEDICARE | Attending: Pharmacist | Primary: Pharmacist

## 2020-09-19 DIAGNOSIS — I5022 Chronic systolic (congestive) heart failure: Principal | ICD-10-CM

## 2020-09-19 MED ORDER — CARVEDILOL 25 MG TABLET
ORAL_TABLET | Freq: Two times a day (BID) | ORAL | 3 refills | 30.00000 days | Status: CP
Start: 2020-09-19 — End: ?

## 2020-09-19 NOTE — Unmapped (Signed)
I have reviewed and agree with the above battery, threshold, lead impedance, and overall device/lead status, diagnostic data including arrhythmic events and therapies on the device evaluation.    Tammara Massing, MD

## 2020-09-19 NOTE — Unmapped (Addendum)
[facility] CARDIOLOGY CLINIC  [address]  [place], [place] [address]  PHONE: [phone]  FAX: [phone]           CLINICAL PHARMACIST PRACTITIONER NOTE   HEART FAILURE    Referring Provider: Dr. Fernande Bras  Cardiologist: Dr. Fernande Bras and Dr. Rivka Spring  Primary Care Provider: Marguarite Arbour, MD    Assessment and Plan:     #HFrEF (LVEF 15-20%) with recent BiV ICD placement on 07/29/20.  Today he reports NYHA class I-II symptoms, with stable BP, HR, and weights. At his last visit, spironolactone 12.5 mg daily was initiated and lasix was decreased from 20 mg every other day to 20 mg only as needed. He is tolerating spironolactone well with no reported adverse events. Labs collected on 10/6 show a stable Scr of 1.33 mg/dL and increased K of 5.2 mmol/L. We discussed how the combination of increasing spironolactone and decreasing furosemide likely contributed to this uptrend in potassium. We discussed potassium containing foods and limiting consumption (patinet endorsed eating bananas, oranges, tomatoes, and occasional spinach). BNP collected on 10/6 was 636.02 pg/mL. His device check earlier this week showed no signs of fluid accumulation. He endorses some lightheadedness when standing up quickly, and confirms he does not drink a lot of liquid throughout the day. He has had dizziness episodes and falls in the last 2 months, hoever denies any falls in the last 1-2 weeks. He denies any swelling and has not taken any doses of PRN Lasix since the last phone visit. Today we discussed increasing his evening carvedilol dose given his dizziness has improved and stable HRs. Douglas Beltran was agreeable to this plan. Lastly, we updated Douglas Beltran on the status of his manufacturer assistance application for his jardiance as he is currently in a coverage gap and it currently costs him ~$140 per month. His mailed packet has been received and is awaiting approval. We reviewed today's plan with Douglas Beltran and he confirmed understanding of the plan.  He was given the ability to ask questions all of which were answered.  ?? Increase carvedilol to 25 mg twice daily (two 12.5 mg tablets BID until this supply runs out. New prescription sent for 25 mg tablets).   ?? Continue sacubitril/valsartan (Entresto) 24/26 mg - 1 tablet in the morning and 1.5 tablets in the evening, spironolactone 12.5 mg daily and empagliflozin (Jardiance) 10 mg daily  and furosemide 20 mg every other day as needed.   ?? May consider titrating sacubitril/valsartan Sherryll Burger) and/or carvedilol at a future visit as appropriate pending upcoming lab results  ?? Monitor and log weights, blood pressure, and heart rate  ?? Avoid NSAIDs   ?? Last ECHO July 2021    #CAD - initially diagnosed with multivessel disease in 2013 with subsequent PCI (rather than CABG per patient decision). NSTEMI with PCI to proximal LAD and mid RCA in May 2021. Today we reviewed his most recent lipid panel and congratulated him on his progress. He had concerns about what could be too low of LDL and we discussed the benefits of continuing current therapies in the setting of low LDL as we do not anticipate adverse effects of LDL being low based on PSCK9 inhibitor trials in which LDL was reduced to < 25 mg/dL in subset of patients.   ?? Key risk factors include: CAD, Diabetes (hemoglobin A1c 6.0%) and Lipids (LDL 32 mg/dL, triglycerides 75 mg/dL) last checked on 01/11/85 per [facility]  ?? Continue aspirin 81 mg daily, clopidogrel  75 mg daily, atorvastatin 80 mg daily and evolocumab (Repatha) 140 mg every 2 weeks     #Diabetes - HbA1c 6.0% on 09/01/20 ([facility])  ?? Managed by PCP  ?? Continue empagliflozin 10 mg daily and metformin 1000 mg BID    #Medication Adherence & Access:   ??? Reviewed the indication, dose, and frequency of each medication with patient   ??? Medications reviewed in EPIC medication station and updated today by the clinical pharmacist practitioner.  ??? Manufacturer assistance obtained for: Entresto  ??? Manufacturer's assistance being pursued for: Jardiance    #FOLLOW UP:   ?? Get labs (BMP) on 09/30/20 to recheck potassium   ??? repeat pharmacist phone visit on 10/02/20 at 10:30AM  ??? Next cardiologist visit with Dr. Wonda Cheng on 10/31/20 and Dr. Herbert Deaner on 11/18/20  ??? Next PCP visit is in December 2021    I spent a total of 30 minutes on the phone with the patient delivering clinical care and providing education/counseling.    Madie Reno, PharmD  PGY2 Cardiology Pharmacy Resident    Tylene Fantasia. Kemper Durie, PharmD, BCPS, [facility], CPP  Cardiology Clinical Pharmacist Practitioner  [facility]  [address]  [place], [place] [address]  Phone: [phone]  Fax: [phone]      History of Present Illness:   Douglas Beltran is a 79 y.o. year old male with a history of HFrEF, CKD, and diabetes who presents today for consideration of HF medication optimization at the request of Dr. Carin Hock.   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    Summary of Recent Visits and Key Medication Changes  ??? 08/04/20: BMP included SCr 1.54 mg/dL and K 4.8 mmol/L  ??? 01/16/39: Entresto 24-26 mg was increased to 1 tablet in the morning and 1.5 tablets in the afternoon; furosemide was decreased to 20 mg every other day, repeat labs recommended in 1 week (at PCP office)  to consider spironolactone start  ??? 08/26/20: Increased carvedilol to 25 mg in the morning and 12.5 mg in the evening.   ?? 09/01/20: BMP ([facility]) included K 4.6 mmol/L and SCr 1.3 mg/dL  ?? 09/17/20: Scr of 1.33 and K of 5.2 mmol/L    His current heart failure regimen includes sacubitril/valsartan (Entresto) 24/26 mg - 1 tablet in the morning and 1.5 tablets in the evening, carvedilol 25 mg in the morning and 12.5 mg in the evening and empagliflozin (Jardiance) 10 mg daily    Interval History  He reports no active symptoms of heart failure. No dyspnea, orthopnea, paroxysmal nocturnal dyspnea, or LE edema. No palpitations, chest pain, dizziness, pre-syncope or syncope. He reports a good appetite without bloating, abd distension or early satiety. He reports laying flat at night without difficulty.    He reports regularly attending the gym and walking in the mall on weekends for ~30 min. Since his last visit, he reported improvement in his dizziness with no reported falls.     Blood pressure self-monitoring  He reports his blood pressures being 100-110/60s mm Hg (highest recorded 130/75 mmHd). His HR range from 65-80 bpm. He does report feeling dizzy occasionally at the gym or when he gets up too quickly.     Weight self-monitoring  He reports daily weights range from 204-207 lbs.    Cardiovascular History, Studies and Procedures:    Cath / PCI:  04/15/20 Left heart catheterization  1. PCI was performed of the??99%??heavily calcificed Left Main and??LAD. Rotational atherectomy was performed using a  1.75 mm burr. PCI was performed with a 4.0x38 Xience (LM-LAD).??  2. PCI was performed of the??90%??heavily calcificed??RCA. Rotational atherectomy was performed using a 1.75 mm burr. PCI was performed with a 4.0x38 and 3.5x30 with rota (post to 4.5).   3. Excellent??angiographic result with TIMI 3??flow    CV Surgery:     EP Procedures and Devices:     Non-Invasive Evaluation(s):   Echo:   06/18/20 ECHO Summary    1. Limited study to assess ventricular function.    2. The left ventricle is mildly dilated in size with mildly increased wall  thickness.    3. The left ventricular systolic function is severely decreased, LVEF is  visually estimated at 15-20%.    4. The inferior wall, basal inferolateral wall, and mid inferolateral wall  are akinetic.    5. There is grade II diastolic dysfunction (elevated filling pressure).    6. The left atrium is mildly dilated in size.    7. The aortic valve is probably trileaflet with mildly thickened leaflets  with normal excursion.    8. The right ventricle is mildly dilated in size, with normal systolic  Function.    04/11/20 ECHO Summary 1. The left ventricle is moderately dilated in size with mildly increased  wall thickness.    2. The left ventricular systolic function is severely decreased, LVEF is  visually estimated at 20-25%.    3. There is grade II diastolic dysfunction (elevated filling pressure).    4. The mitral valve leaflets are mildly thickened with normal leaflet  mobility.    5. There is mild to moderate mitral valve regurgitation.    6. The left atrium is severely dilated in size.    7. The aortic valve is trileaflet with mildly thickened leaflets with normal  excursion.    8. The right ventricle is mildly dilated in size, with low normal systolic  function.    9. The right atrium is moderately dilated  in size.    10. IVC size and inspiratory change suggest mildly elevated right atrial  pressure. (5-10 mmHg).    11. There is a trivial, anterior pericardial effusion.    Cardiac CT/MRI/Nuclear Tests:     Cardiopulmonary Stress Tests:         Past Medical & Surgical History  Past Medical History:   Diagnosis Date   ??? CHF (congestive heart failure) (CMS-HCC)    ??? Coronary artery disease    ??? Diabetes mellitus (CMS-HCC)    ??? Hypertension 07/12/2014   ??? Macrocytic anemia    ??? Mixed hyperlipidemia 07/12/2014        Past Surgical History:   Procedure Laterality Date   ??? ABDOMINAL SURGERY     ??? EYE SURGERY     ??? JOINT REPLACEMENT     ??? PR CATH PLACE/CORON ANGIO, IMG SUPER/INTERP,W LEFT HEART VENTRICULOGRAPHY N/A 04/15/2020    Procedure: CATH LEFT HEART CATHETERIZATION W INTERVENTION;  Surgeon: Rosana Hoes, MD;  Location: [facility] CATH;  Service: Cardiology   ??? PR INSJ/RPLCMT PERM DFB W/TRNSVNS LDS 1/DUAL CHMBR N/A 08/04/2020    Procedure: Implant Biventricular ICD System;  Surgeon: Dorcas Carrow, MD;  Location: [facility] EP;  Service: Cardiology        Social & Family History  He  reports that he has quit smoking. He has never used smokeless tobacco.     Family History   Problem Relation Age of Onset   ??? Heart disease Father    ???  Diabetes Father    ??? Heart disease Brother         Medications:  Reviewed and updated. Medication list includes revisions made during today's encounter.    Current Outpatient Medications   Medication Sig Dispense Refill   ??? acetaminophen (TYLENOL) 500 MG tablet Take 500 mg by mouth every six (6) hours as needed for pain.     ??? aspirin (ECOTRIN) 81 MG tablet Take 81 mg by mouth daily.      ??? atorvastatin (LIPITOR) 80 MG tablet Take 1 tablet (80 mg total) by mouth daily. 90 tablet 3   ??? calcium polycarbophiL (FIBERCON) 625 mg tablet Take 1,250 mg by mouth nightly.     ??? carvediloL (COREG) 12.5 MG tablet Take 25 mg (2 tablets) in the morning and 12.5 mg (1 tablet) in the evening 90 tablet 2   ??? clopidogreL (PLAVIX) 75 mg tablet Take 1 tablet (75 mg total) by mouth daily. 90 tablet 3   ??? empagliflozin (JARDIANCE) 10 mg tablet Take 1 tablet (10 mg total) by mouth daily. 30 tablet 11   ??? empty container (SHARPS-A-GATOR DISPOSAL SYSTEM) Misc Use as directed for sharps disposal 1 each 2   ??? evolocumab 140 mg/mL PnIj Inject the contents of 1 pen (140 mg) under the skin every fourteen (14) days. 4 mL 11   ??? furosemide (LASIX) 20 MG tablet If needed for weight gain > 2 lbs in one day or 5 lbs in on eweek 90 tablet 3   ??? metFORMIN (GLUCOPHAGE) 1000 MG tablet Take 1,000 mg by mouth 2 (two) times a day with meals.      ??? sacubitriL-valsartan (ENTRESTO) 24-26 mg tablet Take 1 tablet by mouth daily AND 1.5 tablets every evening. 80 tablet 2   ??? sertraline (ZOLOFT) 50 MG tablet Take 50 mg by mouth daily.     ??? spironolactone (ALDACTONE) 25 MG tablet Take 0.5 tablets (12.5 mg total) by mouth nightly. 15 tablet 6     No current facility-administered medications for this visit.       Allergies/Adverse Events:  Allergies   Allergen Reactions   ??? Iodine Other (See Comments) and Hives          Lab Results  Lab Results   Component Value Date    PRO-BNP 4,040.0 (H) 04/23/2020    PRO-BNP 4,710.0 (H) 04/10/2020    Creatinine 1.33 (H) 09/18/2020 Creatinine 1.54 (H) 08/04/2020    Creatinine 1.33 (H) 01/19/2012    Creatinine 1.19 01/18/2012    BUN 25 (H) 09/18/2020    BUN 30 (H) 08/04/2020    BUN 33 (H) 01/19/2012    BUN 29 (H) 01/18/2012    Potassium 5.2 (H) 09/18/2020    Potassium 4.8 (H) 08/04/2020    Potassium 4.5 01/19/2012    Potassium 4.7 01/18/2012    Magnesium 2.8 (H) 04/23/2020    Magnesium 2.5 (H) 04/16/2020    Magnesium 2.2 01/18/2012    AST 33 04/25/2020    AST 33 04/10/2020    AST 28 01/18/2012    ALT 31 04/25/2020    ALT 42 04/10/2020    ALT 29 01/18/2012    Total Bilirubin 0.9 04/25/2020    Total Bilirubin 0.5 04/10/2020    Total Bilirubin 0.5 01/18/2012    INR 1.08 04/10/2020    INR 1.0 01/18/2012    WBC 5.4 08/04/2020    WBC 10.6 01/19/2012    HGB 14.5 08/04/2020    HGB 12.6 (L) 01/19/2012  HCT 42.8 08/04/2020    HCT 36.8 (L) 01/19/2012    Platelet 169 08/04/2020    Platelet 141 (L) 01/19/2012    Cholesterol, Total 189 05/17/2012    Triglycerides 215 (H) 05/17/2012    HDL 34 (L) 05/17/2012    Non HDL Chol.  155 05/17/2012    LDL Cholesterol, Calculated 112 05/17/2012         Vital Signs:  There were no vitals filed for this visit.    BP Readings from Last 3 Encounters:   08/15/20 110/80   08/04/20 131/74   05/28/20 124/69     Pulse Readings from Last 3 Encounters:   08/15/20 59   08/04/20 75   05/28/20 66     Wt Readings from Last 3 Encounters:   08/15/20 96.9 kg (213 lb 9.6 oz)   08/04/20 94 kg (207 lb 4.8 oz)   05/28/20 97.2 kg (214 lb 3.2 oz)      BMI Readings from Last 3 Encounters:   08/15/20 26.70 kg/m??   08/04/20 25.91 kg/m??   05/28/20 27.50 kg/m??

## 2020-09-23 NOTE — Unmapped (Signed)
I was the supervising physician in the delivery of the service. Carin Hock, MD

## 2020-09-30 ENCOUNTER — Ambulatory Visit: Admit: 2020-09-30 | Discharge: 2020-10-01 | Payer: MEDICARE

## 2020-09-30 LAB — BASIC METABOLIC PANEL
ANION GAP: 5 mmol/L (ref 5–14)
BLOOD UREA NITROGEN: 30 mg/dL — ABNORMAL HIGH (ref 9–23)
BUN / CREAT RATIO: 21
CALCIUM: 10.1 mg/dL (ref 8.7–10.4)
CHLORIDE: 108 mmol/L — ABNORMAL HIGH (ref 98–107)
CO2: 25.9 mmol/L (ref 20.0–31.0)
CREATININE: 1.43 mg/dL — ABNORMAL HIGH
EGFR CKD-EPI AA MALE: 54 mL/min/{1.73_m2} — ABNORMAL LOW (ref >=60)
GLUCOSE RANDOM: 60 mg/dL — ABNORMAL LOW (ref 70–179)
POTASSIUM: 5.3 mmol/L — ABNORMAL HIGH (ref 3.4–4.5)
SODIUM: 139 mmol/L (ref 135–145)

## 2020-09-30 LAB — BLOOD UREA NITROGEN: Urea nitrogen:MCnc:Pt:Ser/Plas:Qn:: 30 — ABNORMAL HIGH

## 2020-10-02 ENCOUNTER — Institutional Professional Consult (permissible substitution): Admit: 2020-10-02 | Discharge: 2020-10-03 | Payer: MEDICARE | Attending: Pharmacist | Primary: Pharmacist

## 2020-10-02 DIAGNOSIS — I5022 Chronic systolic (congestive) heart failure: Principal | ICD-10-CM

## 2020-10-02 MED ORDER — SODIUM ZIRCONIUM CYCLOSILICATE 5 GRAM ORAL POWDER PACKET
PACK | Freq: Every day | ORAL | 1 refills | 30.00000 days | Status: CP
Start: 2020-10-02 — End: ?

## 2020-10-02 MED ORDER — EMPAGLIFLOZIN 10 MG TABLET
ORAL_TABLET | Freq: Every day | ORAL | 11 refills | 30.00000 days | Status: CP
Start: 2020-10-02 — End: ?

## 2020-10-02 NOTE — Unmapped (Addendum)
[facility]  [address]  [place], [place] [address]  PHONE: [phone]  FAX: [phone]           CLINICAL PHARMACIST PRACTITIONER NOTE   HEART FAILURE    Referring Provider: Dr. Fernande Bras  Cardiologist: Dr. Fernande Bras and Dr. Rivka Spring  Primary Care Provider: Marguarite Arbour, MD    Assessment and Plan:     #HFrEF (LVEF 15-20%) with recent BiV ICD placement on 07/29/20. Today he reports NYHA class I-II symptoms, with stable BP, HR, and weights. He continues to endorse intermittent, positional lightheadedness but denies further falls. His device has not been checked since our last visit, but on 10/7 showed no signs of fluid accumulation. He denies any swelling and has not taken any doses of PRN Lasix since the last phone visit. At his last visit, his carvedilol was increased to 25 mg twice daily. He reports tolerating this dose increase well with no adverse effects. Labs collected on 09/30/20 show a stable Scr of 1.43 mg/dL and elevated K of 5.3 mmol/L. Last visit, we discussed how the combination of increasing spironolactone and decreasing furosemide likely contributed to this uptrend in potassium and discussed potassium containing foods and limiting consumption (patinet endorsed eating bananas, oranges, tomatoes, and occasional spinach). Today we reviewed potassium containing foods again, which he will work diligently to decrease. We discussed how we may need to start a medication (Lokelma) to decrease his potassium should it not respond to dietary restrictions, as potassium currently limits our ability to uptitrate his spironolactone and Entresto. Douglas Beltran is currently in a coverage gap and it currently costs him ~$140 per month. He was unfortunately denied Jardiance manufacturer's assistance due to being over income. He does not believe he has used the [place] free trial card yet, which will give [place] an additional 2 weeks of therapy in addition to the ~10 tablets he has remaining. We reviewed today's plan with Douglas Beltran and he confirmed understanding of the plan.  He was given the ability to ask questions all of which were answered.  ?? No medication changes today  ?? Prescription sent to his local pharmacy with Jardiance free trial card information. After that patient is aware he will be responsible for cost if medication is continued.  ?? Will investigate cost of Lokelma 5 g daily in preparation if K should not respond to dietary changes. If deemed unaffordable will ask for MAP referral to pursue manufacturer assistance.  ?? Continue sacubitril/valsartan (Entresto) 24/26 mg - 1 tablet in the morning and 1.5 tablets in the evening, carvedilol 25 mg BID, spironolactone 12.5 mg daily and empagliflozin (Jardiance) 10 mg daily and furosemide 20 mg every other day as needed.   ?? May consider titrating sacubitril/valsartan Sherryll Burger) and/or carvedilol at a future visit as appropriate pending upcoming lab results  ?? Monitor and log weights, blood pressure, and heart rate  ?? Avoid NSAIDs   ?? Last ECHO July 2021    #CAD - initially diagnosed with multivessel disease in 2013 with subsequent PCI (rather than CABG per patient decision). NSTEMI with PCI to proximal LAD and mid RCA in May 2021. In previous visits, he has had concerns about what could be too low of LDL. At that time we discussed the benefits of continuing current therapies in the setting of low LDL as we do not anticipate adverse effects of LDL being low based on PSCK9 inhibitor trials in which LDL was reduced to < 25 mg/dL in subset of  patients. He reports continuing to tolerate his Repatha injections.   ?? Key risk factors include: CAD, Diabetes (hemoglobin A1c 6.0%) and Lipids (LDL 32 mg/dL, triglycerides 75 mg/dL) last checked on 01/25/07 per [facility] Everywhere  ?? Continue aspirin 81 mg daily, clopidogrel 75 mg daily, atorvastatin 80 mg daily and evolocumab (Repatha) 140 mg every 2 weeks     #Diabetes - HbA1c 6.0% on 09/01/20 ([facility] Everywhere)  ?? Managed by PCP  ?? Continue empagliflozin 10 mg daily and metformin 1000 mg BID    #Medication Adherence & Access:   ??? Reviewed the indication, dose, and frequency of each medication with patient   ??? Medications reviewed in EPIC medication station and updated today by the clinical pharmacist practitioner.  ??? Manufacturer's assistance obtained for: Entresto  ??? Manufacturer's assistance DENIED for: Jardiance   ??? Manufacturer's assistance being pursued for: Lokelma    #FOLLOW UP:   ?? Get labs (BMP) on 10/13/20 to recheck potassium  ??? repeat pharmacist phone visit on 10/14/20 at 1:30 PM  ??? Next cardiologist visit with Dr. Wonda Cheng on 10/31/20 and Dr. Herbert Deaner on 11/18/20  ??? Next PCP visit is in December 2021    I spent a total of 30 minutes on the phone with the patient delivering clinical care and providing education/counseling.    Madie Reno, PharmD  PGY2 Cardiology Pharmacy Resident    Tylene Fantasia. Kemper Durie, PharmD, BCPS, [facility], CPP  Cardiology Clinical Pharmacist Practitioner  [facility]  [address]  [place], [place] [address]  Phone: [phone]  Fax: [phone]      History of Present Illness:   Douglas Beltran is a 79 y.o. year old male with a history of HFrEF, CKD, and diabetes who presents today for consideration of HF medication optimization at the request of Dr. Carin Hock.   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    Summary of Recent Visits and Key Medication Changes  ??? 08/04/20: BMP included SCr 1.54 mg/dL and K 4.8 mmol/L  ??? [birth year]: Entresto 24-26 mg was increased to 1 tablet in the morning and 1.5 tablets in the afternoon; furosemide was decreased to 20 mg every other day, repeat labs recommended in 1 week (at PCP office)  ??? 08/26/20: Increased carvedilol to 25 mg in the morning and 12.5 mg in the evening.   ?? 09/01/20: BMP ([facility]) included K 4.6 mmol/L and SCr 1.3 mg/dL  ?? 09/10/20: furosemide from 20 mg every other day to 20 mg only if needed. Spironolactone 12.5 mg in the evening.   ?? 09/17/20: Scr of 1.33 and K of 5.2 mmol/L    His current heart failure regimen includes sacubitril/valsartan (Entresto) 24/26 mg - 1 tablet in the morning and 1.5 tablets in the evening, carvedilol 25 mg in the morning and 12.5 mg in the evening and empagliflozin (Jardiance) 10 mg daily    Interval History  He reports no active symptoms of heart failure. No dyspnea, orthopnea, paroxysmal nocturnal dyspnea, or LE edema. No palpitations, chest pain, dizziness, pre-syncope or syncope. He reports a good appetite without bloating, abd distension or early satiety. He reports laying flat at night without difficulty.    He reports regularly attending the gym and walking in the mall on weekends for ~30 min. Since his last visit, he reported improvement in his dizziness with no reported falls.     Blood pressure self-monitoring  He reports his blood pressures being 110-130/55-70s mm Hg. His HR range from 65-75 bpm. He  does report feeling dizzy occasionally at the gym or when he gets up too quickly.    Weight self-monitoring  He reports daily weights range from 203-207 lbs.    Cardiovascular History, Studies and Procedures:    Cath / PCI:  04/15/20 Left heart catheterization  1. PCI was performed of the??99%??heavily calcificed Left Main and??LAD. Rotational atherectomy was performed using a 1.75 mm burr. PCI was performed with a 4.0x38 Xience (LM-LAD).??  2. PCI was performed of the??90%??heavily calcificed??RCA. Rotational atherectomy was performed using a 1.75 mm burr. PCI was performed with a 4.0x38 and 3.5x30 with rota (post to 4.5).   3. Excellent??angiographic result with TIMI 3??flow    CV Surgery:     EP Procedures and Devices:     Non-Invasive Evaluation(s):   Echo:   06/18/20 ECHO Summary    1. Limited study to assess ventricular function.    2. The left ventricle is mildly dilated in size with mildly increased wall  thickness.    3. The left ventricular systolic function is severely decreased, LVEF is  visually estimated at 15-20%.    4. The inferior wall, basal inferolateral wall, and mid inferolateral wall  are akinetic.    5. There is grade II diastolic dysfunction (elevated filling pressure).    6. The left atrium is mildly dilated in size.    7. The aortic valve is probably trileaflet with mildly thickened leaflets  with normal excursion.    8. The right ventricle is mildly dilated in size, with normal systolic  Function.    04/11/20 ECHO Summary    1. The left ventricle is moderately dilated in size with mildly increased  wall thickness.    2. The left ventricular systolic function is severely decreased, LVEF is  visually estimated at 20-25%.    3. There is grade II diastolic dysfunction (elevated filling pressure).    4. The mitral valve leaflets are mildly thickened with normal leaflet  mobility.    5. There is mild to moderate mitral valve regurgitation.    6. The left atrium is severely dilated in size.    7. The aortic valve is trileaflet with mildly thickened leaflets with normal  excursion.    8. The right ventricle is mildly dilated in size, with low normal systolic  function.    9. The right atrium is moderately dilated  in size.    10. IVC size and inspiratory change suggest mildly elevated right atrial  pressure. (5-10 mmHg).    11. There is a trivial, anterior pericardial effusion.    Cardiac CT/MRI/Nuclear Tests:     Cardiopulmonary Stress Tests:         Past Medical & Surgical History  Past Medical History:   Diagnosis Date   ??? CHF (congestive heart failure) (CMS-HCC)    ??? Coronary artery disease    ??? Diabetes mellitus (CMS-HCC)    ??? Hypertension 07/12/2014   ??? Macrocytic anemia    ??? Mixed hyperlipidemia 07/12/2014        Past Surgical History:   Procedure Laterality Date   ??? ABDOMINAL SURGERY     ??? EYE SURGERY     ??? JOINT REPLACEMENT     ??? PR CATH PLACE/CORON ANGIO, IMG SUPER/INTERP,W LEFT HEART VENTRICULOGRAPHY N/A 04/15/2020    Procedure: CATH LEFT HEART CATHETERIZATION W INTERVENTION; Surgeon: Rosana Hoes, MD;  Location: [facility] CATH;  Service: Cardiology   ??? PR INSJ/RPLCMT PERM DFB W/TRNSVNS LDS 1/DUAL CHMBR N/A 08/04/2020  Procedure: Implant Biventricular ICD System;  Surgeon: Dorcas Carrow, MD;  Location: [facility] EP;  Service: Cardiology        Social & Family History  He  reports that he has quit smoking. He has never used smokeless tobacco.     Family History   Problem Relation Age of Onset   ??? Heart disease Father    ??? Diabetes Father    ??? Heart disease Brother         Medications:  Reviewed and updated. Medication list includes revisions made during today's encounter.    Current Outpatient Medications   Medication Sig Dispense Refill   ??? acetaminophen (TYLENOL) 500 MG tablet Take 500 mg by mouth every six (6) hours as needed for pain.     ??? aspirin (ECOTRIN) 81 MG tablet Take 81 mg by mouth daily.      ??? atorvastatin (LIPITOR) 80 MG tablet Take 1 tablet (80 mg total) by mouth daily. 90 tablet 3   ??? calcium polycarbophiL (FIBERCON) 625 mg tablet Take 1,250 mg by mouth nightly.     ??? carvediloL (COREG) 25 MG tablet Take 1 tablet (25 mg total) by mouth Two (2) times a day. 60 tablet 3   ??? clopidogreL (PLAVIX) 75 mg tablet Take 1 tablet (75 mg total) by mouth daily. 90 tablet 3   ??? empagliflozin (JARDIANCE) 10 mg tablet Take 1 tablet (10 mg total) by mouth daily. 30 tablet 11   ??? empty container (SHARPS-A-GATOR DISPOSAL SYSTEM) Misc Use as directed for sharps disposal 1 each 2   ??? evolocumab 140 mg/mL PnIj Inject the contents of 1 pen (140 mg) under the skin every fourteen (14) days. 4 mL 11   ??? furosemide (LASIX) 20 MG tablet If needed for weight gain > 2 lbs in one day or 5 lbs in on eweek 90 tablet 3   ??? metFORMIN (GLUCOPHAGE) 1000 MG tablet Take 1,000 mg by mouth 2 (two) times a day with meals.      ??? sacubitriL-valsartan (ENTRESTO) 24-26 mg tablet Take 1 tablet by mouth daily AND 1.5 tablets every evening. 80 tablet 2   ??? sertraline (ZOLOFT) 50 MG tablet Take 50 mg by mouth daily.     ??? spironolactone (ALDACTONE) 25 MG tablet Take 0.5 tablets (12.5 mg total) by mouth nightly. 15 tablet 6     No current facility-administered medications for this visit.       Allergies/Adverse Events:  Allergies   Allergen Reactions   ??? Iodine Other (See Comments) and Hives          Lab Results  Lab Results   Component Value Date    PRO-BNP 4,040.0 (H) 04/23/2020    PRO-BNP 4,710.0 (H) 04/10/2020    Creatinine 1.43 (H) 09/30/2020    Creatinine 1.33 (H) 09/18/2020    Creatinine 1.33 (H) 01/19/2012    Creatinine 1.19 01/18/2012    BUN 30 (H) 09/30/2020    BUN 25 (H) 09/18/2020    BUN 33 (H) 01/19/2012    BUN 29 (H) 01/18/2012    Potassium 5.3 (H) 09/30/2020    Potassium 5.2 (H) 09/18/2020    Potassium 4.5 01/19/2012    Potassium 4.7 01/18/2012    Magnesium 2.8 (H) 04/23/2020    Magnesium 2.5 (H) 04/16/2020    Magnesium 2.2 01/18/2012    AST 33 04/25/2020    AST 33 04/10/2020    AST 28 01/18/2012    ALT 31 04/25/2020    ALT 42 04/10/2020  ALT 29 01/18/2012    Total Bilirubin 0.9 04/25/2020    Total Bilirubin 0.5 04/10/2020    Total Bilirubin 0.5 01/18/2012    INR 1.08 04/10/2020    INR 1.0 01/18/2012    WBC 5.4 08/04/2020    WBC 10.6 01/19/2012    HGB 14.5 08/04/2020    HGB 12.6 (L) 01/19/2012    HCT 42.8 08/04/2020    HCT 36.8 (L) 01/19/2012    Platelet 169 08/04/2020    Platelet 141 (L) 01/19/2012    Cholesterol, Total 189 05/17/2012    Triglycerides 215 (H) 05/17/2012    HDL 34 (L) 05/17/2012    Non HDL Chol.  155 05/17/2012    LDL Cholesterol, Calculated 112 05/17/2012         Vital Signs:  There were no vitals filed for this visit.    BP Readings from Last 3 Encounters:   08/15/20 110/80   08/04/20 131/74   05/28/20 124/69     Pulse Readings from Last 3 Encounters:   08/15/20 59   08/04/20 75   05/28/20 66     Wt Readings from Last 3 Encounters:   08/15/20 96.9 kg (213 lb 9.6 oz)   08/04/20 94 kg (207 lb 4.8 oz)   05/28/20 97.2 kg (214 lb 3.2 oz)      BMI Readings from Last 3 Encounters:   08/15/20 26.70 kg/m??   08/04/20 25.91 kg/m??   05/28/20 27.50 kg/m??

## 2020-10-03 MED ORDER — SODIUM ZIRCONIUM CYCLOSILICATE 5 GRAM ORAL POWDER PACKET
PACK | Freq: Every day | ORAL | 1 refills | 30.00000 days | Status: CP
Start: 2020-10-03 — End: ?

## 2020-10-07 DIAGNOSIS — I5022 Chronic systolic (congestive) heart failure: Principal | ICD-10-CM

## 2020-10-09 NOTE — Unmapped (Signed)
I was immediately available via  phone/pager or present on site.  I reviewed and discussed the case with the Pharmacist, but did not see the patient.  I agree with the assessment and plan as documented in the Pharmacist note. Carin Hock, MD

## 2020-10-13 ENCOUNTER — Institutional Professional Consult (permissible substitution): Admit: 2020-10-13 | Discharge: 2020-10-13 | Payer: MEDICARE

## 2020-10-13 ENCOUNTER — Encounter: Admit: 2020-10-13 | Discharge: 2020-10-13 | Payer: MEDICARE

## 2020-10-13 DIAGNOSIS — I5022 Chronic systolic (congestive) heart failure: Principal | ICD-10-CM

## 2020-10-13 LAB — BASIC METABOLIC PANEL
ANION GAP: 7 mmol/L (ref 5–14)
BLOOD UREA NITROGEN: 25 mg/dL — ABNORMAL HIGH (ref 9–23)
BUN / CREAT RATIO: 18
CALCIUM: 10.2 mg/dL (ref 8.7–10.4)
CHLORIDE: 108 mmol/L — ABNORMAL HIGH (ref 98–107)
CO2: 27.2 mmol/L (ref 20.0–31.0)
CREATININE: 1.4 mg/dL — ABNORMAL HIGH
EGFR CKD-EPI AA MALE: 56 mL/min/{1.73_m2} — ABNORMAL LOW (ref >=60)
GLUCOSE RANDOM: 79 mg/dL (ref 70–179)
POTASSIUM: 5.1 mmol/L — ABNORMAL HIGH (ref 3.4–4.5)
SODIUM: 142 mmol/L (ref 135–145)

## 2020-10-13 LAB — CO2: Carbon dioxide:SCnc:Pt:Ser/Plas:Qn:: 27.2

## 2020-10-13 NOTE — Unmapped (Signed)
Douglas Beltran Heart Failure Monitoring Device Report    Patient:  Douglas Beltran  Date:  October 13, 2020    Device:   Medtronic - [place] HF cardiologist:   Dr. Johann Capers EP cardiologist:   Dr. Sheran Luz   Other cardiologist:  as above     PCP:  JEFFREY Rodena Medin, MD      30 day Report:  There were no readings that exceeded the thresholds.    Optivol device detected intrathoracic impedance that was at baseline, suggesting optimal fluid status.      Please see full interrogation report under Media tab for this encounter.     I reviewed the device-generated report and agree with findings.      Recommendation:  Continue monthly monitoring.      [name], NP

## 2020-10-14 ENCOUNTER — Institutional Professional Consult (permissible substitution): Admit: 2020-10-14 | Discharge: 2020-10-15 | Payer: MEDICARE | Attending: Pharmacotherapy | Primary: Pharmacotherapy

## 2020-10-14 NOTE — Unmapped (Signed)
It was good speaking with you today.    As discussed:  ??? No medication changes today  ??? Continue other medications  ??? Make sure medication list matches medications at home  ??? Monitor and log weights, blood pressure and heart rate  ??? Avoid NSAIDs (such as ibuprofen or naproxen)  ??? Next phone visit 10/29/20 at 9:30am  ??? Please let us know if you have any issues      Call the clinic at [phone] with questions.    Our clinic fax number is [phone].    If you need to reschedule future appointments, please call [phone] or [phone]    After office hours, if you have urgent questions/problems, contact the on-call cardiologist through the hospital operator: [phone].      ENTRESTOTM    Generic: Valsartan and Sacubitril     What is EntrestoTM?                ??? EntrestoTM is a prescription medicine for people with heart failure due to a weak heart.  ??? EntrestoTM helps some patients live longer, feel better, and stay out of the hospital.  ??? It is usually used in combination with other heart failure medicines.     How does EntrestoTM work?                ??? EntrestoTM makes your blood vessels wider which decreases the workload of the heart.   ??? Decreases the amount of fluid in the body    Ask you healthcare provider if EntrestoTM would be a good medicine for you.      How should I take EntrestoTM?   ??? Take EntrestoTM exactly as your doctor tells you to take it.  ??? Take EntrestoTM two times each day 12 hours apart.   ??? Your doctor may change your dose of EntrestoTM during treatment  ??? If you miss a dose, take it as soon as you remember. If it is close to your next dose, do not take the missed dose. Take the next dose at the regular time.        What are the possible side effects?  ??? Low blood pressure (hypotension). Call your doctor if you become dizzy or lightheaded, or develop extreme fatigue  ??? Kidney problems. Your doctor will check your kidneys prior to treatment with EntrestoTM.  ??? Increased amount of potassium in your blood. Your doctor will monitor your potassium levels during treatment with EntrestoTM.   ??? Be aware of the following foods which are high in potassium:    Food Amount Potassium (milligrams)   Baked potato, with skin 1 medium 925   Soy milk 1 cup (8 oz) 600   White beans, canned 1/2 cup 595   Avocado 1/2 fruit 487   Fish: halibut, tuna, cod, snapper 3 oz 480   Swiss chard 1/2 cup, cooked 480   Banana 1 medium 425   Spinach 1/2 cup cooked 420   Papaya 1 small 391   Milk (fat-free, low-fat, whole, buttermilk) 1 cup (8 oz) 350-380   Lima beans 1/2 cup 353   Artichoke, cooked 1 medium  343   Tomato or vegetable juice 1/2 cup (4 oz) 275   Dates 5 pieces 270   Raisins 1/4 cup (2 oz) 270   Potato, boiled 1/2 cup 255   Brussels sprouts 1/2 cup 255   [place] 3 ounces 250   Sunflower or pumpkin seeds 1 oz 240   Yogurt 1/2 cup (4  oz) 238   Orange 1 fruit 237   Broccoli 1/2 cup 230   Cantaloupe 1/2 cup 215   Nuts: almonds, peanuts, hazelnuts, [place], cashew, mixed 1 oz 200   Tuna fish, canned 3 oz 200     ??? Rarely, serious allergic reactions causing swelling of your face, lips, tongue, and throat (angioedema) that may cause trouble breathing and death.     These are not all the side effects of EntrestoTM. You may report side effects to the FDA at 1-800-FDA1088.    How to get help paying for Entresto?  EntrestoTM [employer] is here to help save on the out-of-pocket costs. A free trial offer program is available for everyone with a valid prescription for EntrestoTM. For those with commercial insurance, a co-pay card may further reduce out-of-pocket costs. For those with Medicare, state programs, or uninsured, the company will work with you and check your eligibility for any other money saving programs.

## 2020-10-14 NOTE — Unmapped (Signed)
[facility]  [address]  [place], [place] [address]  PHONE: [phone]  FAX: [phone]           CLINICAL PHARMACIST PRACTITIONER NOTE   HEART FAILURE    Referring Provider: Dr. Fernande Bras  Cardiologist: Dr. Fernande Bras and Dr. Rivka Spring  Primary Care Provider: Marguarite Arbour, MD    Assessment and Plan:     #HFrEF (LVEF 15-20%) with BiV ICD placement on 07/29/20. Today he reports NYHA class I-II symptoms, with stable BP, HR, and weights. He continues to endorse intermittent, positional lightheadedness but denies falls, syncope, or presyncope. His Optivol device was checked on 10/13/20 and showed no readings that exceeded thresholds, suggesting optimal fluid status. He denies any swelling and has not taken any doses of PRN Lasix for several weeks. Overall he is feeling well and is without complaint. We reviewed his overall goals of therapy. Douglas Beltran stated he wants to do everything he can to help his heart and has set a goal of being able to see his granddaughter graduate. Last visit we discussed modifying his intake of potasium-containing foods. While Douglas Beltran made significant changes to his diet (including substituting several high-potassium foods with healthy alternatives), his potassium remains 5.1 mmol/L. As such, we agreed to not make any medications changes at this time. We recently submitted an application for [facility] assistance and will consider adding this at next visit if approved. Once his potassium is improved, we will continue optimizing his HFrEF regimen as appropriate. Of note, Douglas Beltran has about 1-2 weeks of Jardiance remaining after using a 14-day free trial card. He does not think he will be able to afford the donut-hole pricing when his medication runs out. We discussed that it would be reasonable to hold Jardiance (once he runs out) until January 2022 when his insurance resets. Douglas Beltran verbalized understanding of the above plan and was given an opportunity to ask questions, all of which were answered.  ?? No medication changes today  ?? Continue sacubitril/valsartan (Entresto) 24/26 mg - 1 tablet in the morning and 1.5 tablets in the evening, carvedilol 25 mg BID, spironolactone 12.5 mg daily, empagliflozin (Jardiance) 10 mg daily and furosemide 20 mg PRN   ?? Will temporarily hold Jardiance when prescription runs out due to donut hole and will resume in January 2022  ?? Currently pursuing Lokelma manufacturer's assistance  ?? May consider titrating sacubitril/valsartan Sherryll Burger) and/or spironolactone at a future visit pending potassium recheck  ?? Monitor and log weights, blood pressure, and heart rate  ?? Avoid NSAIDs   ?? Last ECHO July 2021    #CAD - initially diagnosed with multivessel disease in 2013 with subsequent PCI (rather than CABG per patient decision). NSTEMI with PCI to proximal LAD and mid RCA in May 2021. In previous visits, he has had concerns about what could be too low of LDL. We previously discussed continuing current therapies as we do not anticipate adverse effects of LDL being low based on PSCK9 inhibitor trials in which LDL was reduced to <25 mg/dL in subset of patients. He reports continuing to tolerate his Repatha injections.   ?? Key risk factors include: CAD, PCI, Hypertension, Diabetes (hemoglobin A1c 6.0%), BMI (26.7 kg/m2) and Lipids (LDL 32 mg/dL, triglycerides 75 mg/dL) last checked on 01/11/85 per Care Everywhere  ?? Continue aspirin 81 mg daily, clopidogrel 75 mg daily, atorvastatin 80 mg daily and evolocumab (Repatha) 140 mg every 2 weeks   ?? Continue DAPT for at  least 12 months (reassess in ~May 2022, or sooner if indicated)    #Diabetes - HbA1c 6.0% on 09/01/20 (Care Everywhere)  ?? Managed by PCP  ?? Continue empagliflozin 10 mg daily and metformin 1000 mg BID    #Medication Adherence & Access:   ??? Reviewed the indication, dose, and frequency of each medication with patient   ??? Medications reviewed in EPIC medication station and updated today by the clinical pharmacist practitioner.  ??? Manufacturer's assistance obtained for: Entresto  ??? Manufacturer's assistance DENIED for: Jardiance   ??? Manufacturer's assistance being pursued for: Lokelma (submitted 10/13/20)    #FOLLOW UP:   ?? repeat pharmacist phone visit on 10/29/20 at 9:30am  ?? Next cardiologist visit with Dr. Wonda Cheng on 10/31/20 and Dr. Herbert Deaner on 11/18/20   ?? Next PCP visit on 11/25/20    I spent a total of 27 minutes on the phone with the patient delivering clinical care and providing education/counseling.      Donnamae Jude, PharmD, BCPS, BCCP, CPP  Cardiology Clinical Pharmacist Practitioner  [facility]  [address]  [place], [place] [address]  Phone: [phone]  Fax: [phone]        History of Present Illness:   Douglas Beltran is a 79 y.o. year old male with a history of HFrEF, CKD, and diabetes who presents today for consideration of HF medication optimization at the request of Dr. Carin Hock.  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    Summary of Recent Visits and Key Medication Changes  ??? 08/04/20: BMP included SCr 1.54 mg/dL and K 4.8 mmol/L  ??? 12/15/06: Entresto 24-26 mg was increased to 1 tablet in the morning and 1.5 tablets in the afternoon; furosemide was decreased to 20 mg every other day, repeat labs recommended in 1 week (at PCP office)  ??? 08/26/20: Increased carvedilol to 25 mg in the morning and 12.5 mg in the evening.   ?? 09/01/20: BMP ([facility]where) included K 4.6 mmol/L and SCr 1.3 mg/dL  ?? 09/10/20: furosemide from 20 mg every other day to 20 mg only if needed. Spironolactone 12.5 mg in the evening.  ?? 09/18/20: BMP included K 5.2 mmol/L and SCr of 1.33  ?? 09/19/20: Increase carvedilol to 25 mg BID (two 12.5mg  tabs BID until this supply runs out, has received new Rx for 25 mg abs)  ?? 09/30/20: BMP included K 5.3 mmol/L and SCr 1.43 mg/dL  ?? 10/13/20: BMP included K 5.1 mmol/L and SCr 1.40 mg/dL    His current heart failure regimen includes sacubitril/valsartan (Entresto) 24/26 mg - 1 tablet in the morning and 1.5 tablets in the evening, carvedilol 25 mg BID, spironolactone 12.5 mg daily, empagliflozin (Jardiance) 10 mg daily and furosemide 20 mg PRN    Interval History  He reports no active symptoms of heart failure. No dyspnea, orthopnea, paroxysmal nocturnal dyspnea, or LE edema. No palpitations, chest pain. He reports a good appetite without bloating, abd distension or early satiety. He reports laying flat at night without difficulty.    Douglas Beltran reports some lightheadedness when standing up too quickly or bending over. He also reports that his oxygen was low at the hospital gym (he has completed cardiac rehab), but felt better later in the day without intervention. He denies LOC, falling down, syncope or presyncope.     In terms of modifying his intake of potassium-containing foods, he reports he has stopped eating spinach, broccoli, oranges; reduced banana intake by 50%. He  substituted green beans, carrots, and apples in his diet. He was congratulated on his excellent efforts at modifying his diet      Blood pressure self-monitoring  He reports his blood pressures being typically being ~110-120s/70s mm Hg. He reports a single low value of 73/48 mm Hg on 10/12/20. He denies feeling poorly with this value and denies dizziness, syncope, pre-syncope, or LOC. His HR is typically 60-70s BPM which is stable.     Weight self-monitoring  He reports daily weights range from 201-205 lbs which is stable for him. He denies signs/symptoms of volume overload. He states he has not needed PRN Lasix in several weeks.        Cardiovascular History, Studies and Procedures:    Cath / PCI:  04/15/20 Left heart catheterization  1. PCI was performed of the??99%??heavily calcificed Left Main and??LAD. Rotational atherectomy was performed using a 1.75 mm burr. PCI was performed with a 4.0x38 Xience (LM-LAD).??  2. PCI was performed of the??90%??heavily calcificed??RCA. Rotational atherectomy was performed using a 1.75 mm burr. PCI was performed with a 4.0x38 and 3.5x30 with rota (post to 4.5).   3. Excellent??angiographic result with TIMI 3??flow    CV Surgery:     EP Procedures and Devices:     Non-Invasive Evaluation(s):   Echo:   06/18/20 ECHO Summary    1. Limited study to assess ventricular function.    2. The left ventricle is mildly dilated in size with mildly increased wall thickness.    3. The left ventricular systolic function is severely decreased, LVEF is visually estimated at 15-20%.    4. The inferior wall, basal inferolateral wall, and mid inferolateral wall are akinetic.    5. There is grade II diastolic dysfunction (elevated filling pressure).    6. The left atrium is mildly dilated in size.    7. The aortic valve is probably trileaflet with mildly thickened leaflets with normal excursion.     8. The right ventricle is mildly dilated in size, with normal systolic Function.     04/11/20 ECHO Summary    1. The left ventricle is moderately dilated in size with mildly increased wall thickness.    2. The left ventricular systolic function is severely decreased, LVEF is visually estimated at 20-25%.    3. There is grade II diastolic dysfunction (elevated filling pressure).    4. The mitral valve leaflets are mildly thickened with normal leaflet mobility.    5. There is mild to moderate mitral valve regurgitation.     6. The left atrium is severely dilated in size.    7. The aortic valve is trileaflet with mildly thickened leaflets with normal excursion.    8. The right ventricle is mildly dilated in size, with low normal systolic function.    9. The right atrium is moderately dilated  in size.    10. IVC size and inspiratory change suggest mildly elevated right atrial pressure. (5-10 mmHg).    11. There is a trivial, anterior pericardial effusion.    Cardiac CT/MRI/Nuclear Tests:     Cardiopulmonary Stress Tests:         Past Medical & Surgical History  Past Medical History: Diagnosis Date   ??? CHF (congestive heart failure) (CMS-HCC)    ??? Coronary artery disease    ??? Diabetes mellitus (CMS-HCC)    ??? Hypertension 07/12/2014   ??? Macrocytic anemia    ??? Mixed hyperlipidemia 07/12/2014        Past Surgical History:  Procedure Laterality Date   ??? ABDOMINAL SURGERY     ??? EYE SURGERY     ??? JOINT REPLACEMENT     ??? PR CATH PLACE/CORON ANGIO, IMG SUPER/INTERP,W LEFT HEART VENTRICULOGRAPHY N/A 04/15/2020    Procedure: CATH LEFT HEART CATHETERIZATION W INTERVENTION;  Surgeon: Rosana Hoes, MD;  Location: [facility] CATH;  Service: Cardiology   ??? PR INSJ/RPLCMT PERM DFB W/TRNSVNS LDS 1/DUAL CHMBR N/A 08/04/2020    Procedure: Implant Biventricular ICD System;  Surgeon: Dorcas Carrow, MD;  Location: [facility] EP;  Service: Cardiology        Social & Family History  He  reports that he has quit smoking. He has never used smokeless tobacco.     Family History   Problem Relation Age of Onset   ??? Heart disease Father    ??? Diabetes Father    ??? Heart disease Brother         Medications:  Reviewed and updated. Medication list includes revisions made during today's encounter.    Current Outpatient Medications   Medication Sig Dispense Refill   ??? acetaminophen (TYLENOL) 500 MG tablet Take 500 mg by mouth every six (6) hours as needed for pain.     ??? aspirin (ECOTRIN) 81 MG tablet Take 81 mg by mouth daily.      ??? atorvastatin (LIPITOR) 80 MG tablet Take 1 tablet (80 mg total) by mouth daily. 90 tablet 3   ??? calcium polycarbophiL (FIBERCON) 625 mg tablet Take 1,250 mg by mouth nightly.     ??? carvediloL (COREG) 25 MG tablet Take 1 tablet (25 mg total) by mouth Two (2) times a day. 60 tablet 3   ??? clopidogreL (PLAVIX) 75 mg tablet Take 1 tablet (75 mg total) by mouth daily. 90 tablet 3   ??? empagliflozin (JARDIANCE) 10 mg tablet Take 1 tablet (10 mg total) by mouth daily. 30 tablet 11   ??? empty container (SHARPS-A-GATOR DISPOSAL SYSTEM) Misc Use as directed for sharps disposal 1 each 2   ??? evolocumab 140 mg/mL PnIj Inject the contents of 1 pen (140 mg) under the skin every fourteen (14) days. 4 mL 11   ??? furosemide (LASIX) 20 MG tablet If needed for weight gain > 2 lbs in one day or 5 lbs in on eweek 90 tablet 3   ??? metFORMIN (GLUCOPHAGE) 1000 MG tablet Take 1,000 mg by mouth 2 (two) times a day with meals.      ??? sacubitriL-valsartan (ENTRESTO) 24-26 mg tablet Take 1 tablet by mouth daily AND 1.5 tablets every evening. 80 tablet 2   ??? sertraline (ZOLOFT) 50 MG tablet Take 50 mg by mouth daily.     ??? sodium zirconium cyclosilicate (LOKELMA) 5 gram PwPk packet Take 1 packet (5 g total) by mouth daily. 30 packet 1   ??? spironolactone (ALDACTONE) 25 MG tablet Take 0.5 tablets (12.5 mg total) by mouth nightly. 15 tablet 6     No current facility-administered medications for this visit.       Allergies/Adverse Events:  Allergies   Allergen Reactions   ??? Iodine Other (See Comments) and Hives          Lab Results  Lab Results   Component Value Date    PRO-BNP 4,040.0 (H) 04/23/2020    PRO-BNP 4,710.0 (H) 04/10/2020    Creatinine 1.40 (H) 10/13/2020    Creatinine 1.43 (H) 09/30/2020    Creatinine 1.33 (H) 01/19/2012    Creatinine 1.19 01/18/2012    BUN  25 (H) 10/13/2020    BUN 30 (H) 09/30/2020    BUN 33 (H) 01/19/2012    BUN 29 (H) 01/18/2012    Potassium 5.1 (H) 10/13/2020    Potassium 5.3 (H) 09/30/2020    Potassium 4.5 01/19/2012    Potassium 4.7 01/18/2012    Magnesium 2.8 (H) 04/23/2020    Magnesium 2.5 (H) 04/16/2020    Magnesium 2.2 01/18/2012    AST 33 04/25/2020    AST 33 04/10/2020    AST 28 01/18/2012    ALT 31 04/25/2020    ALT 42 04/10/2020    ALT 29 01/18/2012    Total Bilirubin 0.9 04/25/2020    Total Bilirubin 0.5 04/10/2020    Total Bilirubin 0.5 01/18/2012    INR 1.08 04/10/2020    INR 1.0 01/18/2012    WBC 5.4 08/04/2020    WBC 10.6 01/19/2012    HGB 14.5 08/04/2020    HGB 12.6 (L) 01/19/2012    HCT 42.8 08/04/2020    HCT 36.8 (L) 01/19/2012    Platelet 169 08/04/2020    Platelet 141 (L) 01/19/2012    Cholesterol, Total 189 05/17/2012    Triglycerides 215 (H) 05/17/2012    HDL 34 (L) 05/17/2012    Non HDL Chol.  155 05/17/2012    LDL Cholesterol, Calculated 112 05/17/2012         Vital Signs:  There were no vitals filed for this visit.    BP Readings from Last 3 Encounters:   08/15/20 110/80   08/04/20 131/74   05/28/20 124/69     Pulse Readings from Last 3 Encounters:   08/15/20 59   08/04/20 75   05/28/20 66     Wt Readings from Last 3 Encounters:   08/15/20 96.9 kg (213 lb 9.6 oz)   08/04/20 94 kg (207 lb 4.8 oz)   05/28/20 97.2 kg (214 lb 3.2 oz)      BMI Readings from Last 3 Encounters:   08/15/20 26.70 kg/m??   08/04/20 25.91 kg/m??   05/28/20 27.50 kg/m??

## 2020-10-20 ENCOUNTER — Telehealth: Payer: Self-pay | Admitting: Cardiovascular Disease

## 2020-10-20 NOTE — Unmapped (Signed)
[facility] Refill Coordination Note    Specialty Medication(s) to be Shipped:   General Specialty: Repatha    Other medication(s) to be shipped: No additional medications requested for fill at this time     [name], DOB: Jul 27, 1941  Phone: There are no phone numbers on file.      All above HIPAA information was verified with patient.     Was a Nurse, learning disability used for this call? No    Completed refill call assessment today to schedule patient's medication shipment from the [facility] [phone]).       Specialty medication(s) and dose(s) confirmed: Regimen is correct and unchanged.   Changes to medications: [name] reports no changes at this time.  Changes to insurance: No  Questions for the pharmacist: No    Confirmed patient received Welcome Packet with first shipment. The patient will receive a drug information handout for each medication shipped and additional FDA Medication Guides as required.       DISEASE/MEDICATION-SPECIFIC INFORMATION        For patients on injectable medications: Patient currently has 0 doses left.  Next injection is scheduled for 11/20.    SPECIALTY MEDICATION ADHERENCE     Medication Adherence    Patient reported X missed doses in the last month: 0  Specialty Medication: repatha 140mg /ml  Patient is on additional specialty medications: No  Patient is on more than two specialty medications: No  Any gaps in refill history greater than 2 weeks in the last 3 months: no  Informant: patient  Reliability of informant: reliable  Provider-estimated medication adherence level: good  Patient is at risk for Non-Adherence: No                repatha 140 mg/ml: 0 days of medicine on hand         SHIPPING     Shipping address confirmed in Epic.     Delivery Scheduled: Yes, Expected medication delivery date: 11/12.     Medication will be delivered via Same Day Courier to the prescription address in Epic WAM.    Antonietta Barcelona   [facility] Specialty Technician

## 2020-10-20 NOTE — Telephone Encounter (Signed)
Patient changed providers deleting recall 

## 2020-10-24 MED FILL — REPATHA SURECLICK 140 MG/ML SUBCUTANEOUS PEN INJECTOR: 56 days supply | Qty: 4 | Fill #2 | Status: AC

## 2020-10-24 MED FILL — REPATHA SURECLICK 140 MG/ML SUBCUTANEOUS PEN INJECTOR: SUBCUTANEOUS | 56 days supply | Qty: 4 | Fill #2

## 2020-10-27 NOTE — Unmapped (Signed)
Pt left VM asking to pass along a message to [employer], CPP. Pt states that the medication he was trying to get finally showed up yesterday. Will forward to provider.

## 2020-10-27 NOTE — Unmapped (Signed)
[facility] CARDIOLOGY CLINIC  [address]  [place], [place] [address]  PHONE: [phone]  FAX: [phone]           CLINICAL PHARMACIST PRACTITIONER NOTE   HEART FAILURE    Referring Provider: Dr. Wonda Cheng  Cardiologist: Dr. Wonda Cheng, Dr. Hessie Dibble, and Dr. Herbert Deaner  Primary Care Provider: Marguarite Arbour, MD     Assessment and Plan:     #HFrEF (LVEF 15-20%) with BiV ICD placement on 07/29/20. Today he reports NYHA class I-II symptoms, with stable BP, HR, and weights. He continues to endorse intermittent, positional lightheadedness but denies falls, syncope, or presyncope. His Optivol device was checked on 10/13/20 and showed no readings that exceeded thresholds, suggesting optimal fluid status. He denies any swelling and has not taken any doses of PRN Lasix for several weeks. Overall he is feeling well and is without complaint. Douglas Beltran received Thompson Caul (through [employer]) and took his first dose yesterday (10/28/20). He will plan to take this three times weekly. We reached a shared decision today to not make any medications changes until we can assess the impact of Lokelma on his potassium level. Once his potassium is improved, we will continue optimizing his HFrEF regimen as appropriate. Douglas Beltran preference is to prioritize increasing the dose of spironolactone from 12.5 to 25 mg daily, as he has difficulty cutting these pills in half. Douglas Beltran has enough Jardiance to last him through mid-December. We discussed that it would be very reasonable to hold Jardiance (once he runs out) until January 2022 when his insurance resets. Douglas Beltran verbalized understanding of the above plan and was given an opportunity to ask questions, all of which were answered.  ?? No medication changes today; first dose of Lokelma yesterday (10/28/20)  ?? Continue sacubitril/valsartan (Entresto) 24/26 mg - 1 tablet in the morning and 1.5 tablets in the evening, carvedilol 25 mg BID, spironolactone 12.5 mg daily, empagliflozin (Jardiance) 10 mg daily and furosemide 20 mg PRN  ?? Will temporarily hold Jardiance when prescription runs out (anticipated mid-December) due to donut hole and will resume in January 2022  ?? May consider titrating sacubitril/valsartan Sherryll Burger) and/or spironolactone at a future visit pending potassium recheck. Douglas Beltran wishes to prioritize the uptitration of spironolactone, as he reports difficulty cutting these tablets in half which is very reasonable  ?? Monitor and log weights, blood pressure, and heart rate  ?? Avoid NSAIDs   ?? Most recent ECHO July 2021    #CAD - initially diagnosed with multivessel disease in 2013 with subsequent PCI (rather than CABG per patient decision). NSTEMI with PCI to proximal LAD and mid RCA in May 2021. He reports continuing to tolerate his Repatha injections.   ?? Key risk factors include: CAD, PCI, Hypertension, Diabetes (hemoglobin A1c 6.0%), BMI (26.7 kg/m2) and Lipids (LDL 32 mg/dL, triglycerides 75 mg/dL) last checked on 01/11/85 per Care Everywhere  ?? Continue aspirin 81 mg daily, clopidogrel 75 mg daily, atorvastatin 80 mg daily and evolocumab (Repatha) 140 mg every 2 weeks   ?? Continue DAPT for at least 12 months (reassess in ~May 2022, or sooner if indicated)    #Diabetes - HbA1c 6.0% on 09/01/20 ([facility] Every[facility])  ?? Managed by PCP  ?? Continue empagliflozin 10 mg daily and metformin 1000 mg BID    #Medication Adherence & Access:   ??? Reviewed the indication, dose, and frequency of each medication with patient   ??? Medications reviewed in EPIC medication station and updated today by the clinical pharmacist  practitioner.  ??? Manufacturer's assistance APPROVED for: Entresto  ??? Manufacturer's assistance APPROVED for: Lokelma  ??? Manufacturer's assistance DENIED for: Jardiance     #FOLLOW UP:   ?? Next cardiologist visits with Dr. Wonda Cheng on 10/31/20 and Dr. Herbert Deaner on 11/18/20. Will obtain labs (BMP, hemoglobin and hematocrit, iron panel, and ferritin) while in clinic on 11/18/20.   ?? repeat pharmacist phone visit on 11/21/20 at 9:30 AM   ?? Next PCP visit on 11/25/20      I spent a total of 20 minutes on the phone with the patient delivering clinical care and providing education/counseling.    Vita Barley, PharmD  PGY-2 Cardiology Pharmacy Resident    Donnamae Jude, PharmD, BCPS, [facility], [place], CPP  Cardiology Clinical Pharmacist Practitioner  [facility]  [address]  [place], [place] [address]  Phone: [phone]  Fax: [phone]        History of Present Illness:   Douglas Beltran is a 79 y.o. year old male with a history of HFrEF, CKD, and diabetes who presents today for consideration of HF medication optimization at the request of Dr. Carin Hock.  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    Summary of Recent Visits and Key Medication Changes  ??? 08/04/20: BMP included SCr 1.54 mg/dL and K 4.8 mmol/L  ??? 02/13/73: Entresto 24-26 mg increased to 1 tablet in the morning and 1.5 tablets in the afternoon; furosemide decreased to 20 mg every other day  ??? 08/26/20: Carvedilol increased to 25 mg in the morning and 12.5 mg in the evening.   ?? 09/01/20: BMP ([facility]) included K 4.6 mmol/L and SCr 1.3 mg/dL  ?? 09/10/20: furosemide decreased from 20 mg every other day to 20 mg only if needed. Spironolactone 12.5 mg in the evening started  ?? 09/18/20: BMP included K 5.2 mmol/L and SCr of 1.33  ?? 09/19/20: Carvedilol increased to 25 mg BID (two 12.5mg  tabs BID until this supply runs out, has received new Rx for 25 mg abs)  ?? 09/30/20: BMP included K 5.3 mmol/L and SCr 1.43 mg/dL  ?? 10/13/20: BMP included K 5.1 mmol/L and SCr 1.40 mg/dL  ?? 10/28/20: Lokelma 5 grams three times weekly started (first dose taken)    His current heart failure regimen includes sacubitril/valsartan (Entresto) 24/26 mg - 1 tablet in the morning and 1.5 tablets in the evening, carvedilol 25 mg BID, spironolactone 12.5 mg daily, empagliflozin (Jardiance) 10 mg daily, furosemide 20 mg PRN and Lokelma 5 grams three times weekly      Interval History  He reports no active symptoms of heart failure. No dyspnea, orthopnea, paroxysmal nocturnal dyspnea, or LE edema. No palpitations, chest pain. He reports a good appetite without bloating, abd distension or early satiety. He reports laying flat at night without difficulty.    Douglas Beltran reports some lightheadedness when standing up too quickly or bending over, however this has not acutely worsened and he reports this has been a chronic problem. He denies LOC, falling down, syncope or presyncope.     Douglas Beltran continues to try to cut back on sweets. He goes to the gym 3 days a week and walks at the mall all other days. He reports that he can walk ~2 miles without getting short of breath, but occasionally has to stop other activities (such as raking leaves) after about 15 minutes to catch his breath.     Blood pressure self-monitoring  He reports his blood pressures being  typically being ~105-120/55-70 mm Hg. He denies persistent dizziness, syncope, pre-syncope, or LOC. He does report intermittent dizziness when standing up quickly or bending over, which is unchanged from prior. His HR is typically 60-70s BPM, which is stable.     Weight self-monitoring  He reports daily weights range from 200-204 lbs which is stable for him. He denies signs/symptoms of volume overload. He states he has not needed PRN Lasix in several weeks.        Cardiovascular History, Studies and Procedures:    Cath / PCI:  04/15/20 Left heart catheterization  1. PCI was performed of the??99%??heavily calcificed Left Main and??LAD. Rotational atherectomy was performed using a 1.75 mm burr. PCI was performed with a 4.0x38 Xience (LM-LAD).??  2. PCI was performed of the??90%??heavily calcificed??RCA. Rotational atherectomy was performed using a 1.75 mm burr. PCI was performed with a 4.0x38 and 3.5x30 with rota (post to 4.5).   3. Excellent??angiographic result with TIMI 3??flow    CV Surgery:     EP Procedures and Devices: Non-Invasive Evaluation(s):   Echo:   06/18/20 ECHO Summary    1. Limited study to assess ventricular function.    2. The left ventricle is mildly dilated in size with mildly increased wall thickness.    3. The left ventricular systolic function is severely decreased, LVEF is visually estimated at 15-20%.    4. The inferior wall, basal inferolateral wall, and mid inferolateral wall are akinetic.    5. There is grade II diastolic dysfunction (elevated filling pressure).    6. The left atrium is mildly dilated in size.    7. The aortic valve is probably trileaflet with mildly thickened leaflets with normal excursion.     8. The right ventricle is mildly dilated in size, with normal systolic Function.     04/11/20 ECHO Summary    1. The left ventricle is moderately dilated in size with mildly increased wall thickness.    2. The left ventricular systolic function is severely decreased, LVEF is visually estimated at 20-25%.    3. There is grade II diastolic dysfunction (elevated filling pressure).    4. The mitral valve leaflets are mildly thickened with normal leaflet mobility.    5. There is mild to moderate mitral valve regurgitation.     6. The left atrium is severely dilated in size.    7. The aortic valve is trileaflet with mildly thickened leaflets with normal excursion.    8. The right ventricle is mildly dilated in size, with low normal systolic function.    9. The right atrium is moderately dilated  in size.    10. IVC size and inspiratory change suggest mildly elevated right atrial pressure. (5-10 mmHg).    11. There is a trivial, anterior pericardial effusion.    Cardiac CT/MRI/Nuclear Tests:     Cardiopulmonary Stress Tests:         Past Medical & Surgical History  Past Medical History:   Diagnosis Date   ??? CHF (congestive heart failure) (CMS-HCC)    ??? Coronary artery disease    ??? Diabetes mellitus (CMS-HCC)    ??? Hypertension 07/12/2014   ??? Macrocytic anemia    ??? Mixed hyperlipidemia 07/12/2014        Past Surgical History:   Procedure Laterality Date   ??? ABDOMINAL SURGERY     ??? EYE SURGERY     ??? JOINT REPLACEMENT     ??? PR CATH PLACE/CORON ANGIO, IMG SUPER/INTERP,W LEFT HEART VENTRICULOGRAPHY N/A  04/15/2020    Procedure: CATH LEFT HEART CATHETERIZATION W INTERVENTION;  Surgeon: Rosana Hoes, MD;  Location: [facility] CATH;  Service: Cardiology   ??? PR INSJ/RPLCMT PERM DFB W/TRNSVNS LDS 1/DUAL CHMBR N/A 08/04/2020    Procedure: Implant Biventricular ICD System;  Surgeon: Dorcas Carrow, MD;  Location: [facility] EP;  Service: Cardiology        Social & Family History  He  reports that he has quit smoking. He has never used smokeless tobacco.     Family History   Problem Relation Age of Onset   ??? Heart disease Father    ??? Diabetes Father    ??? Heart disease Brother         Medications:  Reviewed and updated. Medication list includes revisions made during today's encounter.    Current Outpatient Medications   Medication Sig Dispense Refill   ??? acetaminophen (TYLENOL) 500 MG tablet Take 500 mg by mouth every six (6) hours as needed for pain.     ??? aspirin (ECOTRIN) 81 MG tablet Take 81 mg by mouth daily.      ??? atorvastatin (LIPITOR) 80 MG tablet Take 1 tablet (80 mg total) by mouth daily. 90 tablet 3   ??? calcium polycarbophiL (FIBERCON) 625 mg tablet Take 1,250 mg by mouth nightly.     ??? carvediloL (COREG) 25 MG tablet Take 1 tablet (25 mg total) by mouth Two (2) times a day. 60 tablet 3   ??? clopidogreL (PLAVIX) 75 mg tablet Take 1 tablet (75 mg total) by mouth daily. 90 tablet 3   ??? empty container (SHARPS-A-GATOR DISPOSAL SYSTEM) Misc Use as directed for sharps disposal 1 each 2   ??? evolocumab 140 mg/mL PnIj Inject the contents of 1 pen (140 mg) under the skin every fourteen (14) days. 4 mL 11   ??? furosemide (LASIX) 20 MG tablet If needed for weight gain > 2 lbs in one day or 5 lbs in on eweek 90 tablet 3   ??? metFORMIN (GLUCOPHAGE) 1000 MG tablet Take 1,000 mg by mouth 2 (two) times a day with meals.      ??? sacubitriL-valsartan (ENTRESTO) 24-26 mg tablet Take 1 tablet by mouth daily AND 1.5 tablets every evening. 80 tablet 2   ??? sertraline (ZOLOFT) 50 MG tablet Take 50 mg by mouth daily.     ??? sodium zirconium cyclosilicate (LOKELMA) 5 gram PwPk packet Take 1 packet (5 g total) by mouth 3 (three) times a week. 30 packet 1   ??? spironolactone (ALDACTONE) 25 MG tablet Take 0.5 tablets (12.5 mg total) by mouth nightly. 15 tablet 6   ??? empagliflozin (JARDIANCE) 10 mg tablet Take 1 tablet (10 mg total) by mouth daily. 30 tablet 11     No current facility-administered medications for this visit.       Allergies/Adverse Events:  Allergies   Allergen Reactions   ??? Iodine Other (See Comments) and Hives          Lab Results  Lab Results   Component Value Date    PRO-BNP 4,040.0 (H) 04/23/2020    PRO-BNP 4,710.0 (H) 04/10/2020    Creatinine 1.40 (H) 10/13/2020    Creatinine 1.43 (H) 09/30/2020    Creatinine 1.33 (H) 01/19/2012    Creatinine 1.19 01/18/2012    BUN 25 (H) 10/13/2020    BUN 30 (H) 09/30/2020    BUN 33 (H) 01/19/2012    BUN 29 (H) 01/18/2012    Potassium 5.1 (H) 10/13/2020  Potassium 5.3 (H) 09/30/2020    Potassium 4.5 01/19/2012    Potassium 4.7 01/18/2012    Magnesium 2.8 (H) 04/23/2020    Magnesium 2.5 (H) 04/16/2020    Magnesium 2.2 01/18/2012    AST 33 04/25/2020    AST 33 04/10/2020    AST 28 01/18/2012    ALT 31 04/25/2020    ALT 42 04/10/2020    ALT 29 01/18/2012    Total Bilirubin 0.9 04/25/2020    Total Bilirubin 0.5 04/10/2020    Total Bilirubin 0.5 01/18/2012    INR 1.08 04/10/2020    INR 1.0 01/18/2012    WBC 5.4 08/04/2020    WBC 10.6 01/19/2012    HGB 14.5 08/04/2020    HGB 12.6 (L) 01/19/2012    HCT 42.8 08/04/2020    HCT 36.8 (L) 01/19/2012    Platelet 169 08/04/2020    Platelet 141 (L) 01/19/2012    Cholesterol, Total 189 05/17/2012    Triglycerides 215 (H) 05/17/2012    HDL 34 (L) 05/17/2012    Non HDL Chol.  155 05/17/2012    LDL Cholesterol, Calculated 112 05/17/2012         Vital Signs:  There were no vitals filed for this visit.    BP Readings from Last 3 Encounters:   08/15/20 110/80   08/04/20 131/74   05/28/20 124/69     Pulse Readings from Last 3 Encounters:   08/15/20 59   08/04/20 75   05/28/20 66     Wt Readings from Last 3 Encounters:   08/15/20 96.9 kg (213 lb 9.6 oz)   08/04/20 94 kg (207 lb 4.8 oz)   05/28/20 97.2 kg (214 lb 3.2 oz)      BMI Readings from Last 3 Encounters:   08/15/20 26.70 kg/m??   08/04/20 25.91 kg/m??   05/28/20 27.50 kg/m??

## 2020-10-29 ENCOUNTER — Encounter: Admit: 2020-10-29 | Discharge: 2020-10-30 | Payer: MEDICARE | Attending: Pharmacotherapy | Primary: Pharmacotherapy

## 2020-10-29 DIAGNOSIS — I5022 Chronic systolic (congestive) heart failure: Principal | ICD-10-CM

## 2020-10-29 MED ORDER — SODIUM ZIRCONIUM CYCLOSILICATE 5 GRAM ORAL POWDER PACKET
PACK | ORAL | 1 refills | 70.00000 days
Start: 2020-10-29 — End: 2021-01-20

## 2020-10-29 NOTE — Unmapped (Addendum)
It was good speaking with you today.    As discussed:  ??? Continue taking Lokelma three times weekly.  ??? Continue other medications  ??? Make sure medication list matches medications at home  ??? Monitor and log weights, blood pressure and heart rate  ??? Avoid NSAIDs (such as ibuprofen or naproxen)  ??? Lab check on 11/18/20 when you see Dr. Herbert Deaner in clinic  ??? Next phone visit 11/21/20 at 9:30 AM  ??? Please let us know if you have any issues        Call the clinic at [phone] with questions.    Our clinic fax number is [phone].    If you need to reschedule future appointments, please call [phone] or [phone]    After office hours, if you have urgent questions/problems, contact the on-call cardiologist through the hospital operator: [phone].      ENTRESTOTM    Generic: Valsartan and Sacubitril     What is EntrestoTM?                ??? EntrestoTM is a prescription medicine for people with heart failure due to a weak heart.  ??? EntrestoTM helps some patients live longer, feel better, and stay out of the hospital.  ??? It is usually used in combination with other heart failure medicines.     How does EntrestoTM work?                ??? EntrestoTM makes your blood vessels wider which decreases the workload of the heart.   ??? Decreases the amount of fluid in the body    Ask you healthcare provider if EntrestoTM would be a good medicine for you.      How should I take EntrestoTM?   ??? Take EntrestoTM exactly as your doctor tells you to take it.  ??? Take EntrestoTM two times each day 12 hours apart.   ??? Your doctor may change your dose of EntrestoTM during treatment  ??? If you miss a dose, take it as soon as you remember. If it is close to your next dose, do not take the missed dose. Take the next dose at the regular time.        What are the possible side effects?  ??? Low blood pressure (hypotension). Call your doctor if you become dizzy or lightheaded, or develop extreme fatigue  ??? Kidney problems. Your doctor will check your kidneys prior to treatment with EntrestoTM.  ??? Increased amount of potassium in your blood. Your doctor will monitor your potassium levels during treatment with EntrestoTM.   ??? Be aware of the following foods which are high in potassium:    Food Amount Potassium (milligrams)   Baked potato, with skin 1 medium 925   Soy milk 1 cup (8 oz) 600   White beans, canned 1/2 cup 595   Avocado 1/2 fruit 487   Fish: halibut, tuna, cod, snapper 3 oz 480   Swiss chard 1/2 cup, cooked 480   Banana 1 medium 425   Spinach 1/2 cup cooked 420   Papaya 1 small 391   Milk (fat-free, low-fat, whole, buttermilk) 1 cup (8 oz) 350-380   Lima beans 1/2 cup 353   Artichoke, cooked 1 medium  343   Tomato or vegetable juice 1/2 cup (4 oz) 275   Dates 5 pieces 270   Raisins 1/4 cup (2 oz) 270   Potato, boiled 1/2 cup 255   Brussels sprouts 1/2 cup 255   [place]  3 ounces 250   Sunflower or pumpkin seeds 1 oz 240   Yogurt 1/2 cup (4 oz) 238   Orange 1 fruit 237   Broccoli 1/2 cup 230   Cantaloupe 1/2 cup 215   Nuts: almonds, peanuts, hazelnuts, [place], cashew, mixed 1 oz 200   Tuna fish, canned 3 oz 200     ??? Rarely, serious allergic reactions causing swelling of your face, lips, tongue, and throat (angioedema) that may cause trouble breathing and death.     These are not all the side effects of EntrestoTM. You may report side effects to the FDA at 1-800-FDA1088.    How to get help paying for Entresto?  EntrestoTM [employer] is here to help save on the out-of-pocket costs. A free trial offer program is available for everyone with a valid prescription for EntrestoTM. For those with commercial insurance, a co-pay card may further reduce out-of-pocket costs. For those with Medicare, state programs, or uninsured, the company will work with you and check your eligibility for any other money saving programs.

## 2020-10-31 ENCOUNTER — Encounter
Admit: 2020-10-31 | Discharge: 2020-11-01 | Payer: MEDICARE | Attending: Cardiovascular Disease | Primary: Cardiovascular Disease

## 2020-10-31 DIAGNOSIS — I10 Essential (primary) hypertension: Principal | ICD-10-CM

## 2020-10-31 DIAGNOSIS — I2510 Atherosclerotic heart disease of native coronary artery without angina pectoris: Principal | ICD-10-CM

## 2020-10-31 DIAGNOSIS — I214 Non-ST elevation (NSTEMI) myocardial infarction: Principal | ICD-10-CM

## 2020-10-31 DIAGNOSIS — I5022 Chronic systolic (congestive) heart failure: Principal | ICD-10-CM

## 2020-10-31 DIAGNOSIS — E119 Type 2 diabetes mellitus without complications: Principal | ICD-10-CM

## 2020-10-31 NOTE — Unmapped (Signed)
HF Clinic New Patient/Consult Note    Referring Provider: Rica Records, *   Rica Records, MD  [address]  [facility] [place],  [place] [address]   Primary Provider: Marguarite Arbour, MD   [address] [facility] [place] [address]       Reason for Visit:  Douglas Beltran is a 79 y.o. male with history of severe three-vessel CAD, type 2 diabetes, HLD, MDD referred by Rica Records, * for management of systolic and diastolic heart failure.    Assessment & Plan:    1.  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  Recently admitted to med D in May of this year with NSTEMI found to have worsening HFrEF with EF of 15-20%, grade 2 diastolic dysfunction most recently on echo on 06/18/2020.  Recently underwent BiV ICD placement on 08/04/2020.  Overall well compensated, walked around the mall recently with minimal dyspnea on exertion.  Euvolemic on exam today.  Most recent labs last week showed potassium of 4.8 preventing initiation of spironolactone today.    ???Continue Entresto 24-26 mg tablets to 1 tablet in the morning and 1.5 tablets in the evening  ???Decrease Lasix to 20 mg every other day  ???Try taking 1/2 the coreg pill in the morning and a full pill at night and let us know if you are feeling any better in a week as far as dizziness goes.   ???Continue Jardiance 10 mg daily  ??? Continue spironolactone 12.5 mg daily    2. Coronary artery disease: Diagnosed with multivessel disease in 2013 with subsequent PCI.  NSTEMI with PCI to proximal LAD and mid RCA in May 2021.  No symptoms of angina at this time.  ???Continue aspirin, Plavix, atorvastatin 80 mg  ???On evolocumab    Return in about 8 weeks (around 12/26/2020) for Recheck.    Carin Hock, MD PhD  Advanced Heart Failure, Mechanical Circulatory Support and Transplant Cardiology      History of Present Illness:  Douglas Beltran with history of severe three-vessel CAD, type 2 diabetes, HLD, MDDis being seen at the request of Rica Records, * for heart failure management.      The patient's cardiac history started 2013 when he presented to [place] with shortness of breath and chest tightness was found to have extensive CAD.  At the time was referred for CABG, but patient opted for PCI with placement of stents to 80% proximal mid LAD lesion, 70% mid RCA lesion, and 80% distal RCA stenosis.  At approximately the same time he was diagnosed with ischemic cardiomyopathy, at the time EF 30-35%.  He had been in his usual state of health until May of this year.  While walking around the mall, had significant shortness of breath.  Over the following days his shortness of breath became progressively worse in addition to the start of chest tightness and pressure which prompted him to go to the urgent care, and subsequently sent to the emergency room at [facility].  At that time he underwent left and right heart cath which showed severe three-vessel disease, and an echo done at that time showed an EF of 15-20%.  He was subsequently referred to [facility] for CABG eval versus complex PCI.      Patient had cardiac MRI done on 5 3 which showed a severe  wall motion abnormalities, EF approximately 19%, and significant scarring of apical septal wall but otherwise had viable cardiac tissue, but was deemed to high risk for CABG.  He underwent PCI with 2 DES placed to LM/LAD and RCA.  Since his hospitalization, patient has undergone placement of a BiV ICD.  Repeat echo 2 months following PCI showed similar results to prior, EF of 15-20%, grade 2 diastolic dysfunction.    Over this time since discharge, patient has been able to take walks around the mall but slower than previously.  Does have some dyspnea on exertion when he walks too fast, or walks for an extended period of time around the mall.  No chest tightness or pressure with exertion.  Denies any lower extremity edema.  Lies flat at night with one pillow.  Reports some intermittent dizziness with standing up in the morning, no syncopal events.              Cardiovascular History & Procedures:  Cath / PCI:  PCI 04/15/20:  PCI was performed of the??99%??heavily calcificed Left Main and??LAD. Rotational atherectomy was performed using a 1.75 mm burr. PCI was performed with a 4.0x38 Xience (LM-LAD).??  ??  PCI was performed of the??90%??heavily calcificed??RCA. Rotational atherectomy was performed using a 1.75 mm burr. PCI was performed with a 4.0x38 and 3.5x30 with rota (post to 4.5).   ??  Excellent??angiographic result with TIMI 3??flow.??    CV Surgery: None    EP Procedures and Devices:  BiV ICD placed 08/04/20    Non-Invasive Evaluation(s):  Echo:  06/18/20:  Summary    1. Limited study to assess ventricular function.    2. The left ventricle is mildly dilated in size with mildly increased wall  thickness.    3. The left ventricular systolic function is severely decreased, LVEF is  visually estimated at 15-20%.    4. The inferior wall, basal inferolateral wall, and mid inferolateral wall  are akinetic.    5. There is grade II diastolic dysfunction (elevated filling pressure).    6. The left atrium is mildly dilated in size.    7. The aortic valve is probably trileaflet with mildly thickened leaflets  with normal excursion.    8. The right ventricle is mildly dilated in size, with normal systolic  function.    CT/MRI/Nuclear Tests:  Cardiac MRI 04/14/20:    Left ventricle is dilated (index LVEDV is 191 mL/m2) and there are severe wall motion abnormalities with reduced ejection fraction (LVEF is 19%).  ??  There is scarring of the apical septal wall involving 50-75% thickness. Otherwise, no other areas of myocardial scarring/fibrosis can be convincingly identified.  ??  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     Past Medical History:   Diagnosis Date   ??? CHF (congestive heart failure) (CMS-HCC)    ??? Coronary artery disease    ??? Diabetes mellitus (CMS-HCC)    ??? Hypertension 07/12/2014   ??? Macrocytic anemia    ??? Mixed hyperlipidemia 07/12/2014        Past Surgical History:   Procedure Laterality Date   ??? ABDOMINAL SURGERY     ??? EYE SURGERY     ??? JOINT REPLACEMENT     ??? PR CATH PLACE/CORON ANGIO, IMG SUPER/INTERP,W LEFT HEART VENTRICULOGRAPHY N/A 04/15/2020    Procedure: CATH LEFT HEART CATHETERIZATION W INTERVENTION;  Surgeon: Rosana Hoes, MD;  Location: [facility] CATH;  Service: Cardiology   ??? PR INSJ/RPLCMT PERM DFB W/TRNSVNS  LDS 1/DUAL CHMBR N/A 08/04/2020    Procedure: Implant Biventricular ICD System;  Surgeon: Dorcas Carrow, MD;  Location: [facility] EP;  Service: Cardiology         Allergies:  Iodine    Current Medications:  Current Outpatient Medications   Medication Sig Dispense Refill   ??? acetaminophen (TYLENOL) 500 MG tablet Take 500 mg by mouth every six (6) hours as needed for pain.     ??? amoxicillin (AMOXIL) 500 MG capsule TAKE 4 CAPSULES BY MOUTH 1 HOUR PRIOR TO APPOINTMENT     ??? aspirin (ECOTRIN) 81 MG tablet Take 81 mg by mouth daily.      ??? atorvastatin (LIPITOR) 80 MG tablet Take 1 tablet (80 mg total) by mouth daily. 90 tablet 3   ??? calcium polycarbophiL (FIBERCON) 625 mg tablet Take 1,250 mg by mouth nightly.     ??? carvediloL (COREG) 25 MG tablet Take 1 tablet (25 mg total) by mouth Two (2) times a day. 60 tablet 3   ??? clopidogreL (PLAVIX) 75 mg tablet Take 1 tablet (75 mg total) by mouth daily. 90 tablet 3   ??? empagliflozin (JARDIANCE) 10 mg tablet Take 1 tablet (10 mg total) by mouth daily. 30 tablet 11   ??? empty container (SHARPS-A-GATOR DISPOSAL SYSTEM) Misc Use as directed for sharps disposal 1 each 2   ??? evolocumab 140 mg/mL PnIj Inject the contents of 1 pen (140 mg) under the skin every fourteen (14) days. 4 mL 11   ??? furosemide (LASIX) 20 MG tablet If needed for weight gain > 2 lbs in one day or 5 lbs in on eweek 90 tablet 3   ??? metFORMIN (GLUCOPHAGE) 1000 MG tablet Take 1,000 mg by mouth 2 (two) times a day with meals.      ??? sacubitriL-valsartan (ENTRESTO) 24-26 mg tablet Take 1 tablet by mouth daily AND 1.5 tablets every evening. 80 tablet 2   ??? sertraline (ZOLOFT) 50 MG tablet Take 50 mg by mouth daily.     ??? sodium zirconium cyclosilicate (LOKELMA) 5 gram PwPk packet Take 1 packet (5 g total) by mouth 3 (three) times a week. 30 packet 1   ??? spironolactone (ALDACTONE) 25 MG tablet Take 0.5 tablets (12.5 mg total) by mouth nightly. 15 tablet 6     No current facility-administered medications for this visit.       Family History:  There is no family history of premature coronary artery disease or sudden cardiac death.    Social history:  Former smoker, quit 31 years ago    Review of Systems:  A full review of 10 systems is unremarkable except as stated in the HPI.     Physical Exam:  VITAL SIGNS:   Vitals:    10/31/20 0804   BP: 115/53   Pulse: 57   SpO2: 99%      Wt Readings from Last 3 Encounters:   10/31/20 95.3 kg (210 lb)   08/15/20 96.9 kg (213 lb 9.6 oz)   08/04/20 94 kg (207 lb 4.8 oz)      Today's Body mass index is 26.96 kg/m??.  GENERAL: Well-appearing in no acute distress  HEENT: Normocephalic and atraumatic with anicteric sclerae    NECK: Supple, without lymphadenopathy or thyromegaly. JVP not elevated. There are no carotid bruits  CARDIOVASCULAR: RRR, S1/S2 heard, no S3, no murmurs or gallops  RESPIRATORY: Clear to auscultation without wheezes or crackles.   ABDOMEN: Soft, non-tender, non-distended with audible bowel sounds. There is  no organomegaly or palpable pulsatile mass.   EXTREMITIES:  Lower extremities are warm and without edema. Distal pulses are full and symmetric.  SKIN: No rashes, ecchymosis or petechiae.  NEURO: Alert, pleasant, and appropriate. Non-focal neuro exam    Pertinent Laboratory Studies:   Lab Results   Component Value Date    PRO-BNP 4,040.0 (H) 04/23/2020    PRO-BNP 4,710.0 (H) 04/10/2020    Creatinine 1.40 (H) 10/13/2020 Creatinine 1.43 (H) 09/30/2020    Creatinine 1.33 (H) 01/19/2012    Creatinine 1.19 01/18/2012    BUN 25 (H) 10/13/2020    BUN 30 (H) 09/30/2020    BUN 33 (H) 01/19/2012    BUN 29 (H) 01/18/2012    Potassium 5.1 (H) 10/13/2020    Potassium 5.3 (H) 09/30/2020    Potassium 4.5 01/19/2012    Potassium 4.7 01/18/2012    Magnesium 2.8 (H) 04/23/2020    Magnesium 2.5 (H) 04/16/2020    Magnesium 2.2 01/18/2012    AST 33 04/25/2020    AST 33 04/10/2020    AST 28 01/18/2012    ALT 31 04/25/2020    ALT 42 04/10/2020    ALT 29 01/18/2012    Total Bilirubin 0.9 04/25/2020    Total Bilirubin 0.5 04/10/2020    Total Bilirubin 0.5 01/18/2012    INR 1.08 04/10/2020    INR 1.0 01/18/2012    WBC 5.4 08/04/2020    WBC 10.6 01/19/2012    HGB 14.5 08/04/2020    HGB 12.6 (L) 01/19/2012    HCT 42.8 08/04/2020    HCT 36.8 (L) 01/19/2012    Platelet 169 08/04/2020    Platelet 141 (L) 01/19/2012    Triglycerides 215 (H) 05/17/2012    HDL 34 (L) 05/17/2012    Non HDL Chol.  155 05/17/2012    LDL Cholesterol, Calculated 112 05/17/2012       Other pertinent records were reviewed.    Pertinent Test Results from Today:  ECG: A-Sensed, V-paced without other abnormal findings

## 2020-10-31 NOTE — Unmapped (Signed)
Today,    MEDICATIONS:  NO medication changes today.  Try taking 1/2 the coreg pill in the morning and a full pill at night and let us know if you are feeling any better in a week. If so we will make some medication adjustments.     Call if you have questions about your medications.    LABS:  We will call you if your labs need attention.    NEXT APPOINTMENT:  Return to clinic in 8 weeks with me    In general, to take care of your heart failure:  -Limit your fluid intake to 2 Liters (half-gallon) per day.    -Limit your salt intake to ideally 2-3 grams (2000-3000 mg) per day.  -Weigh yourself daily and record, and bring that weight diary to your next appointment.  (Weight gain of 2-3 pounds in 1 day typically means fluid weight.)    The medications for your heart are to help your heart and help you live longer.    Please contact us before stopping any of your heart medications.    Call the clinic at [phone] with questions.  Our clinic fax number is [phone].  If you need to reschedule future appointments, please call [phone] or [phone]  My office number (c/o De Burrs RN) is [phone] if you need further assistance.  After office hours, if you have urgent questions/problems, contact the on-call cardiologist through the hospital operator: [phone].    To learn more about heart failure, please read [facility]'s Learning to Live with Heart Failure:  [email or web address].    Available online at   [email or web address] - then click the link Learning to Live with Heart Failure patient booklet    OR    [email or web address] - open the window for Medical Management and click the link Living with Heart Failure

## 2020-11-09 DIAGNOSIS — I5022 Chronic systolic (congestive) heart failure: Principal | ICD-10-CM

## 2020-11-09 DIAGNOSIS — I255 Ischemic cardiomyopathy: Principal | ICD-10-CM

## 2020-11-09 MED ORDER — ENTRESTO 24 MG-26 MG TABLET
ORAL_TABLET | Freq: Two times a day (BID) | ORAL | 0 refills | 30.00000 days | Status: CP
Start: 2020-11-09 — End: 2020-12-09

## 2020-11-09 MED ORDER — ENTRESTO 49 MG-51 MG TABLET
ORAL_TABLET | ORAL | 11 refills | 30.00000 days | Status: CP
Start: 2020-11-09 — End: 2020-11-09

## 2020-11-11 MED ORDER — ENTRESTO 24 MG-26 MG TABLET
ORAL_TABLET | Freq: Two times a day (BID) | ORAL | 11 refills | 30.00000 days
Start: 2020-11-11 — End: 2020-12-11

## 2020-11-17 ENCOUNTER — Institutional Professional Consult (permissible substitution): Admit: 2020-11-17 | Discharge: 2020-11-18 | Payer: MEDICARE

## 2020-11-17 DIAGNOSIS — Z4502 Encounter for adjustment and management of automatic implantable cardiac defibrillator: Principal | ICD-10-CM

## 2020-11-17 DIAGNOSIS — I5042 Chronic combined systolic (congestive) and diastolic (congestive) heart failure: Principal | ICD-10-CM

## 2020-11-17 NOTE — Unmapped (Signed)
EP Follow-up      Reason for Visit:  Douglas Beltran is a 79 y.o. male with history of severe three-vessel CAD, type 2 diabetes, HLD, MDD referred by Dr. Wonda Cheng here for f/u s/p BiV-ICD implantation    Assessment & Plan:    1.  Chronic systolic and diastolic heart failure - Ischemic Cardiomyopathy - ACC/AHA stage C, NYHA class II:   Has had known ischemic cardiomyopathy for several years.  Recently admitted to med D in May 2021 with NSTEMI found to have worsening HFrEF with EF of 15-20%, grade 2 diastolic dysfunction most recently on echo on 06/18/2020.    s/p BiV ICD placement on 08/04/2020.    Normal device function. Doing remote monitoring.  Incision well healed. However, very prominent lead underneath skin. Will continue to monitor.   Euvolemic on exam today  On Entresto, Lasix, coreg, Jardiance, spironolactone     2. Coronary artery disease:   Diagnosed with multivessel disease in 2013 with subsequent PCI.  NSTEMI with PCI to proximal LAD and mid RCA in May 2021.    On aspirin, Plavix, atorvastatin, evolocumab    F/u 1 yr    History of Present Illness:  Douglas Beltran with history of severe three-vessel CAD, type 2 diabetes, HLD, MDDis being seen in follow-up s/p BiV-ICD     The patient's cardiac history started 2013 when he presented to [place] with shortness of breath and chest tightness was found to have extensive CAD.  At the time was referred for CABG, but patient opted for PCI with placement of stents to 80% proximal mid LAD lesion, 70% mid RCA lesion, and 80% distal RCA stenosis.  At approximately the same time he was diagnosed with ischemic cardiomyopathy, at the time EF 30-35%.  He had been in his usual state of health until May of this year.  While walking around the mall, had significant shortness of breath.  Over the following days his shortness of breath became progressively worse in addition to the start of chest tightness and pressure which prompted him to go to the urgent care, and subsequently sent to the emergency room at [facility].  At that time he underwent left and right heart cath which showed severe three-vessel disease, and an echo done at that time showed an EF of 15-20%.  He was subsequently referred to [facility] for CABG eval versus complex PCI.      Patient had cardiac MRI done on 5 3 which showed a severe wall motion abnormalities, EF approximately 19%, and significant scarring of apical septal wall but otherwise had viable cardiac tissue, but was deemed to high risk for CABG.  He underwent PCI with 2 DES placed to LM/LAD and RCA.  Since his hospitalization, patient has undergone placement of a BiV ICD.     He exercises regularly. Going to the gym. Has lost quite a bit of weight.             Cardiovascular History & Procedures:  Cath / PCI:  PCI 04/15/20:  PCI was performed of the??99%??heavily calcificed Left Main and??LAD. Rotational atherectomy was performed using a 1.75 mm burr. PCI was performed with a 4.0x38 Xience (LM-LAD).??  ??  PCI was performed of the??90%??heavily calcificed??RCA. Rotational atherectomy was performed using a 1.75 mm burr. PCI was performed with a 4.0x38 and 3.5x30 with rota (post to 4.5).   ??  Excellent??angiographic result with TIMI 3??flow.??    CV Surgery: None    EP Procedures and Devices:  BiV ICD placed 08/04/20    Non-Invasive Evaluation(s):  Echo:  06/18/20:  Summary    1. Limited study to assess ventricular function.    2. The left ventricle is mildly dilated in size with mildly increased wall  thickness.    3. The left ventricular systolic function is severely decreased, LVEF is  visually estimated at 15-20%.    4. The inferior wall, basal inferolateral wall, and mid inferolateral wall  are akinetic.    5. There is grade II diastolic dysfunction (elevated filling pressure).    6. The left atrium is mildly dilated in size.    7. The aortic valve is probably trileaflet with mildly thickened leaflets  with normal excursion.    8. The right ventricle is mildly dilated in size, with normal systolic  function.    CT/MRI/Nuclear Tests:  Cardiac MRI 04/14/20:    Left ventricle is dilated (index LVEDV is 191 mL/m2) and there are severe wall motion abnormalities with reduced ejection fraction (LVEF is 19%).  ??  There is scarring of the apical septal wall involving 50-75% thickness. Otherwise, no other areas of myocardial scarring/fibrosis can be convincingly identified.  ??  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     Past Medical History:   Diagnosis Date   ??? CHF (congestive heart failure) (CMS-HCC)    ??? Coronary artery disease    ??? Diabetes mellitus (CMS-HCC)    ??? Hypertension 07/12/2014   ??? Macrocytic anemia    ??? Mixed hyperlipidemia 07/12/2014        Past Surgical History:   Procedure Laterality Date   ??? ABDOMINAL SURGERY     ??? EYE SURGERY     ??? JOINT REPLACEMENT     ??? PR CATH PLACE/CORON ANGIO, IMG SUPER/INTERP,W LEFT HEART VENTRICULOGRAPHY N/A 04/15/2020    Procedure: CATH LEFT HEART CATHETERIZATION W INTERVENTION;  Surgeon: Rosana Hoes, MD;  Location: [facility] CATH;  Service: Cardiology   ??? PR INSJ/RPLCMT PERM DFB W/TRNSVNS LDS 1/DUAL CHMBR N/A 08/04/2020    Procedure: Implant Biventricular ICD System;  Surgeon: Dorcas Carrow, MD;  Location: [facility] EP;  Service: Cardiology         Allergies:  Iodine    Current Medications:  Current Outpatient Medications   Medication Sig Dispense Refill   ??? acetaminophen (TYLENOL) 500 MG tablet Take 500 mg by mouth every six (6) hours as needed for pain.     ??? amoxicillin (AMOXIL) 500 MG capsule TAKE 4 CAPSULES BY MOUTH 1 HOUR PRIOR TO APPOINTMENT     ??? aspirin (ECOTRIN) 81 MG tablet Take 81 mg by mouth daily.      ??? atorvastatin (LIPITOR) 80 MG tablet Take 1 tablet (80 mg total) by mouth daily. 90 tablet 3   ??? calcium polycarbophiL (FIBERCON) 625 mg tablet Take 1,250 mg by mouth nightly.     ??? carvediloL (COREG) 25 MG tablet Take 1 tablet (25 mg total) by mouth Two (2) times a day. 60 tablet 3   ??? clopidogreL (PLAVIX) 75 mg tablet Take 1 tablet (75 mg total) by mouth daily. 90 tablet 3   ??? empagliflozin (JARDIANCE) 10 mg tablet Take 1 tablet (10 mg total) by mouth daily. 30 tablet 11   ??? empty container (SHARPS-A-GATOR DISPOSAL SYSTEM) Misc Use as directed for sharps disposal 1 each 2   ??? evolocumab 140 mg/mL PnIj Inject the contents of 1 pen (140 mg) under the skin every fourteen (14) days. 4 mL 11   ???  furosemide (LASIX) 20 MG tablet If needed for weight gain > 2 lbs in one day or 5 lbs in on eweek 90 tablet 3   ??? metFORMIN (GLUCOPHAGE) 1000 MG tablet Take 1,000 mg by mouth 2 (two) times a day with meals.      ??? sacubitriL-valsartan (ENTRESTO) 24-26 mg tablet Take 1 tablet by mouth Two (2) times a day. 60 tablet 11   ??? sertraline (ZOLOFT) 50 MG tablet Take 50 mg by mouth daily.     ??? sodium zirconium cyclosilicate (LOKELMA) 5 gram PwPk packet Take 1 packet (5 g total) by mouth 3 (three) times a week. 30 packet 1   ??? spironolactone (ALDACTONE) 25 MG tablet Take 0.5 tablets (12.5 mg total) by mouth nightly. 15 tablet 6     No current facility-administered medications for this visit.       Family History:  There is no family history of premature coronary artery disease or sudden cardiac death.    Social history:  Former smoker, quit 31 years ago    Review of Systems:  A full review of 10 systems is unremarkable except as stated in the HPI.     Physical Exam:  VITAL SIGNS:   Vitals:    11/18/20 1125   BP: 121/67   Pulse: 75   Resp: 16   SpO2: 97%       Wt Readings from Last 3 Encounters:   10/31/20 95.3 kg (210 lb)   08/15/20 96.9 kg (213 lb 9.6 oz)   08/04/20 94 kg (207 lb 4.8 oz)      Today's There is no height or weight on file to calculate BMI.  Gen: seated comfortably in the exam room in no distress  Eyes: EOMI, anicteric  ENT: OP clear with MMM  Resp: CTA bilaterally with normal WOB  CV: no JVD, no carotid bruits, RRR, no audible murmurs; incision well healed; thin skin though  GI: soft, NT/ND without HSM  MS: no deformities  Neuro: A&O, grossly non-focal  Psych: normal affect        Pertinent Laboratory Studies:   Lab Results   Component Value Date    PRO-BNP 4,040.0 (H) 04/23/2020    PRO-BNP 4,710.0 (H) 04/10/2020    Creatinine 1.40 (H) 10/13/2020    Creatinine 1.43 (H) 09/30/2020    Creatinine 1.33 (H) 01/19/2012    Creatinine 1.19 01/18/2012    BUN 25 (H) 10/13/2020    BUN 30 (H) 09/30/2020    BUN 33 (H) 01/19/2012    BUN 29 (H) 01/18/2012    Potassium 5.1 (H) 10/13/2020    Potassium 5.3 (H) 09/30/2020    Potassium 4.5 01/19/2012    Potassium 4.7 01/18/2012    Magnesium 2.8 (H) 04/23/2020    Magnesium 2.5 (H) 04/16/2020    Magnesium 2.2 01/18/2012    AST 33 04/25/2020    AST 33 04/10/2020    AST 28 01/18/2012    ALT 31 04/25/2020    ALT 42 04/10/2020    ALT 29 01/18/2012    Total Bilirubin 0.9 04/25/2020    Total Bilirubin 0.5 04/10/2020    Total Bilirubin 0.5 01/18/2012    INR 1.08 04/10/2020    INR 1.0 01/18/2012    WBC 5.4 08/04/2020    WBC 10.6 01/19/2012    HGB 14.5 08/04/2020    HGB 12.6 (L) 01/19/2012    HCT 42.8 08/04/2020    HCT 36.8 (L) 01/19/2012    Platelet 169 08/04/2020  Platelet 141 (L) 01/19/2012    Triglycerides 215 (H) 05/17/2012    HDL 34 (L) 05/17/2012    Non HDL Chol.  155 05/17/2012    LDL Cholesterol, Calculated 112 05/17/2012

## 2020-11-18 ENCOUNTER — Encounter
Admit: 2020-11-18 | Discharge: 2020-11-19 | Payer: MEDICARE | Attending: Clinical Cardiac Electrophysiology | Primary: Clinical Cardiac Electrophysiology

## 2020-11-18 DIAGNOSIS — D539 Nutritional anemia, unspecified: Principal | ICD-10-CM

## 2020-11-18 DIAGNOSIS — Z7189 Other specified counseling: Principal | ICD-10-CM

## 2020-11-18 DIAGNOSIS — E782 Mixed hyperlipidemia: Principal | ICD-10-CM

## 2020-11-18 DIAGNOSIS — I5042 Chronic combined systolic (congestive) and diastolic (congestive) heart failure: Principal | ICD-10-CM

## 2020-11-18 DIAGNOSIS — Z87891 Personal history of nicotine dependence: Principal | ICD-10-CM

## 2020-11-18 DIAGNOSIS — Z7984 Long term (current) use of oral hypoglycemic drugs: Principal | ICD-10-CM

## 2020-11-18 DIAGNOSIS — I110 Hypertensive heart disease with heart failure: Principal | ICD-10-CM

## 2020-11-18 DIAGNOSIS — I2510 Atherosclerotic heart disease of native coronary artery without angina pectoris: Principal | ICD-10-CM

## 2020-11-18 DIAGNOSIS — Z7982 Long term (current) use of aspirin: Principal | ICD-10-CM

## 2020-11-18 DIAGNOSIS — Z4502 Encounter for adjustment and management of automatic implantable cardiac defibrillator: Principal | ICD-10-CM

## 2020-11-18 DIAGNOSIS — Z7902 Long term (current) use of antithrombotics/antiplatelets: Principal | ICD-10-CM

## 2020-11-18 DIAGNOSIS — Z79899 Other long term (current) drug therapy: Principal | ICD-10-CM

## 2020-11-18 DIAGNOSIS — E119 Type 2 diabetes mellitus without complications: Principal | ICD-10-CM

## 2020-11-18 DIAGNOSIS — I255 Ischemic cardiomyopathy: Principal | ICD-10-CM

## 2020-11-18 DIAGNOSIS — F329 Major depressive disorder, single episode, unspecified: Principal | ICD-10-CM

## 2020-11-18 DIAGNOSIS — I252 Old myocardial infarction: Principal | ICD-10-CM

## 2020-11-18 DIAGNOSIS — I5022 Chronic systolic (congestive) heart failure: Principal | ICD-10-CM

## 2020-11-18 LAB — BASIC METABOLIC PANEL
ANION GAP: 8 mmol/L (ref 5–14)
BLOOD UREA NITROGEN: 25 mg/dL — ABNORMAL HIGH (ref 9–23)
BUN / CREAT RATIO: 19
CALCIUM: 10.5 mg/dL — ABNORMAL HIGH (ref 8.7–10.4)
CHLORIDE: 107 mmol/L (ref 98–107)
CO2: 25.5 mmol/L (ref 20.0–31.0)
CREATININE: 1.33 mg/dL — ABNORMAL HIGH
EGFR CKD-EPI AA MALE: 59 mL/min/{1.73_m2} — ABNORMAL LOW (ref >=60)
EGFR CKD-EPI NON-AA MALE: 51 mL/min/{1.73_m2} — ABNORMAL LOW (ref >=60)
GLUCOSE RANDOM: 87 mg/dL (ref 70–179)
POTASSIUM: 5 mmol/L — ABNORMAL HIGH (ref 3.4–4.5)
SODIUM: 140 mmol/L (ref 135–145)

## 2020-11-18 LAB — IRON PANEL
IRON SATURATION: 32 %
IRON: 125 ug/dL
TOTAL IRON BINDING CAPACITY: 389 ug/dL (ref 250–425)

## 2020-11-18 LAB — HEMOGLOBIN AND HEMATOCRIT, BLOOD
HEMATOCRIT: 43.4 % (ref 38.0–50.0)
HEMOGLOBIN: 14.6 g/dL (ref 13.5–17.5)

## 2020-11-18 LAB — FERRITIN: FERRITIN: 30.8 ng/mL

## 2020-11-18 NOTE — Unmapped (Signed)
[place] Heart Failure Monitoring Device Report    Patient:  Douglas Beltran  Date:  November 17, 2020    Device:   Medtronic - Dexter HF cardiologist:   Dr. Johann Capers EP cardiologist:   [facility]   Other cardiologist:  none     PCP:  JEFFREY Rodena Medin, MD      30 day Report:  There were no readings that exceeded the thresholds.    Optivol device detected intrathoracic impedance that was at baseline, suggesting optimal fluid status.        Please see full interrogation report under Media tab for this encounter.     I reviewed the device-generated report and agree with findings.      Recommendation:  Continue monthly monitoring.      Hope Budds, NP

## 2020-11-18 NOTE — Unmapped (Signed)
Your device function is normal today.      Home monitoring results are now available in your mychart.    Appointment Reminders-PLEASE IGNORE THE ARRIVE BY TIME.  These appointments occur from HOME        For general questions or concerns, please call the DEVICE CLINIC during normal business hours. If you are having a medical emergency, go to the nearest emergency room or call 911.  For ICDs only  If you receive ONE shock If you receive ONE shock If you receive TWO or MORE shocks   You have no symptoms You have symptoms:    Chest pain/pressure  Shortness of breath  Rapid heart action  Dizziness  Generally don't feel well    Call your heart doctor to discuss the shock and arrange for appropriate follow-up Call 911. Follow-up with a call to your doctor. Call 911 immediately. Follow-up with a call to your heart doctor.           For Device Related Questions please contact the Device Clinic [phone] (Monday-Friday 8:00-4:30p.m)    To schedule or change an appointment with Device Clinic call: [phone]    For technical assistance with home monitors:  Please call your home monitoring company    [employer] [phone]  [employer] [phone]  [employer]  [phone]. [employer] [place]) [phone]  Future Appointments   Date Time Provider Department Center   12/17/2020 10:15 AM Rosana Hoes, MD [facility]   12/18/2020 12:00 AM [facility] EP REMOTE MONITORING [facility]   12/19/2020  9:00 AM Carin Hock, MD [facility]   03/19/2021 12:00 AM [place] EP REMOTE MONITORING [facility]   06/18/2021 12:00 AM [place] EP REMOTE MONITORING [facility]

## 2020-11-18 NOTE — Unmapped (Signed)
[facility]  Cardiac Device Clinic-  [place]  Tel: [phone]  Fax: [phone]    Cardiac Implanted Electronic Device Evaluation-In Person Outpatient Visit    Visit Date:  11/18/2020    Manufacturer of Device: [employer]     Model: Cobalt XT HF Quad [id number]  Type of Device: Dual Chamber Defibrillator  Indication for Implant: primary prevention    Battery: 9.6 years estimated longevity    Mode: DDDR  Lower rate limit: 50 bpm  Upper Tracking Limit: 130 bpm  Tachy Settings:      Presenting rhythm:  AS/Bi-VP  Underlying rhythm: NSR   Dependent: No    Pacing Percentages  AP: 11%  RVP: 97%  Bi-VP: 100%    Impedence (ohms)  Atrial lead: 760  Right Ventricular lead: 399  Left Ventricular lead: 646  Shock lead: 57     Sensing (mV)  Atrial lead: 1.49mV  Right Ventricular lead: 11.81mV     Capture threshold (V @ ms)  Atrial lead: 0.50V @ 0.33ms  Right Ventricular lead: 0.50V @ 0.3ms  Left Ventricular lead: 1.0V @ 0.72ms    Diagnostics/Episodes  ?? None    Fluid Monitoring  ??  No Evidence of Fluid Accumulation       Reprogramming  ?? None    Follow-up Plan:  Schedule Clinic Follow-Up in 1 years patient does participate in remote monitoring      Please see scanned and / or downloaded PDF file in this patient's chart for this encounter for full details of device interrogation and reprogramming.

## 2020-11-19 NOTE — Unmapped (Signed)
[facility] CARDIOLOGY CLINIC  [address]  [place], [place] [address]  PHONE: [phone]  FAX: [phone]           CLINICAL PHARMACIST PRACTITIONER NOTE   HEART FAILURE    Referring Provider: Dr. Wonda Cheng  Cardiologist: Dr. Wonda Cheng, Dr. Hessie Dibble, and Dr. Herbert Deaner  Primary Care Provider: Marguarite Arbour, MD     Assessment and Plan:     #HFrEF (LVEF 15-20%) with BiV ICD placement on 07/29/20. Today he reports NYHA class I-II symptoms, with stable BP, HR, and weights. He continues to endorse intermittent, positional lightheadedness but denies falls, syncope, or presyncope. His Optivol device was checked on 10/13/20 and showed no readings that exceeded thresholds, suggesting optimal fluid status. He denies any swelling and has not taken any doses of PRN Lasix for several weeks. Overall he is feeling well and is without complaint. Despite taking Lokelma 5 grams TIW, his potassium has not improved much (K 5.0 mmol/L). We discussed increasing the dose of Lokelma to 5 grams once daily. However, Mr. [name] wanted to wait and see what his potassium is at an upcoming PCP visit. Since he is having difficulty cutting spironolactone tablets in half, we reached a shared decision to change his dose to spironolactone 25 mg every other day to avoid cutting tablets. We also agreed to follow-up by phone in several weeks. If his potassium does not improve and/or if he does not want to increase Lokelma dose, we will likely consider his regimen fully optimized since he experienced dizziness with a higher dose of carvedilol.  Regarding Jardiance, he says he picked up another refill even though he is in the donut hole. Mr. [name] verbalized understanding of the above plan and was given an opportunity to ask questions, all of which were answered.  ?? CHANGE spironolactone to 25 mg every other day  ?? Continue sacubitril/valsartan (Entresto) 24/26 mg - 1 tablet in the morning and 1.5 tablets in the evening, carvedilol 25 mg in the AM and 12.5 mg in the PM, empagliflozin (Jardiance) 10 mg daily and furosemide 20 mg PRN  ?? May consider titrating sacubitril/valsartan Sherryll Burger) and/or spironolactone at a future visit pending potassium recheck and BP. If unable to titrate, may consider reducing Entresto to 1 tablet BID due to uncertain marginal benefit of current dose and to minimize pill cutting.  ?? Monitor and log weights, blood pressure, and heart rate  ?? Avoid NSAIDs   ?? Ferritin 30.8 ng/mL, TSAT 32%; HGB 14.6 g/dL; not indicated for IV iron at this time  ?? Most recent ECHO July 2021    #CAD - initially diagnosed with multivessel disease in 2013 with subsequent PCI (rather than CABG per patient decision). NSTEMI with PCI to proximal LAD and mid RCA in May 2021. He reports continuing to tolerate his Repatha injections.   ?? Key risk factors include: CAD, PCI, Hypertension, Diabetes (hemoglobin A1c 6.0%), BMI (25.6 kg/m2) and Lipids (LDL 32 mg/dL, triglycerides 75 mg/dL) (lipid panel and ZHY8M last checked on 09/01/20 per Care Everywhere)  ?? Continue aspirin 81 mg daily, clopidogrel 75 mg daily, atorvastatin 80 mg daily and evolocumab (Repatha) 140 mg every 2 weeks   ?? Continue DAPT for at least 12 months (reassess in ~May 2022, or sooner if indicated)    #Diabetes - HbA1c 6.0% on 09/01/20 (Care Everywhere)  ?? Managed by PCP  ?? Continue empagliflozin 10 mg daily and metformin 1000 mg BID    #Medication Adherence & Access:   ??? Reviewed the indication,  dose, and frequency of each medication with patient   ??? Medications reviewed in EPIC medication station and updated today by the clinical pharmacist practitioner.  ??? Manufacturer's assistance APPROVED for: Entresto  ??? Manufacturer's assistance APPROVED for: Lokelma  ??? Manufacturer's assistance DENIED for: Jardiance     #FOLLOW UP:   ?? Follow-up with Dr. Hessie Dibble on 12/17/20 and Dr. Mathis Fare on 12/19/20 as scheduled  ?? repeat pharmacist phone visit on 12/30/20 at 1pm to if patient interested in increasing Lokelma to 5 grams once daily          I spent a total of 17 minutes on the phone with the patient delivering clinical care and providing education/counseling.     The patient was physically located in [place] or a state in which I am permitted to provide care. The patient and/or parent/guardian understood that s/he may incur co-pays and cost sharing, and agreed to the telemedicine visit. The visit was reasonable and appropriate under the circumstances given the patient's presentation at the time.    The patient and/or parent/guardian has been advised of the potential risks and limitations of this mode of treatment (including, but not limited to, the absence of in-person examination) and has agreed to be treated using telemedicine. The patient's/patient's family's questions regarding telemedicine have been answered.     If the visit was completed in an ambulatory setting, the patient and/or parent/guardian has also been advised to contact their provider???s office for worsening conditions, and seek emergency medical treatment and/or call 911 if the patient deems either necessary.      Donnamae Jude, PharmD, BCPS, BCCP, CPP  Cardiology Clinical Pharmacist Practitioner  [facility]  [address]  [place], [place] [address]  Phone: [phone]  Fax: [phone]        History of Present Illness:   [name] is a 79 y.o. year old male with a history of HFrEF, CKD, and diabetes who presents today for consideration of HF medication optimization at the request of Dr. Carin Hock.  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    Summary of Recent Visits and Key Medication Changes  ??? 08/04/20: BMP included SCr 1.54 mg/dL and K 4.8 mmol/L  ??? 06/14/21: Entresto 24-26 mg increased to 1 tablet in the morning and 1.5 tablets in the afternoon; furosemide decreased to 20 mg every other day  ??? 08/26/20: Carvedilol increased to 25 mg in the morning and 12.5 mg in the evening.   ?? 09/01/20: BMP ([facility]) included K 4.6 mmol/L and SCr 1.3 mg/dL  ?? 09/10/20: furosemide decreased from 20 mg every other day to 20 mg only if needed. Spironolactone 12.5 mg in the evening started  ?? 09/18/20: BMP included K 5.2 mmol/L and SCr of 1.33  ?? 09/19/20: Carvedilol increased to 25 mg BID (two 12.5mg  tabs BID until this supply runs out, has received new Rx for 25 mg abs)  ?? 09/30/20: BMP included K 5.3 mmol/L and SCr 1.43 mg/dL  ?? 10/13/20: BMP included K 5.1 mmol/L and SCr 1.40 mg/dL  ?? 10/28/20: Lokelma 5 grams three times weekly started (first dose taken)  ?? 10/31/20: Lasix decreased to 20 mg every other day; carvedilol decreased to 25 mg in the AM and 12.5 mg in the PM due to dizziness  ?? 11/18/20: BMP with K 5.0 mmol/L, SCr 1.33 mg/dL; Ferritin 30.8 ng/mL, TSAT 32%; HGB 14.6 g/dL    His current heart failure regimen includes sacubitril/valsartan (Entresto) 24/26 mg -  1 tablet in the morning and 1.5 tablets in the evening, carvedilol 25 mg in the AM and 12.5 mg in the PM, spironolactone 12.5 mg daily, empagliflozin (Jardiance) 10 mg daily, furosemide 20 mg PRN and Lokelma 5 grams three times weekly       Interval History  He reports no active symptoms of heart failure. No dyspnea, orthopnea, paroxysmal nocturnal dyspnea, or LE edema. No palpitations, chest pain. He reports a good appetite without bloating, abd distension or early satiety. He reports laying flat at night without difficulty.    Mr. [name] endorses occasional SOB but exercises regularly without issue. Notably, he feels his dizziness is much improved since decreasing his carvedilol dose.         Blood pressure self-monitoring  He checks his BP regularly (recent values below). His HR is normally 60-70s mm Hg. He denies feeling poorly with any of the below values, including the isolated 93/44 mm Hg measurement.     114/71  117/77  93/44  120/68  125/55  122/65  134/67      Weight self-monitoring  He weighs himself daily. His recent weight has been 196-197 lbs (down from ~200-204 lbs previously). He has not required PRN Lasix in several weeks. He denies signs/symptoms of dehydration or hypovolemia.          Cardiovascular History, Studies and Procedures:    Cath / PCI:  04/15/20 Left heart catheterization  1. PCI was performed of the??99%??heavily calcificed Left Main and??LAD. Rotational atherectomy was performed using a 1.75 mm burr. PCI was performed with a 4.0x38 Xience (LM-LAD).??  2. PCI was performed of the??90%??heavily calcificed??RCA. Rotational atherectomy was performed using a 1.75 mm burr. PCI was performed with a 4.0x38 and 3.5x30 with rota (post to 4.5).   3. Excellent??angiographic result with TIMI 3??flow    CV Surgery:     EP Procedures and Devices:     Non-Invasive Evaluation(s):   Echo:   06/18/20 ECHO Summary    1. Limited study to assess ventricular function.    2. The left ventricle is mildly dilated in size with mildly increased wall thickness.    3. The left ventricular systolic function is severely decreased, LVEF is visually estimated at 15-20%.    4. The inferior wall, basal inferolateral wall, and mid inferolateral wall are akinetic.    5. There is grade II diastolic dysfunction (elevated filling pressure).    6. The left atrium is mildly dilated in size.    7. The aortic valve is probably trileaflet with mildly thickened leaflets with normal excursion.     8. The right ventricle is mildly dilated in size, with normal systolic Function.     04/11/20 ECHO Summary    1. The left ventricle is moderately dilated in size with mildly increased wall thickness.    2. The left ventricular systolic function is severely decreased, LVEF is visually estimated at 20-25%.    3. There is grade II diastolic dysfunction (elevated filling pressure).    4. The mitral valve leaflets are mildly thickened with normal leaflet mobility.    5. There is mild to moderate mitral valve regurgitation.     6. The left atrium is severely dilated in size.    7. The aortic valve is trileaflet with mildly thickened leaflets with normal excursion.    8. The right ventricle is mildly dilated in size, with low normal systolic function.    9. The right atrium is moderately dilated  in size.  10. IVC size and inspiratory change suggest mildly elevated right atrial pressure. (5-10 mmHg).    11. There is a trivial, anterior pericardial effusion.    Cardiac CT/MRI/Nuclear Tests:     Cardiopulmonary Stress Tests:         Past Medical & Surgical History  Past Medical History:   Diagnosis Date   ??? CHF (congestive heart failure) (CMS-HCC)    ??? Coronary artery disease    ??? Diabetes mellitus (CMS-HCC)    ??? Hypertension 07/12/2014   ??? Macrocytic anemia    ??? Mixed hyperlipidemia 07/12/2014        Past Surgical History:   Procedure Laterality Date   ??? ABDOMINAL SURGERY     ??? EYE SURGERY     ??? JOINT REPLACEMENT     ??? PR CATH PLACE/CORON ANGIO, IMG SUPER/INTERP,W LEFT HEART VENTRICULOGRAPHY N/A 04/15/2020    Procedure: CATH LEFT HEART CATHETERIZATION W INTERVENTION;  Surgeon: Rosana Hoes, MD;  Location: [facility] CATH;  Service: Cardiology   ??? PR INSJ/RPLCMT PERM DFB W/TRNSVNS LDS 1/DUAL CHMBR N/A 08/04/2020    Procedure: Implant Biventricular ICD System;  Surgeon: Dorcas Carrow, MD;  Location: [facility] EP;  Service: Cardiology        Social & Family History  He  reports that he has quit smoking. He has never used smokeless tobacco.     Family History   Problem Relation Age of Onset   ??? Heart disease Father    ??? Diabetes Father    ??? Heart disease Brother         Medications:  Reviewed and updated. Medication list includes revisions made during today's encounter.    Current Outpatient Medications   Medication Sig Dispense Refill   ??? acetaminophen (TYLENOL) 500 MG tablet Take 500 mg by mouth every six (6) hours as needed for pain.     ??? amoxicillin (AMOXIL) 500 MG capsule TAKE 4 CAPSULES BY MOUTH 1 HOUR PRIOR TO APPOINTMENT     ??? aspirin (ECOTRIN) 81 MG tablet Take 81 mg by mouth daily.      ??? atorvastatin (LIPITOR) 80 MG tablet Take 1 tablet (80 mg total) by mouth daily. 90 tablet 3   ??? calcium polycarbophiL (FIBERCON) 625 mg tablet Take 1,250 mg by mouth nightly.     ??? carvediloL (COREG) 25 MG tablet Take 1 tablet (25 mg total) by mouth Two (2) times a day. (Patient taking differently: Take 25 mg by mouth Two (2) times a day. Patient taking 1/2 tablet in morning per Dr Wonda Cheng) 60 tablet 3   ??? clopidogreL (PLAVIX) 75 mg tablet Take 1 tablet (75 mg total) by mouth daily. 90 tablet 3   ??? empagliflozin (JARDIANCE) 10 mg tablet Take 1 tablet (10 mg total) by mouth daily. 30 tablet 11   ??? empty container (SHARPS-A-GATOR DISPOSAL SYSTEM) Misc Use as directed for sharps disposal 1 each 2   ??? evolocumab 140 mg/mL PnIj Inject the contents of 1 pen (140 mg) under the skin every fourteen (14) days. 4 mL 11   ??? furosemide (LASIX) 20 MG tablet If needed for weight gain > 2 lbs in one day or 5 lbs in on eweek 90 tablet 3   ??? metFORMIN (GLUCOPHAGE) 1000 MG tablet Take 1,000 mg by mouth 2 (two) times a day with meals.      ??? sacubitriL-valsartan (ENTRESTO) 24-26 mg tablet Take 1 tablet by mouth Two (2) times a day. 60 tablet 11   ??? sertraline (ZOLOFT)  50 MG tablet Take 50 mg by mouth daily.     ??? sodium zirconium cyclosilicate (LOKELMA) 5 gram PwPk packet Take 1 packet (5 g total) by mouth 3 (three) times a week. 30 packet 1   ??? spironolactone (ALDACTONE) 25 MG tablet Take 0.5 tablets (12.5 mg total) by mouth nightly. 15 tablet 6     No current facility-administered medications for this visit.       Allergies/Adverse Events:  Allergies   Allergen Reactions   ??? Iodine Other (See Comments) and Hives          Lab Results  Lab Results   Component Value Date    PRO-BNP 4,040.0 (H) 04/23/2020    PRO-BNP 4,710.0 (H) 04/10/2020    Creatinine 1.33 (H) 11/18/2020    Creatinine 1.40 (H) 10/13/2020    Creatinine 1.33 (H) 01/19/2012    Creatinine 1.19 01/18/2012    BUN 25 (H) 11/18/2020    BUN 25 (H) 10/13/2020    BUN 33 (H) 01/19/2012    BUN 29 (H) 01/18/2012    Potassium 5.0 (H) 11/18/2020    Potassium 5.1 (H) 10/13/2020    Potassium 4.5 01/19/2012    Potassium 4.7 01/18/2012    Magnesium 2.8 (H) 04/23/2020    Magnesium 2.5 (H) 04/16/2020    Magnesium 2.2 01/18/2012    AST 33 04/25/2020    AST 33 04/10/2020    AST 28 01/18/2012    ALT 31 04/25/2020    ALT 42 04/10/2020    ALT 29 01/18/2012    Total Bilirubin 0.9 04/25/2020    Total Bilirubin 0.5 04/10/2020    Total Bilirubin 0.5 01/18/2012    INR 1.08 04/10/2020    INR 1.0 01/18/2012    WBC 5.4 08/04/2020    WBC 10.6 01/19/2012    HGB 14.6 11/18/2020    HGB 12.6 (L) 01/19/2012    HCT 43.4 11/18/2020    HCT 36.8 (L) 01/19/2012    Platelet 169 08/04/2020    Platelet 141 (L) 01/19/2012    Cholesterol, Total 189 05/17/2012    Triglycerides 215 (H) 05/17/2012    HDL 34 (L) 05/17/2012    Non HDL Chol.  155 05/17/2012    LDL Cholesterol, Calculated 112 05/17/2012         Vital Signs:  There were no vitals filed for this visit.    BP Readings from Last 3 Encounters:   11/18/20 121/67   10/31/20 115/53   08/15/20 110/80     Pulse Readings from Last 3 Encounters:   11/18/20 75   10/31/20 57   08/15/20 59     Wt Readings from Last 3 Encounters:   11/18/20 92.9 kg (204 lb 12.8 oz)   10/31/20 95.3 kg (210 lb)   08/15/20 96.9 kg (213 lb 9.6 oz)      BMI Readings from Last 3 Encounters:   11/18/20 25.60 kg/m??   10/31/20 26.96 kg/m??   08/15/20 26.70 kg/m??

## 2020-11-20 ENCOUNTER — Encounter: Admit: 2020-11-20 | Discharge: 2020-11-21 | Payer: MEDICARE | Attending: Pharmacotherapy | Primary: Pharmacotherapy

## 2020-11-20 DIAGNOSIS — I5022 Chronic systolic (congestive) heart failure: Principal | ICD-10-CM

## 2020-11-20 MED ORDER — CARVEDILOL 25 MG TABLET
ORAL_TABLET | 3 refills | 0 days
Start: 2020-11-20 — End: ?

## 2020-11-20 MED ORDER — ENTRESTO 24 MG-26 MG TABLET
ORAL_TABLET | 11 refills | 0 days
Start: 2020-11-20 — End: 2020-12-10

## 2020-11-20 MED ORDER — SPIRONOLACTONE 25 MG TABLET
ORAL_TABLET | ORAL | 3 refills | 90 days | Status: CP
Start: 2020-11-20 — End: ?

## 2020-11-20 NOTE — Unmapped (Signed)
It was good speaking with you today.    As discussed:  ??? Change spironolactone to 25 mg (one full tablet) every other night  ??? Continue other medications  ??? Make sure medication list matches medications at home  ??? Monitor and log weights, blood pressure and heart rate  ??? Avoid NSAIDs (such as ibuprofen or naproxen)  ??? Next phone visit 12/30/20 at 1pm  ??? Please let us know if you have any issues        Call the clinic at [phone] with questions.    Our clinic fax number is [phone].    If you need to reschedule future appointments, please call [phone] or [phone]    After office hours, if you have urgent questions/problems, contact the on-call cardiologist through the hospital operator: [phone].      ENTRESTOTM    Generic: Valsartan and Sacubitril     What is EntrestoTM?                ??? EntrestoTM is a prescription medicine for people with heart failure due to a weak heart.  ??? EntrestoTM helps some patients live longer, feel better, and stay out of the hospital.  ??? It is usually used in combination with other heart failure medicines.     How does EntrestoTM work?                ??? EntrestoTM makes your blood vessels wider which decreases the workload of the heart.   ??? Decreases the amount of fluid in the body    Ask you healthcare provider if EntrestoTM would be a good medicine for you.      How should I take EntrestoTM?   ??? Take EntrestoTM exactly as your doctor tells you to take it.  ??? Take EntrestoTM two times each day 12 hours apart.   ??? Your doctor may change your dose of EntrestoTM during treatment  ??? If you miss a dose, take it as soon as you remember. If it is close to your next dose, do not take the missed dose. Take the next dose at the regular time.        What are the possible side effects?  ??? Low blood pressure (hypotension). Call your doctor if you become dizzy or lightheaded, or develop extreme fatigue  ??? Kidney problems. Your doctor will check your kidneys prior to treatment with EntrestoTM.  ??? Increased amount of potassium in your blood. Your doctor will monitor your potassium levels during treatment with EntrestoTM.   ??? Be aware of the following foods which are high in potassium:    Food Amount Potassium (milligrams)   Baked potato, with skin 1 medium 925   Soy milk 1 cup (8 oz) 600   White beans, canned 1/2 cup 595   Avocado 1/2 fruit 487   Fish: halibut, tuna, cod, snapper 3 oz 480   Swiss chard 1/2 cup, cooked 480   Banana 1 medium 425   Spinach 1/2 cup cooked 420   Papaya 1 small 391   Milk (fat-free, low-fat, whole, buttermilk) 1 cup (8 oz) 350-380   Lima beans 1/2 cup 353   Artichoke, cooked 1 medium  343   Tomato or vegetable juice 1/2 cup (4 oz) 275   Dates 5 pieces 270   Raisins 1/4 cup (2 oz) 270   Potato, boiled 1/2 cup 255   Brussels sprouts 1/2 cup 255   [place] 3 ounces 250   Sunflower or pumpkin seeds  1 oz 240   Yogurt 1/2 cup (4 oz) 238   Orange 1 fruit 237   Broccoli 1/2 cup 230   Cantaloupe 1/2 cup 215   Nuts: almonds, peanuts, hazelnuts, [place], cashew, mixed 1 oz 200   Tuna fish, canned 3 oz 200     ??? Rarely, serious allergic reactions causing swelling of your face, lips, tongue, and throat (angioedema) that may cause trouble breathing and death.     These are not all the side effects of EntrestoTM. You may report side effects to the FDA at 1-800-FDA1088.    How to get help paying for Entresto?  EntrestoTM [employer] is here to help save on the out-of-pocket costs. A free trial offer program is available for everyone with a valid prescription for EntrestoTM. For those with commercial insurance, a co-pay card may further reduce out-of-pocket costs. For those with Medicare, state programs, or uninsured, the company will work with you and check your eligibility for any other money saving programs.

## 2020-11-24 NOTE — Unmapped (Signed)
Forms from [employer] applications scanned into chart

## 2020-12-03 MED ORDER — ACCU-CHEK SOFTCLIX LANCETS
0 days
Start: 2020-12-03 — End: ?

## 2020-12-03 MED ORDER — ACCU-CHEK GUIDE TEST STRIPS
0.00000 days
Start: 2020-12-03 — End: ?

## 2020-12-09 NOTE — Unmapped (Signed)
[facility] Refill Coordination Note    Specialty Medication(s) to be Shipped:   General Specialty: Repatha    Other medication(s) to be shipped: No additional medications requested for fill at this time     Douglas Beltran, DOB: August 10, 1941  Phone: There are no phone numbers on file.      All above HIPAA information was verified with patient.     Was a Nurse, learning disability used for this call? No    Completed refill call assessment today to schedule patient's medication shipment from the [facility] [phone]).       Specialty medication(s) and dose(s) confirmed: Regimen is correct and unchanged.   Changes to medications: [name] reports no changes at this time.  Changes to insurance: No  Questions for the pharmacist: No    Confirmed patient received Welcome Packet with first shipment. The patient will receive a drug information handout for each medication shipped and additional FDA Medication Guides as required.       DISEASE/MEDICATION-SPECIFIC INFORMATION        For patients on injectable medications: Patient currently has 1 doses left.  Next injection is scheduled for 12/13/20.    SPECIALTY MEDICATION ADHERENCE     Medication Adherence    Patient reported X missed doses in the last month: 0  Specialty Medication: Repatha 140mg /ml  Patient is on additional specialty medications: No  Patient is on more than two specialty medications: No                Repatha 140 mg/ml: 4 days of medicine on hand         SHIPPING     Shipping address confirmed in Epic.     Delivery Scheduled: Yes, Expected medication delivery date: 12/23/20.     Medication will be delivered via Same Day Courier to the prescription address in Epic WAM.    [name] [facility] Specialty Technician

## 2020-12-10 MED ORDER — ENTRESTO 24 MG-26 MG TABLET: tablet | 11 refills | 0 days

## 2020-12-10 MED ORDER — ENTRESTO 24 MG-26 MG TABLET
ORAL_TABLET | 11 refills | 0.00000 days | Status: CP
Start: 2020-12-10 — End: 2020-12-10

## 2020-12-17 ENCOUNTER — Encounter: Admit: 2020-12-17 | Discharge: 2020-12-18 | Payer: MEDICARE

## 2020-12-17 DIAGNOSIS — E782 Mixed hyperlipidemia: Principal | ICD-10-CM

## 2020-12-17 DIAGNOSIS — I255 Ischemic cardiomyopathy: Principal | ICD-10-CM

## 2020-12-17 DIAGNOSIS — I2510 Atherosclerotic heart disease of native coronary artery without angina pectoris: Principal | ICD-10-CM

## 2020-12-17 NOTE — Unmapped (Signed)
DIVISION OF CARDIOLOGY   [facility] [employer] [place]        Date of Service: 12/17/2020      PCP: Referring Provider:   Marguarite Arbour, MD  [address] [facility] [place] [address]  Phone: [phone]  Fax: [phone] Rica Records, MD  [address]  [facility] [place],  [place] [address]  Phone: [phone]  Fax: [phone]       Impression and Plan:  Douglas Beltran is a 80 y.o. 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    1. CAD s/p PCI to proximal LAD and mid RCA in 2021, and distal RCA in 2013   -He is doing extremely well, we will continue dual antiplatelet therapy for 1 year.  He can stop his clopidogrel in May 2022.      2.  Ischemic cardiomyopathy - overall he is euvolemic at this time.   - Repeat his echocardiogram 60 days following his PCI showed EF continue to be decreased at 15-20%   -Cont BB, SGLT2i, and ARNI   - now s/p biventricular ICD placement    3. Hyperlipidemia - continue on evolocumab, last LDL 32    Followup: No follow-ups on file.      History of Present Illness:  Douglas Beltran is a 80 y.o. 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     Douglas Beltran presents today with his wife.  Overall, he is doing very well.  He has had no further chest pain following his PCI.  His EF has had some functional recovery although it remains depressed.  Since being seen last he has had his BiV ICD placed.  He denies any chest pain and has had no hospitalizations for heart failure.    Past Medical History:  1.  CAD s/p PCI to proximal LAD, mid RCA, and distal RCA in 2013  2. HTN  3. HLD  4. T2DM  5. CKD  6. 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 CHF (congestive heart failure) (CMS-HCC)    ??? Coronary artery disease    ??? Diabetes mellitus (CMS-HCC)    ??? Hypertension 07/12/2014   ??? Macrocytic anemia    ??? Mixed hyperlipidemia 07/12/2014       Past Surgical History:  Past Surgical History:   Procedure Laterality Date   ??? ABDOMINAL SURGERY     ??? EYE SURGERY     ??? JOINT REPLACEMENT     ??? PR CATH PLACE/CORON ANGIO, IMG SUPER/INTERP,W LEFT HEART VENTRICULOGRAPHY N/A 04/15/2020    Procedure: CATH LEFT HEART CATHETERIZATION W INTERVENTION;  Surgeon: Rosana Hoes, MD;  Location: [facility] CATH;  Service: Cardiology   ??? PR INSJ/RPLCMT PERM DFB W/TRNSVNS LDS 1/DUAL CHMBR N/A 08/04/2020    Procedure: Implant Biventricular ICD System;  Surgeon: Dorcas Carrow, MD;  Location: [facility] EP;  Service: Cardiology       Problem List:  Patient Active Problem List   Diagnosis   ??? Coronary artery disease involving native coronary artery of native heart without angina pectoris   ??? Hypertension   ??? Mixed hyperlipidemia   ??? PVC (premature ventricular contraction)   ??? Ischemic cardiomyopathy   ??? Macrocytic anemia   ??? NSTEMI (non-ST elevated myocardial infarction) (CMS-HCC)   ??? History of total bilateral knee replacement   ??? Chronic systolic heart failure (CMS-HCC)   ??? Type 2 diabetes mellitus without complication (CMS-HCC)       Most Recent Imaging:    LHC 04/15/20  PCI was performed of the??99%??heavily calcificed Left Main and??LAD. Rotational atherectomy was performed using a 1.75 mm burr. PCI was performed with a 4.0x38 Xience (LM-LAD).??  PCI was performed of the??90%??heavily calcificed??RCA. Rotational atherectomy was performed using a 1.75 mm burr. PCI was performed with a 4.0x38 and 3.5x30 with rota (post to 4.5).   Excellent??angiographic result with TIMI 3??flow.??    Echo   06/18/20    1. Limited study to assess ventricular function.    2. The left ventricle is mildly dilated in size with mildly increased wall thickness.    3. The left ventricular systolic function is severely decreased, LVEF is visually estimated at 15-20%.    4. The inferior wall, basal inferolateral wall, and mid inferolateral wall are akinetic.    5. There is grade II diastolic dysfunction (elevated filling pressure).    6. The left atrium is mildly dilated in size.    7. The aortic valve is probably trileaflet with mildly thickened leaflets with normal excursion.    8. The right ventricle is mildly dilated in size, with normal systolic function.    04/11/20    1. The left ventricle is moderately dilated in size with mildly increased wall thickness.    2. The left ventricular systolic function is severely decreased, LVEF is visually estimated at 20-25%.    3. There is grade II diastolic dysfunction (elevated filling pressure).    4. The mitral valve leaflets are mildly thickened with normal leaflet mobility.    5. There is mild to moderate mitral valve regurgitation.    6. The left atrium is severely dilated in size.    7. The aortic valve is trileaflet with mildly thickened leaflets with normal excursion.    8. The right ventricle is mildly dilated in size, with low normal systolic function.    9. The right atrium is moderately dilated  in size.    10. IVC size and inspiratory change suggest mildly elevated right atrial pressure. (5-10 mmHg).    11. There is a trivial, anterior pericardial effusion.       Current Medications:  Current Outpatient Medications   Medication Sig Dispense Refill   ??? acetaminophen (TYLENOL) 500 MG tablet Take 500 mg by mouth every six (6) hours as needed for pain.     ??? amoxicillin (AMOXIL) 500 MG capsule TAKE 4 CAPSULES BY MOUTH 1 HOUR PRIOR TO APPOINTMENT     ??? aspirin (ECOTRIN) 81 MG tablet Take 81 mg by mouth daily. ??? atorvastatin (LIPITOR) 80 MG tablet Take 1 tablet (80 mg total) by mouth daily. 90 tablet 3   ??? calcium polycarbophiL (FIBERCON) 625 mg tablet Take 1,250 mg by mouth nightly.     ??? carvediloL (COREG) 25 MG tablet Take 1 tablet (25 mg) in the morning  and one-half tablet (12.5 mg) in the evening 60 tablet 3   ??? clopidogreL (PLAVIX) 75 mg tablet Take 1 tablet (75 mg total) by mouth daily. 90 tablet 3   ??? empagliflozin (JARDIANCE) 10 mg tablet Take 1 tablet (10 mg total) by mouth daily. 30 tablet 11   ??? empty container (SHARPS-A-GATOR DISPOSAL SYSTEM) Misc Use as directed for sharps disposal 1 each 2   ??? evolocumab 140 mg/mL PnIj Inject the contents of 1 pen (140 mg) under the skin every fourteen (14) days. 4 mL 11   ??? furosemide (LASIX) 20 MG tablet If needed for weight gain > 2 lbs in one day or 5 lbs in on eweek 90 tablet 3   ??? metFORMIN (GLUCOPHAGE) 1000 MG tablet Take 1,000 mg by mouth 2 (two) times a day with meals.      ??? sacubitriL-valsartan (ENTRESTO) 24-26 mg tablet Take 1 tablet in the morning and 1.5 tablets in the evening 60 tablet 11   ??? sertraline (ZOLOFT) 50 MG tablet Take 50 mg by mouth daily.     ??? sodium zirconium cyclosilicate (LOKELMA) 5 gram PwPk packet Take 1 packet (5 g total) by mouth 3 (three) times a week. 30 packet 1   ??? spironolactone (ALDACTONE) 25 MG tablet Take 1 tablet (25 mg total) by mouth every other day. Take at night 45 tablet 3     No current facility-administered medications for this visit.       Allergies:  Allergies   Allergen Reactions   ??? Iodine Other (See Comments) and Hives       Review Of Systems:  Twelve system review of systems was completed and was non-contributory except as noted above, in the history of present illness.    Family History:  His family history includes Diabetes in his father; Heart disease in his brother and father.    Social History:  He  reports that he has quit smoking. He has never used smokeless tobacco. No history on file for alcohol use and drug use.  Social History     Tobacco Use   Smoking Status Former Smoker   Smokeless Tobacco Never Used   Tobacco Comment    quit 31 years ago     Social History     Substance and Sexual Activity   Alcohol Use None     Social History     Substance and Sexual Activity   Drug Use Not on file       Physical Exam:    There were no vitals taken for this visit.    Wt Readings from Last 3 Encounters:   11/18/20 92.9 kg (204 lb 12.8 oz)   10/31/20 95.3 kg (210 lb)   08/15/20 96.9 kg (213 lb 9.6 oz)       General Appearance:  Alert, cooperative, no distress, appears stated age     HEENT:  Unremarkable   Neck: Carotids 2+ bilaterally, no carotid bruits,  No JVD, no HJR.  Central venous pressure is normal.        Lungs:   Clear to auscultation bilaterally, respirations unlabored     Heart:  Regular rate and rhythm  Normal S1 and S2   No murmurs/rubs/gallops     Abdomen:   Soft, non-tender, bowel sounds active,  no masses, no organomegaly     Extremities: Extremities normal, no cyanosis or edema.     Pulses: PT's 2+ symmetric bilaterally     Skin: No lower extremity rashes or  ulcers     Neurologic: Alert, interactive, and appropriate, grossly moving all 4 extremities        LABS:         Component Value Date/Time    PROBNP 4,040.0 (H) 04/23/2020 1427       Lab Results   Component Value Date    WBC 5.4 08/04/2020    HGB 14.6 11/18/2020    HCT 43.4 11/18/2020    PLT 169 08/04/2020       Lab Results   Component Value Date    NA 140 11/18/2020    K 5.0 (H) 11/18/2020    CL 107 11/18/2020    CO2 25.5 11/18/2020    BUN 25 (H) 11/18/2020    CREATININE 1.33 (H) 11/18/2020    GLU 87 11/18/2020    CALCIUM 10.5 (H) 11/18/2020    MG 2.8 (H) 04/23/2020    PHOS 3.1 01/18/2012       Lab Results   Component Value Date    ALBUMIN 4.7 04/25/2020    ALT 31 04/25/2020    AST 33 04/25/2020    INR 1.08 04/10/2020         Scribe Attestation:         This document serves as a record of the services and decisions performed by Molli Hazard A. Hessie Dibble, MD on 12/17/2020. It was created on his behalf by Inez Pilgrim, a trained medical scribe. The creation of this document is based on the provider's statements and observations that were conveyed to the medical scribe during the patient's encounter.     (The information in this document, created by the medical scribe for me, accurately reflects the services I personally performed and the decisions made by me. I have reviewed and approved this document for accuracy.)    I personally spent 25 minutes face-to-face and non-face-to-face in the care of this patient, which includes all pre, intra, and post visit time on the date of service.      Artist Pais Hessie Dibble, MD, MPH  Assistant Professor of Medicine  [employer] at [facility]  [email or web address]   -    Office [phone]

## 2020-12-17 NOTE — Unmapped (Signed)
No changes to medciation  Stop DAPT in 04/2021

## 2020-12-18 ENCOUNTER — Encounter: Admit: 2020-12-18 | Discharge: 2020-12-19 | Payer: MEDICARE

## 2020-12-18 DIAGNOSIS — Z4502 Encounter for adjustment and management of automatic implantable cardiac defibrillator: Principal | ICD-10-CM

## 2020-12-18 DIAGNOSIS — Z45018 Encounter for adjustment and management of other part of cardiac pacemaker: Principal | ICD-10-CM

## 2020-12-19 ENCOUNTER — Encounter
Admit: 2020-12-19 | Discharge: 2020-12-20 | Payer: MEDICARE | Attending: Cardiovascular Disease | Primary: Cardiovascular Disease

## 2020-12-19 ENCOUNTER — Institutional Professional Consult (permissible substitution): Admit: 2020-12-19 | Discharge: 2020-12-20 | Payer: MEDICARE

## 2020-12-19 DIAGNOSIS — Z4502 Encounter for adjustment and management of automatic implantable cardiac defibrillator: Principal | ICD-10-CM

## 2020-12-19 DIAGNOSIS — I5042 Chronic combined systolic (congestive) and diastolic (congestive) heart failure: Principal | ICD-10-CM

## 2020-12-19 MED ORDER — ENTRESTO 24 MG-26 MG TABLET
ORAL_TABLET | 11 refills | 0 days | Status: CP
Start: 2020-12-19 — End: 2020-12-22

## 2020-12-19 NOTE — Unmapped (Signed)
[facility] Heart Failure Monitoring Device Report    Patient:  Douglas Beltran  Date:  December 19, 2020    Device:   Medtronic - [place] HF cardiologist:   Dr. Johann Capers EP cardiologist:   [facility]   Other cardiologist:  none     PCP:  JEFFREY Rodena Medin, MD      30 day Report:  There were no readings that exceeded the thresholds.    Optivol device detected intrathoracic impedance that was at baseline, suggesting optimal fluid status.        Please see full interrogation report under Media tab for this encounter.     I reviewed the device-generated report and agree with findings.      Recommendation:    Continue monthly monitoring.      Hope Budds, NP

## 2020-12-19 NOTE — Unmapped (Signed)
HF Clinic New Patient/Consult Note    Referring Provider: Rica Records, *   Rica Records, MD  [address]  [facility] [place],  [place] [address]   Primary Provider: Marguarite Arbour, MD   [address] [facility] [place] [address]       Reason for Visit:  Douglas Beltran is a 80 y.o. male with history of severe three-vessel CAD, type 2 diabetes, HLD, MDD referred by Rica Records, * for management of systolic and diastolic heart failure.    Assessment & Plan:    1.  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  Recently admitted to med D in May of this year with NSTEMI found to have worsening HFrEF with EF of 15-20%, grade 2 diastolic dysfunction most recently on echo on 06/18/2020.  Recently underwent BiV ICD placement on 08/04/2020.  Overall well compensated, walked around the mall recently with minimal dyspnea on exertion.  Euvolemic on exam today.      - Increase entresto to 1.5 tablets in the morning and 1.5 tablets in the evening.  - continue Lasix to 20 mg every other day  - Continue taking 1/2 the coreg pill in the morning and a full pill at night   - Continue Jardiance 10 mg daily  ??? Continue spironolactone 25 mg every other day    2. Coronary artery disease: Diagnosed with multivessel disease in 2013 with subsequent PCI.  NSTEMI with PCI to proximal LAD and mid RCA in May 2021.  No symptoms of angina at this time.  ???Continue aspirin, Plavix, atorvastatin 80 mg  ???On evolocumab    Return in about 3 months (around 03/19/2021) for Recheck.    Carin Hock, MD PhD  Advanced Heart Failure, Mechanical Circulatory Support and Transplant Cardiology      History of Present Illness:  Douglas Beltran with history of severe three-vessel CAD, type 2 diabetes, HLD, MDDis being seen at the request of Rica Records, * for heart failure management. The patient's cardiac history started 2013 when he presented to [place] with shortness of breath and chest tightness was found to have extensive CAD.  At the time was referred for CABG, but patient opted for PCI with placement of stents to 80% proximal mid LAD lesion, 70% mid RCA lesion, and 80% distal RCA stenosis.  At approximately the same time he was diagnosed with ischemic cardiomyopathy, at the time EF 30-35%.  He had been in his usual state of health until May of this year.  While walking around the mall, had significant shortness of breath.  Over the following days his shortness of breath became progressively worse in addition to the start of chest tightness and pressure which prompted him to go to the urgent care, and subsequently sent to the emergency room at [facility].  At that time he underwent left and right heart cath which showed severe three-vessel disease, and an echo done at that time showed an EF of 15-20%.  He was subsequently referred to [facility] for CABG eval versus complex PCI.      Patient had cardiac MRI done on 5 3 which showed a severe wall motion abnormalities, EF approximately 19%, and significant scarring of apical septal wall but otherwise had viable cardiac tissue, but was deemed to high risk for CABG.  He underwent PCI with 2  DES placed to LM/LAD and RCA.  Since his hospitalization, patient has undergone placement of a BiV ICD.  Repeat echo 2 months following PCI showed similar results to prior, EF of 15-20%, grade 2 diastolic dysfunction.    Over this time since discharge, patient has been able to take walks around the mall but slower than previously.  Does have some dyspnea on exertion when he walks too fast, or walks for an extended period of time around the mall.  No chest tightness or pressure with exertion.  Denies any lower extremity edema.  Lies flat at night with one pillow.  Reports some intermittent dizziness with standing up in the morning, no syncopal events.    Interval Hx:  He has been feeling quite well since last time we saw him. No issues with volume and walking around comfortably. No orthopnea or PND. Has been doing cardiac rehab which has helped. No chest pain.         Cardiovascular History & Procedures:  Cath / PCI:  PCI 04/15/20:  PCI was performed of the??99%??heavily calcificed Left Main and??LAD. Rotational atherectomy was performed using a 1.75 mm burr. PCI was performed with a 4.0x38 Xience (LM-LAD).??  ??  PCI was performed of the??90%??heavily calcificed??RCA. Rotational atherectomy was performed using a 1.75 mm burr. PCI was performed with a 4.0x38 and 3.5x30 with rota (post to 4.5).   ??  Excellent??angiographic result with TIMI 3??flow.??    CV Surgery: None    EP Procedures and Devices:  BiV ICD placed 08/04/20    Non-Invasive Evaluation(s):  Echo:  06/18/20:  Summary    1. Limited study to assess ventricular function.    2. The left ventricle is mildly dilated in size with mildly increased wall  thickness.    3. The left ventricular systolic function is severely decreased, LVEF is  visually estimated at 15-20%.    4. The inferior wall, basal inferolateral wall, and mid inferolateral wall  are akinetic.    5. There is grade II diastolic dysfunction (elevated filling pressure).    6. The left atrium is mildly dilated in size.    7. The aortic valve is probably trileaflet with mildly thickened leaflets  with normal excursion.    8. The right ventricle is mildly dilated in size, with normal systolic  function.    CT/MRI/Nuclear Tests:  Cardiac MRI 04/14/20:    Left ventricle is dilated (index LVEDV is 191 mL/m2) and there are severe wall motion abnormalities with reduced ejection fraction (LVEF is 19%).  ??  There is scarring of the apical septal wall involving 50-75% thickness. Otherwise, no other areas of myocardial scarring/fibrosis can be convincingly identified.  ??  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     Past Medical History:   Diagnosis Date   ??? CHF (congestive heart failure) (CMS-HCC)    ??? Coronary artery disease    ??? Diabetes mellitus (CMS-HCC)    ??? Hypertension 07/12/2014   ??? Macrocytic anemia    ??? Mixed hyperlipidemia 07/12/2014        Past Surgical History:   Procedure Laterality Date   ??? ABDOMINAL SURGERY     ??? EYE SURGERY     ??? JOINT REPLACEMENT     ??? PR CATH PLACE/CORON ANGIO, IMG SUPER/INTERP,W LEFT HEART VENTRICULOGRAPHY N/A 04/15/2020    Procedure: CATH LEFT HEART CATHETERIZATION W INTERVENTION;  Surgeon: Rosana Hoes, MD;  Location: [facility] CATH;  Service: Cardiology   ??? PR INSJ/RPLCMT  PERM DFB W/TRNSVNS LDS 1/DUAL CHMBR N/A 08/04/2020    Procedure: Implant Biventricular ICD System;  Surgeon: Dorcas Carrow, MD;  Location: [facility] EP;  Service: Cardiology         Allergies:  Iodine    Current Medications:  Current Outpatient Medications   Medication Sig Dispense Refill   ??? ACCU-CHEK GUIDE TEST STRIPS Strp TEST BLOOD SUGAR TWICE DAILY AS INSTRUCTED     ??? ACCU-CHEK SOFTCLIX LANCETS lancets USE AS DIRECTED TWICE DAILY     ??? acetaminophen (TYLENOL) 500 MG tablet Take 500 mg by mouth every six (6) hours as needed for pain.     ??? amoxicillin (AMOXIL) 500 MG capsule TAKE 4 CAPSULES BY MOUTH 1 HOUR PRIOR TO APPOINTMENT     ??? aspirin (ECOTRIN) 81 MG tablet Take 81 mg by mouth daily.      ??? atorvastatin (LIPITOR) 80 MG tablet Take 1 tablet (80 mg total) by mouth daily. 90 tablet 3   ??? calcium polycarbophiL (FIBERCON) 625 mg tablet Take 1,250 mg by mouth nightly.     ??? carvediloL (COREG) 25 MG tablet Take 1 tablet (25 mg) in the morning and one-half tablet (12.5 mg) in the evening (Patient taking differently: Take 1/2 tablet (12.5 mg) in the morning and one tablet (25 mg) in the evening) 60 tablet 3   ??? clopidogreL (PLAVIX) 75 mg tablet Take 1 tablet (75 mg total) by mouth daily. 90 tablet 3   ??? empagliflozin (JARDIANCE) 10 mg tablet Take 1 tablet (10 mg total) by mouth daily. 30 tablet 11   ??? empty container (SHARPS-A-GATOR DISPOSAL SYSTEM) Misc Use as directed for sharps disposal 1 each 2   ??? evolocumab 140 mg/mL PnIj Inject the contents of 1 pen (140 mg) under the skin every fourteen (14) days. 4 mL 11   ??? furosemide (LASIX) 20 MG tablet If needed for weight gain > 2 lbs in one day or 5 lbs in on eweek 90 tablet 3   ??? metFORMIN (GLUCOPHAGE) 1000 MG tablet Take 1,000 mg by mouth 2 (two) times a day with meals.      ??? sertraline (ZOLOFT) 50 MG tablet Take 50 mg by mouth daily.     ??? sodium zirconium cyclosilicate (LOKELMA) 5 gram PwPk packet Take 1 packet (5 g total) by mouth 3 (three) times a week. 30 packet 1   ??? spironolactone (ALDACTONE) 25 MG tablet Take 1 tablet (25 mg total) by mouth every other day. Take at night 45 tablet 3   ??? sacubitriL-valsartan (ENTRESTO) 24-26 mg tablet Take 1.5 tablet in the morning and 1.5 tablets in the evening 180 tablet 6     No current facility-administered medications for this visit.       Family History:  There is no family history of premature coronary artery disease or sudden cardiac death.    Social history:  Former smoker, quit 31 years ago    Review of Systems:  A full review of 10 systems is unremarkable except as stated in the HPI.     Physical Exam:  VITAL SIGNS:   Vitals:    12/19/20 0918   BP: 121/66   Pulse: 60   SpO2: 94%      Wt Readings from Last 3 Encounters:   12/19/20 95.5 kg (210 lb 9.6 oz)   12/17/20 95.3 kg (210 lb)   11/18/20 92.9 kg (204 lb 12.8 oz)      Today's Body mass index is 26.32 kg/m??.  GENERAL:  Well-appearing in no acute distress  HEENT: Normocephalic and atraumatic with anicteric sclerae    NECK: Supple, without lymphadenopathy or thyromegaly. JVP not elevated. There are no carotid bruits  CARDIOVASCULAR: RRR, S1/S2 heard, no S3, no murmurs or gallops  RESPIRATORY: Clear to auscultation without wheezes or crackles.   ABDOMEN: Soft, non-tender, non-distended with audible bowel sounds. There is no organomegaly or palpable pulsatile mass.   EXTREMITIES:  Lower extremities are warm and without edema. Distal pulses are full and symmetric.  SKIN: No rashes, ecchymosis or petechiae.  NEURO: Alert, pleasant, and appropriate. Non-focal neuro exam    Pertinent Laboratory Studies:   Lab Results   Component Value Date    PRO-BNP 4,040.0 (H) 04/23/2020    PRO-BNP 4,710.0 (H) 04/10/2020    Creatinine 1.33 (H) 11/18/2020    Creatinine 1.40 (H) 10/13/2020    Creatinine 1.33 (H) 01/19/2012    Creatinine 1.19 01/18/2012    BUN 25 (H) 11/18/2020    BUN 25 (H) 10/13/2020    BUN 33 (H) 01/19/2012    BUN 29 (H) 01/18/2012    Potassium 5.0 (H) 11/18/2020    Potassium 5.1 (H) 10/13/2020    Potassium 4.5 01/19/2012    Potassium 4.7 01/18/2012    Magnesium 2.8 (H) 04/23/2020    Magnesium 2.5 (H) 04/16/2020    Magnesium 2.2 01/18/2012    AST 33 04/25/2020    AST 33 04/10/2020    AST 28 01/18/2012    ALT 31 04/25/2020    ALT 42 04/10/2020    ALT 29 01/18/2012    Total Bilirubin 0.9 04/25/2020    Total Bilirubin 0.5 04/10/2020    Total Bilirubin 0.5 01/18/2012    INR 1.08 04/10/2020    INR 1.0 01/18/2012    WBC 5.4 08/04/2020    WBC 10.6 01/19/2012    HGB 14.6 11/18/2020    HGB 12.6 (L) 01/19/2012    HCT 43.4 11/18/2020    HCT 36.8 (L) 01/19/2012    Platelet 169 08/04/2020    Platelet 141 (L) 01/19/2012    Triglycerides 215 (H) 05/17/2012    HDL 34 (L) 05/17/2012    Non HDL Chol.  155 05/17/2012    LDL Cholesterol, Calculated 112 05/17/2012       Other pertinent records were reviewed.    Pertinent Test Results from Today:  ECG: A-Sensed, V-paced without other abnormal findings

## 2020-12-19 NOTE — Unmapped (Signed)
Today,    MEDICATIONS:  We are changing your medications today.  Increase entresto to 1.5 tablets in the morning and 1.5 tablets in the evening.   Call if you have questions about your medications.    LABS:  We will call you if your labs need attention.    NEXT APPOINTMENT:  Return to clinic in 3 months with me    In general, to take care of your heart failure:  -Limit your fluid intake to 2 Liters (half-gallon) per day.    -Limit your salt intake to ideally 2-3 grams (2000-3000 mg) per day.  -Weigh yourself daily and record, and bring that weight diary to your next appointment.  (Weight gain of 2-3 pounds in 1 day typically means fluid weight.)    The medications for your heart are to help your heart and help you live longer.    Please contact us before stopping any of your heart medications.    Call the clinic at [phone] with questions.  Our clinic fax number is [phone].  If you need to reschedule future appointments, please call [phone] or [phone]  My office number (c/o De Burrs RN) is [phone] if you need further assistance.  After office hours, if you have urgent questions/problems, contact the on-call cardiologist through the hospital operator: [phone].    To learn more about heart failure, please read [facility] Learning to Live with Heart Failure:  [email or web address].    Available online at   [email or web address] - then click the link Learning to Live with Heart Failure patient booklet    OR    [email or web address] - open the window for Medical Management and click the link Living with Heart Failure

## 2020-12-20 NOTE — Unmapped (Signed)
[facility]  Ep Remote Monitoring   Tel:  [phone]  Fax: [phone]    Visit Date:  12/18/2020    Findings: Normal device function, Tested Lead measurements stable and within normal range, Adequate battery reserve-9.5 years    Arrhythmias: No significant new atrial or ventricular arrhythmias    Plan: Continue Routine Remote Monitoring     Last clinic appointment:08/13/2020    Manufacturer of Device: Medtronic       Type of Device: CRT-D / Bi-Ventricular Defibrillator  See scanned/downloaded PDF report for model numbers, serial numbers, and date(s) of implant.    Presenting Rhythm: AP/Bi-VP    ______________________________________________________________________  Percentage Biventricular Pacing: 97 %    Heart Failure Monitoring: Optivol  No Evidence of Fluid Accumulation  ______________________________________________________________________      Please see downloaded PDF file of transmission under Media Tab  for full details of device interrogation to include, when applicable,  battery status/charge time, lead trend data, and programmed parameters.

## 2020-12-20 NOTE — Unmapped (Signed)
All appointments with [facility] EP REMOTE MONITORING occur from home.  You do not come in to the office or hospital for these visits.        Your Device data report from this visit can be found in your Mychart by following the steps below.  Step #1-Select Menu then document center    Step #2-Select my documents    Step #3-Select Device checks    Please contact us with any questions.    Thank you,    [facility] EP REMOTE MONITORING [facility]  Phone. [phone]  Fax. [phone]  Email. [email or web address]

## 2020-12-22 MED ORDER — ENTRESTO 24 MG-26 MG TABLET
ORAL_TABLET | 6 refills | 0.00000 days | Status: CP
Start: 2020-12-22 — End: 2021-01-09

## 2020-12-23 DIAGNOSIS — I214 Non-ST elevation (NSTEMI) myocardial infarction: Principal | ICD-10-CM

## 2020-12-24 NOTE — Unmapped (Signed)
Patient called back and we have confirmed the delivery date as 12/25/2020 via same day courier.

## 2020-12-24 NOTE — Unmapped (Signed)
Douglas Beltran 's Repatha shipment will be sent out  as a result of prior authorization now approved.     I have reached out to the patient and left a voicemail message.  We will wait for a call back from the patient to reschedule the delivery.  We have not confirmed the new delivery date.

## 2020-12-25 MED FILL — REPATHA SURECLICK 140 MG/ML SUBCUTANEOUS PEN INJECTOR: SUBCUTANEOUS | 56 days supply | Qty: 4 | Fill #3

## 2020-12-30 ENCOUNTER — Encounter: Admit: 2020-12-30 | Discharge: 2020-12-31 | Payer: MEDICARE | Attending: Pharmacist | Primary: Pharmacist

## 2020-12-30 DIAGNOSIS — I5042 Chronic combined systolic (congestive) and diastolic (congestive) heart failure: Principal | ICD-10-CM

## 2020-12-30 NOTE — Unmapped (Addendum)
It was good speaking with you today.    As discussed:  ??? As previously instructed by Dr. Wonda Cheng, please increase your dose of Entresto 24-26 mg  to 1.5 tablets twice daily.  - Get labs checked at any [facility] facility about 2 weeks after this dose increase   ??? Continue other medications  ??? Make sure medication list matches medications at home  ??? Monitor and log weights, blood pressure and heart rate  ??? Avoid NSAIDs (such as ibuprofen or naproxen  ??? Next phone visit 3 weeks  ??? Please let us know if you have any issues        Call the clinic at [phone] with questions.    Our clinic fax number is [phone].    If you need to reschedule future appointments, please call [phone] or [phone]    After office hours, if you have urgent questions/problems, contact the on-call cardiologist through the hospital operator: [phone].

## 2020-12-30 NOTE — Unmapped (Addendum)
[facility] CARDIOLOGY CLINIC  [address]  [place], [place] [address]  PHONE: [phone]  FAX: [phone]           CLINICAL PHARMACIST PRACTITIONER NOTE   HEART FAILURE    Referring Provider: Dr. Wonda Cheng  Cardiologist: Dr. Wonda Cheng, Dr. Hessie Dibble, and Dr. Herbert Deaner  Primary Care Provider: Marguarite Arbour, MD     Assessment and Plan:     #HFrEF (LVEF 15-20%) with BiV ICD placement on 07/29/20. Today he reports NYHA class I-II symptoms, with stable BP, HR, and weights. He continues to endorse intermittent, positional lightheadedness but denies falls, syncope, or presyncope. His Optivol device was checked on 12/19/20 and showed no readings that exceeded thresholds, suggesting optimal fluid status. He denies any swelling and has not taken any doses of PRN Lasix for several weeks. Overall he is feeling well and is without complaint. Despite taking Lokelma 5 grams three times weekly, his potassium has not improved much (K 4.8-5.0 mmol/L). We discussed increasing the dose of Lokelma to 5 grams 4-5 times weekly. However, Douglas Beltran wanted to check his potassium level again before making any changes to the [facility]. At his last visit with Dr. Wonda Cheng on 12/19/20, Douglas Beltran was increased to 1.5 tablets BID. He is waiting for the delivery of this new prescription and plans to implement this dose increase as soon as it arrives by mail. Today, we reached a shared decision to not make any additional medication changes until his potassium level can be re-assessed ~1-2 weeks following his Entresto dose increase.  Douglas Beltran verbalized understanding of the above plan and was given an opportunity to ask questions, all of which were answered.  ?? Increase Entresto 24/26 mg to 1.5 tablets twice daily when this new prescription arrives by mail.  ?? Continue spironolactone 25 mg every other day, carvedilol 12.5 mg in the AM and 25 mg in the PM, empagliflozin (Jardiance) 10 mg daily and furosemide 20 mg PRN   ?? Of note, Douglas Beltran was previously taking carvedilol 25 mg BID but self-adjusted to 12.5 mg qAM and 25 mg qPM due to worsening dizziness.  ?? May consider titrating sacubitril/valsartan Sherryll Burger) and/or spironolactone at a future visit pending potassium recheck and BP  ?? Monitor and log weights, blood pressure, and heart rate  ?? Avoid NSAIDs   ?? Ferritin 30.8 ng/mL, TSAT 32%; HGB 14.6 g/dL  ?? Douglas Beltran may benefit from IV iron therapy. This will be discussed with Dr. Wonda Cheng.  ?? Most recent ECHO July 2021 (Repeat ECHO planned for April 2022)    #CAD - initially diagnosed with multivessel disease in 2013 with subsequent PCI (rather than CABG per patient decision). NSTEMI with PCI to proximal LAD and mid RCA in May 2021. He reports continuing to tolerate his Repatha injections.   ?? Key risk factors include: CAD, PCI, Hypertension, Diabetes (hemoglobin A1c 6.0%), BMI (25.6 kg/m2) and Lipids (LDL 32 mg/dL, triglycerides 75 mg/dL) (lipid panel and ZHY8M last checked on 09/01/20 per Care Everywhere)  ?? Continue aspirin 81 mg daily, clopidogrel 75 mg daily, atorvastatin 80 mg daily and evolocumab (Repatha) 140 mg every 2 weeks   ?? Continue DAPT until May 2022. Then will transition to single antiplatelet therapy.    #Diabetes - HbA1c 6.0% on 09/01/20 (Care Everywhere)  ?? Managed by PCP  ?? Continue empagliflozin 10 mg daily and metformin 1000 mg BID    #Medication Adherence & Access:   ??? Reviewed the indication, dose, and frequency of each medication  with patient   ??? Medications reviewed in EPIC medication station and updated today by the clinical pharmacist practitioner.  ??? Manufacturer's assistance APPROVED for: Entresto  ??? Manufacturer's assistance APPROVED for: Lokelma  ??? Manufacturer's assistance DENIED for: Jardiance     #FOLLOW UP:   ?? return to clinic to see cardiologist as scheduled with Dr. Wonda Cheng on 04/03/21   ?? repeat pharmacist phone visit in about 2-4 weeks   ?? BMP ordered for any [facility] facility. Pt will get labs at [facility] ~1-2 weeks after increasing Entresto dose          I spent a total of 25 minutes on the phone with the patient delivering clinical care and providing education/counseling.     The patient was physically located in [place] or a state in which I am permitted to provide care. The patient and/or parent/guardian understood that s/he may incur co-pays and cost sharing, and agreed to the telemedicine visit. The visit was reasonable and appropriate under the circumstances given the patient's presentation at the time.    The patient and/or parent/guardian has been advised of the potential risks and limitations of this mode of treatment (including, but not limited to, the absence of in-person examination) and has agreed to be treated using telemedicine. The patient's/patient's family's questions regarding telemedicine have been answered.     If the visit was completed in an ambulatory setting, the patient and/or parent/guardian has also been advised to contact their provider???s office for worsening conditions, and seek emergency medical treatment and/or call 911 if the patient deems either necessary.     Nada Boozer, PharmD  Cardiology Pharmacy Resident      Megan Elige Ko, PharmD, BCPS, [facility], CPP  Cardiology Clinical Pharmacist Practitioner  [facility]  [address]  [place], [place] [address]  Phone: [phone]  Fax: [phone]        History of Present Illness:   Douglas Beltran is a 80 y.o. year old male with a history of HFrEF, CKD, and diabetes who presents today for consideration of HF medication optimization at the request of Dr. Carin Hock.  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    Summary of Recent Visits and Key Medication Changes  ??? 08/04/20: BMP included SCr 1.54 mg/dL and K 4.8 mmol/L  ??? 12/15/06: Entresto 24-26 mg increased to 1 tablet in the morning and 1.5 tablets in the afternoon; furosemide decreased to 20 mg every other day  ??? 08/26/20: Carvedilol increased to 25 mg in the morning and 12.5 mg in the evening.   ?? 09/01/20: BMP (CareEverywhere) included K 4.6 mmol/L and SCr 1.3 mg/dL  ?? 09/10/20: furosemide decreased from 20 mg every other day to 20 mg only if needed. Spironolactone 12.5 mg in the evening started  ?? 09/18/20: BMP included K 5.2 mmol/L and SCr of 1.33  ?? 09/19/20: Carvedilol increased to 25 mg BID (two 12.5mg  tabs BID until this supply runs out, has received new Rx for 25 mg abs)  ?? 09/30/20: BMP included K 5.3 mmol/L and SCr 1.43 mg/dL  ?? 10/13/20: BMP included K 5.1 mmol/L and SCr 1.40 mg/dL  ?? 10/28/20: Lokelma 5 grams three times weekly started (first dose taken)  ?? 10/31/20: Lasix decreased to 20 mg every other day; carvedilol decreased to 25 mg in the AM and 12.5 mg in the PM due to dizziness  ?? 11/18/20: BMP with K 5.0 mmol/L, SCr 1.33 mg/dL; Ferritin 30.8 ng/mL, TSAT  32%; HGB 14.6 g/dL  ?? 11/25/20: BMP with K 4.8 mmol/L, Scr 1.4 mg/dL (per CareEverywhere)  ?? 12/19/20: Sherryll Burger increased to 1.5 tab (24-26 mg strength) BID by Dr. Wonda Cheng (dose adjustment NOT made yet, waiting on new RX to be delivered)      Interval History  He reports no active symptoms of heart failure. No dyspnea, orthopnea, paroxysmal nocturnal dyspnea, or LE edema. No palpitations, chest pain. He reports a good appetite without bloating, abd distension or early satiety. He reports laying flat at night without difficulty. He does report some dizziness when standing up quickly or bending over. Douglas Beltran continues to exercise at a local hospital gym 3x/week and walks two miles at the mall 4x/week. He is able to complete the entire two miles without experiencing SOB.    Blood pressure self-monitoring  He checks his BP regularly, with readings ranging from 105-134/55-79 mmHg. His HR has ranged 69-91s bpm. He denies feeling poorly with any of these readings     Weight self-monitoring  He weighs himself daily. His recent weights have ranged 197-200 lbs (down from ~200-204 lbs previously). He has not required PRN Lasix in several weeks. He denies signs/symptoms of dehydration or hypovolemia.      Cardiovascular History, Studies and Procedures:    Cath / PCI:  04/15/20 Left heart catheterization  1. PCI was performed of the??99%??heavily calcificed Left Main and??LAD. Rotational atherectomy was performed using a 1.75 mm burr. PCI was performed with a 4.0x38 Xience (LM-LAD).??  2. PCI was performed of the??90%??heavily calcificed??RCA. Rotational atherectomy was performed using a 1.75 mm burr. PCI was performed with a 4.0x38 and 3.5x30 with rota (post to 4.5).   3. Excellent??angiographic result with TIMI 3??flow    CV Surgery:     EP Procedures and Devices:     Non-Invasive Evaluation(s):   Echo:   06/18/20 ECHO Summary    1. Limited study to assess ventricular function.    2. The left ventricle is mildly dilated in size with mildly increased wall thickness.    3. The left ventricular systolic function is severely decreased, LVEF is visually estimated at 15-20%.    4. The inferior wall, basal inferolateral wall, and mid inferolateral wall are akinetic.    5. There is grade II diastolic dysfunction (elevated filling pressure).    6. The left atrium is mildly dilated in size.    7. The aortic valve is probably trileaflet with mildly thickened leaflets with normal excursion.     8. The right ventricle is mildly dilated in size, with normal systolic Function.     04/11/20 ECHO Summary    1. The left ventricle is moderately dilated in size with mildly increased wall thickness.    2. The left ventricular systolic function is severely decreased, LVEF is visually estimated at 20-25%.    3. There is grade II diastolic dysfunction (elevated filling pressure).    4. The mitral valve leaflets are mildly thickened with normal leaflet mobility.    5. There is mild to moderate mitral valve regurgitation.     6. The left atrium is severely dilated in size.    7. The aortic valve is trileaflet with mildly thickened leaflets with normal excursion.    8. The right ventricle is mildly dilated in size, with low normal systolic function.    9. The right atrium is moderately dilated  in size.    10. IVC size and inspiratory change suggest mildly elevated right atrial pressure. (5-10 mmHg).  11. There is a trivial, anterior pericardial effusion.    Cardiac CT/MRI/Nuclear Tests:     Cardiopulmonary Stress Tests:         Past Medical & Surgical History  Past Medical History:   Diagnosis Date   ??? CHF (congestive heart failure) (CMS-HCC)    ??? Coronary artery disease    ??? Diabetes mellitus (CMS-HCC)    ??? Hypertension 07/12/2014   ??? Macrocytic anemia    ??? Mixed hyperlipidemia 07/12/2014        Past Surgical History:   Procedure Laterality Date   ??? ABDOMINAL SURGERY     ??? EYE SURGERY     ??? JOINT REPLACEMENT     ??? PR CATH PLACE/CORON ANGIO, IMG SUPER/INTERP,W LEFT HEART VENTRICULOGRAPHY N/A 04/15/2020    Procedure: CATH LEFT HEART CATHETERIZATION W INTERVENTION;  Surgeon: Rosana Hoes, MD;  Location: [facility] CATH;  Service: Cardiology   ??? PR INSJ/RPLCMT PERM DFB W/TRNSVNS LDS 1/DUAL CHMBR N/A 08/04/2020    Procedure: Implant Biventricular ICD System;  Surgeon: Dorcas Carrow, MD;  Location: [facility] EP;  Service: Cardiology        Social & Family History  He  reports that he has quit smoking. He has never used smokeless tobacco.     Family History   Problem Relation Age of Onset   ??? Heart disease Father    ??? Diabetes Father    ??? Heart disease Brother         Medications:  Reviewed and updated. Medication list includes revisions made during today's encounter.    Current Outpatient Medications   Medication Sig Dispense Refill   ??? ACCU-CHEK GUIDE TEST STRIPS Strp TEST BLOOD SUGAR TWICE DAILY AS INSTRUCTED     ??? ACCU-CHEK SOFTCLIX LANCETS lancets USE AS DIRECTED TWICE DAILY     ??? acetaminophen (TYLENOL) 500 MG tablet Take 500 mg by mouth every six (6) hours as needed for pain.     ??? amoxicillin (AMOXIL) 500 MG capsule TAKE 4 CAPSULES BY MOUTH 1 HOUR PRIOR TO APPOINTMENT     ??? aspirin (ECOTRIN) 81 MG tablet Take 81 mg by mouth daily.      ??? atorvastatin (LIPITOR) 80 MG tablet Take 1 tablet (80 mg total) by mouth daily. 90 tablet 3   ??? calcium polycarbophiL (FIBERCON) 625 mg tablet Take 1,250 mg by mouth nightly.     ??? carvediloL (COREG) 25 MG tablet Take 1 tablet (25 mg) in the morning and one-half tablet (12.5 mg) in the evening (Patient taking differently: Take 1/2 tablet (12.5 mg) in the morning and one tablet (25 mg) in the evening) 60 tablet 3   ??? clopidogreL (PLAVIX) 75 mg tablet Take 1 tablet (75 mg total) by mouth daily. 90 tablet 3   ??? empagliflozin (JARDIANCE) 10 mg tablet Take 1 tablet (10 mg total) by mouth daily. 30 tablet 11   ??? empty container (SHARPS-A-GATOR DISPOSAL SYSTEM) Misc Use as directed for sharps disposal 1 each 2   ??? evolocumab 140 mg/mL PnIj Inject the contents of 1 pen (140 mg) under the skin every fourteen (14) days. 4 mL 11   ??? furosemide (LASIX) 20 MG tablet If needed for weight gain > 2 lbs in one day or 5 lbs in on eweek 90 tablet 3   ??? metFORMIN (GLUCOPHAGE) 1000 MG tablet Take 1,000 mg by mouth 2 (two) times a day with meals.      ??? sacubitriL-valsartan (ENTRESTO) 24-26 mg tablet Take 1.5 tablet in  the morning and 1.5 tablets in the evening 180 tablet 6   ??? sertraline (ZOLOFT) 50 MG tablet Take 50 mg by mouth daily.     ??? sodium zirconium cyclosilicate (LOKELMA) 5 gram PwPk packet Take 1 packet (5 g total) by mouth 3 (three) times a week. 30 packet 1   ??? spironolactone (ALDACTONE) 25 MG tablet Take 1 tablet (25 mg total) by mouth every other day. Take at night 45 tablet 3     No current facility-administered medications for this visit.       Allergies/Adverse Events:  Allergies   Allergen Reactions   ??? Iodine Other (See Comments) and Hives          Lab Results  Lab Results   Component Value Date    PRO-BNP 4,040.0 (H) 04/23/2020    PRO-BNP 4,710.0 (H) 04/10/2020    Creatinine 1.33 (H) 11/18/2020    Creatinine 1.40 (H) 10/13/2020    Creatinine 1.33 (H) 01/19/2012    Creatinine 1.19 01/18/2012    BUN 25 (H) 11/18/2020    BUN 25 (H) 10/13/2020    BUN 33 (H) 01/19/2012    BUN 29 (H) 01/18/2012    Potassium 5.0 (H) 11/18/2020    Potassium 5.1 (H) 10/13/2020    Potassium 4.5 01/19/2012    Potassium 4.7 01/18/2012    Magnesium 2.8 (H) 04/23/2020    Magnesium 2.5 (H) 04/16/2020    Magnesium 2.2 01/18/2012    AST 33 04/25/2020    AST 33 04/10/2020    AST 28 01/18/2012    ALT 31 04/25/2020    ALT 42 04/10/2020    ALT 29 01/18/2012    Total Bilirubin 0.9 04/25/2020    Total Bilirubin 0.5 04/10/2020    Total Bilirubin 0.5 01/18/2012    INR 1.08 04/10/2020    INR 1.0 01/18/2012    WBC 5.4 08/04/2020    WBC 10.6 01/19/2012    HGB 14.6 11/18/2020    HGB 12.6 (L) 01/19/2012    HCT 43.4 11/18/2020    HCT 36.8 (L) 01/19/2012    Platelet 169 08/04/2020    Platelet 141 (L) 01/19/2012    Cholesterol, Total 189 05/17/2012    Triglycerides 215 (H) 05/17/2012    HDL 34 (L) 05/17/2012    Non HDL Chol.  155 05/17/2012    LDL Cholesterol, Calculated 112 05/17/2012         Vital Signs:  There were no vitals filed for this visit.    BP Readings from Last 3 Encounters:   12/19/20 121/66   12/17/20 160/84   11/18/20 121/67     Pulse Readings from Last 3 Encounters:   12/19/20 60   12/17/20 60   11/18/20 75     Wt Readings from Last 3 Encounters:   12/19/20 95.5 kg (210 lb 9.6 oz)   12/17/20 95.3 kg (210 lb)   11/18/20 92.9 kg (204 lb 12.8 oz)      BMI Readings from Last 3 Encounters:   12/19/20 26.32 kg/m??   12/17/20 26.96 kg/m??   11/18/20 25.60 kg/m??

## 2020-12-31 NOTE — Unmapped (Signed)
I was the supervising physician in the delivery of the service. Carin Hock, MD

## 2021-01-09 MED ORDER — ENTRESTO 24 MG-26 MG TABLET
ORAL_TABLET | 3 refills | 0.00000 days | Status: CP
Start: 2021-01-09 — End: 2021-01-20

## 2021-01-10 NOTE — Unmapped (Signed)
Received a fax from [facility] stating that the most recent refill of entresto has a mismatched quantity and directions. Will forward to [facility] and Dr. Wonda Cheng

## 2021-01-19 ENCOUNTER — Encounter: Admit: 2021-01-19 | Discharge: 2021-01-19 | Payer: MEDICARE

## 2021-01-19 DIAGNOSIS — Z4502 Encounter for adjustment and management of automatic implantable cardiac defibrillator: Principal | ICD-10-CM

## 2021-01-20 ENCOUNTER — Encounter: Admit: 2021-01-20 | Discharge: 2021-01-21 | Payer: MEDICARE | Attending: Pharmacist | Primary: Pharmacist

## 2021-01-20 DIAGNOSIS — I5022 Chronic systolic (congestive) heart failure: Principal | ICD-10-CM

## 2021-01-20 MED ORDER — SODIUM ZIRCONIUM CYCLOSILICATE 5 GRAM ORAL POWDER PACKET
PACK | ORAL | 0 refills | 53 days
Start: 2021-01-20 — End: ?

## 2021-01-20 MED ORDER — ENTRESTO 24 MG-26 MG TABLET
ORAL_TABLET | 3 refills | 0 days | Status: CP
Start: 2021-01-20 — End: ?

## 2021-01-20 NOTE — Unmapped (Signed)
[place] Heart Failure Monitoring Device Report    Patient:  Douglas Beltran  Date:  January 19, 2021    Device:   Medtronic - Honeygo HF cardiologist:   Dr. Johann Capers EP cardiologist:   Dr. Sheran Luz   Other cardiologist:  as above     PCP:  JEFFREY Rodena Medin, MD      30 day Report:  There were no readings that exceeded the thresholds.    Optivol device detected intrathoracic impedance that was at baseline, suggesting optimal fluid status.      Please see full interrogation report under Media tab for this encounter.     I reviewed the device-generated report and agree with findings.      Recommendation:  Continue monthly monitoring.      [name], NP

## 2021-01-20 NOTE — Unmapped (Signed)
It was good speaking with you today.    As discussed:  ??? Increase your Lokelma to 5 grams four times weekly  ??? If your prescription for Entresto 24-26 mg 1.5 tablets twice daily does not arrive by Thursday afternoon, please pick up the prescription sent to [facility].  ??? One week after resuming Entresto, please go to [facility] to have your labs drawn.  ??? Continue other medications  ??? Make sure medication list matches medications at home  ??? Monitor and log weights, blood pressure and heart rate  ??? Avoid NSAIDs (such as ibuprofen or naproxen)  ??? Lab check 1 week after resuming Entresto  ??? Next phone visit between 2/22 and 2/25  ??? Please let us know if you have any issues        Call the clinic at [phone] with questions.    Our clinic fax number is [phone].    If you need to reschedule future appointments, please call [phone] or [phone]    After office hours, if you have urgent questions/problems, contact the on-call cardiologist through the hospital operator: [phone].

## 2021-01-20 NOTE — Unmapped (Signed)
[facility] CARDIOLOGY CLINIC  [address]  [place], [place] [address]  PHONE: [phone]  FAX: [phone]           CLINICAL PHARMACIST PRACTITIONER NOTE   HEART FAILURE    Referring Provider: Dr. Wonda Cheng  Cardiologist: Dr. Wonda Cheng, Dr. Hessie Dibble, and Dr. Herbert Deaner  Primary Care Provider: Marguarite Arbour, MD     Assessment and Plan:     #HFrEF (LVEF 15-20%) with BiV ICD placement on 07/29/20. Today he reports NYHA class I-II symptoms, with stable BP, HR, and weights. He denies falls, syncope, or presyncope. His Optivol device was checked on 01/19/21 and showed no readings that exceeded thresholds, suggesting optimal fluid status. He denies any swelling and has not taken any doses of PRN Lasix for several weeks. Overall he is feeling well and is without complaint. Despite taking Lokelma 5 grams three times weekly, his potassium has not improved much (K 4.8-5.0 mmol/L). Today, Douglas Beltran expressed interest in increasing the dose of Lokelma to 5 grams 4 times weekly, which we agreed would be helpful in reducing his potassium to facilitate further uptitration of GDMT. At his last visit with Dr. Wonda Cheng on 12/19/20, Douglas Beltran was increased to 1.5 tablets BID. Douglas Beltran began taking 1.5 tablets BID but ran out of Entresto on 01/14/21. He is still waiting for the delivery of the new Entresto prescription, which is scheduled to arrive tomorrow, and he plans to implement this dose increase as soon as it arrives by mail. He is also aware of the need for [place] to check labs 1 week after he resumes Entresto use. Today, we reached a shared decision to not make any additional medication changes until his potassium level can be re-assessed ~1 week after he resumes Entresto 24-26 mg 1.5 tablets BID. Douglas Beltran verbalized understanding of the above plan and was given an opportunity to ask questions, all of which were answered.  ?? Resume Entresto 24/26 mg 1.5 tablets twice daily when this new prescription arrives by mail.  ?? In case there are further delays in mail delivery, a prescription for Entresto 24/26 mg 1.5 tablets BID was sent to [facility], including the information for the 30-day free trial. Douglas Beltran was asked to pick up this prescription if his mail order prescription did not arrive by Thursday afternoon.  ?? Increase Lokelma to 5 grams four times weekly  ?? Continue spironolactone 25 mg every other day, carvedilol 12.5 mg in the AM and 25 mg in the PM, empagliflozin (Jardiance) 10 mg daily and furosemide 20 mg PRN   ?? Of note, Douglas Beltran was previously taking carvedilol 25 mg BID but self-adjusted to 12.5 mg qAM and 25 mg qPM due to worsening dizziness.  ?? Today, he expressed interest in increasing his carvedilol dose back to 25 mg BID, once he was back on the [place]. He reports frustration with the splitting of tablets and would like to avoid this as much as possible.  ?? May consider titrating sacubitril/valsartan Sherryll Burger) and/or spironolactone at a future visit pending potassium recheck and BP  ?? Monitor and log weights, blood pressure, and heart rate  ?? Avoid NSAIDs   ?? Ferritin 30.8 ng/mL, TSAT 32%; HGB 14.6 g/dL  ?? Douglas Beltran may benefit from IV iron therapy, which Dr. Geni Bers is amendable to. The potential for IV iron therapy was discussed with Douglas Beltran today but he did not wish to proceed at this time. He requested some time to research this on  his own and is open to further discussions moving forward.  ?? Most recent ECHO July 2021 (Repeat ECHO planned for April 2022)  ?? BMP one week after resuming Entresto at increased dose    #CAD - initially diagnosed with multivessel disease in 2013 with subsequent PCI (rather than CABG per patient decision). NSTEMI with PCI to proximal LAD and mid RCA in May 2021. He reports continuing to tolerate his Repatha injections.   ?? Key risk factors include: CAD, PCI, Hypertension, Diabetes (hemoglobin A1c 6.0%), BMI (25.6 kg/m2) and Lipids (LDL 32 mg/dL, triglycerides 75 mg/dL) (lipid panel and ZOX0R last checked on 09/01/20 per Care Everywhere)  ?? Continue aspirin 81 mg daily, clopidogrel 75 mg daily, atorvastatin 80 mg daily and evolocumab (Repatha) 140 mg every 2 weeks   ?? Continue DAPT until May 2022. Then will transition to clopidogrel monotherapy. Selection of clopidogrel for monotherapy use was discussed with Dr. Hessie Dibble and he is amendable to this plan.      #Diabetes - HbA1c 6.0% on 09/01/20 (Care Everywhere)  ?? Managed by PCP  ?? Continue empagliflozin 10 mg daily and metformin 1000 mg BID    #Medication Adherence & Access:   ??? Reviewed the indication, dose, and frequency of each medication with patient   ??? Medications reviewed in EPIC medication station and updated today by the clinical pharmacist practitioner.  ??? Manufacturer's assistance APPROVED for: Entresto  ??? Manufacturer's assistance APPROVED for: Lokelma  ??? Manufacturer's assistance DENIED for: Jardiance     #FOLLOW UP:   ?? return to clinic to see cardiologist as scheduled with Dr. Wonda Cheng on 04/03/21   ?? repeat pharmacist phone visit in about 2-4 weeks   ?? BMP ordered for any [facility] facility. Pt will get labs at [facility] ~1 week after resuming  Entresto at increased dose          I spent a total of 30 minutes on the phone with the patient delivering clinical care and providing education/counseling.     The patient was physically located in [place] or a state in which I am permitted to provide care. The patient and/or parent/guardian understood that s/he may incur co-pays and cost sharing, and agreed to the telemedicine visit. The visit was reasonable and appropriate under the circumstances given the patient's presentation at the time.    The patient and/or parent/guardian has been advised of the potential risks and limitations of this mode of treatment (including, but not limited to, the absence of in-person examination) and has agreed to be treated using telemedicine. The patient's/patient's family's questions regarding telemedicine have been answered.     If the visit was completed in an ambulatory setting, the patient and/or parent/guardian has also been advised to contact their provider???s office for worsening conditions, and seek emergency medical treatment and/or call 911 if the patient deems either necessary.     Douglas Beltran, PharmD  PGY2 Cardiology Pharmacy Resident      Trachelle Low Elige Ko, PharmD, BCPS, [facility], CPP  Cardiology Clinical Pharmacist Practitioner  [facility]  [address]  [place], [place] [address]  Phone: [phone]  Fax: [phone]        History of Present Illness:   Douglas Beltran is a 80 y.o. year old male with a history of HFrEF, CKD, and diabetes who presents today for consideration of HF medication optimization at the request of Dr. Carin Hock.  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    Summary  of Recent Visits and Key Medication Changes  ??? 08/04/20: BMP included SCr 1.54 mg/dL and K 4.8 mmol/L  ??? 07/16/68: Entresto 24-26 mg increased to 1 tablet in the morning and 1.5 tablets in the afternoon; furosemide decreased to 20 mg every other day  ??? 08/26/20: Carvedilol increased to 25 mg in the morning and 12.5 mg in the evening.   ?? 09/01/20: BMP (CareEverywhere) included K 4.6 mmol/L and SCr 1.3 mg/dL  ?? 09/10/20: furosemide decreased from 20 mg every other day to 20 mg only if needed. Spironolactone 12.5 mg in the evening started  ?? 09/18/20: BMP included K 5.2 mmol/L and SCr of 1.33  ?? 09/19/20: Carvedilol increased to 25 mg BID (two 12.5mg  tabs BID until this supply runs out, has received new Rx for 25 mg abs)  ?? 09/30/20: BMP included K 5.3 mmol/L and SCr 1.43 mg/dL  ?? 10/13/20: BMP included K 5.1 mmol/L and SCr 1.40 mg/dL  ?? 10/28/20: Lokelma 5 grams three times weekly started (first dose taken)  ?? 10/31/20: Lasix decreased to 20 mg every other day; carvedilol decreased to 25 mg in the AM and 12.5 mg in the PM due to dizziness  ?? 11/18/20: BMP with K 5.0 mmol/L, SCr 1.33 mg/dL; Ferritin 30.8 ng/mL, TSAT 32%; HGB 14.6 g/dL  ?? 11/25/20: BMP with K 4.8 mmol/L, Scr 1.4 mg/dL (per CareEverywhere)  ?? 12/19/20: Sherryll Burger increased to 1.5 tab (24-26 mg strength) BID by Dr. Wonda Cheng (dose adjustment NOT made yet, waiting on new RX to be delivered)  ?? 01/14/21: Ran out of Entresto at home  ?? 01/20/21: Lokelma increased to 5 grams 4 times weekly      Interval History  He reports no active symptoms of heart failure. No dyspnea, orthopnea, paroxysmal nocturnal dyspnea, or LE edema. No palpitations, chest pain. He reports a good appetite without bloating, abd distension or early satiety. He reports laying flat at night without difficulty. He does report some dizziness when standing up quickly or bending over. Douglas Beltran continues to exercise at a local hospital gym 3x/week and walks two miles at the mall 4x/week. He is able to complete the entire two miles without experiencing SOB.    Blood pressure self-monitoring  He checks his BP regularly, with readings ranging from 107-136/57-77 mmHg. His HR has ranged 66-76s bpm. He denies feeling poorly with any of these readings     Weight self-monitoring  He weighs himself daily. His recent weights have ranged 197-200 lbs.. He has not required PRN Lasix in several weeks. He denies signs/symptoms of dehydration or hypovolemia. Overall, Douglas Beltran reports losing 4 inches from his waistline since May 2021.    Cardiovascular History, Studies and Procedures:    Cath / PCI:  04/15/20 Left heart catheterization  1. PCI was performed of the??99%??heavily calcificed Left Main and??LAD. Rotational atherectomy was performed using a 1.75 mm burr. PCI was performed with a 4.0x38 Xience (LM-LAD).??  2. PCI was performed of the??90%??heavily calcificed??RCA. Rotational atherectomy was performed using a 1.75 mm burr. PCI was performed with a 4.0x38 and 3.5x30 with rota (post to 4.5).   3. Excellent??angiographic result with TIMI 3??flow    CV Surgery:     EP Procedures and Devices: Non-Invasive Evaluation(s):   Echo:   06/18/20 ECHO Summary    1. Limited study to assess ventricular function.    2. The left ventricle is mildly dilated in size with mildly increased wall thickness.    3. The left ventricular systolic function is  severely decreased, LVEF is visually estimated at 15-20%.    4. The inferior wall, basal inferolateral wall, and mid inferolateral wall are akinetic.    5. There is grade II diastolic dysfunction (elevated filling pressure).    6. The left atrium is mildly dilated in size.    7. The aortic valve is probably trileaflet with mildly thickened leaflets with normal excursion.     8. The right ventricle is mildly dilated in size, with normal systolic Function.     04/11/20 ECHO Summary    1. The left ventricle is moderately dilated in size with mildly increased wall thickness.    2. The left ventricular systolic function is severely decreased, LVEF is visually estimated at 20-25%.    3. There is grade II diastolic dysfunction (elevated filling pressure).    4. The mitral valve leaflets are mildly thickened with normal leaflet mobility.    5. There is mild to moderate mitral valve regurgitation.     6. The left atrium is severely dilated in size.    7. The aortic valve is trileaflet with mildly thickened leaflets with normal excursion.    8. The right ventricle is mildly dilated in size, with low normal systolic function.    9. The right atrium is moderately dilated  in size.    10. IVC size and inspiratory change suggest mildly elevated right atrial pressure. (5-10 mmHg).    11. There is a trivial, anterior pericardial effusion.    Cardiac CT/MRI/Nuclear Tests:     Cardiopulmonary Stress Tests:         Past Medical & Surgical History  Past Medical History:   Diagnosis Date   ??? CHF (congestive heart failure) (CMS-HCC)    ??? Coronary artery disease    ??? Diabetes mellitus (CMS-HCC)    ??? Hypertension 07/12/2014   ??? Macrocytic anemia    ??? Mixed hyperlipidemia 07/12/2014        Past Surgical History:   Procedure Laterality Date   ??? ABDOMINAL SURGERY     ??? EYE SURGERY     ??? JOINT REPLACEMENT     ??? PR CATH PLACE/CORON ANGIO, IMG SUPER/INTERP,W LEFT HEART VENTRICULOGRAPHY N/A 04/15/2020    Procedure: CATH LEFT HEART CATHETERIZATION W INTERVENTION;  Surgeon: Rosana Hoes, MD;  Location: [facility] CATH;  Service: Cardiology   ??? PR INSJ/RPLCMT PERM DFB W/TRNSVNS LDS 1/DUAL CHMBR N/A 08/04/2020    Procedure: Implant Biventricular ICD System;  Surgeon: Dorcas Carrow, MD;  Location: [facility] EP;  Service: Cardiology        Social & Family History  He  reports that he has quit smoking. He has never used smokeless tobacco.     Family History   Problem Relation Age of Onset   ??? Heart disease Father    ??? Diabetes Father    ??? Heart disease Brother         Medications:  Reviewed and updated. Medication list includes revisions made during today's encounter.    Current Outpatient Medications   Medication Sig Dispense Refill   ??? ACCU-CHEK GUIDE TEST STRIPS Strp TEST BLOOD SUGAR TWICE DAILY AS INSTRUCTED     ??? ACCU-CHEK SOFTCLIX LANCETS lancets USE AS DIRECTED TWICE DAILY     ??? acetaminophen (TYLENOL) 500 MG tablet Take 500 mg by mouth every six (6) hours as needed for pain.     ??? amoxicillin (AMOXIL) 500 MG capsule TAKE 4 CAPSULES BY MOUTH 1 HOUR PRIOR TO APPOINTMENT     ???  aspirin (ECOTRIN) 81 MG tablet Take 81 mg by mouth daily.      ??? atorvastatin (LIPITOR) 80 MG tablet Take 1 tablet (80 mg total) by mouth daily. 90 tablet 3   ??? calcium polycarbophiL (FIBERCON) 625 mg tablet Take 1,250 mg by mouth nightly.     ??? carvediloL (COREG) 25 MG tablet Take 1 tablet (25 mg) in the morning and one-half tablet (12.5 mg) in the evening (Patient taking differently: Take 1/2 tablet (12.5 mg) in the morning and one tablet (25 mg) in the evening) 60 tablet 3   ??? clopidogreL (PLAVIX) 75 mg tablet Take 1 tablet (75 mg total) by mouth daily. 90 tablet 3   ??? empagliflozin (JARDIANCE) 10 mg tablet Take 1 tablet (10 mg total) by mouth daily. 30 tablet 11   ??? empty container (SHARPS-A-GATOR DISPOSAL SYSTEM) Misc Use as directed for sharps disposal 1 each 2   ??? evolocumab 140 mg/mL PnIj Inject the contents of 1 pen (140 mg) under the skin every fourteen (14) days. 4 mL 11   ??? furosemide (LASIX) 20 MG tablet If needed for weight gain > 2 lbs in one day or 5 lbs in on eweek 90 tablet 3   ??? metFORMIN (GLUCOPHAGE) 1000 MG tablet Take 1,000 mg by mouth 2 (two) times a day with meals.      ??? sacubitriL-valsartan (ENTRESTO) 24-26 mg tablet Take 1.5 tablet in the morning and 1.5 tablets in the evening 270 tablet 3   ??? sertraline (ZOLOFT) 50 MG tablet Take 50 mg by mouth daily.     ??? sodium zirconium cyclosilicate (LOKELMA) 5 gram PwPk packet Take 1 packet (5 g total) by mouth 3 (three) times a week. 30 packet 1   ??? spironolactone (ALDACTONE) 25 MG tablet Take 1 tablet (25 mg total) by mouth every other day. Take at night 45 tablet 3     No current facility-administered medications for this visit.       Allergies/Adverse Events:  Allergies   Allergen Reactions   ??? Iodine Other (See Comments) and Hives          Lab Results  Lab Results   Component Value Date    PRO-BNP 4,040.0 (H) 04/23/2020    PRO-BNP 4,710.0 (H) 04/10/2020    Creatinine 1.33 (H) 11/18/2020    Creatinine 1.40 (H) 10/13/2020    Creatinine 1.33 (H) 01/19/2012    Creatinine 1.19 01/18/2012    BUN 25 (H) 11/18/2020    BUN 25 (H) 10/13/2020    BUN 33 (H) 01/19/2012    BUN 29 (H) 01/18/2012    Potassium 5.0 (H) 11/18/2020    Potassium 5.1 (H) 10/13/2020    Potassium 4.5 01/19/2012    Potassium 4.7 01/18/2012    Magnesium 2.8 (H) 04/23/2020    Magnesium 2.5 (H) 04/16/2020    Magnesium 2.2 01/18/2012    AST 33 04/25/2020    AST 33 04/10/2020    AST 28 01/18/2012    ALT 31 04/25/2020    ALT 42 04/10/2020    ALT 29 01/18/2012    Total Bilirubin 0.9 04/25/2020    Total Bilirubin 0.5 04/10/2020    Total Bilirubin 0.5 01/18/2012    INR 1.08 04/10/2020    INR 1.0 01/18/2012    WBC 5.4 08/04/2020 WBC 10.6 01/19/2012    HGB 14.6 11/18/2020    HGB 12.6 (L) 01/19/2012    HCT 43.4 11/18/2020    HCT 36.8 (L) 01/19/2012    Platelet 169 08/04/2020  Platelet 141 (L) 01/19/2012    Cholesterol, Total 189 05/17/2012    Triglycerides 215 (H) 05/17/2012    HDL 34 (L) 05/17/2012    Non HDL Chol.  155 05/17/2012    LDL Cholesterol, Calculated 112 05/17/2012         Vital Signs:  There were no vitals filed for this visit.    BP Readings from Last 3 Encounters:   12/19/20 121/66   12/17/20 160/84   11/18/20 121/67     Pulse Readings from Last 3 Encounters:   12/19/20 60   12/17/20 60   11/18/20 75     Wt Readings from Last 3 Encounters:   12/19/20 95.5 kg (210 lb 9.6 oz)   12/17/20 95.3 kg (210 lb)   11/18/20 92.9 kg (204 lb 12.8 oz)      BMI Readings from Last 3 Encounters:   12/19/20 26.32 kg/m??   12/17/20 26.96 kg/m??   11/18/20 25.60 kg/m??

## 2021-01-22 NOTE — Unmapped (Signed)
I called Mr. Zeidman today to confirm he received his Entresto shipment from the manufacturer assistance program.  He has confirmed this and is aware of the need to get labs check (going to [facility]) ~ 1 week after re-starting this therapy.    If K is high on next lab check would consider increasing Lokelma  (currently taking this only 4 days per week).  Please note that he is also receiving Lokelma through manufacturer assistance programs    I spent a total of 3 minutes on the phone with the patient delivering clinical care and providing education/counseling.      Tylene Fantasia. Kemper Durie, PharmD, BCPS, [facility], CPP  Cardiology Clinical Pharmacist Practitioner  [facility]  [address]  [place], [place] [address]  Phone: [phone]  Fax: [phone]

## 2021-01-22 NOTE — Unmapped (Signed)
I was the supervising physician in the delivery of the service. Carin Hock, MD

## 2021-01-29 NOTE — Unmapped (Signed)
I have reviewed the battery, threshold, lead impedance, and overall device/lead status, diagnostic data including arrhythmic events and therapies on the device evaluation on Douglas Beltran, [id number].       Pollyann Kennedy, [place]

## 2021-01-30 ENCOUNTER — Encounter: Admit: 2021-01-30 | Discharge: 2021-01-31 | Payer: MEDICARE

## 2021-01-30 LAB — BASIC METABOLIC PANEL
ANION GAP: 7 mmol/L (ref 5–14)
BLOOD UREA NITROGEN: 32 mg/dL — ABNORMAL HIGH (ref 9–23)
BUN / CREAT RATIO: 24
CALCIUM: 10.4 mg/dL (ref 8.7–10.4)
CHLORIDE: 107 mmol/L (ref 98–107)
CO2: 26.7 mmol/L (ref 20.0–31.0)
CREATININE: 1.35 mg/dL — ABNORMAL HIGH
EGFR CKD-EPI AA MALE: 58 mL/min/{1.73_m2} — ABNORMAL LOW (ref >=60)
EGFR CKD-EPI NON-AA MALE: 50 mL/min/{1.73_m2} — ABNORMAL LOW (ref >=60)
GLUCOSE RANDOM: 79 mg/dL (ref 70–179)
POTASSIUM: 4.5 mmol/L (ref 3.4–4.5)
SODIUM: 141 mmol/L (ref 135–145)

## 2021-02-06 NOTE — Unmapped (Signed)
Patient called and requested that his Rx for Plavix not be refilled or sent to [facility].  States that he already has enough medication at this time.  Requests message be sent to [facility].

## 2021-02-12 NOTE — Unmapped (Signed)
[facility] Clinical Assessment & Refill Coordination Note    Douglas Beltran, DOB: 08-27-1941  Phone: [phone] (home)     All above HIPAA information was verified with patient.     Was a Nurse, learning disability used for this call? No    Specialty Medication(s):   General Specialty: Repatha     Current Outpatient Medications   Medication Sig Dispense Refill   ??? ACCU-CHEK GUIDE TEST STRIPS Strp TEST BLOOD SUGAR TWICE DAILY AS INSTRUCTED     ??? ACCU-CHEK SOFTCLIX LANCETS lancets USE AS DIRECTED TWICE DAILY     ??? acetaminophen (TYLENOL) 500 MG tablet Take 500 mg by mouth every six (6) hours as needed for pain.     ??? amoxicillin (AMOXIL) 500 MG capsule TAKE 4 CAPSULES BY MOUTH 1 HOUR PRIOR TO APPOINTMENT     ??? aspirin (ECOTRIN) 81 MG tablet Take 81 mg by mouth daily.      ??? atorvastatin (LIPITOR) 80 MG tablet Take 1 tablet (80 mg total) by mouth daily. 90 tablet 3   ??? calcium polycarbophiL (FIBERCON) 625 mg tablet Take 1,250 mg by mouth nightly.     ??? carvediloL (COREG) 25 MG tablet Take 1 tablet (25 mg) in the morning and one-half tablet (12.5 mg) in the evening (Patient taking differently: Take 1/2 tablet (12.5 mg) in the morning and one tablet (25 mg) in the evening) 60 tablet 3   ??? clopidogreL (PLAVIX) 75 mg tablet Take 1 tablet (75 mg total) by mouth daily. 90 tablet 3   ??? empagliflozin (JARDIANCE) 10 mg tablet Take 1 tablet (10 mg total) by mouth daily. 30 tablet 11   ??? empty container (SHARPS-A-GATOR DISPOSAL SYSTEM) Misc Use as directed for sharps disposal 1 each 2   ??? evolocumab 140 mg/mL PnIj Inject the contents of 1 pen (140 mg) under the skin every fourteen (14) days. 4 mL 11   ??? furosemide (LASIX) 20 MG tablet If needed for weight gain > 2 lbs in one day or 5 lbs in on eweek 90 tablet 3   ??? metFORMIN (GLUCOPHAGE) 1000 MG tablet Take 1,000 mg by mouth 2 (two) times a day with meals.      ??? sacubitriL-valsartan (ENTRESTO) 24-26 mg tablet Take 1 and 1/2 tablet by mouth in the morning and 1 and 1/2 tablets in the evening 90 tablet 3   ??? sertraline (ZOLOFT) 50 MG tablet Take 50 mg by mouth daily.     ??? sodium zirconium cyclosilicate (LOKELMA) 5 gram PwPk packet Take 1 packet (5 g total) by mouth 4 (four) times a week. 30 packet 0   ??? spironolactone (ALDACTONE) 25 MG tablet Take 1 tablet (25 mg total) by mouth every other day. Take at night 45 tablet 3     No current facility-administered medications for this visit.        Changes to medications: Douglas Beltran reports no changes at this time.    Allergies   Allergen Reactions   ??? Iodine Other (See Comments) and Hives       Changes to allergies: No    SPECIALTY MEDICATION ADHERENCE     Repatha 140 mg/ml: 0 days of medicine on hand       Medication Adherence    Patient reported X missed doses in the last month: 0  Specialty Medication: Repatha          Specialty medication(s) dose(s) confirmed: Regimen is correct and unchanged.     Are there any  concerns with adherence? No    Adherence counseling provided? Not needed    CLINICAL MANAGEMENT AND INTERVENTION      Clinical Benefit Assessment:    Do you feel the medicine is effective or helping your condition? Yes    Clinical Benefit counseling provided? Not needed    Adverse Effects Assessment:    Are you experiencing any side effects? No    Are you experiencing difficulty administering your medicine? No    Quality of Life Assessment:    How many days over the past month did your high cholesterol  keep you from your normal activities? For example, brushing your teeth or getting up in the morning. 0    Have you discussed this with your provider? Not needed    Therapy Appropriateness:    Is therapy appropriate? Yes, therapy is appropriate and should be continued    DISEASE/MEDICATION-SPECIFIC INFORMATION      For patients on injectable medications: Patient currently has 0 doses left.  Next injection is scheduled for 3/12.    PATIENT SPECIFIC NEEDS     - Does the patient have any physical, cognitive, or cultural barriers? No    - Is the patient high risk? No    - Does the patient require a Care Management Plan? No     - Does the patient require physician intervention or other additional services (i.e. nutrition, smoking cessation, social work)? No      SHIPPING     Specialty Medication(s) to be Shipped:   General Specialty: Repatha    Other medication(s) to be shipped: No additional medications requested for fill at this time     Changes to insurance: No    Delivery Scheduled: Yes, Expected medication delivery date: 3/8.     Medication will be delivered via Same Day Courier to the confirmed prescription address in [facility]    The patient will receive a drug information handout for each medication shipped and additional FDA Medication Guides as required.  Verified that patient has previously received a Conservation officer, historic buildings.    All of the patient's questions and concerns have been addressed.    Clydell Hakim   [facility] Specialty Pharmacist

## 2021-02-17 MED FILL — REPATHA SURECLICK 140 MG/ML SUBCUTANEOUS PEN INJECTOR: SUBCUTANEOUS | 56 days supply | Qty: 4 | Fill #4

## 2021-02-18 ENCOUNTER — Encounter: Admit: 2021-02-18 | Discharge: 2021-02-19 | Payer: MEDICARE

## 2021-02-18 DIAGNOSIS — Z4502 Encounter for adjustment and management of automatic implantable cardiac defibrillator: Principal | ICD-10-CM

## 2021-02-18 NOTE — Unmapped (Signed)
[facility] Heart Failure Monitoring Device Report    Patient:  Douglas Beltran  Date:  February 18, 2021    Device:   Medtronic - [place] HF cardiologist:   Dr. Johann Capers EP cardiologist:   Dr. Sheran Luz   Other cardiologist:  as above     PCP:  JEFFREY Rodena Medin, MD      30 day Report:  There were no readings that exceeded the thresholds.    Optivol device detected intrathoracic impedance that was at baseline, suggesting optimal fluid status.        Please see full interrogation report under Media tab for this encounter.     I reviewed the device-generated report and agree with findings.      Recommendation:  Continue monthly monitoring.      [name], NP

## 2021-03-19 ENCOUNTER — Encounter: Admit: 2021-03-19 | Discharge: 2021-03-20 | Payer: MEDICARE

## 2021-03-20 NOTE — Unmapped (Signed)
[facility]  Ep Remote Monitoring   Tel:  [phone]  Fax: [phone]    Visit Date:  03/19/2021    Findings: Normal device function, Tested Lead measurements stable and within normal range, Adequate battery reserve-9 years    Arrhythmias: No significant new atrial or ventricular arrhythmias    Plan: Continue Routine Remote Monitoring     Last clinic appointment: 11/18/2020    Manufacturer of Device: Medtronic       Type of Device: CRT-D / Bi-Ventricular Defibrillator  See scanned/downloaded PDF report for model numbers, serial numbers, and date(s) of implant.    Presenting Rhythm: AP/Bi-VP    ______________________________________________________________________  Percentage Biventricular Pacing: 97 %    Heart Failure Monitoring: Optivol  No Evidence of Fluid Accumulation   Hx of HF  ______________________________________________________________________      Please see downloaded PDF file of transmission under Media Tab  for full details of device interrogation to include, when applicable,  battery status/charge time, lead trend data, and programmed parameters.

## 2021-03-20 NOTE — Unmapped (Signed)
All appointments with [facility] EP REMOTE MONITORING occur from home.  You do not come in to the office or hospital for these visits.        Your Device data report from this visit can be found in your Mychart by following the steps below.  Step #1-Select Menu then document center    Step #2-Select my documents    Step #3-Select Device checks    Please contact us with any questions.    Thank you,    [facility] EP REMOTE MONITORING [facility]  Phone. [phone]  Fax. [phone]  Email. [email or web address]

## 2021-03-23 ENCOUNTER — Encounter: Admit: 2021-03-23 | Discharge: 2021-03-23 | Payer: MEDICARE

## 2021-03-23 DIAGNOSIS — Z4502 Encounter for adjustment and management of automatic implantable cardiac defibrillator: Principal | ICD-10-CM

## 2021-03-23 NOTE — Unmapped (Signed)
It was good speaking with you today.    As discussed:  ??? Increase Entresto 24/26 mg 1.5 tabs in AM and 2 tabs in PM for a few days then increase to 2 tabs in AM and PM.    ??? Continue other medications  ??? Continue to monitor and log weights, blood pressure and heart rate  ??? Avoid NSAIDs (such as ibuprofen or naproxen)  ??? Lab check with Dr. Wonda Cheng  ??? Next visit Dr. Wonda Cheng next week and Bethanne Ginger in 4-6 weeks   ??? Please let us know if you have any issues        Call the clinic at [phone] with questions.    Our clinic fax number is [phone].    If you need to reschedule future appointments, please call [phone] or [phone]    After office hours, if you have urgent questions/problems, contact the on-call cardiologist through the hospital operator: [phone].

## 2021-03-23 NOTE — Unmapped (Signed)
I have reviewed and agree with the above battery, threshold, lead impedance, and overall device/lead status, diagnostic data including arrhythmic events and therapies on the device evaluation.    Nyriah Coote, MD

## 2021-03-23 NOTE — Unmapped (Signed)
Douglas Beltran  [address]  [place], [place] [address]  PHONE: [phone]  FAX: [phone]           CLINICAL PHARMACIST PRACTITIONER NOTE   HEART FAILURE    Referring Provider: Dr. Wonda Cheng  Cardiologist: Dr. Wonda Cheng, Dr. Hessie Dibble, and Dr. Herbert Deaner  Primary Care Provider: Marguarite Arbour, MD     Assessment and Plan:     #HFrEF (LVEF 15-20%) with BiV ICD placement on 07/29/20. Today he reports NYHA class I-II symptoms, with stable BP, HR, and weights. He denies falls, syncope, or presyncope. His Optivol device was checked on 02/18/21 and showed no readings that exceeded thresholds, suggesting optimal fluid status. He denies any swelling and has not taken any doses of PRN Lasix for several weeks. Overall he is feeling well and is without complaint. Now taking Lokelma 5 grams four times weekly and his potassium recently checked (K 4.8 mmol/L).  Tolerating current dose of Entresto 24/26 mg 1.5 tab BID and seeing Dr. Wonda Cheng next week.   ?? Increase Entresto 24/26 mg 2 tabs BID  ?? Patient will first gradually increase dose, taking 2 tab PM for a few days then going up to 2 tab BID.   ?? Will use his current supply (new Rx for 49/51 mg tablets [facility])  ?? Continue Lokelma to 5 grams four times weekly  ?? Consider the need to increase Lokelma to five times weekly based on next weeks labs  ?? Continue spironolactone 25 mg every other day, carvedilol 12.5 mg in the AM and 25 mg in the PM, empagliflozin (Jardiance) 10 mg daily and furosemide 20 mg PRN   ?? Of note, Douglas Beltran was previously taking carvedilol 25 mg BID but self-adjusted to 12.5 mg qAM and 25 mg qPM due to worsening dizziness. He is still contemplating if he may increase carvedilol dose back to 25 mg BID to avoid the need for splitting the tablets.    ?? May consider titrating sacubitril/valsartan Sherryll Burger) and/or spironolactone at a future visit pending potassium recheck and BP  ?? Monitor and log weights, blood pressure, and heart rate  ?? Avoid NSAIDs ?? Ferritin 30.8 ng/mL, TSAT 32%; HGB 14.6 g/dL  ?? Douglas Beltran may benefit from IV iron therapy, which Dr. Wonda Cheng is amendable to. The potential for IV iron therapy was discussed with Douglas Beltran at last visit but he did not wish to proceed at this time. He requested some time to research this on his own and is open to further discussions moving forward.  ?? Most recent ECHO July 2021 (Repeat ECHO planned for April 2022)    #CAD - initially diagnosed with multivessel disease in 2013 with subsequent PCI (rather than CABG per patient decision). NSTEMI with PCI to proximal LAD and mid RCA in May 2021. He reports continuing to tolerate his Repatha injections.   ?? Key risk factors include: CAD, PCI, Hypertension, Diabetes (hemoglobin A1c 6.1%), BMI (25.6 kg/m2) and Lipids (LDL 26 mg/dL, triglycerides 57 mg/dL)   ?? Lipids and HgbA1c last checked 03/06/21 (PCP)  ?? Continue aspirin 81 mg daily, clopidogrel 75 mg daily, atorvastatin 80 mg daily and evolocumab (Repatha) 140 mg every 2 weeks   ?? Continue DAPT until May 2022. Then will transition to clopidogrel monotherapy. Selection of clopidogrel for monotherapy use was discussed with Dr. Hessie Dibble and he is amendable to this plan.      #Diabetes - HbA1c 6.1% on 03/06/21  ?? Managed by PCP  ?? Continue  empagliflozin 10 mg daily and metformin 1000 mg BID    #Medication Adherence & Access:   ??? Reviewed the indication, dose, and frequency of each medication with patient   ??? Medications reviewed in EPIC medication station and updated today by the clinical pharmacist practitioner.  ??? Manufacturer's assistance APPROVED for: Entresto  ??? Manufacturer's assistance APPROVED for: Lokelma ([facility])  ??? Manufacturer's assistance DENIED for: Jardiance     #FOLLOW UP:   ?? return to [facility] to see cardiologist as scheduled with Dr. Wonda Cheng on 04/03/21; labs to be checked at the visit  ?? repeat pharmacist phone visit in about 2-4 weeks       I spent a total of 20 minutes on the phone with the patient delivering clinical care and providing education/counseling.     The patient was physically located in [place] or a state in which I am permitted to provide care. The patient and/or parent/guardian understood that s/he may incur co-pays and cost sharing, and agreed to the telemedicine visit. The visit was reasonable and appropriate under the circumstances given the patient's presentation at the time.    The patient and/or parent/guardian has been advised of the potential risks and limitations of this mode of treatment (including, but not limited to, the absence of in-person examination) and has agreed to be treated using telemedicine. The patient's/patient's family's questions regarding telemedicine have been answered.     If the visit was completed in an ambulatory setting, the patient and/or parent/guardian has also been advised to contact their provider???s office for worsening conditions, and seek emergency medical treatment and/or call 911 if the patient deems either necessary.    Rafael Bihari, PharmD, BCPS, BCCP, CPP  Cardiology Clinical Pharmacist Practitioner  [facility]  [address]  [place], [place] [address]  Phone: [phone]  Fax: [phone]        History of Present Illness:   Douglas Beltran is a 80 y.o. year old male with a history of HFrEF, CKD, and diabetes who presents today for consideration of HF medication optimization at the request of Dr. Carin Hock.  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    Summary of Recent Visits and Key Medication Changes  ??? 08/04/20: BMP included SCr 1.54 mg/dL and K 4.8 mmol/L  ??? 12/15/06: Entresto 24-26 mg increased to 1 tablet in the morning and 1.5 tablets in the afternoon; furosemide decreased to 20 mg every other day  ??? 08/26/20: Carvedilol increased to 25 mg in the morning and 12.5 mg in the evening.   ?? 09/01/20: BMP ([facility]) included K 4.6 mmol/L and SCr 1.3 mg/dL  ?? 09/10/20: furosemide decreased from 20 mg every other day to 20 mg only if needed. Spironolactone 12.5 mg in the evening started  ?? 09/18/20: BMP included K 5.2 mmol/L and SCr of 1.33  ?? 09/19/20: Carvedilol increased to 25 mg BID (two 12.5mg  tabs BID until this supply runs out, has received new Rx for 25 mg abs)  ?? 09/30/20: BMP included K 5.3 mmol/L and SCr 1.43 mg/dL  ?? 10/13/20: BMP included K 5.1 mmol/L and SCr 1.40 mg/dL  ?? 10/28/20: Lokelma 5 grams three times weekly started (first dose taken)  ?? 10/31/20: Lasix decreased to 20 mg every other day; carvedilol decreased to 25 mg in the AM and 12.5 mg in the PM due to dizziness  ?? 11/18/20: BMP with K 5.0 mmol/L, SCr 1.33 mg/dL; Ferritin 30.8 ng/mL, TSAT 32%; HGB 14.6 g/dL  ??  11/25/20: BMP with K 4.8 mmol/L, Scr 1.4 mg/dL (per [facility])  ?? 12/19/20: Sherryll Burger increased to 1.5 tab (24-26 mg strength) BID by Dr. Wonda Cheng (dose adjustment NOT made yet, waiting on new RX to be delivered)  ?? 01/14/21: Ran out of Entresto at home  ?? 01/20/21: Lokelma increased to 5 grams 4 times weekly, Resume Entresto 24/26 mg 1.5 tabs BID  ?? 01/30/21 BMP Scr 1.35 mg/dL and K- 4.5 mmol/L  ?? 1/61/09 BMP Scr 1.3 mg/dL and K 4.8 mmol/L (care everywhere)      Interval History    Spoke with patient who reports he has been doing well.  He has continued to exercise at a local hospital gym 3x/week and walks two miles at the mall 4x/week. He is able to complete the entire two miles without experiencing SOB. Patient has lost 25 lbs since last May and wants to keep his weight down < 200 lbs.   Reports tolerating his current HF regimen he does get lightheadedness when standing up, bending down but it has remained stable. He checks his BP's at home reports mostly stable with one BP 92/49 mmHg.  He is coming in to see Dr. Wonda Cheng next week.     Reports that he does not like splitting his pills in half and would like to avoid doing so.  He recently got a 90 day supply of Entresto 24/26 mg tablets.    HF symptoms:  He reports no active symptoms of heart failure. No dyspnea, orthopnea, paroxysmal nocturnal dyspnea, or LE edema. No palpitations, chest pain. He reports a good appetite without bloating, abd distension or early satiety. He reports laying flat at night without difficulty.    Blood pressure self-monitoring  He checks his BP regularly, with readings as noted below. He denies feeling poorly with any of these readings.  Checks BP's and weight after getting out of the shower in the morning.  BP's   HR's  124/65  86  117/56  75  103/60  67  136/65  55  92/49   70  102/60  71  129/65  45  110/64  67  107/56  67    Weight self-monitoring  He weighs himself daily and is now down to 196 lbs regularly.  He has not required PRN Lasix in several weeks. He denies signs/symptoms of dehydration or hypovolemia.      Cardiovascular History, Studies and Procedures:    Cath / PCI:  04/15/20 Left heart catheterization  1. PCI was performed of the??99%??heavily calcificed Left Main and??LAD. Rotational atherectomy was performed using a 1.75 mm burr. PCI was performed with a 4.0x38 Xience (LM-LAD).??  2. PCI was performed of the??90%??heavily calcificed??RCA. Rotational atherectomy was performed using a 1.75 mm burr. PCI was performed with a 4.0x38 and 3.5x30 with rota (post to 4.5).   3. Excellent??angiographic result with TIMI 3??flow    CV Surgery:     EP Procedures and Devices:     Non-Invasive Evaluation(s):   Echo:   06/18/20 ECHO Summary    1. Limited study to assess ventricular function.    2. The left ventricle is mildly dilated in size with mildly increased wall thickness.    3. The left ventricular systolic function is severely decreased, LVEF is visually estimated at 15-20%.    4. The inferior wall, basal inferolateral wall, and mid inferolateral wall are akinetic.    5. There is grade II diastolic dysfunction (elevated filling pressure).    6. The left atrium  is mildly dilated in size.    7. The aortic valve is probably trileaflet with mildly thickened leaflets with normal excursion. 8. The right ventricle is mildly dilated in size, with normal systolic Function.     04/11/20 ECHO Summary    1. The left ventricle is moderately dilated in size with mildly increased wall thickness.    2. The left ventricular systolic function is severely decreased, LVEF is visually estimated at 20-25%.    3. There is grade II diastolic dysfunction (elevated filling pressure).    4. The mitral valve leaflets are mildly thickened with normal leaflet mobility.    5. There is mild to moderate mitral valve regurgitation.     6. The left atrium is severely dilated in size.    7. The aortic valve is trileaflet with mildly thickened leaflets with normal excursion.    8. The right ventricle is mildly dilated in size, with low normal systolic function.    9. The right atrium is moderately dilated  in size.    10. IVC size and inspiratory change suggest mildly elevated right atrial pressure. (5-10 mmHg).    11. There is a trivial, anterior pericardial effusion.    Cardiac CT/MRI/Nuclear Tests:     Cardiopulmonary Stress Tests:         Past Medical & Surgical History  Past Medical History:   Diagnosis Date   ??? CHF (congestive heart failure) (CMS-HCC)    ??? Coronary artery disease    ??? Diabetes mellitus (CMS-HCC)    ??? Hypertension 07/12/2014   ??? Macrocytic anemia    ??? Mixed hyperlipidemia 07/12/2014        Past Surgical History:   Procedure Laterality Date   ??? ABDOMINAL SURGERY     ??? EYE SURGERY     ??? JOINT REPLACEMENT     ??? PR CATH PLACE/CORON ANGIO, IMG SUPER/INTERP,W LEFT HEART VENTRICULOGRAPHY N/A 04/15/2020    Procedure: CATH LEFT HEART CATHETERIZATION W INTERVENTION;  Surgeon: Rosana Hoes, MD;  Location: [facility] CATH;  Service: Cardiology   ??? PR INSJ/RPLCMT PERM DFB W/TRNSVNS LDS 1/DUAL CHMBR N/A 08/04/2020    Procedure: Implant Biventricular ICD System;  Surgeon: Dorcas Carrow, MD;  Location: [facility] EP;  Service: Cardiology        Social & Family History  He  reports that he has quit smoking. He has never used smokeless tobacco.     Family History   Problem Relation Age of Onset   ??? Heart disease Father    ??? Diabetes Father    ??? Heart disease Brother         Medications:  Reviewed and updated. Medication list includes revisions made during today's encounter.    Current Outpatient Medications   Medication Sig Dispense Refill   ??? ACCU-CHEK GUIDE TEST STRIPS Strp TEST BLOOD SUGAR TWICE DAILY AS INSTRUCTED     ??? ACCU-CHEK SOFTCLIX LANCETS lancets USE AS DIRECTED TWICE DAILY     ??? acetaminophen (TYLENOL) 500 MG tablet Take 500 mg by mouth every six (6) hours as needed for pain.     ??? amoxicillin (AMOXIL) 500 MG capsule TAKE 4 CAPSULES BY MOUTH 1 HOUR PRIOR TO APPOINTMENT     ??? aspirin (ECOTRIN) 81 MG tablet Take 81 mg by mouth daily.      ??? atorvastatin (LIPITOR) 80 MG tablet Take 1 tablet (80 mg total) by mouth daily. 90 tablet 3   ??? calcium polycarbophiL (FIBERCON) 625 mg tablet Take 1,250 mg by  mouth nightly.     ??? carvediloL (COREG) 25 MG tablet Take 1 tablet (25 mg) in the morning and one-half tablet (12.5 mg) in the evening (Patient taking differently: Take 1/2 tablet (12.5 mg) in the morning and one tablet (25 mg) in the evening) 60 tablet 3   ??? clopidogreL (PLAVIX) 75 mg tablet Take 1 tablet (75 mg total) by mouth daily. 90 tablet 3   ??? empagliflozin (JARDIANCE) 10 mg tablet Take 1 tablet (10 mg total) by mouth daily. 30 tablet 11   ??? empty container (SHARPS-A-GATOR DISPOSAL SYSTEM) Misc Use as directed for sharps disposal 1 each 2   ??? evolocumab 140 mg/mL PnIj Inject the contents of 1 pen (140 mg) under the skin every fourteen (14) days. 4 mL 11   ??? furosemide (LASIX) 20 MG tablet If needed for weight gain > 2 lbs in one day or 5 lbs in on eweek 90 tablet 3   ??? metFORMIN (GLUCOPHAGE) 1000 MG tablet Take 1,000 mg by mouth 2 (two) times a day with meals.      ??? sacubitriL-valsartan (ENTRESTO) 24-26 mg tablet Take 1 and 1/2 tablet by mouth in the morning and 1 and 1/2 tablets in the evening 90 tablet 3   ??? sertraline (ZOLOFT) 50 MG tablet Take 50 mg by mouth daily.     ??? sodium zirconium cyclosilicate (LOKELMA) 5 gram PwPk packet Take 1 packet (5 g total) by mouth 4 (four) times a week. 30 packet 0   ??? spironolactone (ALDACTONE) 25 MG tablet Take 1 tablet (25 mg total) by mouth every other day. Take at night 45 tablet 3     No current facility-administered medications for this visit.       Allergies/Adverse Events:  Allergies   Allergen Reactions   ??? Iodine Other (See Comments) and Hives          Lab Results  Lab Results   Component Value Date    PRO-BNP 4,040.0 (H) 04/23/2020    PRO-BNP 4,710.0 (H) 04/10/2020    Creatinine 1.35 (H) 01/30/2021    Creatinine 1.33 (H) 11/18/2020    Creatinine 1.33 (H) 01/19/2012    Creatinine 1.19 01/18/2012    BUN 32 (H) 01/30/2021    BUN 25 (H) 11/18/2020    BUN 33 (H) 01/19/2012    BUN 29 (H) 01/18/2012    Potassium 4.5 01/30/2021    Potassium 5.0 (H) 11/18/2020    Potassium 4.5 01/19/2012    Potassium 4.7 01/18/2012    Magnesium 2.8 (H) 04/23/2020    Magnesium 2.5 (H) 04/16/2020    Magnesium 2.2 01/18/2012    AST 33 04/25/2020    AST 33 04/10/2020    AST 28 01/18/2012    ALT 31 04/25/2020    ALT 42 04/10/2020    ALT 29 01/18/2012    Total Bilirubin 0.9 04/25/2020    Total Bilirubin 0.5 04/10/2020    Total Bilirubin 0.5 01/18/2012    INR 1.08 04/10/2020    INR 1.0 01/18/2012    WBC 5.4 08/04/2020    WBC 10.6 01/19/2012    HGB 14.6 11/18/2020    HGB 12.6 (L) 01/19/2012    HCT 43.4 11/18/2020    HCT 36.8 (L) 01/19/2012    Platelet 169 08/04/2020    Platelet 141 (L) 01/19/2012    Cholesterol, Total 189 05/17/2012    Triglycerides 215 (H) 05/17/2012    HDL 34 (L) 05/17/2012    Non HDL Chol.  155 05/17/2012  LDL Cholesterol, Calculated 112 05/17/2012         Vital Signs:  There were no vitals filed for this visit.    BP Readings from Last 3 Encounters:   12/19/20 121/66   12/17/20 160/84   11/18/20 121/67     Pulse Readings from Last 3 Encounters:   12/19/20 60   12/17/20 60   11/18/20 75     Wt Readings from Last 3 Encounters:   12/19/20 95.5 kg (210 lb 9.6 oz)   12/17/20 95.3 kg (210 lb)   11/18/20 92.9 kg (204 lb 12.8 oz)      BMI Readings from Last 3 Encounters:   12/19/20 26.32 kg/m??   12/17/20 26.96 kg/m??   11/18/20 25.60 kg/m??

## 2021-03-24 MED ORDER — ENTRESTO 49 MG-51 MG TABLET
ORAL_TABLET | Freq: Two times a day (BID) | ORAL | 3 refills | 90.00000 days | Status: CP
Start: 2021-03-24 — End: ?

## 2021-03-24 NOTE — Unmapped (Signed)
I was the supervising physician in the delivery of the service. Carin Hock, MD

## 2021-03-25 NOTE — Unmapped (Signed)
[place] Heart Failure Monitoring Device Report    Patient:  Douglas Beltran  Date:  March 25, 2021    Device:   Medtronic - [place] HF cardiologist:   Dr. Johann Capers EP cardiologist:   [facility]   Other cardiologist:  none     PCP:  JEFFREY Rodena Medin, MD      30 day Report:  There were no readings that exceeded the thresholds.    Optivol device detected intrathoracic impedance that was at baseline, suggesting optimal fluid status.      Please see full interrogation report under Media tab for this encounter.     I reviewed the device-generated report and agree with findings.      Recommendation:    Continue monthly monitoring.      Hope Budds, NP'

## 2021-03-31 NOTE — Unmapped (Signed)
[facility]    Specialty Medication(s) to be Shipped:   General Specialty: Repatha    Other medication(s) to be shipped: No additional medications requested for fill at this time     Douglas Beltran, DOB: 08-31-1941  Phone: [phone] (home)       All above HIPAA information was verified with patient.     Was a Nurse, learning disability used for this call? No    Completed refill call assessment today to schedule patient's medication shipment from the [facility] [phone]).  All relevant notes have been reviewed.     Specialty medication(s) and dose(s) confirmed: Regimen is correct and unchanged.   Changes to medications: Douglas Beltran reports starting the following medications: Entresto 49-51mg   Changes to insurance: No  New side effects reported not previously addressed with a pharmacist or physician: None reported  Questions for the pharmacist: No    Confirmed patient received a Conservation officer, historic buildings and a Surveyor, mining with first shipment. The patient will receive a drug information handout for each medication shipped and additional FDA Medication Guides as required.       DISEASE/MEDICATION-SPECIFIC INFORMATION        For patients on injectable medications: Patient currently has 1 doses left.  Next injection is scheduled for 04/04/21.    SPECIALTY MEDICATION ADHERENCE     Medication Adherence    Patient reported X missed doses in the last month: 0  Specialty Medication: Repatha 140mg /ml  Patient is on additional specialty medications: No  Patient is on more than two specialty medications: No              Were doses missed due to medication being on hold? No    Repatha 140 mg/ml: 4 days of medicine on hand       REFERRAL TO PHARMACIST     Referral to the pharmacist: Not needed      [facility]     Shipping address confirmed in Epic.     Delivery Scheduled: Yes, Expected medication delivery date: 04/14/21.     Medication will be delivered via Same Day Courier to the prescription address in Epic WAM.    Douglas [facility]tran [facility] Specialty Technician

## 2021-04-03 ENCOUNTER — Encounter
Admit: 2021-04-03 | Discharge: 2021-04-03 | Payer: MEDICARE | Attending: Cardiovascular Disease | Primary: Cardiovascular Disease

## 2021-04-03 ENCOUNTER — Encounter: Admit: 2021-04-03 | Discharge: 2021-04-03 | Payer: MEDICARE

## 2021-04-03 NOTE — Unmapped (Signed)
HF Clinic New Patient/Consult Note    Referring Provider: Carin Hock, MD   Carin Hock, MD  [address]  [place],  [place] [address]   Primary Provider: Marguarite Arbour, MD   [address] [facility] [place] [address]       Reason for Visit:  Douglas Beltran is a 80 y.o. male with history of severe three-vessel CAD, type 2 diabetes, HLD, MDD referred by Carin Hock, MD for management of systolic and diastolic heart failure.    Assessment & Plan:    1.  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  Recently admitted to med D in May of this year with NSTEMI found to have worsening HFrEF with EF of 15-20%, grade 2 diastolic dysfunction most recently on echo on 06/18/2020.  Recently underwent BiV ICD placement on 08/04/2020.  Overall well compensated with minimal dyspnea on exertion.  Euvolemic on exam.      - cotninue entresto to 1.5 tablets in the morning and 1.5 tablets in the evening.  - continue Lasix to 20 mg every other day  - Continue taking 1/2 the coreg pill in the morning and a full pill at night   - Continue Jardiance 10 mg daily  ??? Continue spironolactone 25 mg every other day    2. Coronary artery disease: Diagnosed with multivessel disease in 2013 with subsequent PCI.  NSTEMI with PCI to proximal LAD and mid RCA in May 2021.  No symptoms of angina at this time.  ???Continue aspirin, Plavix, atorvastatin 80 mg  ???On evolocumab    Return in about 4 months (around 08/03/2021).    Carin Hock, MD PhD  Advanced Heart Failure, Mechanical Circulatory Support and Transplant Cardiology      History of Present Illness:  Douglas Beltran with history of severe three-vessel CAD, type 2 diabetes, HLD, MDDis being seen at the request of Carin Hock, MD for heart failure management.      The patient's cardiac history started 2013 when he presented to [facility] with shortness of breath and chest tightness was found to have extensive CAD.  At the time was referred for CABG, but patient opted for PCI with placement of stents to 80% proximal mid LAD lesion, 70% mid RCA lesion, and 80% distal RCA stenosis.  At approximately the same time he was diagnosed with ischemic cardiomyopathy, at the time EF 30-35%.  He had been in his usual state of health until May of this year.  While walking around the mall, had significant shortness of breath.  Over the following days his shortness of breath became progressively worse in addition to the start of chest tightness and pressure which prompted him to go to the urgent care, and subsequently sent to the emergency room at [facility].  At that time he underwent left and right heart cath which showed severe three-vessel disease, and an echo done at that time showed an EF of 15-20%.  He was subsequently referred to [facility] for CABG eval versus complex PCI.      Patient had cardiac MRI done on 5 3 which showed a severe wall motion abnormalities, EF approximately 19%, and significant scarring of apical septal wall but otherwise had viable cardiac tissue, but was deemed to high risk for CABG.  He underwent PCI with 2 DES placed to LM/LAD and RCA.  Since  his hospitalization, patient has undergone placement of a BiV ICD.  Repeat echo 2 months following PCI showed similar results to prior, EF of 15-20%, grade 2 diastolic dysfunction.    Over this time since discharge, patient has been able to take walks around the mall but slower than previously.  Does have some dyspnea on exertion when he walks too fast, or walks for an extended period of time around the mall.  No chest tightness or pressure with exertion.  Denies any lower extremity edema.  Lies flat at night with one pillow.  Reports some intermittent dizziness with standing up in the morning, no syncopal events.    Interval Hx:  He has been feeling quite well since last time we saw him. No issues with volume and walking around comfortably. No orthopnea or PND. Has been doing cardiac rehab which has helped. No chest pain.         Cardiovascular History & Procedures:  Cath / PCI:  PCI 04/15/20:  PCI was performed of the??99%??heavily calcificed Left Main and??LAD. Rotational atherectomy was performed using a 1.75 mm burr. PCI was performed with a 4.0x38 Xience (LM-LAD).??  ??  PCI was performed of the??90%??heavily calcificed??RCA. Rotational atherectomy was performed using a 1.75 mm burr. PCI was performed with a 4.0x38 and 3.5x30 with rota (post to 4.5).   ??  Excellent??angiographic result with TIMI 3??flow.??    CV Surgery: None    EP Procedures and Devices:  BiV ICD placed 08/04/20    Non-Invasive Evaluation(s):  Echo:  06/18/20:  Summary    1. Limited study to assess ventricular function.    2. The left ventricle is mildly dilated in size with mildly increased wall  thickness.    3. The left ventricular systolic function is severely decreased, LVEF is  visually estimated at 15-20%.    4. The inferior wall, basal inferolateral wall, and mid inferolateral wall  are akinetic.    5. There is grade II diastolic dysfunction (elevated filling pressure).    6. The left atrium is mildly dilated in size.    7. The aortic valve is probably trileaflet with mildly thickened leaflets  with normal excursion.    8. The right ventricle is mildly dilated in size, with normal systolic  function.    CT/MRI/Nuclear Tests:  Cardiac MRI 04/14/20:    Left ventricle is dilated (index LVEDV is 191 mL/m2) and there are severe wall motion abnormalities with reduced ejection fraction (LVEF is 19%).  ??  There is scarring of the apical septal wall involving 50-75% thickness. Otherwise, no other areas of myocardial scarring/fibrosis can be convincingly identified.  ??  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     Past Medical History:   Diagnosis Date   ??? CHF (congestive heart failure) (CMS-HCC)    ??? Coronary artery disease    ??? Diabetes mellitus (CMS-HCC)    ??? Hypertension 07/12/2014   ??? Macrocytic anemia    ??? Mixed hyperlipidemia 07/12/2014        Past Surgical History:   Procedure Laterality Date   ??? ABDOMINAL SURGERY     ??? EYE SURGERY     ??? JOINT REPLACEMENT     ??? PR CATH PLACE/CORON ANGIO, IMG SUPER/INTERP,W LEFT HEART VENTRICULOGRAPHY N/A 04/15/2020    Procedure: CATH LEFT HEART CATHETERIZATION W INTERVENTION;  Surgeon: Rosana Hoes, MD;  Location: [facility] CATH;  Service: Cardiology   ??? PR INSJ/RPLCMT PERM DFB W/TRNSVNS LDS 1/DUAL CHMBR N/A 08/04/2020  Procedure: Implant Biventricular ICD System;  Surgeon: Dorcas Carrow, MD;  Location: [facility] EP;  Service: Cardiology         Allergies:  Iodine    Current Medications:  Current Outpatient Medications   Medication Sig Dispense Refill   ??? ACCU-CHEK GUIDE TEST STRIPS Strp TEST BLOOD SUGAR TWICE DAILY AS INSTRUCTED     ??? ACCU-CHEK SOFTCLIX LANCETS lancets USE AS DIRECTED TWICE DAILY     ??? acetaminophen (TYLENOL) 500 MG tablet Take 500 mg by mouth every six (6) hours as needed for pain.     ??? amoxicillin (AMOXIL) 500 MG capsule TAKE 4 CAPSULES BY MOUTH 1 HOUR PRIOR TO APPOINTMENT     ??? aspirin (ECOTRIN) 81 MG tablet Take 81 mg by mouth daily.      ??? atorvastatin (LIPITOR) 80 MG tablet Take 1 tablet (80 mg total) by mouth daily. 90 tablet 3   ??? clopidogreL (PLAVIX) 75 mg tablet Take 1 tablet (75 mg total) by mouth daily. 90 tablet 3   ??? empagliflozin (JARDIANCE) 10 mg tablet Take 1 tablet (10 mg total) by mouth daily. 30 tablet 11   ??? empty container (SHARPS-A-GATOR DISPOSAL SYSTEM) Misc Use as directed for sharps disposal 1 each 2   ??? evolocumab 140 mg/mL PnIj Inject the contents of 1 pen (140 mg) under the skin every fourteen (14) days. 4 mL 11   ??? furosemide (LASIX) 20 MG tablet If needed for weight gain > 2 lbs in one day or 5 lbs in on eweek 90 tablet 3   ??? metFORMIN (GLUCOPHAGE) 1000 MG tablet Take 1,000 mg by mouth 2 (two) times a day with meals.      ??? sacubitriL-valsartan (ENTRESTO) 49-51 mg tablet Take 1 tablet by mouth Two (2) times a day. 180 tablet 3   ??? sertraline (ZOLOFT) 50 MG tablet Take 50 mg by mouth daily.     ??? sodium zirconium cyclosilicate (LOKELMA) 5 gram PwPk packet Take 1 packet (5 g total) by mouth 4 (four) times a week. 30 packet 0   ??? spironolactone (ALDACTONE) 25 MG tablet Take 1 tablet (25 mg total) by mouth every other day. Take at night 45 tablet 3   ??? carvediloL (COREG) 25 MG tablet Take 1/2 tablet (12.5 mg) in the morning and one tablet (25 mg) in the evening 180 tablet 3     No current facility-administered medications for this visit.       Family History:  There is no family history of premature coronary artery disease or sudden cardiac death.    Social history:  Former smoker, quit 31 years ago    Review of Systems:  A full review of 10 systems is unremarkable except as stated in the HPI.     Physical Exam:  VITAL SIGNS:   Vitals:    04/03/21 0952   BP: 109/57   Pulse: 64   SpO2: 96%      Wt Readings from Last 3 Encounters:   04/03/21 92.3 kg (203 lb 6.4 oz)   12/19/20 95.5 kg (210 lb 9.6 oz)   12/17/20 95.3 kg (210 lb)      Today's Body mass index is 25.42 kg/m??.  GENERAL: Well-appearing in no acute distress  HEENT: Normocephalic and atraumatic with anicteric sclerae    NECK: Supple, without lymphadenopathy or thyromegaly. JVP not elevated. There are no carotid bruits  CARDIOVASCULAR: RRR, S1/S2 heard, no S3, no murmurs or gallops  RESPIRATORY: Clear to auscultation without wheezes or  crackles.   ABDOMEN: Soft, non-tender, non-distended with audible bowel sounds. There is no organomegaly or palpable pulsatile mass.   EXTREMITIES:  Lower extremities are warm and without edema. Distal pulses are full and symmetric.  SKIN: No rashes, ecchymosis or petechiae.  NEURO: Alert, pleasant, and appropriate. Non-focal neuro exam    Pertinent Laboratory Studies:   Lab Results   Component Value Date    PRO-BNP 4,040.0 (H) 04/23/2020    PRO-BNP 4,710.0 (H) 04/10/2020    Creatinine 1.35 (H) 01/30/2021    Creatinine 1.33 (H) 11/18/2020    Creatinine 1.33 (H) 01/19/2012    Creatinine 1.19 01/18/2012    BUN 32 (H) 01/30/2021    BUN 25 (H) 11/18/2020    BUN 33 (H) 01/19/2012    BUN 29 (H) 01/18/2012    Potassium 4.5 01/30/2021    Potassium 5.0 (H) 11/18/2020    Potassium 4.5 01/19/2012    Potassium 4.7 01/18/2012    Magnesium 2.8 (H) 04/23/2020    Magnesium 2.5 (H) 04/16/2020    Magnesium 2.2 01/18/2012    AST 33 04/25/2020    AST 33 04/10/2020    AST 28 01/18/2012    ALT 31 04/25/2020    ALT 42 04/10/2020    ALT 29 01/18/2012    Total Bilirubin 0.9 04/25/2020    Total Bilirubin 0.5 04/10/2020    Total Bilirubin 0.5 01/18/2012    INR 1.08 04/10/2020    INR 1.0 01/18/2012    WBC 5.4 08/04/2020    WBC 10.6 01/19/2012    HGB 14.6 11/18/2020    HGB 12.6 (L) 01/19/2012    HCT 43.4 11/18/2020    HCT 36.8 (L) 01/19/2012    Platelet 169 08/04/2020    Platelet 141 (L) 01/19/2012    Triglycerides 215 (H) 05/17/2012    HDL 34 (L) 05/17/2012    Non HDL Chol.  155 05/17/2012    LDL Cholesterol, Calculated 112 05/17/2012       Other pertinent records were reviewed.    Pertinent Test Results from Today:  ECG: A-Sensed, V-paced without other abnormal findings

## 2021-04-03 NOTE — Unmapped (Signed)
Today,glad you're feeling well!    MEDICATIONS:  NO medication changes today.  Call if you have questions about your medications.    LABS:  We will call you if your labs need attention.    NEXT APPOINTMENT:  Return to clinic in 4 months with Derwood Kaplan NP      In general, to take care of your heart failure:  -Limit your fluid intake to 2 Liters (half-gallon) per day.    -Limit your salt intake to ideally 2-3 grams (2000-3000 mg) per day.  -Weigh yourself daily and record, and bring that weight diary to your next appointment.  (Weight gain of 2-3 pounds in 1 day typically means fluid weight.)    The medications for your heart are to help your heart and help you live longer.    Please contact us before stopping any of your heart medications.    Call the clinic at [phone] with questions.  Our clinic fax number is [phone].  If you need to reschedule future appointments, please call [phone] or [phone]  My office number (c/o De Burrs RN) is [phone] if you need further assistance.  After office hours, if you have urgent questions/problems, contact the on-call cardiologist through the hospital operator: [phone].    To learn more about heart failure, please read [place]'s Learning to Live with Heart Failure:  [email or web address].    Available online at   [email or web address] - then click the link Learning to Live with Heart Failure patient booklet    OR    [email or web address] - open the window for Medical Management and click the link Living with Heart Failure

## 2021-04-06 DIAGNOSIS — I5022 Chronic systolic (congestive) heart failure: Principal | ICD-10-CM

## 2021-04-06 MED ORDER — CARVEDILOL 25 MG TABLET
ORAL_TABLET | 3 refills | 0 days
Start: 2021-04-06 — End: ?

## 2021-04-07 DIAGNOSIS — I5022 Chronic systolic (congestive) heart failure: Principal | ICD-10-CM

## 2021-04-07 MED ORDER — CARVEDILOL 25 MG TABLET
ORAL_TABLET | ORAL | 3 refills | 0.00000 days | Status: CP
Start: 2021-04-07 — End: ?

## 2021-04-14 MED FILL — REPATHA SURECLICK 140 MG/ML SUBCUTANEOUS PEN INJECTOR: SUBCUTANEOUS | 56 days supply | Qty: 4 | Fill #5

## 2021-04-27 ENCOUNTER — Institutional Professional Consult (permissible substitution): Admit: 2021-04-27 | Discharge: 2021-04-28 | Payer: MEDICARE

## 2021-04-27 DIAGNOSIS — I5022 Chronic systolic (congestive) heart failure: Principal | ICD-10-CM

## 2021-04-27 DIAGNOSIS — Z4502 Encounter for adjustment and management of automatic implantable cardiac defibrillator: Principal | ICD-10-CM

## 2021-04-27 NOTE — Unmapped (Signed)
All appointments with [facility] EP REMOTE MONITORING occur from home.  You do not come in to the office or hospital for these visits.        Your Device data report from this visit can be found in your Mychart by following the steps below.  Step #1-Select Menu then document center    Step #2-Select my documents    Step #3-Select Device checks    Please contact us with any questions.    Thank you,    [facility] EP REMOTE MONITORING [facility]  Phone. [phone]  Fax. [phone]  Email. [email or web address]

## 2021-04-28 NOTE — Unmapped (Signed)
[place] Heart Failure Monitoring Device Report    Patient:  Douglas Beltran  Date:  Apr 28, 2021    Device:   Medtr[employer] - [place] HF cardiologist:   Dr. Johann Capers EP cardiologist:   Dr. Sheran Luz   Other cardiologist:  as above     PCP:  JEFFREY Rodena Medin, MD      30 day Report:  There were no readings that exceeded the thresholds.    Optivol device detected intrathoracic impedance that was at baseline, suggesting optimal fluid status.      Please see full interrogation report under Media tab for this encounter.     I reviewed the device-generated report and agree with findings.      Recommendation:  Continue monthly monitoring.      [name], NP

## 2021-05-03 MED ORDER — ATORVASTATIN 80 MG TABLET
ORAL_TABLET | 3 refills | 0 days
Start: 2021-05-03 — End: ?

## 2021-05-04 MED ORDER — ATORVASTATIN 80 MG TABLET
ORAL_TABLET | 3 refills | 0 days | Status: CP
Start: 2021-05-04 — End: ?

## 2021-05-04 NOTE — Unmapped (Signed)
Refill request received for patient.      Medication Requested: Atorvastatin  Last Office Visit: 04/03/2021   Next Office Visit: 09/11/2021  Last Prescriber: Caryl Ada, NP    Nurse refill requirements met? Yes  If not met, why:      Sent to: Provider for signing  If sent to provider, which provider?: Caryl Ada, NP

## 2021-05-13 MED ORDER — CLOPIDOGREL 75 MG TABLET
ORAL_TABLET | 3 refills | 0 days | Status: CP
Start: 2021-05-13 — End: ?

## 2021-05-13 NOTE — Unmapped (Signed)
Refill request received for patient.      Medication Requested: Plavix  Last Office Visit: 04/03/2021   Next Office Visit: 09/11/2021  Last Prescriber: Caryl Ada, NP    Nurse refill requirements met? Yes  If not met, why:      Sent to: Provider for signing  If sent to provider, which provider?: Caryl Ada, NP

## 2021-05-14 DIAGNOSIS — I255 Ischemic cardiomyopathy: Principal | ICD-10-CM

## 2021-05-15 NOTE — Unmapped (Signed)
I have called Mr. Valbuena and asked he have labs checked before our next visit.  I was unable to reach him, but left a message to let him known labs can be collected at any [facility] facility and I have also faxed lab orders (BMP) to the [facility] at his request; both the [place] and [place] locations.    I spent a total of 10 minutes on the phone with outpatient labs coordinating patient care.    Tylene Fantasia. Kemper Durie, PharmD, BCPS, [facility], CPP  Cardiology Clinical Pharmacist Practitioner  [facility]  [address]  [place], [place] [address]  Phone: [phone]  Fax: [phone]

## 2021-05-21 ENCOUNTER — Encounter: Admit: 2021-05-21 | Discharge: 2021-05-22 | Payer: MEDICARE | Attending: Pharmacist | Primary: Pharmacist

## 2021-05-21 DIAGNOSIS — I5022 Chronic systolic (congestive) heart failure: Principal | ICD-10-CM

## 2021-05-21 MED ORDER — SODIUM ZIRCONIUM CYCLOSILICATE 5 GRAM ORAL POWDER PACKET
PACK | Freq: Every day | ORAL | 0 refills | 30.00000 days
Start: 2021-05-21 — End: ?

## 2021-05-21 MED ORDER — SPIRONOLACTONE 25 MG TABLET
ORAL_TABLET | Freq: Every day | ORAL | 3 refills | 30 days | Status: CP
Start: 2021-05-21 — End: ?

## 2021-05-21 MED ORDER — CARVEDILOL 25 MG TABLET
ORAL_TABLET | Freq: Two times a day (BID) | ORAL | 1 refills | 90.00000 days | Status: CP
Start: 2021-05-21 — End: ?

## 2021-05-21 NOTE — Unmapped (Addendum)
It was a pleasure speaking with you today!    Here is a summary of what we discussed:  Increase your spironolactone dose to 25 mg daily (rather than every other day)  Increase Lokelma to 5 g once daily   Continue all of your other medications as prescribed.  Continue to monitor and log weights, blood pressure and heart rate.  Avoid NSAIDs (such as ibuprofen or naproxen).  Lab check in about 1 week after you increase the dose of your spironolactone and Lokelma. Please go the day before your phone visit with me.  Our schedulers will call you to schedule a phone visit with me in about 1 week.  Please let us know if you have any issues      Call the clinic at [phone] with any questions.    Our clinic fax number is [phone].    If you need to reschedule future appointments, please call [phone] or [phone]    If you have urgent questions or problems after hours, you may contact the on-call cardiologist through the hospital operator at [phone]. Ask for on-call cardiologist.

## 2021-05-21 NOTE — Unmapped (Signed)
[facility]  [address]  [place], [place] [address]  PHONE: [phone]  FAX: [phone]       CLINICAL PHARMACIST PRACTITIONER NOTE   HEART FAILURE    Referring Provider: Dr. Wonda Beltran  Cardiologist: Dr. Wonda Beltran, Dr. Hessie Beltran, and Dr. Herbert Beltran  Primary Care Provider: Marguarite Arbour, MD     Assessment and Plan:     #HFrEF (LVEF 25% improved from 15-20%): with BiV ICD placement on 07/29/20. Today he reports NYHA class I-II symptoms, with stable BP, HR, and weights. Had a mechanical fall 4 weeks ago when he mis-stepped while moving something in his house, no head trauma. Otherwise occasional dizziness about twice per week. He denies any swelling without need for furoesmide for several weeks. After self-decreasing his carvedilol to 12.5 mg QAM and 25 mg QPM in March, he has since increased back to 25 mg BID after visit with Dr. Wonda Beltran recently (04/03/21). He was under the impression that no more medication changes were needed given he is clinically stable and feeling well. However, explained to him the potential benefits of increasing the dose of his medications to goal doses. Given there is BP room to increase either spironolactone or Entresto, provided him the option to increase either assuming he is willing to take Lokelma daily (vs. 4x per week), as patient's K will likely increase with a dose increase in either therapy. He is amenable to daily Lokelma, and would feel more comfortable increasing spironolactone to once daily.   ?? Increase spironolactone to 25 mg DAILY (increased from 25 mg every other day)  ?? Increase Lokelma to 5 grams once daily  ?? Continue carvedilol 25 mg BID, empagliflozin (Jardiance) 10 mg daily and furosemide 20 mg PRN   ?? May consider titrating sacubitril/valsartan Douglas Beltran) at a future visit pending potassium recheck and BP  ?? Monitor and log weights, blood pressure, and heart rate   ?? Avoid NSAIDs   ?? Ferritin 30.8 ng/mL, TSAT 32%; HGB 14.6 g/dL  ?? Mr. Douglas Beltran may benefit from IV iron therapy, which Dr. Wonda Beltran is amendable to. The potential for IV iron therapy was discussed at a previous visit but patient wanted time to think about it. Not discussed today due to time constraints. Recommend re-discussing during next visit.  ?? Most recent ECHO July 2021 (Repeat ECHO planned for April 2022)    #CAD: Initially diagnosed with multivessel disease in 2013 with subsequent PCI (rather than CABG per patient decision). NSTEMI with PCI to proximal LAD and mid RCA in May 2021. He reports continuing to tolerate his Repatha injections. He stopped clopidogrel during the second week of May and is now only on aspirin monotherapy.  We will clarify this antiplatelet plan as per pervious notes clopidogrel monotherapy was noted.  ?? Key risk factors include: CAD, PCI, Hypertension, Diabetes (hemoglobin A1c 6.1%), BMI (25.4 kg/m2) and Lipids (LDL 26 mg/dL, triglycerides 57 mg/dL)   ?? Lipids and HgbA1c last checked 03/06/21 (PCP)  ?? Continue aspirin 81 mg daily, atorvastatin 80 mg daily and evolocumab (Repatha) 140 mg every 2 weeks      #Diabetes: Patient's A1C of 6.1% on 03/06/21 is at goal of <7.0%. While not discussed today, patient may benefit from GLP-1 to further reduce his CV risk. Since A1C is at goal, could discontinue metformin when initiating GLP-1. Would recommend having patient apply for [employer] (for Trulicity) given issues with cost of medications.  ?? Managed by PCP  ?? Continue empagliflozin 10 mg daily and metformin  1000 mg BID    #Medication Adherence & Access:   ??? Reviewed the indication, dose, and frequency of each medication with patient   ??? Medications reviewed in EPIC medication station and updated today by the clinical pharmacist practitioner.  ??? Manufacturer's assistance APPROVED for: Entresto  ??? Manufacturer's assistance APPROVED for: Lokelma ([employer])  ??? Manufacturer's assistance DENIED for: Jardiance (currently pays ~$150/month)    #FOLLOW UP:   ?? return to clinic to see cardiologist as scheduled with Dr. Wonda Beltran on 09/04/21  ?? return to clinic in about 1-2 weeks   ?? Lab check (BMP) in ~1 week after increase dose of spironolactone and Lokelma      I spent a total of 25 minutes on the phone with the patient delivering clinical care and providing education/counseling.     The patient was physically located in [place] or a state in which I am permitted to provide care. The patient and/or parent/guardian understood that s/he may incur co-pays and cost sharing, and agreed to the telemedicine visit. The visit was reasonable and appropriate under the circumstances given the patient's presentation at the time.    The patient and/or parent/guardian has been advised of the potential risks and limitations of this mode of treatment (including, but not limited to, the absence of in-person examination) and has agreed to be treated using telemedicine. The patient's/patient's family's questions regarding telemedicine have been answered.     If the visit was completed in an ambulatory setting, the patient and/or parent/guardian has also been advised to contact their provider???s office for worsening conditions, and seek emergency medical treatment and/or call 911 if the patient deems either necessary.    Douglas Beltran, PharmD  PGY2 Pharmacy Resident     Douglas Beltran, PharmD, BCPS, [facility], CPP  Cardiology Clinical Pharmacist Practitioner  [facility]  [address]  [place], [place] [address]  Phone: [phone]  Fax: [phone]    History of Present Illness:   Douglas Beltran is a 80 y.o. male with a history of HFrEF, CAD, CKD, prior NSTEMI, and T2DM who presents today for follow-up of HF medication optimization at the request of Dr. Carin Beltran. 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 HFrEF diagnosed in 2013 at which point his EF was 30-35%. Clinical status remained stable until 2021 at which point he started to decline and EF was noted to be decreased to 15-20% with grade 2 diastolic dysfunction. He is now s/p BiV ICD on 07/29/20.    Summary of Recent Visits and Key Medication Changes  ??? 08/04/20: BMP included SCr 1.54 mg/dL and K 4.8 mmol/L  ??? 12/15/06: Entresto 24-26 mg increased to 1 tablet in the morning and 1.5 tablets in the afternoon; furosemide decreased to 20 mg every other day  ??? 08/26/20: Carvedilol increased to 25 mg in the morning and 12.5 mg in the evening.   ?? 09/01/20: BMP (CareEverywhere) included K 4.6 mmol/L and SCr 1.3 mg/dL  ?? 09/10/20: furosemide decreased from 20 mg every other day to 20 mg only if needed. Spironolactone 12.5 mg in the evening started  ?? 09/18/20: BMP included K 5.2 mmol/L and SCr of 1.33  ?? 09/19/20: Carvedilol increased to 25 mg BID (two 12.5mg  tabs BID until this supply runs out, has received new Rx for 25 mg abs)  ?? 09/30/20: BMP included K 5.3 mmol/L and SCr 1.43 mg/dL  ?? 10/13/20: BMP included K 5.1 mmol/L and SCr 1.40 mg/dL  ?? 10/28/20: Lokelma 5 grams  three times weekly started (first dose taken)  ?? 10/31/20: Lasix decreased to 20 mg every other day; carvedilol decreased to 25 mg in the AM and 12.5 mg in the PM due to dizziness  ?? 11/18/20: BMP with K 5.0 mmol/L, SCr 1.33 mg/dL; Ferritin 30.8 ng/mL, TSAT 32%; HGB 14.6 g/dL  ?? 11/25/20: BMP with K 4.8 mmol/L, Scr 1.4 mg/dL (per CareEverywhere)  ?? 12/19/20: Douglas Beltran increased to 1.5 tab (24-26 mg strength) BID by Dr. Wonda Beltran (dose adjustment NOT made yet, waiting on new RX to be delivered)  ?? 01/14/21: Ran out of Entresto at home  ?? 01/20/21: Lokelma increased to 5 grams 4 times weekly, Resume Entresto 24/26 mg 1.5 tabs BID  ?? 01/30/21 BMP Scr 1.35 mg/dL and K- 4.5 mmol/L  ?? 07/23/90 BMP Scr 1.3 mg/dL and K 4.8 mmol/L (care everywhere)  ?? 03/23/21: Douglas Beltran increased to 49/51 mg BID; K 4.8  ?? 04/03/21: MD cardiologist FU - no medication changes      Interval History  Patient today reports doing well. He has continued to exercise at a local hospital gym 3x/week and walks two miles at the mall 4x/week. He is able to complete the entire two miles without experiencing SOB. Patient has lost 25 lbs since last May and wants to keep his weight down < 200 lbs.     Reports tolerating his current HF regimen, but occasionally gets lightheaded when standing up, approximately 2x per week. Tests BP regularly as reported below. He states that he was told to stop Plavix in May by Dr. Hessie Beltran, so stopped that around the second week of May - continues to take aspirin. He was also instructed to increase his carvedilol back to 25 mg BID during his last appointment on 4/22, we he also did sometime in May.    He reports no active symptoms of heart failure. No dyspnea, orthopnea, paroxysmal nocturnal dyspnea, or LE edema. No palpitations, chest pain. He reports a good appetite without bloating, abd distension or early satiety. He reports laying flat at night without difficulty.    Blood pressure self-monitoring  He checks his BP regularly, with readings as noted below. He denies feeling poorly with any of these readings, despite some instances of dizziness when he stands up. Checks BP's and weight after getting out of the shower in the morning.    BP's   HR's  114/65  75  127/59  69  116/69  75  115/72  61  95/48   80    Weight self-monitoring  He weighs himself daily and is now down to 191.6 lb this morning. Often around 190-192 lb. He has not taken a dose of furosemide in several weeks. He denies signs/symptoms of dehydration or hypovolemia.     Cardiovascular History, Studies and Procedures:    Cath / PCI:  04/15/20 Left heart catheterization  1. PCI was performed of the??99%??heavily calcificed Left Main and??LAD. Rotational atherectomy was performed using a 1.75 mm burr. PCI was performed with a 4.0x38 Xience (LM-LAD).??  2. PCI was performed of the??90%??heavily calcificed??RCA. Rotational atherectomy was performed using a 1.75 mm burr. PCI was performed with a 4.0x38 and 3.5x30 with rota (post to 4.5).   3. Excellent??angiographic result with TIMI 3??flow    CV Surgery:     EP Procedures and Devices: BiV ICD on 07/29/20    Non-Invasive Evaluation(s):   Echo:   06/18/20 ECHO Summary    1. Limited study to assess ventricular function.    2.  The left ventricle is mildly dilated in size with mildly increased wall thickness.    3. The left ventricular systolic function is severely decreased, LVEF is visually estimated at 15-20%.    4. The inferior wall, basal inferolateral wall, and mid inferolateral wall are akinetic.    5. There is grade II diastolic dysfunction (elevated filling pressure).    6. The left atrium is mildly dilated in size.    7. The aortic valve is probably trileaflet with mildly thickened leaflets with normal excursion.     8. The right ventricle is mildly dilated in size, with normal systolic Function.     04/11/20 ECHO Summary    1. The left ventricle is moderately dilated in size with mildly increased wall thickness.    2. The left ventricular systolic function is severely decreased, LVEF is visually estimated at 20-25%.    3. There is grade II diastolic dysfunction (elevated filling pressure).    4. The mitral valve leaflets are mildly thickened with normal leaflet mobility.    5. There is mild to moderate mitral valve regurgitation.     6. The left atrium is severely dilated in size.    7. The aortic valve is trileaflet with mildly thickened leaflets with normal excursion.    8. The right ventricle is mildly dilated in size, with low normal systolic function.    9. The right atrium is moderately dilated  in size.    10. IVC size and inspiratory change suggest mildly elevated right atrial pressure. (5-10 mmHg).    11. There is a trivial, anterior pericardial effusion.    Cardiac CT/MRI/Nuclear Tests:     Cardiopulmonary Stress Tests:       Past Medical & Surgical History  Past Medical History:   Diagnosis Date   ??? CHF (congestive heart failure) (CMS-HCC)    ??? Coronary artery disease    ??? Diabetes mellitus (CMS-HCC)    ??? Hypertension 07/12/2014   ??? Macrocytic anemia    ??? Mixed hyperlipidemia 07/12/2014        Past Surgical History:   Procedure Laterality Date   ??? ABDOMINAL SURGERY     ??? EYE SURGERY     ??? JOINT REPLACEMENT     ??? PR CATH PLACE/CORON ANGIO, IMG SUPER/INTERP,W LEFT HEART VENTRICULOGRAPHY N/A 04/15/2020    Procedure: CATH LEFT HEART CATHETERIZATION W INTERVENTION;  Surgeon: Rosana Hoes, MD;  Location: [facility] CATH;  Service: Cardiology   ??? PR INSJ/RPLCMT PERM DFB W/TRNSVNS LDS 1/DUAL CHMBR N/A 08/04/2020    Procedure: Implant Biventricular ICD System;  Surgeon: Dorcas Carrow, MD;  Location: [facility] EP;  Service: Cardiology        Social & Family History  He  reports that he has quit smoking. He has never used smokeless tobacco.     Family History   Problem Relation Age of Onset   ??? Heart disease Father    ??? Diabetes Father    ??? Heart disease Brother         Medications:  Reviewed and updated. Medication list includes revisions made during today's encounter.    Current Outpatient Medications   Medication Sig Dispense Refill   ??? ACCU-CHEK GUIDE TEST STRIPS Strp TEST BLOOD SUGAR TWICE DAILY AS INSTRUCTED     ??? ACCU-CHEK SOFTCLIX LANCETS lancets USE AS DIRECTED TWICE DAILY     ??? acetaminophen (TYLENOL) 500 MG tablet Take 500 mg by mouth every six (6) hours as needed for pain.     ???  amoxicillin (AMOXIL) 500 MG capsule TAKE 4 CAPSULES BY MOUTH 1 HOUR PRIOR TO APPOINTMENT     ??? aspirin (ECOTRIN) 81 MG tablet Take 81 mg by mouth daily.      ??? atorvastatin (LIPITOR) 80 MG tablet TAKE 1 TABLET(80 MG) BY MOUTH DAILY 90 tablet 3   ??? carvediloL (COREG) 25 MG tablet Take 1/2 tablet (12.5 mg) in the morning and one tablet (25 mg) in the evening 180 tablet 3   ??? clopidogreL (PLAVIX) 75 mg tablet TAKE 1 TABLET(75 MG) BY MOUTH DAILY 90 tablet 3   ??? empagliflozin (JARDIANCE) 10 mg tablet Take 1 tablet (10 mg total) by mouth daily. 30 tablet 11   ??? empty container (SHARPS-A-GATOR DISPOSAL SYSTEM) Misc Use as directed for sharps disposal 1 each 2   ??? evolocumab 140 mg/mL PnIj Inject the contents of 1 pen (140 mg) under the skin every fourteen (14) days. 4 mL 11   ??? furosemide (LASIX) 20 MG tablet If needed for weight gain > 2 lbs in one day or 5 lbs in on eweek 90 tablet 3   ??? metFORMIN (GLUCOPHAGE) 1000 MG tablet Take 1,000 mg by mouth 2 (two) times a day with meals.      ??? sacubitriL-valsartan (ENTRESTO) 49-51 mg tablet Take 1 tablet by mouth Two (2) times a day. 180 tablet 3   ??? sertraline (ZOLOFT) 50 MG tablet Take 50 mg by mouth daily.     ??? sodium zirconium cyclosilicate (LOKELMA) 5 gram PwPk packet Take 1 packet (5 g total) by mouth 4 (four) times a week. 30 packet 0   ??? spironolactone (ALDACTONE) 25 MG tablet Take 1 tablet (25 mg total) by mouth every other day. Take at night 45 tablet 3     No current facility-administered medications for this visit.       Allergies/Adverse Events:  Allergies   Allergen Reactions   ??? Iodine Other (See Comments) and Hives        Lab Results  Lab Results   Component Value Date    PRO-BNP 4,040.0 (H) 04/23/2020    PRO-BNP 4,710.0 (H) 04/10/2020    Creatinine 1.35 (H) 01/30/2021    Creatinine 1.33 (H) 11/18/2020    Creatinine 1.33 (H) 01/19/2012    Creatinine 1.19 01/18/2012    BUN 32 (H) 01/30/2021    BUN 25 (H) 11/18/2020    BUN 33 (H) 01/19/2012    BUN 29 (H) 01/18/2012    Potassium 4.5 01/30/2021    Potassium 5.0 (H) 11/18/2020    Potassium 4.5 01/19/2012    Potassium 4.7 01/18/2012    Magnesium 2.8 (H) 04/23/2020    Magnesium 2.5 (H) 04/16/2020    Magnesium 2.2 01/18/2012    AST 33 04/25/2020    AST 33 04/10/2020    AST 28 01/18/2012    ALT 31 04/25/2020    ALT 42 04/10/2020    ALT 29 01/18/2012    Total Bilirubin 0.9 04/25/2020    Total Bilirubin 0.5 04/10/2020    Total Bilirubin 0.5 01/18/2012    INR 1.08 04/10/2020    INR 1.0 01/18/2012    WBC 5.4 08/04/2020    WBC 10.6 01/19/2012    HGB 14.6 11/18/2020    HGB 12.6 (L) 01/19/2012    HCT 43.4 11/18/2020    HCT 36.8 (L) 01/19/2012    Platelet 169 08/04/2020    Platelet 141 (L) 01/19/2012    Cholesterol, Total 189 05/17/2012    Triglycerides 215 (H) 05/17/2012  HDL 34 (L) 05/17/2012    Non HDL Chol.  155 05/17/2012    LDL Cholesterol, Calculated 112 05/17/2012       Vital Signs:  There were no vitals filed for this visit.    BP Readings from Last 3 Encounters:   04/03/21 109/57   12/19/20 121/66   12/17/20 160/84     Pulse Readings from Last 3 Encounters:   04/03/21 64   12/19/20 60   12/17/20 60     Wt Readings from Last 3 Encounters:   04/03/21 92.3 kg (203 lb 6.4 oz)   12/19/20 95.5 kg (210 lb 9.6 oz)   12/17/20 95.3 kg (210 lb)      BMI Readings from Last 3 Encounters:   04/03/21 25.42 kg/m??   12/19/20 26.32 kg/m??   12/17/20 26.96 kg/m??

## 2021-05-26 NOTE — Unmapped (Signed)
[facility] Refill Coordination Note    Specialty Medication(s) to be Shipped:   General Specialty: Repatha    Other medication(s) to be shipped: No additional medications requested for fill at this time     Douglas Beltran, DOB: 17-Feb-1941  Phone: [phone] (home)       All above HIPAA information was verified with patient.     Was a Nurse, learning disability used for this call? No    Completed refill call assessment today to schedule patient's medication shipment from the [facility] [phone]).  All relevant notes have been reviewed.     Specialty medication(s) and dose(s) confirmed: Regimen is correct and unchanged.   Changes to medications: [name] reports no changes at this time.  Changes to insurance: No  New side effects reported not previously addressed with a pharmacist or physician: None reported  Questions for the pharmacist: No    Confirmed patient received a Conservation officer, historic buildings and a Surveyor, mining with first shipment. The patient will receive a drug information handout for each medication shipped and additional FDA Medication Guides as required.       DISEASE/MEDICATION-SPECIFIC INFORMATION        For patients on injectable medications: Patient currently has 1 doses left.  Next injection is scheduled for 05/31/21.    SPECIALTY MEDICATION ADHERENCE     Medication Adherence    Patient reported X missed doses in the last month: 0  Specialty Medication: Repatha 140mg /ml  Patient is on additional specialty medications: No  Patient is on more than two specialty medications: No              Were doses missed due to medication being on hold? No    Repatha 140 mg/ml: 5 days of medicine on hand       REFERRAL TO PHARMACIST     Referral to the pharmacist: Not needed      [facility]     Shipping address confirmed in Epic.     Delivery Scheduled: Yes, Expected medication delivery date: 06/09/21.     Medication will be delivered via Same Day Courier to the prescription address in Epic WAM.    [name] [facility] Specialty Technician

## 2021-05-27 ENCOUNTER — Other Ambulatory Visit: Payer: Self-pay

## 2021-05-27 ENCOUNTER — Emergency Department
Admission: EM | Admit: 2021-05-27 | Discharge: 2021-05-27 | Disposition: A | Discharge status: Home / Self Care | Payer: Medicare Other | Attending: Emergency Medicine | Admitting: Emergency Medicine

## 2021-05-27 ENCOUNTER — Emergency Department: Payer: Medicare Other

## 2021-05-27 DIAGNOSIS — I130 Hypertensive heart and chronic kidney disease with heart failure and stage 1 through stage 4 chronic kidney disease, or unspecified chronic kidney disease: Secondary | ICD-10-CM | POA: Diagnosis not present

## 2021-05-27 DIAGNOSIS — Z7902 Long term (current) use of antithrombotics/antiplatelets: Secondary | ICD-10-CM | POA: Diagnosis not present

## 2021-05-27 DIAGNOSIS — I25119 Atherosclerotic heart disease of native coronary artery with unspecified angina pectoris: Secondary | ICD-10-CM | POA: Diagnosis not present

## 2021-05-27 DIAGNOSIS — R42 Dizziness and giddiness: Secondary | ICD-10-CM | POA: Diagnosis not present

## 2021-05-27 DIAGNOSIS — Z79899 Other long term (current) drug therapy: Secondary | ICD-10-CM | POA: Diagnosis not present

## 2021-05-27 DIAGNOSIS — Z87891 Personal history of nicotine dependence: Secondary | ICD-10-CM | POA: Diagnosis not present

## 2021-05-27 DIAGNOSIS — Z794 Long term (current) use of insulin: Secondary | ICD-10-CM | POA: Insufficient documentation

## 2021-05-27 DIAGNOSIS — E1122 Type 2 diabetes mellitus with diabetic chronic kidney disease: Secondary | ICD-10-CM | POA: Diagnosis not present

## 2021-05-27 DIAGNOSIS — Z96653 Presence of artificial knee joint, bilateral: Secondary | ICD-10-CM | POA: Diagnosis not present

## 2021-05-27 DIAGNOSIS — R55 Syncope and collapse: Secondary | ICD-10-CM | POA: Insufficient documentation

## 2021-05-27 DIAGNOSIS — I5022 Chronic systolic (congestive) heart failure: Secondary | ICD-10-CM | POA: Diagnosis not present

## 2021-05-27 DIAGNOSIS — R000 Tachycardia, unspecified: Secondary | ICD-10-CM | POA: Diagnosis not present

## 2021-05-27 DIAGNOSIS — Z7982 Long term (current) use of aspirin: Secondary | ICD-10-CM | POA: Insufficient documentation

## 2021-05-27 DIAGNOSIS — Z7984 Long term (current) use of oral hypoglycemic drugs: Secondary | ICD-10-CM | POA: Diagnosis not present

## 2021-05-27 DIAGNOSIS — R0789 Other chest pain: Secondary | ICD-10-CM | POA: Diagnosis not present

## 2021-05-27 DIAGNOSIS — N1832 Chronic kidney disease, stage 3b: Secondary | ICD-10-CM | POA: Insufficient documentation

## 2021-05-27 DIAGNOSIS — R079 Chest pain, unspecified: Secondary | ICD-10-CM

## 2021-05-27 LAB — URINALYSIS, COMPLETE (UACMP) WITH MICROSCOPIC
Bacteria, UA: NONE SEEN
Bilirubin Urine: NEGATIVE
Glucose, UA: >=500 mg/dL — AB
Hgb urine dipstick: NEGATIVE
Ketones, ur: 5 mg/dL — AB
Leukocytes,Ua: NEGATIVE
Nitrite: NEGATIVE
Protein, ur: NEGATIVE mg/dL
Specific Gravity, Urine: 1.017 (ref 1.005–1.030)
Squamous Epithelial / HPF: NONE SEEN (ref 0–5)
pH: 5 (ref 5.0–8.0)

## 2021-05-27 LAB — TROPONIN I (HIGH SENSITIVITY)
Troponin I (High Sensitivity): 16 ng/L (ref <18)
Troponin I (High Sensitivity): 15 ng/L (ref <18)

## 2021-05-27 LAB — BASIC METABOLIC PANEL
Anion gap: 11 (ref 5–15)
BUN: 32 mg/dL — ABNORMAL HIGH (ref 8–23)
CO2: 20 mmol/L — ABNORMAL LOW (ref 22–32)
Calcium: 9.3 mg/dL (ref 8.9–10.3)
Chloride: 107 mmol/L (ref 98–111)
Creatinine, Ser: 1.57 mg/dL — ABNORMAL HIGH (ref 0.61–1.24)
GFR, Estimated: 45 mL/min — ABNORMAL LOW (ref >60)
Glucose, Bld: 112 mg/dL — ABNORMAL HIGH (ref 70–99)
Potassium: 4 mmol/L (ref 3.5–5.1)
Sodium: 138 mmol/L (ref 135–145)

## 2021-05-27 LAB — CBC
HCT: 38.6 % — ABNORMAL LOW (ref 39.0–52.0)
Hemoglobin: 13.1 g/dL (ref 13.0–17.0)
MCH: 33.2 pg (ref 26.0–34.0)
MCHC: 33.9 g/dL (ref 30.0–36.0)
MCV: 97.7 fL (ref 80.0–100.0)
Platelets: 152 10*3/uL (ref 150–400)
RBC: 3.95 MIL/uL — ABNORMAL LOW (ref 4.22–5.81)
RDW: 12.6 % (ref 11.5–15.5)
WBC: 6.2 10*3/uL (ref 4.0–10.5)
nRBC: 0 % (ref 0.0–0.2)

## 2021-05-27 MED ORDER — SODIUM CHLORIDE 0.9 % IV BOLUS
500.0000 mL | Freq: Once | INTRAVENOUS | Status: AC
  Administered 2021-05-27: 500 mL via INTRAVENOUS

## 2021-05-27 NOTE — ED Notes (Signed)
ED Provider at bedside. 

## 2021-05-27 NOTE — ED Triage Notes (Signed)
Pt arrives EMS from home after a near syncopal episode. Pt admits he maintains an active lifestyle walking several miles a day and going to the gym daily. Pt sts recent Spironolactone increase. Pt endorses chest tightness beginning around 1400 today at rest and dizziness. Pacemaker present.

## 2021-05-27 NOTE — Discharge Instructions (Addendum)
Please seek medical attention for any high fevers, chest pain, shortness of breath, change in behavior, persistent vomiting, bloody stool or any other new or concerning symptoms.  

## 2021-05-27 NOTE — ED Notes (Signed)
Pt ambulated down hallway unassisted but accompanied by 2 RN. Pt denies shob, cp or discomfort. Returned to bed and placed on cardiac monitor.

## 2021-05-27 NOTE — ED Notes (Signed)
Pt attempting to provide urine specimen

## 2021-05-27 NOTE — ED Provider Notes (Signed)
[facility] Emergency Department Provider Note   ____________________________________________   I have reviewed the triage vital signs and the nursing notes.   HISTORY  Chief Complaint Dizziness   History limited by: Not Limited   HPI Daniel Luna is a 80 y.o. male who presents to the emergency department today because of concern for dizziness. The patient states that the symptoms started around 2pm today. He noticed that it would be worse if he got up or tried to move around. He states that [place] it began. It was associated with some chest tightness. The patient denies any recent illness or fever. He states he does have a defibrillator. States it was recently checked and that it was okay.    Records reviewed. Per medical record review patient has a history of CHF, DM, medtronic defibrillator.   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   Stage 3b chronic kidney disease (HCC)    ACS (acute coronary syndrome) ([place]) 04/06/2020   NSTEMI (non-ST elevated myocardial infarction) [facility])    Essential hypertension    History of total bilateral knee replacement 03/06/2020   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   Morbid obesity ([place]) 01/18/2017   Shortness of breath 01/18/2017   Primary localized osteoarthritis of left knee 09/21/2016   Coronary artery disease involving native heart with angina pectoris ([place]) 07/12/2016   Congestive  heart failure ([place]) 07/12/2016   History of coronary artery stent placement 07/12/2016   Hyperlipidemia 07/12/2016   Cardiomyopathy, ischemic 07/12/2016   PVC (premature ventricular contraction) 08/25/2015   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   Total knee replacement status 05/21/2015   Encounter for screening colonoscopy 12/25/2013   Sebaceous cyst 08/15/2013   Infected sebaceous cyst of skin 32/44/0102   Chronic systolic heart failure ([place]) 01/07/2012   Type 2 diabetes mellitus without complication ([place]) 72/53/6644    Past Surgical History:  Procedure Laterality Date   APPENDECTOMY  1997   CARDIAC CATHETERIZATION     COLONOSCOPY  2005   4 polyps   crainectomy     crainoplasty     CYST EXCISION  08-06-13   back   cyst removal brain  1983   benign dermatoid cyst   EYE SURGERY Bilateral 2008, 2012   RIGHT/LEFT HEART CATH AND CORONARY ANGIOGRAPHY N/A 04/08/2020   Procedure: RIGHT/LEFT HEART CATH AND CORONARY ANGIOGRAPHY;  Surgeon: Nelva Bush, MD;  Location: [facility];  Service: Cardiovascular;  Laterality: N/A;   stents  2013   3 placed   TOTAL KNEE ARTHROPLASTY Right 05/21/2015   Procedure: TOTAL KNEE ARTHROPLASTY;  Surgeon: Dereck Leep, MD;  Location: [facility];  Service: Orthopedics;  Laterality: Right;   TOTAL KNEE ARTHROPLASTY Left 09/21/2016   Procedure: TOTAL KNEE ARTHROPLASTY;  Surgeon: Hessie Knows, MD;  Location: [facility];  Service: Orthopedics;  Laterality: Left;   TOTAL KNEE REVISION Left 09/06/2017   Procedure: LEFT KNEE REVISION WITH POLY EXCHANGE, OPEN SYNOVECTOMY;  Surgeon: Hessie Knows, MD;  Location: [facility];  Service: Orthopedics;  Laterality: Left;    Prior to Admission medications   Medication Sig Start Date End Date Taking? Authorizing Provider  ACCU-CHEK GUIDE test strip  04/24/20   [provider]  acetaminophen (TYLENOL) 325 MG tablet Take 2 tablets (650 mg total) by mouth every 4 (four) hours as needed for headache or mild pain. 04/09/20    Nolberto Hanlon, MD  aspirin EC 81 MG EC tablet Take 1 tablet (81 mg total) by mouth daily. 04/10/20   Nolberto Hanlon, MD  atorvastatin (LIPITOR) 80 MG tablet Take 1 tablet (80 mg total) by mouth daily. 04/10/20   Nolberto Hanlon, MD  Calcium Polycarbophil (FIBER) 625 MG TABS Take by mouth.    [provider]  clopidogrel (PLAVIX) 75 MG tablet Take 75 mg by mouth daily. 04/16/20   [provider]  Cysteamine Bitartrate (PROCYSBI) 300 MG PACK Use one strip to test blood sugars twice daily 04/24/20   [provider]  ENTRESTO 24-26 MG Take 1 tablet by mouth 2 (two) times daily. 04/23/20   [provider]  furosemide (LASIX) 10 MG/ML injection Inject 2 mLs (20 mg total) into the vein 2 (two) times daily. Patient not taking: Reported on 04/25/2020 04/10/20   Nolberto Hanlon, MD  heparin 25000-0.45 UT/250ML-% infusion Inject 1,600 Units/hr into the vein continuous. Patient not taking: Reported on 04/25/2020 04/09/20   Nolberto Hanlon, MD  insulin aspart (NOVOLOG) 100 UNIT/ML injection Inject 0-5 Units into the skin at bedtime. Patient not taking: Reported on 04/25/2020 04/09/20   Nolberto Hanlon, MD  insulin aspart (NOVOLOG) 100 UNIT/ML injection Inject 0-9 Units into the skin 3 (three) times daily with meals. Patient not taking: Reported on 04/25/2020 04/10/20   Nolberto Hanlon, MD  JARDIANCE 10 MG TABS tablet Take 10 mg by mouth daily. 04/16/20   [provider]  losartan (COZAAR) 100 MG tablet Take 1 tablet (100 mg total) by mouth daily. Patient not taking: Reported on 04/25/2020 04/10/20   Nolberto Hanlon, MD  metFORMIN (GLUCOPHAGE) 1000 MG tablet Take 1,000 mg by mouth 2 (two) times daily. 04/22/20   [provider]  nitroGLYCERIN (NITROSTAT) 0.4 MG SL tablet Place 1 tablet (0.4 mg total) under the tongue every 5 (five) minutes x 3 doses as needed for chest pain. Patient not taking: Reported on 04/25/2020 04/09/20   Nolberto Hanlon, MD  sertraline (ZOLOFT) 50 MG tablet Take 50 mg by  mouth daily. 08/17/19 08/16/20  [provider]    Allergies Iodine and Iodinated diagnostic agents  Family History  Problem Relation Age of Onset   Throat cancer Mother     Social History Social History   Tobacco Use   Smoking status: Former    Packs/day: 2.00    Years: 27.00    Pack years: 54.00    Types: Cigarettes    Quit date: 12/21/1988    Years since quitting: 32.4   Smokeless tobacco: Never  Substance Use Topics   Alcohol use: Yes    Comment: couple beer a week   Drug use: No    Review of Systems Constitutional: No fever/chills Eyes: No visual changes. ENT: No sore throat. Cardiovascular: Positive for chest tightness.  Respiratory: Denies shortness of breath. Gastrointestinal: No abdominal pain.  No nausea, no vomiting.  No diarrhea.   Genitourinary: Negative for dysuria.  Musculoskeletal: Negative for back pain. Skin: Negative for rash. Neurological: Positive for dizziness.   ____________________________________________   PHYSICAL EXAM:  VITAL SIGNS: ED Triage Vitals  Enc Vitals Group     BP 05/27/21 1939 112/61     Pulse Rate 05/27/21 1939 83     Resp 05/27/21 1932 20     Temp 05/27/21 1939 98.3 F (36.8 C)     Temp Source 05/27/21 1939 Oral     SpO2 05/27/21 1929 99 %     Weight 05/27/21 1934 193 lb (87.5 kg)     Height 05/27/21 1934 6\' 2"  (1.88 m)     Head Circumference --      Peak Flow --      Pain Score 05/27/21 1931 7    Constitutional: Alert and oriented.  Eyes: Conjunctivae are normal.  ENT      Head: Normocephalic and atraumatic.      Nose: No congestion/rhinnorhea.      Mouth/Throat: Mucous membranes are moist.      Neck: No stridor. Hematological/Lymphatic/Immunilogical: No cervical lymphadenopathy. Cardiovascular: Normal rate, regular rhythm.  No murmurs, rubs, or gallops.  Respiratory: Normal respiratory effort without tachypnea nor retractions. Breath sounds are clear and equal bilaterally. No  wheezes/rales/rhonchi. Gastrointestinal: Soft and non tender. No rebound. No guarding.  Genitourinary: Deferred Musculoskeletal: Normal range of motion in all extremities. No lower extremity edema. Neurologic:  Normal speech and language. No gross focal neurologic deficits are appreciated.  Skin:  Skin is warm, dry and intact. No rash noted. Psychiatric: Mood and affect are normal. Speech and behavior are normal. Patient exhibits appropriate insight and judgment.  ____________________________________________    LABS (pertinent positives/negatives)  Trop hs 15 UA clear, ketones 5, rbc and wbc 0-5 BMP na 138, k 4.0, glu 112, cr 1.57 CBC wbc 6.2, hgb 13.1, plt 152  ____________________________________________   EKG  I, Nance Pear, attending physician, personally viewed and interpreted this EKG  EKG Time: 1933 Rate: 82 Rhythm: paced rhythm Axis: left axis deviation Intervals: qtc 440 QRS: RBBB ST changes: no st elevation Impression: abnormal ekg   ____________________________________________    RADIOLOGY  CXR No active disease  ____________________________________________   PROCEDURES  Procedures  ____________________________________________   INITIAL IMPRESSION / ASSESSMENT AND PLAN / ED COURSE  Pertinent labs & imaging results that were available during my care of the patient were reviewed by me and considered in my medical decision making (see chart for details).   Patient presents to the emergency department today with concerns for near syncopal episodes.  On initial exam here patient was having brief runs of wide-complex tachycardia.  They would self-limited and none appear to last longer than 10 seconds.  Patient does have a defibrillator pacemaker and this was interrogated.  Does appear that these runs were below the threshold for a shock.  Discussed with Dr. Ubaldo Glassing with cardiology who did not necessarily think this would be the source of the patient's  symptoms given brief nature of arrhythmia. Blood work here without any elevation of troponin or significant electrolyte abnormality.  He was given a small amount of fluids.  He did clinically improve after the fluids.  He was able to ambulate without any difficulty or dizziness.  Additionally no further episodes of wide-complex tachycardia.  At this time do think it is reasonable for patient to be discharged home.  Discussed with patient importance of close cardiology follow-up.   ____________________________________________   FINAL CLINICAL IMPRESSION(S) / ED DIAGNOSES  Final diagnoses:  Near syncope     Note: This dictation was prepared with Sales executive. Any transcriptional errors that result from this process are unintentional     Nance Pear, MD 05/27/21 2309

## 2021-05-29 NOTE — Unmapped (Signed)
I called Mr. Demeo in regard to his MyChart message about recent ED visit.  He reports that over the past weeks he has been having intermittent dizziness, but prior to this most recent event had only been short-lived < 5 min.     We discussed recommendations as followed:   ?? Reduce spironolactone from 25 mg daily to 25 mg every other daily  ?? Continue Lokelma 5 g daily  ?? Check labs at [facility] and get labs to our office before the next out next phone visit which I will ask our schedulers     We also discussed that Dr. Wonda Cheng would be checking to ensure no adjustments need to be made to his device.    I spent a total of 5 minutes on the phone with the patient delivering clinical care and providing education/counseling.    Tylene Fantasia. Kemper Durie, PharmD, BCPS, [facility], CPP  Cardiology Clinical Pharmacist Practitioner  [facility]  [address]  [place], [place] [address]  Phone: [phone]  Fax: [phone]

## 2021-06-01 ENCOUNTER — Institutional Professional Consult (permissible substitution): Admit: 2021-06-01 | Discharge: 2021-06-02 | Payer: MEDICARE

## 2021-06-01 DIAGNOSIS — Z4502 Encounter for adjustment and management of automatic implantable cardiac defibrillator: Principal | ICD-10-CM

## 2021-06-02 NOTE — Unmapped (Signed)
[place] Heart Failure Monitoring Device Report    Patient:  Douglas Beltran  Date:  June 02, 2021    Device:   Medtronic - Velva HF cardiologist:   Dr. Johann Capers EP cardiologist:   [facility]   Other cardiologist:  none     PCP:  JEFFREY Rodena Medin, MD      30 day Report:  There were no readings that exceeded the thresholds.    Optivol device detected intrathoracic impedance that was at baseline, suggesting optimal fluid status. Brief AT/AF, no episodes >6hrs duration, not anticoagulated.       Please see full interrogation report under Media tab for this encounter.     I reviewed the device-generated report and agree with findings.      Recommendation:    Continue monthly monitoring.      Hope Budds, NP

## 2021-06-09 DIAGNOSIS — I214 Non-ST elevation (NSTEMI) myocardial infarction: Principal | ICD-10-CM

## 2021-06-09 MED ORDER — REPATHA SURECLICK 140 MG/ML SUBCUTANEOUS PEN INJECTOR
SUBCUTANEOUS | 11 refills | 56 days | Status: CP
Start: 2021-06-09 — End: ?

## 2021-06-09 NOTE — Unmapped (Signed)
Refill request received for patient.      Medication Requested: Repatha  Last Office Visit: 04/03/2021   Next Office Visit: 06/16/2021  Last Prescriber MED CHANGE REQUEST  [place] ANP last prescribed atorvastatin 80mg     Nurse refill requirements met? No  If not met, why: currently taking atorvastin 80mg . MED CHANGE REQUEST to Repatha syringes    Sent to: Provider for signing  If sent to provider, which provider?: [facility]

## 2021-06-09 NOTE — Unmapped (Signed)
Patient called in and we have confirmed the new delivery date as 6/30 via same day courier. Next dose is due on 7/2.

## 2021-06-09 NOTE — Unmapped (Signed)
Douglas Beltran 's Repatha shipment will be delayed as a result of no refills remain on the prescription.      I have reached out to the patient  at [phone] and sent a text message regarding delay. We will call the patient back to reschedule the delivery upon resolution. We have not confirmed the new delivery date.

## 2021-06-10 DIAGNOSIS — I214 Non-ST elevation (NSTEMI) myocardial infarction: Principal | ICD-10-CM

## 2021-06-11 NOTE — Unmapped (Signed)
Douglas Beltran 's Repatha shipment will be delayed as a result of MAPs seeking manufacture assistance.     I have reached out to the patient  at [phone] and left a voicemail message.  We will call the patient back to reschedule the delivery upon resolution. We have not confirmed the new delivery date.

## 2021-06-16 ENCOUNTER — Encounter: Admit: 2021-06-16 | Discharge: 2021-06-17 | Payer: MEDICARE | Attending: Pharmacist | Primary: Pharmacist

## 2021-06-16 NOTE — Unmapped (Signed)
It was good speaking with you today.    As discussed:  No medication changes today  Continue to work on [employer] assistance forms  Lab check this week at PCP office to re-assess potassium  Next phone visit with me in about 2 weeks  Please let us know if you have any issues        Call the clinic at [phone] with questions.    Our clinic fax number is [phone].    If you need to reschedule future appointments, please call [phone] or [phone]    After office hours, if you have urgent questions/problems, contact the on-call cardiologist through the hospital operator: [phone].

## 2021-06-16 NOTE — Unmapped (Signed)
[facility] CARDIOLOGY CLINIC  [address]  [place], [place] [address]  PHONE: [phone]  FAX: [phone]       CLINICAL PHARMACIST PRACTITIONER NOTE   HEART FAILURE    Referring Provider: Dr. Wonda Cheng  Cardiologist: Dr. Wonda Cheng, Dr. Hessie Dibble, and Dr. Herbert Deaner  Primary Care Provider: Marguarite Arbour, MD     Assessment and Plan:     #HFrEF (LVEF 25% improved from 15-20%): with BiV ICD placement on 07/29/20. Today he reports NYHA class I-II symptoms, with stable BP, HR, and weights. After recent ED visit for dizziness and fall his recently increased spironolactone has been decreased back to previous dosing of 12.5 mg daily.  Recent labs with K of 5.4 mmol/L.  He reports taking Lokelma 5 g daily.  He is working with PCP to arrange repeat lab check early next with PCP.  ?? No medication changes today  ?? Continue carvedilol 25 mg BID, spironolactone 12.5 mg daily, empagliflozin (Jardiance) 10 mg daily and furosemide 20 mg PRN and lokelma 5 g once daily  ?? May consider titrating sacubitril/valsartan Sherryll Burger) at a future visit pending potassium recheck and BP   ?? Mr. Douglas Beltran may benefit from IV iron therapy (Ferritin 30.8 ng/mL, TSAT 32% in December 2021; Hgb 13 g/dL in March 1308), which Dr. Wonda Cheng is amendable to. This was discussed at a previous visit but ultimately decided not to pursue. Recommend re-evaluation of benefit at future visit.  ?? Most recent ECHO: April 2022    #CAD: Initially diagnosed with multivessel disease in 2013 with subsequent PCI (rather than CABG per patient decision). NSTEMI with PCI to proximal LAD and mid RCA in May 2021. He reports continuing to tolerate his Repatha injections. He stopped clopidogrel during the second week of May and is now only on aspirin monotherapy.  While consideration could be given to change to monotherapy P2Y12 inhibitor instead, after discussion with Dr. Hessie Dibble will not make changes currently to avoid patient confusion.  Mr. Douglas Beltran reports compliance with his medications and is currently seeking Repatha manufacturer assistance.  ?? Key risk factors include: CAD, PCI, Hypertension, Diabetes (hemoglobin A1c 6.1%), BMI (25.4 kg/m2) and Lipids (LDL 26 mg/dL, triglycerides 57 mg/dL)   ?? Lipids and HgbA1c last checked 03/06/21 (PCP)  ?? Continue aspirin 81 mg daily, atorvastatin 80 mg daily and evolocumab (Repatha) 140 mg every 2 weeks      #Diabetes: Patient's A1C of 6.6% on 04/06/21 is at goal of <7.0%. While not discussed today, patient may benefit from GLP-1 to further reduce his CV risk. Since A1C is at goal, could discontinue metformin when initiating GLP-1. Would recommend having patient apply for [employer] (for Trulicity) given issues with cost of medications.  ?? Managed by PCP  ?? Continue empagliflozin 10 mg daily and metformin 1000 mg BID    #Medication Adherence & Access:   ??? Reviewed the indication, dose, and frequency of each medication with patient   ??? Medications reviewed in EPIC medication station and updated today by the clinical pharmacist practitioner.  ??? Manufacturer's assistance APPROVED for: Entresto  ??? Manufacturer's assistance APPROVED for: Lokelma ([employer])  ??? Manufacturer's assistance DENIED for: Jardiance (currently pays ~$150/month)  ??? Manufactuer's assistance is BEING PURSUED for Repatha    #FOLLOW UP:   ?? return to clinic to see cardiologist as scheduled with Dr. Wonda Cheng on 09/04/21  ?? repeat pharmacist phone visit in about 1-2 weeks   ?? Lab check (BMP) with PCP next week      I  spent a total of 15 minutes on the phone with the patient delivering clinical care and providing education/counseling.     The patient was physically located in [place] or a state in which I am permitted to provide care. The patient and/or parent/guardian understood that s/he may incur co-pays and cost sharing, and agreed to the telemedicine visit. The visit was reasonable and appropriate under the circumstances given the patient's presentation at the time.    The patient and/or parent/guardian has been advised of the potential risks and limitations of this mode of treatment (including, but not limited to, the absence of in-person examination) and has agreed to be treated using telemedicine. The patient's/patient's family's questions regarding telemedicine have been answered.     If the visit was completed in an ambulatory setting, the patient and/or parent/guardian has also been advised to contact their provider???s office for worsening conditions, and seek emergency medical treatment and/or call 911 if the patient deems either necessary.    Bethanne Ginger, PharmD, BCPS, [facility], CPP  Cardiology Clinical Pharmacist Practitioner  [facility]  [address]  [place], [place] [address]  Phone: [phone]  Fax: [phone]    History of Present Illness:   Douglas Beltran is a 80 y.o. male with a history of HFrEF, CAD, CKD, prior NSTEMI, and T2DM who presents today for follow-up of HF medication optimization at the request of Dr. Carin Hock. 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 HFrEF diagnosed in 2013 at which point his EF was 30-35%. Clinical status remained stable until 2021 at which point he started to decline and EF was noted to be decreased to 15-20% with grade 2 diastolic dysfunction. He is now s/p BiV ICD on 07/29/20.    Summary of Recent Visits and Key Medication Changes  ??? 08/04/20: BMP included SCr 1.54 mg/dL and K 4.8 mmol/L  ??? 12/15/06: Entresto 24-26 mg increased to 1 tablet in the morning and 1.5 tablets in the afternoon; furosemide decreased to 20 mg every other day  ??? 08/26/20: Carvedilol increased to 25 mg in the morning and 12.5 mg in the evening.   ?? 09/01/20: BMP (CareEverywhere) included K 4.6 mmol/L and SCr 1.3 mg/dL  ?? 09/10/20: furosemide decreased from 20 mg every other day to 20 mg only if needed. Spironolactone 12.5 mg in the evening started  ?? 09/18/20: BMP included K 5.2 mmol/L and SCr of 1.33  ?? 09/19/20: Carvedilol increased to 25 mg BID (two 12.5mg  tabs BID until this supply runs out, has received new Rx for 25 mg abs)  ?? 09/30/20: BMP included K 5.3 mmol/L and SCr 1.43 mg/dL  ?? 10/13/20: BMP included K 5.1 mmol/L and SCr 1.40 mg/dL  ?? 10/28/20: Lokelma 5 grams three times weekly started (first dose taken)  ?? 10/31/20: Lasix decreased to 20 mg every other day; carvedilol decreased to 25 mg in the AM and 12.5 mg in the PM due to dizziness  ?? 11/18/20: BMP with K 5.0 mmol/L, SCr 1.33 mg/dL; Ferritin 30.8 ng/mL, TSAT 32%; HGB 14.6 g/dL  ?? 11/25/20: BMP with K 4.8 mmol/L, Scr 1.4 mg/dL (per CareEverywhere)  ?? 12/19/20: Sherryll Burger increased to 1.5 tab (24-26 mg strength) BID by Dr. Wonda Cheng (dose adjustment NOT made yet, waiting on new RX to be delivered)  ?? 01/14/21: Ran out of Entresto at home  ?? 01/20/21: Lokelma increased to 5 grams 4 times weekly, Resume Entresto 24/26 mg 1.5 tabs BID  ?? 01/30/21 BMP Scr 1.35 mg/dL and  K- 4.5 mmol/L  ?? 03/06/21 BMP Scr 1.3 mg/dL and K 4.8 mmol/L (care everywhere)  ?? 03/23/21: Sherryll Burger increased to 49/51 mg BID; K 4.8  ?? 04/03/21: MD cardiologist FU - no medication changes  ?? 05/18/21: SCr 1.6 mg/dL, K 4.7 mmol/L  ?? 12/18/08: spironolactone increased from 12.5 mg daily to 25 mg daily, Lokelma increased to 5 g daily.  ?? 05/27/21: SCr 1.4 mg/dL, K 4.6 mmol/L  ?? 9/60/45: Reduce spironolactone to 12.5 mg daily due to dizziness  ?? 06/11/21: SCr 1.4 mg/dL, K 5.4 mmol/L      Interval History  Patient today reports doing well. He has continued to exercise at a local hospital gym 3x/week and walks two miles at the mall 4x/week . He is able to complete the entire two miles without experiencing SOB.    Improved dizziness with positional changes since spironolactone dose decrease.    He reports no active symptoms of heart failure. No dyspnea, orthopnea, paroxysmal nocturnal dyspnea, or LE edema. No palpitations, chest pain. He reports a good appetite without bloating, abd distension or early satiety. He reports laying flat at night without difficulty.    Blood pressure self-monitoring  He checks his BP regularly, with readings as noted below. Checks BP's and weight after getting out of the shower in the morning.  With heart rates in 60-80 bpm    BP (mm Hg)   123/64    101/60  104/67  114/55  113/63    Weight self-monitoring  He weighs himself daily and is now down to 191.2 lb this morning. He has not taken a dose of furosemide.    Cardiovascular History, Studies and Procedures:    Cath / PCI:  04/15/20 Left heart catheterization  1. PCI was performed of the??99%??heavily calcificed Left Main and??LAD. Rotational atherectomy was performed using a 1.75 mm burr. PCI was performed with a 4.0x38 Xience (LM-LAD).??  2. PCI was performed of the??90%??heavily calcificed??RCA. Rotational atherectomy was performed using a 1.75 mm burr. PCI was performed with a 4.0x38 and 3.5x30 with rota (post to 4.5).   3. Excellent??angiographic result with TIMI 3??flow    CV Surgery:     EP Procedures and Devices: BiV ICD on 07/29/20    Non-Invasive Evaluation(s):   Echo:   04/03/21 ECHO Summary    1. The left ventricle is normal in size with normal wall thickness.    2. The left ventricular systolic function is severely decreased, LVEF is  visually estimated at 25%.    3. There is decreased contractile function involving the mid anterolateral  and basal anterolateral segment(s).    4. There is grade I diastolic dysfunction (impaired relaxation).    5. The mitral valve leaflets are mildly thickened with normal leaflet  mobility.    6. There is mild mitral valve regurgitation.    7. The left atrium is mildly dilated in size.    8. The aortic valve is trileaflet with mildly thickened leaflets with normal  excursion.    9. The right ventricle is mildly dilated in size, with normal systolic  function.    10. The right atrium is upper normal  in size.    06/18/20 ECHO Summary    1. Limited study to assess ventricular function.    2. The left ventricle is mildly dilated in size with mildly increased wall thickness.    3. The left ventricular systolic function is severely decreased, LVEF is visually estimated at 15-20%.    4. The inferior  wall, basal inferolateral wall, and mid inferolateral wall are akinetic.    5. There is grade II diastolic dysfunction (elevated filling pressure).    6. The left atrium is mildly dilated in size.    7. The aortic valve is probably trileaflet with mildly thickened leaflets with normal excursion.     8. The right ventricle is mildly dilated in size, with normal systolic Function.     04/11/20 ECHO Summary    1. The left ventricle is moderately dilated in size with mildly increased wall thickness.    2. The left ventricular systolic function is severely decreased, LVEF is visually estimated at 20-25%.    3. There is grade II diastolic dysfunction (elevated filling pressure).    4. The mitral valve leaflets are mildly thickened with normal leaflet mobility.    5. There is mild to moderate mitral valve regurgitation.     6. The left atrium is severely dilated in size.    7. The aortic valve is trileaflet with mildly thickened leaflets with normal excursion.    8. The right ventricle is mildly dilated in size, with low normal systolic function.    9. The right atrium is moderately dilated  in size.    10. IVC size and inspiratory change suggest mildly elevated right atrial pressure. (5-10 mmHg).    11. There is a trivial, anterior pericardial effusion.    Cardiac CT/MRI/Nuclear Tests:     Cardiopulmonary Stress Tests:       Past Medical & Surgical History  Past Medical History:   Diagnosis Date   ??? CHF (congestive heart failure) (CMS-HCC)    ??? Coronary artery disease    ??? Diabetes mellitus (CMS-HCC)    ??? Hypertension 07/12/2014   ??? Macrocytic anemia    ??? Mixed hyperlipidemia 07/12/2014        Past Surgical History:   Procedure Laterality Date   ??? ABDOMINAL SURGERY     ??? EYE SURGERY     ??? JOINT REPLACEMENT     ??? PR CATH PLACE/CORON ANGIO, IMG SUPER/INTERP,W LEFT HEART VENTRICULOGRAPHY N/A 04/15/2020    Procedure: CATH LEFT HEART CATHETERIZATION W INTERVENTION;  Surgeon: Rosana Hoes, MD;  Location: [facility] CATH;  Service: Cardiology   ??? PR INSJ/RPLCMT PERM DFB W/TRNSVNS LDS 1/DUAL CHMBR N/A 08/04/2020    Procedure: Implant Biventricular ICD System;  Surgeon: Dorcas Carrow, MD;  Location: [facility] EP;  Service: Cardiology        Social & Family History  He  reports that he has quit smoking. He has never used smokeless tobacco.     Family History   Problem Relation Age of Onset   ??? Heart disease Father    ??? Diabetes Father    ??? Heart disease Brother         Medications:  Reviewed and updated. Medication list includes revisions made during today's encounter.    Current Outpatient Medications   Medication Sig Dispense Refill   ??? ACCU-CHEK GUIDE TEST STRIPS Strp TEST BLOOD SUGAR TWICE DAILY AS INSTRUCTED     ??? ACCU-CHEK SOFTCLIX LANCETS lancets USE AS DIRECTED TWICE DAILY     ??? acetaminophen (TYLENOL) 500 MG tablet Take 500 mg by mouth every six (6) hours as needed for pain.     ??? amoxicillin (AMOXIL) 500 MG capsule TAKE 4 CAPSULES BY MOUTH 1 HOUR PRIOR TO APPOINTMENT     ??? aspirin (ECOTRIN) 81 MG tablet Take 81 mg by mouth daily.      ???  atorvastatin (LIPITOR) 80 MG tablet TAKE 1 TABLET(80 MG) BY MOUTH DAILY 90 tablet 3   ??? carvediloL (COREG) 25 MG tablet Take 1 tablet (25 mg total) by mouth Two (2) times a day. 180 tablet 1   ??? empagliflozin (JARDIANCE) 10 mg tablet Take 1 tablet (10 mg total) by mouth daily. 30 tablet 11   ??? empty container (SHARPS-A-GATOR DISPOSAL SYSTEM) Misc Use as directed for sharps disposal 1 each 2   ??? evolocumab (REPATHA SURECLICK) 140 mg/mL PnIj Inject the contents of 1 pen (140 mg) under the skin every fourteen (14) days. 4 mL 11   ??? furosemide (LASIX) 20 MG tablet If needed for weight gain > 2 lbs in one day or 5 lbs in on eweek 90 tablet 3   ??? metFORMIN (GLUCOPHAGE) 1000 MG tablet Take 1,000 mg by mouth 2 (two) times a day with meals.      ??? sacubitriL-valsartan (ENTRESTO) 49-51 mg tablet Take 1 tablet by mouth Two (2) times a day. 180 tablet 3   ??? sertraline (ZOLOFT) 50 MG tablet Take 50 mg by mouth daily.     ??? sodium zirconium cyclosilicate (LOKELMA) 5 gram PwPk packet Take 1 packet (5 g total) by mouth in the morning. 30 packet 0   ??? spironolactone (ALDACTONE) 25 MG tablet Take 1 tablet (25 mg total) by mouth in the morning. 30 tablet 3     No current facility-administered medications for this visit.       Allergies/Adverse Events:  Allergies   Allergen Reactions   ??? Iodine Other (See Comments) and Hives        Lab Results  Lab Results   Component Value Date    PRO-BNP 4,040.0 (H) 04/23/2020    PRO-BNP 4,710.0 (H) 04/10/2020    Creatinine 1.35 (H) 01/30/2021    Creatinine 1.33 (H) 11/18/2020    Creatinine 1.33 (H) 01/19/2012    Creatinine 1.19 01/18/2012    BUN 32 (H) 01/30/2021    BUN 25 (H) 11/18/2020    BUN 33 (H) 01/19/2012    BUN 29 (H) 01/18/2012    Potassium 4.5 01/30/2021    Potassium 5.0 (H) 11/18/2020    Potassium 4.5 01/19/2012    Potassium 4.7 01/18/2012    Magnesium 2.8 (H) 04/23/2020    Magnesium 2.5 (H) 04/16/2020    Magnesium 2.2 01/18/2012    AST 33 04/25/2020    AST 33 04/10/2020    AST 28 01/18/2012    ALT 31 04/25/2020    ALT 42 04/10/2020    ALT 29 01/18/2012    Total Bilirubin 0.9 04/25/2020    Total Bilirubin 0.5 04/10/2020    Total Bilirubin 0.5 01/18/2012    INR 1.08 04/10/2020    INR 1.0 01/18/2012    WBC 5.4 08/04/2020    WBC 10.6 01/19/2012    HGB 14.6 11/18/2020    HGB 12.6 (L) 01/19/2012    HCT 43.4 11/18/2020    HCT 36.8 (L) 01/19/2012    Platelet 169 08/04/2020    Platelet 141 (L) 01/19/2012    Cholesterol, Total 189 05/17/2012    Triglycerides 215 (H) 05/17/2012    HDL 34 (L) 05/17/2012    Non HDL Chol.  155 05/17/2012    LDL Cholesterol, Calculated 112 05/17/2012       Vital Signs:  There were no vitals filed for this visit.    BP Readings from Last 3 Encounters:   04/03/21 109/57   12/19/20 121/66   12/17/20 160/84  Pulse Readings from Last 3 Encounters:   04/03/21 64   12/19/20 60   12/17/20 60     Wt Readings from Last 3 Encounters:   04/03/21 92.3 kg (203 lb 6.4 oz)   12/19/20 95.5 kg (210 lb 9.6 oz)   12/17/20 95.3 kg (210 lb)      BMI Readings from Last 3 Encounters:   04/03/21 25.42 kg/m??   12/19/20 26.32 kg/m??   12/17/20 26.96 kg/m??

## 2021-06-18 ENCOUNTER — Encounter: Admit: 2021-06-18 | Discharge: 2021-06-19 | Payer: MEDICARE

## 2021-06-22 NOTE — Unmapped (Signed)
All appointments with [facility] EP REMOTE MONITORING occur from home.  You do not come in to the office or hospital for these visits.        Your Device data report from this visit can be found in your Mychart by following the steps below.  Step #1-Select Menu then document center    Step #2-Select my documents    Step #3-Select Device checks    Please contact us with any questions.    Thank you,    [facility] EP REMOTE MONITORING [facility]  Phone. [phone]  Fax. [phone]  Email. [email or web address]

## 2021-06-22 NOTE — Unmapped (Signed)
[facility]  Ep Remote Monitoring   Tel:  [phone]  Fax: [phone]    Visit Date:  06/18/2021    Findings: Normal device function, Tested Lead measurements stable and within normal range, Adequate battery reserve-8.5 years    Arrhythmias: 0.04% AF burden EGM's do not appear to show AF.      Plan: Continue Routine Remote Monitoring     Last clinic appointment: 11/18/2020    Manufacturer of Device: [employer]       Type of Device: CRT-D / Bi-Ventricular Defibrillator  See scanned/downloaded PDF report for model numbers, serial numbers, and date(s) of implant.    Presenting Rhythm: AS/Bi-VP    ______________________________________________________________________  Percentage Biventricular Pacing: 95 %    Heart Failure Monitoring: Optivol  No Evidence of Fluid Accumulation   Hx of HF  ______________________________________________________________________      Please see downloaded PDF file of transmission under Media Tab  for full details of device interrogation to include, when applicable,  battery status/charge time, lead trend data, and programmed parameters.

## 2021-06-26 NOTE — Unmapped (Signed)
[name] 's Repatha shipment will be canceled  as a result of MAPs seeking manufacture assistance.     I have reached out to the patient  at [phone] and communicated the delay. We will call the patient back to reschedule the delivery upon resolution. We have canceled this work request.

## 2021-06-30 ENCOUNTER — Encounter: Admit: 2021-06-30 | Discharge: 2021-07-01 | Payer: MEDICARE | Attending: Pharmacist | Primary: Pharmacist

## 2021-06-30 NOTE — Unmapped (Signed)
Heart Failure Attending Attestation  I discussed the patient with the Pharmacist and agree with the assessment and plan as documented in this note.     Carin Hock, MD PhD  Assistant Professor of Medicine  Advanced Heart Failure and Transplant

## 2021-06-30 NOTE — Unmapped (Signed)
[facility]  [address]  [place], [place] [address]  PHONE: [phone]  FAX: [phone]       CLINICAL PHARMACIST PRACTITIONER NOTE   HEART FAILURE    Referring Provider: Dr. Wonda Cheng  Cardiologist: Dr. Wonda Cheng, Dr. Hessie Dibble, and Dr. Herbert Deaner  Primary Care Provider: Marguarite Arbour, MD     Assessment and Plan:     #HFrEF (LVEF 25% improved from 15-20%): with BiV ICD placement on 07/29/20. Today he reports NYHA class I symptoms, with stable BP, HR, and weights.  Most recent labs from 06/23/21 are stable and he is tolerating therapy.  Given his SBP in the 90-100s mm Hg and recent concern for dizziness will not increase medication doses today and will consider this regimen optimized for his heart failure.  We discussed recent device interrogation.  After review with Dr. Herbert Deaner it appears this may be an atrial lead problem and he is scheduled for device check in August for further evaluation of this.  ?? No medication changes today as patient is on optimized doses  ?? Continue carvedilol 25 mg BID, empagliflozin (Jardiance) 10 mg daily, furosemide 20 mg PRN and spironolactone 25 mg every other day (to avoid tablet splitting per patient preference) and lokelma 5 g once daily  ?? Please note that many of his heart failure therapies are provided through manufacturer assistance programs.  Refills or medication dose changes need to be sent to appropriate pharmacy.  See details of this within referrals tab --> Speciality Med Authorization.  And program re-enrollment will need to be pursued at the end of this calendar year to avoid lapse in therapy; this can be done in coordination with the [facility] Medication assistance team.  ?? Douglas Beltran may benefit from IV iron therapy  (Ferritin 30.8 ng/mL, TSAT 32% in December 2021; Hgb 13 g/dL in March 1308), which Dr. Wonda Cheng is amendable to. This was discussed at a previous visit but ultimately decided not to pursue given good functional status. Recommend re-evaluation of benefit at future visit if functional status worsens.  ?? Most recent ECHO: April 2022    #CAD: Initially diagnosed with multivessel disease in 2013 with subsequent PCI (rather than CABG per patient decision). NSTEMI with PCI to proximal LAD and mid RCA in May 2021. He reports continuing to tolerate his Repatha injections. He stopped clopidogrel during the second week of May and is now only on aspirin monotherapy.  While consideration could be given to change to monotherapy P2Y12 inhibitor instead, after discussion with Dr. Hessie Dibble will not make changes currently to avoid patient confusion.  Douglas Beltran reports compliance with his medications.  Notably he has missed several doses of Repatha while awaiting manufacturer assistance program application processing.  He is not approved for this with expected medication delivery this week.Marland Kitchen  ?? Key risk factors include: CAD, PCI, Hypertension, Diabetes (hemoglobin A1c 6.1%), BMI (25.4 kg/m2) and Lipids (LDL 26 mg/dL, triglycerides 57 mg/dL)   ?? Lipids and HgbA1c last checked 03/06/21 (PCP)  ?? Continue aspirin 81 mg daily, atorvastatin 80 mg daily and evolocumab (Repatha) 140 mg every 2 weeks   ?? Repeat lipid panel every 6-12 months     #Diabetes: Patient's A1C of 6.6% on 04/06/21 is at goal of <7.0%. We discussed today that patient may benefit from GLP-1 to further reduce his CV risk. Since A1C is at goal, could consider discontinuation metformin if initiating GLP-1 agonist. Would recommend having patient apply for [employer] (for [employer]) given issues with cost  of medications.  Will defer this decision until discussion with PCP at next visit.  He denies any symptoms of hypoglycemia.  ?? Managed by PCP  ?? Continue empagliflozin 10 mg daily and metformin 1000 mg BID      #Medication Adherence & Access:   ??? Reviewed the indication, dose, and frequency of each medication with patient   ??? Medications reviewed in EPIC medication station and updated today by the clinical pharmacist practitioner.  ??? Manufacturer's assistance APPROVED for: Entresto  ??? Manufacturer's assistance APPROVED for: Lokelma ([employer])  ??? Manufacturer's assistance DENIED for: Jardiance (currently pays ~$150/month)  ??? Manufactuer's assistance is APPROVED for Repatha    #FOLLOW UP:   ?? return to clinic to see cardiologist as scheduled with Dr. Wonda Cheng on 09/04/21  ?? additional pharmacist visit is not indicated at this time, please place referral if additional assistance is needed.  It has been a pleasure to take care of Douglas Beltran.       I spent a total of 15 minutes on the phone with the patient delivering clinical care and providing education/counseling.     The patient was physically located in [place] or a state in which I am permitted to provide care. The patient and/or parent/guardian understood that s/he may incur co-pays and cost sharing, and agreed to the telemedicine visit. The visit was reasonable and appropriate under the circumstances given the patient's presentation at the time.    The patient and/or parent/guardian has been advised of the potential risks and limitations of this mode of treatment (including, but not limited to, the absence of in-person examination) and has agreed to be treated using telemedicine. The patient's/patient's family's questions regarding telemedicine have been answered.     If the visit was completed in an ambulatory setting, the patient and/or parent/guardian has also been advised to contact their provider???s office for worsening conditions, and seek emergency medical treatment and/or call 911 if the patient deems either necessary.    Bethanne Ginger, PharmD, BCPS, [facility], CPP  Cardiology Clinical Pharmacist Practitioner  [facility]  [address]  [place], [place] [address]  Phone: [phone]  Fax: [phone]    History of Present Illness:   Douglas Beltran is a 80 y.o. male with a history of HFrEF, CAD, CKD, prior NSTEMI, and T2DM who presents today for follow-up of HF medication optimization at the request of Dr. Carin Hock. 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 HFrEF diagnosed in 2013 at which point his EF was 30-35%. Clinical status remained stable until 2021 at which point he started to decline and EF was noted to be decreased to 15-20% with grade 2 diastolic dysfunction. He is now s/p BiV ICD on 07/29/20.    Summary of Recent Visits and Key Medication Changes  ??? 08/04/20: BMP included SCr 1.54 mg/dL and K 4.8 mmol/L  ??? 02/11/94: Entresto 24-26 mg increased to 1 tablet in the morning and 1.5 tablets in the afternoon; furosemide decreased to 20 mg every other day  ??? 08/26/20: Carvedilol increased to 25 mg in the morning and 12.5 mg in the evening.   ?? 09/01/20: BMP (CareEverywhere) included K 4.6 mmol/L and SCr 1.3 mg/dL  ?? 09/10/20: furosemide decreased from 20 mg every other day to 20 mg only if needed. Spironolactone 12.5 mg in the evening started  ?? 09/18/20: BMP included K 5.2 mmol/L and SCr of 1.33  ?? 09/19/20: Carvedilol increased to 25 mg BID (two 12.5mg  tabs BID until this  supply runs out, has received new Rx for 25 mg abs)  ?? 09/30/20: BMP included K 5.3 mmol/L and SCr 1.43 mg/dL  ?? 10/13/20: BMP included K 5.1 mmol/L and SCr 1.40 mg/dL  ?? 10/28/20: Lokelma 5 grams three times weekly started (first dose taken)  ?? 10/31/20: Lasix decreased to 20 mg every other day; carvedilol decreased to 25 mg in the AM and 12.5 mg in the PM due to dizziness  ?? 11/18/20: BMP with K 5.0 mmol/L, SCr 1.33 mg/dL; Ferritin 30.8 ng/mL, TSAT 32%; HGB 14.6 g/dL  ?? 11/25/20: BMP with K 4.8 mmol/L, Scr 1.4 mg/dL (per CareEverywhere)  ?? 12/19/20: Sherryll Burger increased to 1.5 tab (24-26 mg strength) BID by Dr. Wonda Cheng (dose adjustment NOT made yet, waiting on new RX to be delivered)  ?? 01/14/21: Ran out of Entresto at home  ?? 01/20/21: Lokelma increased to 5 grams 4 times weekly, Resume Entresto 24/26 mg 1.5 tabs BID  ?? 01/30/21 BMP Scr 1.35 mg/dL and K- 4.5 mmol/L  ?? 1/61/09 BMP Scr 1.3 mg/dL and K 4.8 mmol/L (care everywhere)  ?? 03/23/21: Sherryll Burger increased to 49/51 mg BID; K 4.8  ?? 04/03/21: MD cardiologist FU - no medication changes  ?? 05/18/21: SCr 1.6 mg/dL, K 4.7 mmol/L  ?? 6/0/45: spironolactone increased from 12.5 mg daily to 25 mg daily, Lokelma increased to 5 g daily.  ?? 05/27/21: SCr 1.4 mg/dL, K 4.6 mmol/L  ?? 03/21/80: Reduce spironolactone to 12.5 mg daily due to dizziness  ?? 06/11/21: SCr 1.4 mg/dL, K 5.4 mmol/L  ?? 1/91/47: SCr 1.5 mg/dL, K 4.8 mmolL      Interval History  Patient today reports doing well. He has continued to exercise at a local hospital gym 3x/week (cyclycing 3.5 miles three morning and treadmill 1.5 miles)  and walks two miles at the mall 6-7 afternoons per week and . He is able to complete the entire two miles without experiencing SOB.    Dizziness with positional changes and heat which he managed by slow movements.    He reports no active symptoms of heart failure. No dyspnea, orthopnea, paroxysmal nocturnal dyspnea, or LE edema. No palpitations, chest pain. He reports a good appetite without bloating, abd distension or early satiety. He reports laying flat at night without difficulty.    Blood pressure self-monitoring  He checks his BP regularly, with readings as noted below.     BP (mm Hg)  Heart rate (bpm)  105/57  65  99/48  79  94/43  70  91/40  66  113/53  72  110/48  70  129/81  82      Weight self-monitoring  He weighs himself daily and is now down to 189.5 lbs this morning, decreased due to increased activity. He has not taken a dose of furosemide in months and denies symptoms of dehydration.    Cardiovascular History, Studies and Procedures:    Cath / PCI:  04/15/20 Left heart catheterization  1. PCI was performed of the??99%??heavily calcificed Left Main and??LAD. Rotational atherectomy was performed using a 1.75 mm burr. PCI was performed with a 4.0x38 Xience (LM-LAD).??  2. PCI was performed of the??90%??heavily calcificed??RCA. Rotational atherectomy was performed using a 1.75 mm burr. PCI was performed with a 4.0x38 and 3.5x30 with rota (post to 4.5).   3. Excellent??angiographic result with TIMI 3??flow    CV Surgery:     EP Procedures and Devices: BiV ICD on 07/29/20    Non-Invasive Evaluation(s):   Echo:  04/03/21 ECHO Summary    1. The left ventricle is normal in size with normal wall thickness.    2. The left ventricular systolic function is severely decreased, LVEF is  visually estimated at 25%.    3. There is decreased contractile function involving the mid anterolateral  and basal anterolateral segment(s).    4. There is grade I diastolic dysfunction (impaired relaxation).    5. The mitral valve leaflets are mildly thickened with normal leaflet  mobility.    6. There is mild mitral valve regurgitation.    7. The left atrium is mildly dilated in size.    8. The aortic valve is trileaflet with mildly thickened leaflets with normal  excursion.    9. The right ventricle is mildly dilated in size, with normal systolic  function.    10. The right atrium is upper normal  in size.    06/18/20 ECHO Summary    1. Limited study to assess ventricular function.    2. The left ventricle is mildly dilated in size with mildly increased wall thickness.    3. The left ventricular systolic function is severely decreased, LVEF is visually estimated at 15-20%.    4. The inferior wall, basal inferolateral wall, and mid inferolateral wall are akinetic.    5. There is grade II diastolic dysfunction (elevated filling pressure).    6. The left atrium is mildly dilated in size.    7. The aortic valve is probably trileaflet with mildly thickened leaflets with normal excursion.     8. The right ventricle is mildly dilated in size, with normal systolic Function.     04/11/20 ECHO Summary    1. The left ventricle is moderately dilated in size with mildly increased wall thickness.    2. The left ventricular systolic function is severely decreased, LVEF is visually estimated at 20-25%.    3. There is grade II diastolic dysfunction (elevated filling pressure).    4. The mitral valve leaflets are mildly thickened with normal leaflet mobility.    5. There is mild to moderate mitral valve regurgitation.     6. The left atrium is severely dilated in size.    7. The aortic valve is trileaflet with mildly thickened leaflets with normal excursion.    8. The right ventricle is mildly dilated in size, with low normal systolic function.    9. The right atrium is moderately dilated  in size.    10. IVC size and inspiratory change suggest mildly elevated right atrial pressure. (5-10 mmHg).    11. There is a trivial, anterior pericardial effusion.    Cardiac CT/MRI/Nuclear Tests:     Cardiopulmonary Stress Tests:       Past Medical & Surgical History  Past Medical History:   Diagnosis Date   ??? CHF (congestive heart failure) (CMS-HCC)    ??? Coronary artery disease    ??? Diabetes mellitus (CMS-HCC)    ??? Hypertension 07/12/2014   ??? Macrocytic anemia    ??? Mixed hyperlipidemia 07/12/2014        Past Surgical History:   Procedure Laterality Date   ??? ABDOMINAL SURGERY     ??? EYE SURGERY     ??? JOINT REPLACEMENT     ??? PR CATH PLACE/CORON ANGIO, IMG SUPER/INTERP,W LEFT HEART VENTRICULOGRAPHY N/A 04/15/2020    Procedure: CATH LEFT HEART CATHETERIZATION W INTERVENTION;  Surgeon: Rosana Hoes, MD;  Location: [facility] CATH;  Service: Cardiology   ??? PR INSJ/RPLCMT PERM DFB W/TRNSVNS  LDS 1/DUAL CHMBR N/A 08/04/2020    Procedure: Implant Biventricular ICD System;  Surgeon: Dorcas Carrow, MD;  Location: [facility] EP;  Service: Cardiology        Social & Family History  He  reports that he has quit smoking. He has never used smokeless tobacco.     Family History   Problem Relation Age of Onset   ??? Heart disease Father    ??? Diabetes Father    ??? Heart disease Brother         Medications:  Reviewed and updated. Medication list includes revisions made during today's encounter.    Current Outpatient Medications   Medication Sig Dispense Refill   ??? ACCU-CHEK GUIDE TEST STRIPS Strp TEST BLOOD SUGAR TWICE DAILY AS INSTRUCTED     ??? ACCU-CHEK SOFTCLIX LANCETS lancets USE AS DIRECTED TWICE DAILY     ??? acetaminophen (TYLENOL) 500 MG tablet Take 500 mg by mouth every six (6) hours as needed for pain.     ??? amoxicillin (AMOXIL) 500 MG capsule TAKE 4 CAPSULES BY MOUTH 1 HOUR PRIOR TO APPOINTMENT     ??? aspirin (ECOTRIN) 81 MG tablet Take 81 mg by mouth daily.      ??? atorvastatin (LIPITOR) 80 MG tablet TAKE 1 TABLET(80 MG) BY MOUTH DAILY 90 tablet 3   ??? carvediloL (COREG) 25 MG tablet Take 1 tablet (25 mg total) by mouth Two (2) times a day. 180 tablet 1   ??? empagliflozin (JARDIANCE) 10 mg tablet Take 1 tablet (10 mg total) by mouth daily. 30 tablet 11   ??? empty container (SHARPS-A-GATOR DISPOSAL SYSTEM) Misc Use as directed for sharps disposal 1 each 2   ??? evolocumab (REPATHA SURECLICK) 140 mg/mL PnIj Inject the contents of 1 pen (140 mg) under the skin every fourteen (14) days. 4 mL 11   ??? furosemide (LASIX) 20 MG tablet If needed for weight gain > 2 lbs in one day or 5 lbs in on eweek 90 tablet 3   ??? metFORMIN (GLUCOPHAGE) 1000 MG tablet Take 1,000 mg by mouth 2 (two) times a day with meals.      ??? sacubitriL-valsartan (ENTRESTO) 49-51 mg tablet Take 1 tablet by mouth Two (2) times a day. 180 tablet 3   ??? sertraline (ZOLOFT) 50 MG tablet Take 50 mg by mouth daily.     ??? sodium zirconium cyclosilicate (LOKELMA) 5 gram PwPk packet Take 1 packet (5 g total) by mouth in the morning. 30 packet 0   ??? spironolactone (ALDACTONE) 25 MG tablet Take 12.5 mg by mouth in the morning.       No current facility-administered medications for this visit.       Allergies/Adverse Events:  Allergies   Allergen Reactions   ??? Iodine Other (See Comments) and Hives        Lab Results  Lab Results   Component Value Date    PRO-BNP 4,040.0 (H) 04/23/2020    PRO-BNP 4,710.0 (H) 04/10/2020    Creatinine 1.35 (H) 01/30/2021    Creatinine 1.33 (H) 11/18/2020    Creatinine 1.33 (H) 01/19/2012    Creatinine 1.19 01/18/2012    BUN 32 (H) 01/30/2021    BUN 25 (H) 11/18/2020    BUN 33 (H) 01/19/2012    BUN 29 (H) 01/18/2012    Potassium 4.5 01/30/2021    Potassium 5.0 (H) 11/18/2020    Potassium 4.5 01/19/2012    Potassium 4.7 01/18/2012    Magnesium 2.8 (H) 04/23/2020  Magnesium 2.5 (H) 04/16/2020    Magnesium 2.2 01/18/2012    AST 33 04/25/2020    AST 33 04/10/2020    AST 28 01/18/2012    ALT 31 04/25/2020    ALT 42 04/10/2020    ALT 29 01/18/2012    Total Bilirubin 0.9 04/25/2020    Total Bilirubin 0.5 04/10/2020    Total Bilirubin 0.5 01/18/2012    INR 1.08 04/10/2020    INR 1.0 01/18/2012    WBC 5.4 08/04/2020    WBC 10.6 01/19/2012    HGB 14.6 11/18/2020    HGB 12.6 (L) 01/19/2012    HCT 43.4 11/18/2020    HCT 36.8 (L) 01/19/2012    Platelet 169 08/04/2020    Platelet 141 (L) 01/19/2012    Cholesterol, Total 189 05/17/2012    Triglycerides 215 (H) 05/17/2012    HDL 34 (L) 05/17/2012    Non HDL Chol.  155 05/17/2012    LDL Cholesterol, Calculated 112 05/17/2012       Vital Signs:  There were no vitals filed for this visit.    BP Readings from Last 3 Encounters:   04/03/21 109/57   12/19/20 121/66   12/17/20 160/84     Pulse Readings from Last 3 Encounters:   04/03/21 64   12/19/20 60   12/17/20 60     Wt Readings from Last 3 Encounters:   04/03/21 92.3 kg (203 lb 6.4 oz)   12/19/20 95.5 kg (210 lb 9.6 oz)   12/17/20 95.3 kg (210 lb)      BMI Readings from Last 3 Encounters:   04/03/21 25.42 kg/m??   12/19/20 26.32 kg/m??   12/17/20 26.96 kg/m??

## 2021-07-06 ENCOUNTER — Ambulatory Visit: Admit: 2021-07-06 | Discharge: 2021-07-06 | Payer: MEDICARE

## 2021-07-06 ENCOUNTER — Encounter: Admit: 2021-07-06 | Discharge: 2021-07-06 | Payer: MEDICARE

## 2021-07-06 DIAGNOSIS — Z4502 Encounter for adjustment and management of automatic implantable cardiac defibrillator: Principal | ICD-10-CM

## 2021-07-06 NOTE — Unmapped (Signed)
[place] Heart Failure Monitoring Device Report    Patient:  Douglas Beltran  Date:  July 06, 2021    Device:   Medtronic - [place] HF cardiologist:   Dr. Johann Capers EP cardiologist:   Dr. Sheran Luz   Other cardiologist:  as above     PCP:  JEFFREY Rodena Medin, MD      30 day Report:  There were no readings that exceeded the thresholds.    Optivol device detected intrathoracic impedance that was at baseline, suggesting optimal fluid status.      Please see full interrogation report under Media tab for this encounter.     I reviewed the device-generated report and agree with findings.      Recommendation:  Continue monthly monitoring.      [name], NP

## 2021-07-28 NOTE — Unmapped (Unsigned)
Referring Provider: Dorcas Carrow, MD  [address]  [address]  [place],  [place] [address]   Primary Provider: Marguarite Arbour, MD  [address] [facility] [place] [address]   Other Providers:  Lucienne Minks EP Follow Up Note    Reason for Visit:  Douglas Beltran is a 80 y.o. male being seen for routine visit and continued care of his BiV ICD.    Assessment & Plan:    1. Ischemic Cardiomyopathy, HFrEF  s/p Medtronic CRT-D  Device checked by device nurse  I have reviewed and agree with the findings  Normal device function  A pace - 9.5%  BiV pace - 95.3%  Events - A lead noise  Battery - 8.8 years  Underlying Rhythm - SB  Continue remotes     Noise seen on A-lead. Reproducible with isometrics in clinic.       ECG: V paced.  See official ECG report.    Follow-up:  Return for annual device check.    History of Present Illness:  Douglas Beltran is a 80 y.o. male with a past medical history of severe three-vessel CAD, type 2 diabetes, HLD, MDD is being seen in follow-up s/p BiV-ICD  ??      Interval History: Feeling well. No CP, SOB, or palpitations. Having some lightheadedness with standing, likely secondary to hypotension in the setting of medication titration.         Cardiovascular History & Procedures:    Cath / PCI:  04/15/20 Left heart catheterization  1. PCI was performed of the??99%??heavily calcificed Left Main and??LAD. Rotational atherectomy was performed using a 1.75 mm burr. PCI was performed with a 4.0x38 Xience (LM-LAD).??  2. PCI was performed of the??90%??heavily calcificed??RCA. Rotational atherectomy was performed using a 1.75 mm burr. PCI was performed with a 4.0x38 and 3.5x30 with rota (post to 4.5).   3. Excellent??angiographic result with TIMI 3??flow    ?? **    CV Surgery:  ?? **    EP Procedures and Devices:  ?? 08/04/2020 Medtronic BiV ICD implant  ?? **    Non-Invasive Evaluation(s):  Echo:  ?? 04/03/2021 TTE    1. The left ventricle is normal in size with normal wall thickness.    2. LV systolic function severely decreased, LVEF visually estimated 25%.    3. There is decreased contractile function involving the mid anterolateral  and basal anterolateral segment(s).    4. There is grade I diastolic dysfunction (impaired relaxation).    5. The mitral valve leaflets are mildly thickened with normal leaflet mobility.    6. There is mild mitral valve regurgitation.    7. The left atrium is mildly dilated in size.    8. The aortic valve is trileaflet with mildly thickened leaflets with normal excursion.    9. The right ventricle is mildly dilated in size, with normal systolic  function.    10. The right atrium is upper normal  in size.  ?? **    Cardiac CT/MRI/Nuclear Tests:  ?? **               Other Past Medical History:  See below for the complete EPIC list of past medical and surgical history.      Allergies:  Iodine    Current Medications:    Current Outpatient Medications on File Prior to Visit   Medication Sig   ??? ACCU-CHEK GUIDE  TEST STRIPS Strp TEST BLOOD SUGAR TWICE DAILY AS INSTRUCTED   ??? ACCU-CHEK SOFTCLIX LANCETS lancets USE AS DIRECTED TWICE DAILY   ??? acetaminophen (TYLENOL) 500 MG tablet Take 500 mg by mouth every six (6) hours as needed for pain.   ??? amoxicillin (AMOXIL) 500 MG capsule TAKE 4 CAPSULES BY MOUTH 1 HOUR PRIOR TO APPOINTMENT   ??? aspirin (ECOTRIN) 81 MG tablet Take 81 mg by mouth daily.    ??? atorvastatin (LIPITOR) 80 MG tablet TAKE 1 TABLET(80 MG) BY MOUTH DAILY   ??? carvediloL (COREG) 25 MG tablet Take 1 tablet (25 mg total) by mouth Two (2) times a day.   ??? empagliflozin (JARDIANCE) 10 mg tablet Take 1 tablet (10 mg total) by mouth daily.   ??? empty container (SHARPS-A-GATOR DISPOSAL SYSTEM) Misc Use as directed for sharps disposal   ??? evolocumab (REPATHA SURECLICK) 140 mg/mL PnIj Inject the contents of 1 pen (140 mg) under the skin every fourteen (14) days.   ??? furosemide (LASIX) 20 MG tablet If needed for weight gain > 2 lbs in one day or 5 lbs in on eweek ??? metFORMIN (GLUCOPHAGE) 1000 MG tablet Take 1,000 mg by mouth 2 (two) times a day with meals.    ??? sacubitriL-valsartan (ENTRESTO) 49-51 mg tablet Take 1 tablet by mouth Two (2) times a day.   ??? sertraline (ZOLOFT) 50 MG tablet Take 50 mg by mouth daily.   ??? sodium zirconium cyclosilicate (LOKELMA) 5 gram PwPk packet Take 1 packet (5 g total) by mouth in the morning.   ??? spironolactone (ALDACTONE) 25 MG tablet Take 25 mg by mouth every other day.     No current facility-administered medications on file prior to visit.       Family History:  The patient's family history includes Diabetes in his father; Heart disease in his brother and father.    Social history:  He  reports that he has quit smoking. He has never used smokeless tobacco.    Review of Systems:  As per HPI and as follows.  Rest of the review of ten systems is negative or unremarkable except as stated above.    Physical Exam:  VITAL SIGNS:   Vitals:    07/30/21 1009   BP: 130/60   Pulse: 58   SpO2: 96%      Wt Readings from Last 3 Encounters:   07/30/21 90.3 kg (199 lb)   04/03/21 92.3 kg (203 lb 6.4 oz)   12/19/20 95.5 kg (210 lb 9.6 oz)      Today's Body mass index is 24.87 kg/m??.   Height: 190.5 cm (6' 3)    GENERAL: well-appearing in no acute distress  HEENT: Normocephalic and atraumatic. Conjunctivae and sclerae clear and anicteric.    NECK: Supple.   CARDIOVASCULAR: Rate and rhythm are regular.  Normal S1, S2. There is no murmur, gallops or rubs  RESPIRATORY: Normal respiratory effort. Clear to auscultation bilaterally..  There are no wheezes.  ABDOMEN: Soft, non-tender, Abdomen nondistended.    EXTREMITIES: There is no pedal edema, bilaterally.   SKIN: No rashes, ecchymosis or petechiae.  Warm, well perfused. Well healed left sided  Pacemaker/ICD scar   NEURO/PSYCH: Alert and oriented x 3. Affect appropriate.  Nonfocal    Pertinent Laboratory Studies:   Clinical Support on 07/30/2021   Component Date Value Ref Range Status   ??? EKG Ventricular Rate 07/30/2021 54  BPM Incomplete   ??? EKG Atrial Rate 07/30/2021 54  BPM Incomplete   ???  EKG P-R Interval 07/30/2021 144  ms Incomplete   ??? EKG QRS Duration 07/30/2021 158  ms Incomplete   ??? EKG Q-T Interval 07/30/2021 488  ms Incomplete   ??? EKG QTC Calculation 07/30/2021 462  ms Incomplete   ??? EKG Calculated P Axis 07/30/2021 62  degrees Incomplete   ??? EKG Calculated R Axis 07/30/2021 -83  degrees Incomplete   ??? EKG Calculated T Axis 07/30/2021 78  degrees Incomplete   ??? QTC Fredericia 07/30/2021 470  ms Incomplete       Lab Results   Component Value Date    PRO-BNP 4,040.0 (H) 04/23/2020    PRO-BNP 4,710.0 (H) 04/10/2020    Creatinine 1.35 (H) 01/30/2021    Creatinine 1.33 (H) 11/18/2020    Creatinine 1.33 (H) 01/19/2012    Creatinine 1.19 01/18/2012    BUN 32 (H) 01/30/2021    BUN 25 (H) 11/18/2020    BUN 33 (H) 01/19/2012    BUN 29 (H) 01/18/2012    Sodium 141 01/30/2021    Sodium 143 01/19/2012    Potassium 4.5 01/30/2021    Potassium 4.5 01/19/2012    CO2 26.7 01/30/2021    CO2 25 01/19/2012    Magnesium 2.8 (H) 04/23/2020    Magnesium 2.2 01/18/2012    Total Bilirubin 0.9 04/25/2020    Total Bilirubin 0.5 01/18/2012    INR 1.08 04/10/2020    INR 1.0 01/18/2012       No results found for: DIGOXIN    Lab Results   Component Value Date    Cholesterol, Total 189 05/17/2012    Triglycerides 215 (H) 05/17/2012    HDL 34 (L) 05/17/2012    Non HDL Chol.  155 05/17/2012    LDL Cholesterol, Calculated 112 05/17/2012       Lab Results   Component Value Date    WBC 5.4 08/04/2020    WBC 10.6 01/19/2012    HGB 14.6 11/18/2020    HGB 12.6 (L) 01/19/2012    HCT 43.4 11/18/2020    HCT 36.8 (L) 01/19/2012    Platelet 169 08/04/2020    Platelet 141 (L) 01/19/2012         Past Medical History:   Diagnosis Date   ??? CHF (congestive heart failure) (CMS-HCC)    ??? Coronary artery disease    ??? Diabetes mellitus (CMS-HCC)    ??? Hypertension 07/12/2014   ??? Macrocytic anemia    ??? Mixed hyperlipidemia 07/12/2014       Past Surgical History: Procedure Laterality Date   ??? ABDOMINAL SURGERY     ??? EYE SURGERY     ??? JOINT REPLACEMENT     ??? PR CATH PLACE/CORON ANGIO, IMG SUPER/INTERP,W LEFT HEART VENTRICULOGRAPHY N/A 04/15/2020    Procedure: CATH LEFT HEART CATHETERIZATION W INTERVENTION;  Surgeon: Rosana Hoes, MD;  Location: [facility] CATH;  Service: Cardiology   ??? PR INSJ/RPLCMT PERM DFB W/TRNSVNS LDS 1/DUAL CHMBR N/A 08/04/2020    Procedure: Implant Biventricular ICD System;  Surgeon: Dorcas Carrow, MD;  Location: [facility] EP;  Service: Cardiology Biventricular ICD System;  Surgeon: Dorcas Carrow, MD;  Location: [facility] EP;  Service: Cardiology

## 2021-07-30 ENCOUNTER — Institutional Professional Consult (permissible substitution)
Admit: 2021-07-30 | Discharge: 2021-07-31 | Payer: MEDICARE | Attending: Nurse Practitioner | Primary: Nurse Practitioner

## 2021-07-30 DIAGNOSIS — Z4502 Encounter for adjustment and management of automatic implantable cardiac defibrillator: Principal | ICD-10-CM

## 2021-07-30 NOTE — Unmapped (Signed)
[facility]  Cardiac Device Clinic-  [place]  Tel: [phone]  Fax: [phone]    Cardiac Implanted Electronic Device Evaluation-In Person Outpatient Visit    Visit Date:  07/30/2021    Manufacturer of Device: Medtronic     Model:C[place]alt XT HF Quad [id number]  Type of Device: CRT-D / Bi-Ventricular Defibrillator  Indication for Implant: ICM    Battery: 8.8 years estimated longevity    Mode: DDDR  Lower rate limit: 50 bpm  Upper Tracking Limit: 130  bpm  Tachy Settings:      Presenting rhythm:  AS/Bi-VP  Underlying rhythm: SB @ 56 bpm   Dependent: No    Pacing Percentages  AP: 9.5  Bi-VP: 95.3    Impedence (ohms)  Atrial lead: 741  Right Ventricular lead: 437  Left Ventricular lead: 646     Sensing (mV)  Atrial lead: 1.6 mV  Right Ventricular lead: 4.0 mV     Capture threshold (V @ ms)  Atrial lead: 0.50 V @ 0.40 ms  Right Ventricular lead:0.50 V @ 0.40 ms  Left Ventricular lead: 0.50 V@ 0.40    Diagnostics/Episodes  ?? (121) AT/AF; appears to be noise on atrial lead     Fluid Monitoring  ??  Not Applicable       Reprogramming  ??   Programming changes made for Medtronic Advisory.  Atrial Sensitivity  also increased to attempt to minimize noise on atrial lead.   Follow-up Plan:  Continue Routine Remote Monitoring      Please see scanned and / or downloaded PDF file in this patient's chart for this encounter for full details of device interrogation and reprogramming.

## 2021-08-05 NOTE — Unmapped (Signed)
Specialty Medication(s): Repatha    Mr.Emami has been dis-enrolled from the [facility] specialty pharmacy services due to enrollment in a manufacturer assistance program that sends medicine directly to the patient.    Additional information provided to the patient: n/a    [name]  [facility] Specialty Pharmacist

## 2021-08-10 ENCOUNTER — Institutional Professional Consult (permissible substitution): Admit: 2021-08-10 | Discharge: 2021-08-11 | Payer: MEDICARE

## 2021-08-10 DIAGNOSIS — Z4502 Encounter for adjustment and management of automatic implantable cardiac defibrillator: Principal | ICD-10-CM

## 2021-09-11 ENCOUNTER — Ambulatory Visit
Admit: 2021-09-11 | Discharge: 2021-09-12 | Payer: MEDICARE | Attending: Cardiovascular Disease | Primary: Cardiovascular Disease

## 2021-09-11 DIAGNOSIS — I5022 Chronic systolic (congestive) heart failure: Principal | ICD-10-CM

## 2021-09-14 ENCOUNTER — Institutional Professional Consult (permissible substitution): Admit: 2021-09-14 | Discharge: 2021-09-15 | Payer: MEDICARE

## 2021-09-14 DIAGNOSIS — I5042 Chronic combined systolic (congestive) and diastolic (congestive) heart failure: Principal | ICD-10-CM

## 2021-09-17 ENCOUNTER — Institutional Professional Consult (permissible substitution): Admit: 2021-09-17 | Discharge: 2021-09-18 | Payer: MEDICARE

## 2021-09-28 DIAGNOSIS — I5022 Chronic systolic (congestive) heart failure: Principal | ICD-10-CM

## 2021-09-28 MED ORDER — EMPAGLIFLOZIN 10 MG TABLET
ORAL_TABLET | Freq: Every day | ORAL | 11 refills | 30 days
Start: 2021-09-28 — End: ?

## 2021-09-29 MED ORDER — EMPAGLIFLOZIN 10 MG TABLET
ORAL_TABLET | Freq: Every day | ORAL | 6 refills | 30 days | Status: CP
Start: 2021-09-29 — End: ?

## 2021-10-19 ENCOUNTER — Institutional Professional Consult (permissible substitution): Admit: 2021-10-19 | Discharge: 2021-10-20 | Payer: MEDICARE

## 2021-10-19 DIAGNOSIS — I5042 Chronic combined systolic (congestive) and diastolic (congestive) heart failure: Principal | ICD-10-CM

## 2021-11-23 ENCOUNTER — Institutional Professional Consult (permissible substitution): Admit: 2021-11-23 | Discharge: 2021-11-24 | Payer: MEDICARE

## 2021-11-23 DIAGNOSIS — I5042 Chronic combined systolic (congestive) and diastolic (congestive) heart failure: Principal | ICD-10-CM

## 2021-12-10 MED ORDER — ENTRESTO 49 MG-51 MG TABLET
ORAL_TABLET | Freq: Two times a day (BID) | ORAL | 3 refills | 90 days | Status: CP
Start: 2021-12-10 — End: ?

## 2021-12-17 ENCOUNTER — Institutional Professional Consult (permissible substitution): Admit: 2021-12-17 | Discharge: 2021-12-18 | Payer: MEDICARE

## 2021-12-29 ENCOUNTER — Institutional Professional Consult (permissible substitution): Admit: 2021-12-29 | Discharge: 2021-12-30 | Payer: MEDICARE

## 2021-12-29 DIAGNOSIS — I5042 Chronic combined systolic (congestive) and diastolic (congestive) heart failure: Principal | ICD-10-CM

## 2022-01-20 ENCOUNTER — Ambulatory Visit: Admit: 2022-01-20 | Discharge: 2022-01-20 | Payer: MEDICARE

## 2022-01-20 DIAGNOSIS — I700 Atherosclerosis of aorta: Principal | ICD-10-CM

## 2022-01-20 DIAGNOSIS — I214 Non-ST elevation (NSTEMI) myocardial infarction: Principal | ICD-10-CM

## 2022-01-20 MED ORDER — REPATHA SURECLICK 140 MG/ML SUBCUTANEOUS PEN INJECTOR
SUBCUTANEOUS | 11 refills | 56 days | Status: CP
Start: 2022-01-20 — End: ?
  Filled 2022-03-22: qty 4, 56d supply, fill #0

## 2022-01-20 NOTE — Unmapped (Signed)
DIVISION OF CARDIOLOGY   [facility] [place]        Date of Service: 01/20/22    Referring Provider: Leeann Must, MD  [id number]  [place],  [place] [address]   Primary Provider: Marguarite Arbour, MD   [address] [facility] [place] [address]       Reason for Visit:  Douglas Beltran is a 81 y.o. male with history of severe three-vessel CAD, type 2 diabetes, HLD, MDD referred by Rosana Hoes* for management of systolic and diastolic heart failure.    Assessment & Plan:    1.  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  Recently admitted to med D in May 2022 with NSTEMI found to have worsening HFrEF with EF of 15-20%, grade 2 diastolic dysfunction on echo on 06/18/2020. Echo 04/2021 showed EF 25% with grade I diastolic dysfunction. 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      - Continue entresto 49-51 mg bid.  - Continue carvedilol 25 mg bid.  - Continue Jardiance 10 mg daily.  - Continue spironolactone 25 mg every other day.  - stop Lokelma and repeat potassium in 1 week.  - Has not taken Lasix in two years.    2. Coronary artery disease: Diagnosed with multivessel disease in 2013 with subsequent PCI. NSTEMI with PCI to proximal LAD and mid RCA in May 2021. No symptoms of angina at this time.  -Continue aspirin, Plavix, atorvastatin 80 mg  - Continue evolocumab  - He is a candidate for the ZEUS clinical trials, will pre-screen today  - Will check labs today    3. HTN  - BP today is controlled at 133/86  - Home BP usually averages around 112/65    Return in about 6 months (around 07/20/2022).  With a repeat echocardiogram      History of Present Illness:  Douglas Beltran with history of severe three-vessel CAD, type 2 diabetes, HLD, MDD is being seen at the [facility] for follow-up of his coronary artery disease     The patient's cardiac history started 2013 when he presented to [facility] with shortness of breath and chest tightness was found to have extensive CAD.  At the time was referred for CABG, but patient opted for PCI with placement of stents to 80% proximal mid LAD lesion, 70% mid RCA lesion, and 80% distal RCA stenosis.  At approximately the same time he was diagnosed with ischemic cardiomyopathy, at the time EF 30-35%.  In May 2021, while walking around the mall, had significant shortness of breath.  Over the following days his shortness of breath became progressively worse in addition to the start of chest tightness and pressure which prompted him to go to the urgent care, and subsequently sent to the emergency room at [facility].  At that time he underwent left and right heart cath which showed severe three-vessel disease, and an echo done at that time showed an EF of 15-20%.  He was subsequently referred to [facility] for CABG eval versus complex PCI.     Patient had cardiac MRI done on 04/14/20 which showed a severe wall motion abnormalities, EF approximately 19%, and significant scarring of apical septal wall but otherwise had viable cardiac tissue, but was deemed to high risk for CABG.  He underwent PCI with 2 DES placed to LM/LAD and RCA.  Since his hospitalization, patient has undergone placement of a BiV ICD.  Repeat echo 2 months following PCI showed similar results to prior, EF of 15-20%, grade 2 diastolic dysfunction.    Mr. [name] presents for evaluation of heart failure management. He states that when he is seated and stands up, he feels lightheaded. This occurs a few times a week. At home, his BP usually averages around 112/65. He walks two miles a day and goes to the gym. He denies any chest pain, shortness of breath, pnd/orthoponea or edema.  No syncope or palpitations.  He has had no further hospitalizations for heart failure      Cardiovascular History & Procedures:  Cath / PCI:  PCI 04/15/20:  PCI was performed of the??99%??heavily calcificed Left Main and??LAD. Rotational atherectomy was performed using a 1.75 mm burr. PCI was performed with a 4.0x38 Xience (LM-LAD).??  ??  PCI was performed of the??90%??heavily calcificed??RCA. Rotational atherectomy was performed using a 1.75 mm burr. PCI was performed with a 4.0x38 and 3.5x30 with rota (post to 4.5).   ??  Excellent??angiographic result with TIMI 3??flow.??    CV Surgery: None    EP Procedures and Devices:  BiV ICD placed 08/04/20    Non-Invasive Evaluation(s):  Echo:  04/03/21:    1. The left ventricle is normal in size with normal wall thickness.    2. The left ventricular systolic function is severely decreased, LVEF is  visually estimated at 25%.    3. There is decreased contractile function involving the mid anterolateral  and basal anterolateral segment(s).    4. There is grade I diastolic dysfunction (impaired relaxation).    5. The mitral valve leaflets are mildly thickened with normal leaflet  mobility.    6. There is mild mitral valve regurgitation.    7. The left atrium is mildly dilated in size.    8. The aortic valve is trileaflet with mildly thickened leaflets with normal  excursion.    9. The right ventricle is mildly dilated in size, with normal systolic  function.    10. The right atrium is upper normal  in size.    06/18/20:  Summary    1. Limited study to assess ventricular function.    2. The left ventricle is mildly dilated in size with mildly increased wall  thickness.    3. The left ventricular systolic function is severely decreased, LVEF is  visually estimated at 15-20%.    4. The inferior wall, basal inferolateral wall, and mid inferolateral wall  are akinetic.    5. There is grade II diastolic dysfunction (elevated filling pressure).    6. The left atrium is mildly dilated in size.    7. The aortic valve is probably trileaflet with mildly thickened leaflets  with normal excursion.    8. The right ventricle is mildly dilated in size, with normal systolic  function.    CT/MRI/Nuclear Tests:  Cardiac MRI 04/14/20:    Left ventricle is dilated (index LVEDV is 191 mL/m2) and there are severe wall motion abnormalities with reduced ejection fraction (LVEF is 19%).  ??  There is scarring of the apical septal wall involving 50-75% thickness. Otherwise, no other areas of myocardial scarring/fibrosis can be convincingly identified.  ??  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     Past Medical History:   Diagnosis Date   ??? CHF (congestive heart failure) (CMS-HCC)    ???  Coronary artery disease    ??? Diabetes mellitus (CMS-HCC)    ??? Hypertension 07/12/2014   ??? Macrocytic anemia    ??? Mixed hyperlipidemia 07/12/2014        Past Surgical History:   Procedure Laterality Date   ??? ABDOMINAL SURGERY     ??? EYE SURGERY     ??? JOINT REPLACEMENT     ??? PR CATH PLACE/CORON ANGIO, IMG SUPER/INTERP,W LEFT HEART VENTRICULOGRAPHY N/A 04/15/2020    Procedure: CATH LEFT HEART CATHETERIZATION W INTERVENTION;  Surgeon: Rosana Hoes, MD;  Location: [facility] CATH;  Service: Cardiology   ??? PR INSJ/RPLCMT PERM DFB W/TRNSVNS LDS 1/DUAL CHMBR N/A 08/04/2020    Procedure: Implant Biventricular ICD System;  Surgeon: Dorcas Carrow, MD;  Location: [facility] EP;  Service: Cardiology         Allergies:  Iodinated contrast media and Iodine    Current Medications:  Current Outpatient Medications   Medication Sig Dispense Refill   ??? ACCU-CHEK GUIDE TEST STRIPS Strp TEST BLOOD SUGAR TWICE DAILY AS INSTRUCTED     ??? ACCU-CHEK SOFTCLIX LANCETS lancets USE AS DIRECTED TWICE DAILY     ??? acetaminophen (TYLENOL) 500 MG tablet Take 500 mg by mouth every six (6) hours as needed for pain.     ??? amoxicillin (AMOXIL) 500 MG capsule TAKE 4 CAPSULES BY MOUTH 1 HOUR PRIOR TO APPOINTMENT     ??? aspirin (ECOTRIN) 81 MG tablet Take 81 mg by mouth daily.      ??? atorvastatin (LIPITOR) 80 MG tablet TAKE 1 TABLET(80 MG) BY MOUTH DAILY 90 tablet 3   ??? carvediloL (COREG) 25 MG tablet Take 1 tablet (25 mg total) by mouth Two (2) times a day. 180 tablet 1   ??? empagliflozin (JARDIANCE) 10 mg tablet Take 1 tablet (10 mg total) by mouth daily. 30 tablet 6   ??? empty container (SHARPS-A-GATOR DISPOSAL SYSTEM) Misc Use as directed for sharps disposal 1 each 2   ??? metFORMIN (GLUCOPHAGE) 1000 MG tablet Take 1,000 mg by mouth 2 (two) times a day with meals.      ??? sacubitriL-valsartan (ENTRESTO) 49-51 mg tablet Take 1 tablet by mouth Two (2) times a day. 180 tablet 3   ??? sertraline (ZOLOFT) 50 MG tablet Take 50 mg by mouth daily.     ??? spironolactone (ALDACTONE) 25 MG tablet Take 25 mg by mouth every other day.     ??? evolocumab (REPATHA SURECLICK) 140 mg/mL PnIj Inject the contents of 1 pen (140 mg) under the skin every fourteen (14) days. 4 mL 11     No current facility-administered medications for this visit.       Family History:  There is no family history of premature coronary artery disease or sudden cardiac death.    Social history:  Former smoker, quit 31 years ago    Review of Systems:  A full review of 10 systems is unremarkable except as stated in the HPI.     Physical Exam:  VITAL SIGNS:   Vitals:    01/20/22 1010   BP: 88/45 (initial) repeat blood pressure 133/60   Pulse: 59   SpO2: (!) 77%      Wt Readings from Last 3 Encounters:   01/20/22 91.3 kg (201 lb 4.8 oz)   09/11/21 90.3 kg (199 lb)   07/30/21 90.3 kg (199 lb)      Today's Body mass index is 25.85 kg/m??.  GENERAL: Well-appearing in no acute distress  HEENT: Normocephalic and  atraumatic with anicteric sclerae    NECK: Supple, without lymphadenopathy or thyromegaly. JVP not elevated. There are no carotid bruits  CARDIOVASCULAR: RRR, S1/S2 heard, no S3, no murmurs or gallops  RESPIRATORY: Clear to auscultation without wheezes or crackles.   ABDOMEN: Soft, non-tender, non-distended with audible bowel sounds. There is no organomegaly or palpable pulsatile mass.   EXTREMITIES:  Lower extremities are warm and without edema. Distal pulses are full and symmetric.  SKIN: No rashes, ecchymosis or petechiae.  NEURO: Alert, pleasant, and appropriate. Non-focal neuro exam    Pertinent Laboratory Studies:   Lab Results   Component Value Date    PRO-BNP 4,040.0 (H) 04/23/2020    PRO-BNP 4,710.0 (H) 04/10/2020    Creatinine 1.35 (H) 01/30/2021    Creatinine 1.33 (H) 11/18/2020    Creatinine 1.33 (H) 01/19/2012    Creatinine 1.19 01/18/2012    BUN 32 (H) 01/30/2021    BUN 25 (H) 11/18/2020    BUN 33 (H) 01/19/2012    BUN 29 (H) 01/18/2012    Potassium 4.5 01/30/2021    Potassium 5.0 (H) 11/18/2020    Potassium 4.5 01/19/2012    Potassium 4.7 01/18/2012    Magnesium 2.8 (H) 04/23/2020    Magnesium 2.5 (H) 04/16/2020    Magnesium 2.2 01/18/2012    AST 33 04/25/2020    AST 33 04/10/2020    AST 28 01/18/2012    ALT 31 04/25/2020    ALT 42 04/10/2020    ALT 29 01/18/2012    Total Bilirubin 0.9 04/25/2020    Total Bilirubin 0.5 04/10/2020    Total Bilirubin 0.5 01/18/2012    INR 1.08 04/10/2020    INR 1.0 01/18/2012    WBC 5.4 08/04/2020    WBC 10.6 01/19/2012    HGB 14.6 11/18/2020    HGB 12.6 (L) 01/19/2012    HCT 43.4 11/18/2020    HCT 36.8 (L) 01/19/2012    Platelet 169 08/04/2020    Platelet 141 (L) 01/19/2012    Triglycerides 215 (H) 05/17/2012    HDL 34 (L) 05/17/2012    Non HDL Chol.  155 05/17/2012    LDL Cholesterol, Calculated 112 05/17/2012       Dictated using Dragon software, please excuse typos.    Scribe Attestation:         This document serves as a record of the services and decisions performed by Molli Hazard A. Hessie Dibble, MD on 01/20/2022. It was created on his behalf by Michaele Offer, a trained medical scribe. The creation of this document is based on the provider's statements and observations that were conveyed to the medical scribe during the patient's encounter.     (The information in this document, created by the medical scribe for me, accurately reflects the services I personally performed and the decisions made by me. I have reviewed and approved this document for accuracy.)    I personally spent 35 minutes face-to-face and non-face-to-face in the care of this patient, which includes all pre, intra, and post visit time on the date of service.      Artist Pais Hessie Dibble, MD, MPH  Associate Professor of Medicine  [place] [facility] [place] [facility]  [email or web address]   -    Office [phone]

## 2022-01-21 DIAGNOSIS — I214 Non-ST elevation (NSTEMI) myocardial infarction: Principal | ICD-10-CM

## 2022-01-21 NOTE — Unmapped (Signed)
[facility] Specialty Medication Onboarding    Specialty Medication: REPATHA SURECLICK 140 mg/mL Pnij (evolocumab)  Prior Authorization: Not Required   Financial Assistance: Yes - copay card approved as secondary   Final Copay/Day Supply: $0 / 56    Insurance Restrictions: None     Notes to Pharmacist:     The triage team has completed the benefits investigation and has determined that the patient is able to fill this medication at [facility]. Please contact the patient to complete the onboarding or follow up with the prescribing physician as needed.

## 2022-01-22 NOTE — Unmapped (Signed)
[facility] Clinical Assessment & Refill Coordination Note    Douglas Beltran, DOB: 12/24/1940  Phone: [phone] (home)     All above HIPAA information was verified with patient.     Was a Nurse, learning disability used for this call? No    Specialty Medication(s):   General Specialty: Repatha     Current Outpatient Medications   Medication Sig Dispense Refill   ??? ACCU-CHEK GUIDE TEST STRIPS Strp TEST BLOOD SUGAR TWICE DAILY AS INSTRUCTED     ??? ACCU-CHEK SOFTCLIX LANCETS lancets USE AS DIRECTED TWICE DAILY     ??? acetaminophen (TYLENOL) 500 MG tablet Take 500 mg by mouth every six (6) hours as needed for pain.     ??? amoxicillin (AMOXIL) 500 MG capsule TAKE 4 CAPSULES BY MOUTH 1 HOUR PRIOR TO APPOINTMENT     ??? aspirin (ECOTRIN) 81 MG tablet Take 81 mg by mouth daily.      ??? atorvastatin (LIPITOR) 80 MG tablet TAKE 1 TABLET(80 MG) BY MOUTH DAILY 90 tablet 3   ??? carvediloL (COREG) 25 MG tablet Take 1 tablet (25 mg total) by mouth Two (2) times a day. 180 tablet 1   ??? empagliflozin (JARDIANCE) 10 mg tablet Take 1 tablet (10 mg total) by mouth daily. 30 tablet 6   ??? empty container (SHARPS-A-GATOR DISPOSAL SYSTEM) Misc Use as directed for sharps disposal 1 each 2   ??? evolocumab (REPATHA SURECLICK) 140 mg/mL PnIj Inject the contents of 1 pen (140 mg) under the skin every fourteen (14) days. 4 mL 11   ??? metFORMIN (GLUCOPHAGE) 1000 MG tablet Take 1,000 mg by mouth 2 (two) times a day with meals.      ??? sacubitriL-valsartan (ENTRESTO) 49-51 mg tablet Take 1 tablet by mouth Two (2) times a day. 180 tablet 3   ??? sertraline (ZOLOFT) 50 MG tablet Take 50 mg by mouth daily.     ??? spironolactone (ALDACTONE) 25 MG tablet Take 25 mg by mouth every other day.       No current facility-administered medications for this visit.        Changes to medications: [name] reports no changes at this time.    Allergies   Allergen Reactions   ??? Iodinated Contrast Media Hives   ??? Iodine Other (See Comments) and Hives       Changes to allergies: No    SPECIALTY MEDICATION ADHERENCE     Repatha 140 mg/ml: ~45 days of medicine on hand     Medication Adherence    Patient reported X missed doses in the last month: 0  Specialty Medication: Repatha 140 mg/mL  Informant: patient          Specialty medication(s) dose(s) confirmed: Regimen is correct and unchanged.     Are there any concerns with adherence? No    Adherence counseling provided? Not needed    CLINICAL MANAGEMENT AND INTERVENTION      Clinical Benefit Assessment:    Do you feel the medicine is effective or helping your condition? Yes    Clinical Benefit counseling provided? Not needed    Adverse Effects Assessment:    Are you experiencing any side effects? No    Are you experiencing difficulty administering your medicine? 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 For example, brushing your teeth or getting up in the morning.  Patient declined to answer    Have you discussed this with your provider? Not needed    Acute Infection Status:    Acute infections noted within Epic:  No active infections  Patient reported infection: None    Therapy Appropriateness:    Is therapy appropriate and patient progressing towards therapeutic goals? Yes, therapy is appropriate and should be continued    DISEASE/MEDICATION-SPECIFIC INFORMATION      For patients on injectable medications: Patient currently has 3 doses left.  Next injection is scheduled for 01/27/22.    PATIENT SPECIFIC NEEDS     - Does the patient have any physical, cognitive, or cultural barriers? No    - Is the patient high risk? No    - Does the patient require a Care Management Plan? No     SOCIAL DETERMINANTS OF HEALTH     At the [facility], we have learned that life circumstances - like trouble affording food, housing, utilities, or transportation can affect the health of many of our patients.   That is why we wanted to ask: are you currently experiencing any life circumstances that are negatively impacting your health and/or quality of life? Patient declined to answer    Social Determinants of Health     Food Insecurity: Not on file   Tobacco Use: Medium Risk   ??? Smoking Tobacco Use: Former   ??? Smokeless Tobacco Use: Never   ??? Passive Exposure: Not on file   Transportation Needs: Not on file   Alcohol Use: Not on file   Housing/Utilities: Not on file   Substance Use: Not on file   Financial Resource Strain: Not on file   Physical Activity: Not on file   Health Literacy: Not on file   Stress: Not on file   Intimate Partner Violence: Not on file   Depression: Not on file   Social Connections: Not on file       Would you be willing to receive help with any of the needs that you have identified today? Not applicable       SHIPPING     Specialty Medication(s) to be Shipped:   None at this time    Other medication(s) to be shipped: No additional medications requested for fill at this time     Changes to insurance: No    Delivery Scheduled: Patient declined refill at this time due to 45 day supply of med still on hand.     The patient will receive a drug information handout for each medication shipped and additional FDA Medication Guides as required.  Verified that patient has previously received a Conservation officer, historic buildings and a Surveyor, mining.    The patient or caregiver noted above participated in the development of this care plan and knows that they can request review of or adjustments to the care plan at any time.      All of the patient's questions and concerns have been addressed.    [name]   [facility] Specialty Pharmacist

## 2022-01-25 MED ORDER — SPIRONOLACTONE 25 MG TABLET
ORAL_TABLET | ORAL | 0 refills | 180 days | Status: CP
Start: 2022-01-25 — End: ?

## 2022-01-25 NOTE — Unmapped (Cosign Needed)
Douglas Beltran was seen to discuss pre-screening for the ZEUS clinical trial, a randomized trial to test whether ziltivekimab reduces the risk of having cardiovascular events in people with cardiovascular disease, chronic kidney disease, and inflammation. He was informed that participation in the study is completely voluntary and he can withdraw his consent at any time. The informed consent was reviewed with the patient, as well as the risks and benefits of consenting to the study and medications. He was given ample time to review the consent document and ask any questions he had. All of his questions were adequately addressed and he expressed understanding of the information presented in the informed consent document. He agreed to get his EGFR and HSCRP tested to assess eligibility for the study. A copy of the signed consent was given to him . After consent was signed, labs were collected.

## 2022-02-01 ENCOUNTER — Institutional Professional Consult (permissible substitution): Admit: 2022-02-01 | Discharge: 2022-02-02 | Payer: MEDICARE

## 2022-02-01 MED ORDER — ENTRESTO 49 MG-51 MG TABLET
ORAL_TABLET | Freq: Two times a day (BID) | ORAL | 3 refills | 90 days | Status: CP
Start: 2022-02-01 — End: ?

## 2022-02-01 NOTE — Unmapped (Signed)
Refill request received for patient.      Medication Requested: Sherryll Burger  Last Office Visit: 01/20/2022   Next Office Visit: 02/05/2022  Last Prescriber: Hessie Dibble    Nurse refill requirements met? Yes  If not met, why:     Sent to: Provider for signing  If sent to provider, which provider?: Cavender

## 2022-02-01 NOTE — Unmapped (Signed)
[place] Heart Failure Monitoring Device Report    Patient:  Douglas Beltran  Date:  February 01, 2022    Device:   Medtronic - [place] HF cardiologist:   Dr. Johann Capers EP cardiologist:   Dr. Sheran Luz   Other cardiologist:  as above     PCP:  JEFFREY Rodena Medin, MD      30 day Report:  There were no readings that exceeded the thresholds.    Optivol device detected intrathoracic impedance that was at baseline, suggesting optimal fluid status.        Please see full interrogation report under Media tab for this encounter.     I reviewed the device-generated report and agree with findings.      Recommendation:  Continue monthly monitoring.      [name], NP

## 2022-02-09 DIAGNOSIS — E875 Hyperkalemia: Principal | ICD-10-CM

## 2022-02-10 ENCOUNTER — Ambulatory Visit: Admit: 2022-02-10 | Discharge: 2022-02-11 | Payer: MEDICARE

## 2022-02-10 LAB — BASIC METABOLIC PANEL
ANION GAP: 6 mmol/L (ref 5–14)
BLOOD UREA NITROGEN: 27 mg/dL — ABNORMAL HIGH (ref 9–23)
BUN / CREAT RATIO: 20
CALCIUM: 10.2 mg/dL (ref 8.7–10.4)
CHLORIDE: 108 mmol/L — ABNORMAL HIGH (ref 98–107)
CO2: 26.7 mmol/L (ref 20.0–31.0)
CREATININE: 1.33 mg/dL — ABNORMAL HIGH
EGFR CKD-EPI (2021) MALE: 55 mL/min/{1.73_m2} — ABNORMAL LOW (ref >=60)
GLUCOSE RANDOM: 87 mg/dL (ref 70–179)
POTASSIUM: 4.8 mmol/L (ref 3.4–4.8)
SODIUM: 141 mmol/L (ref 135–145)

## 2022-02-16 MED ORDER — CARVEDILOL 25 MG TABLET
ORAL_TABLET | Freq: Two times a day (BID) | ORAL | 1 refills | 90 days | Status: CP
Start: 2022-02-16 — End: ?

## 2022-02-24 ENCOUNTER — Ambulatory Visit: Payer: Medicare Other | Admitting: Podiatry

## 2022-02-24 ENCOUNTER — Encounter: Payer: Self-pay | Admitting: Podiatry

## 2022-02-24 ENCOUNTER — Other Ambulatory Visit: Payer: Self-pay

## 2022-02-24 DIAGNOSIS — B351 Tinea unguium: Secondary | ICD-10-CM

## 2022-02-24 DIAGNOSIS — M79676 Pain in unspecified toe(s): Secondary | ICD-10-CM

## 2022-02-24 DIAGNOSIS — L600 Ingrowing nail: Secondary | ICD-10-CM | POA: Diagnosis not present

## 2022-02-24 MED ORDER — NEOMYCIN-POLYMYXIN-HC 1 % OT SOLN: OTIC | 1 refills | Status: AC

## 2022-02-24 NOTE — Patient Instructions (Signed)

## 2022-02-24 NOTE — Progress Notes (Signed)
?Subjective:  ?Patient ID: Daniel Luna, male    DOB: 21-Nov-1941,  MRN: [id number] ?HPI ?Chief Complaint  ?Patient presents with  ? Toe Pain  ?  2nd toe (lateral) bilateral and 4th toe (medial) left - toenail is thick, feels like its pressing into the skin, tried trimming  ? Diabetes  ?  Last a1c was 6.6  ? New Patient (Initial Visit)  ? ? ?81 y.o. male presents with the above complaint.  ? ?ROS: Denies fever chills nausea vomiting muscle aches pains calf pain back pain chest pain shortness of breath. ? ?Past Medical History:  ?Diagnosis Date  ? Arthritis   ? CHF (congestive heart failure) ([place])   ? Coronary artery disease   ? CAD s/p PCI to LAD and RCA 01/2012   ? Cyst of brain 1983  ? Diabetes mellitus without complication ([place])   ? Dysrhythmia   ? Heart failure ([place])   ? Hyperlipidemia   ? Hypertension   ? Ischemic cardiomyopathy   ? LVEF 98-33 % (systolic and diastolic, NYHA I-II symptoms)   ? Macular degeneration   ? Shortness of breath dyspnea   ? ?Past Surgical History:  ?Procedure Laterality Date  ? APPENDECTOMY  1997  ? CARDIAC CATHETERIZATION    ? COLONOSCOPY  2005  ? 4 polyps  ? crainectomy    ? crainoplasty    ? CYST EXCISION  08-06-13  ? back  ? cyst removal brain  1983  ? benign dermatoid cyst  ? EYE SURGERY Bilateral 2008, 2012  ? RIGHT/LEFT HEART CATH AND CORONARY ANGIOGRAPHY N/A 04/08/2020  ? Procedure: RIGHT/LEFT HEART CATH AND CORONARY ANGIOGRAPHY;  Surgeon: Nelva Bush, MD;  Location: [facility];  Service: Cardiovascular;  Laterality: N/A;  ? stents  2013  ? 3 placed  ? TOTAL KNEE ARTHROPLASTY Right 05/21/2015  ? Procedure: TOTAL KNEE ARTHROPLASTY;  Surgeon: Dereck Leep, MD;  Location: [facility];  Service: Orthopedics;  Laterality: Right;  ? TOTAL KNEE ARTHROPLASTY Left 09/21/2016  ? Procedure: TOTAL KNEE ARTHROPLASTY;  Surgeon: Hessie Knows, MD;  Location: [facility];  Service: Orthopedics;  Laterality: Left;  ? TOTAL KNEE REVISION Left 09/06/2017  ? Procedure: LEFT KNEE REVISION WITH  POLY EXCHANGE, OPEN SYNOVECTOMY;  Surgeon: Hessie Knows, MD;  Location: [facility];  Service: Orthopedics;  Laterality: Left;  ? ? ?Current Outpatient Medications:  ?  NEOMYCIN-POLYMYXIN-HYDROCORTISONE (CORTISPORIN) 1 % SOLN OTIC solution, Apply 1-2 drops to toe BID after soaking, Disp: 10 mL, Rfl: 1 ?  ACCU-CHEK GUIDE test strip, , Disp: , Rfl:  ?  Accu-Chek Softclix Lancets lancets, 2 (two) times daily. as directed, Disp: , Rfl:  ?  acetaminophen (TYLENOL) 325 MG tablet, Take 2 tablets (650 mg total) by mouth every 4 (four) hours as needed for headache or mild pain., Disp:  , Rfl:  ?  aspirin EC 81 MG EC tablet, Take 1 tablet (81 mg total) by mouth daily., Disp:  , Rfl:  ?  atorvastatin (LIPITOR) 80 MG tablet, Take 1 tablet (80 mg total) by mouth daily., Disp:  , Rfl:  ?  Calcium Polycarbophil (FIBER) 625 MG TABS, Take by mouth., Disp: , Rfl:  ?  carvedilol (COREG) 25 MG tablet, Take 25 mg by mouth 2 (two) times daily., Disp: , Rfl:  ?  Cysteamine Bitartrate (PROCYSBI) 300 MG PACK, Use one strip to test blood sugars twice daily, Disp: , Rfl:  ?  ENTRESTO 24-26 MG, Take 1 tablet by mouth 2 (two) times daily., Disp: ,  Rfl:  ?  furosemide (LASIX) 10 MG/ML injection, Inject 2 mLs (20 mg total) into the vein 2 (two) times daily. (Patient not taking: Reported on 04/25/2020), Disp: 4 mL, Rfl: 0 ?  insulin aspart (NOVOLOG) 100 UNIT/ML injection, Inject 0-5 Units into the skin at bedtime. (Patient not taking: Reported on 04/25/2020), Disp: 10 mL, Rfl: 11 ?  insulin aspart (NOVOLOG) 100 UNIT/ML injection, Inject 0-9 Units into the skin 3 (three) times daily with meals. (Patient not taking: Reported on 04/25/2020), Disp: 10 mL, Rfl: 11 ?  JARDIANCE 10 MG TABS tablet, Take 10 mg by mouth daily., Disp: , Rfl:  ?  losartan (COZAAR) 100 MG tablet, Take 1 tablet (100 mg total) by mouth daily. (Patient not taking: Reported on 04/25/2020), Disp:  , Rfl:  ?  metFORMIN (GLUCOPHAGE) 1000 MG tablet, Take 1,000 mg by mouth 2 (two) times  daily., Disp: , Rfl:  ?  nitroGLYCERIN (NITROSTAT) 0.4 MG SL tablet, Place 1 tablet (0.4 mg total) under the tongue every 5 (five) minutes x 3 doses as needed for chest pain. (Patient not taking: Reported on 04/25/2020), Disp:  , Rfl: 12 ?  sertraline (ZOLOFT) 50 MG tablet, Take 50 mg by mouth daily., Disp: , Rfl:  ?  spironolactone (ALDACTONE) 25 MG tablet, Take 25 mg by mouth every other day., Disp: , Rfl:  ? ?Allergies  ?Allergen Reactions  ? Iodine Hives and Other (See Comments)  ? Iodinated Contrast Media Hives  ? ?Review of Systems ?Objective:  ?There were no vitals filed for this visit. ? ?General: Well developed, nourished, in no acute distress, alert and oriented x3  ? ?Dermatological: Skin is warm, dry and supple bilateral. Nails x 10 are well maintained; remaining integument appears unremarkable at this time. There are no open sores, no preulcerative lesions, no rash or signs of infection present.  Sharp incurvated nail margin along the fibular border with nail dystrophy to the second toes.  Second toes demonstrate not only mallet toe but a bit of a hammertoe to some degree. ? ?Vascular: Dorsalis Pedis artery and Posterior Tibial artery pedal pulses are 2/4 bilateral with immedate capillary fill time. Pedal hair growth present. No varicosities and no lower extremity edema present bilateral.  ? ?Neruologic: Grossly intact via light touch bilateral. Vibratory intact via tuning fork bilateral. Protective threshold with Semmes Wienstein monofilament intact to all pedal sites bilateral. Patellar and Achilles deep tendon reflexes 2+ bilateral. No Babinski or clonus noted bilateral.  ? ?Musculoskeletal: No gross boney pedal deformities bilateral. No pain, crepitus, or limitation noted with foot and ankle range of motion bilateral. Muscular strength 5/5 in all groups tested bilateral.  Mild mallet toe hammertoe deformities lesser digits. ? ?Gait: Unassisted, Nonantalgic.  ? ? ?Radiographs: ? ?None  taken ? ?Assessment & Plan:  ? ?Assessment: Ingrown toenail fibular border second digit bilateral ? ?Plan: Chemical matricectomy's were performed today to the second digit bilateral along with fibular border.  Tolerated procedure well without complications.  He was given both oral and written home-going structure for care and soaking of the toe as well as a prescription of Cortisporin Otic to be applied twice daily after soaking.  I will follow-up with him in 2 weeks ? ? ? ? ?Zailynn Brandel T. [place], DPM ?

## 2022-03-01 NOTE — Unmapped (Signed)
03/01/22 Pt asked to call him back on 03/18/22. He currently has a pen on hand for 03/13/22. But he wants to wait to fill his other meds before Repatha puts him in the donut hole sed

## 2022-03-08 ENCOUNTER — Institutional Professional Consult (permissible substitution): Admit: 2022-03-08 | Discharge: 2022-03-09 | Payer: MEDICARE

## 2022-03-08 DIAGNOSIS — I5022 Chronic systolic (congestive) heart failure: Principal | ICD-10-CM

## 2022-03-08 DIAGNOSIS — I5042 Chronic combined systolic (congestive) and diastolic (congestive) heart failure: Principal | ICD-10-CM

## 2022-03-08 MED ORDER — EMPAGLIFLOZIN 10 MG TABLET
ORAL_TABLET | Freq: Every day | ORAL | 3 refills | 90 days | Status: CP
Start: 2022-03-08 — End: ?

## 2022-03-10 ENCOUNTER — Encounter: Payer: Self-pay | Admitting: Podiatry

## 2022-03-10 ENCOUNTER — Ambulatory Visit: Payer: Medicare Other | Admitting: Podiatry

## 2022-03-10 DIAGNOSIS — L03032 Cellulitis of left toe: Secondary | ICD-10-CM | POA: Diagnosis not present

## 2022-03-10 DIAGNOSIS — L600 Ingrowing nail: Secondary | ICD-10-CM

## 2022-03-10 DIAGNOSIS — Z9889 Other specified postprocedural states: Secondary | ICD-10-CM | POA: Diagnosis not present

## 2022-03-10 MED ORDER — DOXYCYCLINE HYCLATE 100 MG PO TABS: 100.0000 mg | ORAL_TABLET | Freq: Two times a day (BID) | ORAL | 0 refills | Status: DC

## 2022-03-10 NOTE — Unmapped (Signed)
[place] Heart Failure Monitoring Device Report    Patient:  Douglas Beltran  Date:  March 09, 2022    Device:   Medtronic - [place] HF cardiologist:   Dr. Johann Capers EP cardiologist:   [facility]   Other cardiologist:  none     PCP:  JEFFREY Rodena Medin, MD      30 day Report:  There were no readings that exceeded the thresholds.    Optivol device detected intrathoracic impedance that was at baseline, suggesting optimal fluid status.      Please see full interrogation report under Media tab for this encounter.     I reviewed the device-generated report and agree with findings.      Recommendation:    Continue monthly monitoring.      Hope Budds, NP

## 2022-03-10 NOTE — Progress Notes (Signed)
He presents today for follow-up of his nail procedure second toe bilaterally states that they still hurt but I am still soaking and covering them twice a day. ? ?Objective: Vital signs are stable he is alert and oriented x3 does demonstrate some mild erythema along the fibular borders of the second digits bilaterally there is some granulation tissue and some fibrin deposition as well.  Mild cellulitic process to the level of the DIPJ mildly tender to touch. ? 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 ? ?Plan: At this point he is going to continue to soak at least once daily continue the Corticosporin otic drops and cover during the day but leave open at bedtime.  We will also start him on oral doxycycline and I will follow-up with him in 2 weeks he will call with worsening ?

## 2022-03-18 ENCOUNTER — Institutional Professional Consult (permissible substitution): Admit: 2022-03-18 | Discharge: 2022-03-19 | Payer: MEDICARE

## 2022-03-18 DIAGNOSIS — Z4502 Encounter for adjustment and management of automatic implantable cardiac defibrillator: Principal | ICD-10-CM

## 2022-03-18 NOTE — Unmapped (Signed)
[facility] Refill Coordination Note    Specialty Medication(s) to be Shipped:   General Specialty: Repatha    Other medication(s) to be shipped: No additional medications requested for fill at this time     Douglas Beltran, DOB: 1940-12-31  Phone: [phone] (home)       All above HIPAA information was verified with patient.     Was a Nurse, learning disability used for this call? No    Completed refill call assessment today to schedule patient's medication shipment from the [facility] [phone]).  All relevant notes have been reviewed.     Specialty medication(s) and dose(s) confirmed: Regimen is correct and unchanged.   Changes to medications: [name] reports no changes at this time.  Changes to insurance: No  New side effects reported not previously addressed with a pharmacist or physician: None reported  Questions for the pharmacist: No    Confirmed patient received a Conservation officer, historic buildings and a Surveyor, mining with first shipment. The patient will receive a drug information handout for each medication shipped and additional FDA Medication Guides as required.       DISEASE/MEDICATION-SPECIFIC INFORMATION        For patients on injectable medications: Patient currently has 0 doses left.  Next injection is scheduled for 03/22/22.    SPECIALTY MEDICATION ADHERENCE     Medication Adherence    Patient reported X missed doses in the last month: 0  Specialty Medication: Repatha 140mg /ml  Patient is on additional specialty medications: No  Patient is on more than two specialty medications: No  Any gaps in refill history greater than 2 weeks in the last 3 months: no  Demonstrates understanding of importance of adherence: yes  Informant: patient  Reliability of informant: reliable  Confirmed plan for next specialty medication refill: delivery by pharmacy  Refills needed for supportive medications: not needed              Were doses missed due to medication being on hold? No    REPATHA SURECLICK 140 mg/mL : 0 days of medicine on hand        REFERRAL TO PHARMACIST     Referral to the pharmacist: Not needed      [facility]     Shipping address confirmed in Epic.     Delivery Scheduled: Yes, Expected medication delivery date: 03/22/22.     Medication will be delivered via Same Day Courier to the prescription address in Epic WAM.    Yolonda Kida   [facility] Specialty Technician

## 2022-03-18 NOTE — Unmapped (Signed)
[facility]  Ep Remote Monitoring   Tel:  [phone]  Fax: [phone]    Visit Date:  03/18/2022     Implant date: 08/04/2020    Findings: Normal device function, Tested Lead measurements stable and within normal range, Adequate battery reserve-8.3 years    Arrhythmias: 3 AF episodes longest episode 16 seconds appears to be noise on atrial lead.     Plan: Continue Routine Remote Monitoring     Last clinic appointment: 07/30/2021    Manufacturer of Device: Medtronic       Type of Device: CRT-D / Bi-Ventricular Defibrillator  See scanned/downloaded PDF report for model numbers, serial numbers, and date(s) of implant.    Presenting Rhythm: AS/Bi-VP    ______________________________________________________________________  Percentage Biventricular Pacing: 93.4 %    Heart Failure Monitoring: Optivol  No Evidence of Fluid Accumulation   Hx of HF  ______________________________________________________________________      Please see downloaded PDF file of transmission under Media Tab  for full details of device interrogation to include, when applicable,  battery status/charge time, lead trend data, and programmed parameters.

## 2022-03-19 NOTE — Unmapped (Signed)
I have reviewed the battery, threshold, lead impedance, and overall device/lead status, diagnostic data including arrhythmic events and therapies on the remote device evaluation and agree with the documentation.    Alphonsine Minium, MD

## 2022-03-24 ENCOUNTER — Ambulatory Visit: Payer: Medicare Other | Admitting: Podiatry

## 2022-03-24 ENCOUNTER — Encounter: Payer: Self-pay | Admitting: Podiatry

## 2022-03-24 DIAGNOSIS — L600 Ingrowing nail: Secondary | ICD-10-CM

## 2022-03-24 DIAGNOSIS — Z9889 Other specified postprocedural states: Secondary | ICD-10-CM

## 2022-03-24 NOTE — Progress Notes (Signed)
Efren presents today for follow-up of his mild paronychia that he had developed status post matrixectomy 3 weeks ago.  We placed him on doxycycline and stated he took all of that he is doing very well.  He states that he continues to soak on a daily basis and apply Corticosporin otic drops. ? ?Objective: Vital signs are stable alert and oriented x3.  There is mild erythema no edema cellulitis drainage or odor.  Mild tenderness on palpation. ? ?Assessment: Much improved over last visit.  Appears to be healing at this point.  Status post matrixectomy's lateral border second digits bilateral. ? ?Plan: Discussed etiology pathology conservative surgical therapies encouraged him to soak for about another week to help get rid of some of the inflammation around the proximal lateral nail fold and continue the Cortisporin otic drops.  He will continue to do so until all of the redness is gone no tenderness and no drainage covered in the daytime but leave open at that time. ?

## 2022-03-31 ENCOUNTER — Telehealth: Payer: Self-pay | Admitting: Podiatry

## 2022-03-31 NOTE — Patient Instructions (Signed)
Soak Instructions    Place 1/4 cup of epsom salts in a quart of warm tap water.  Submerge your foot or feet with outer bandage intact for the initial soak; this will allow the bandage to become moist and wet for easy lift off.  Once you remove your bandage, continue to soak in the solution for 20 minutes.  This soak should be done twice a day.  Next, remove your foot or feet from solution, blot dry the affected area and cover.  You may use a band aid large enough to cover the area or use gauze and tape.  Apply other medications to the area as directed by the doctor such as polysporin neosporin.  IF YOUR SKIN BECOMES IRRITATED WHILE USING THESE INSTRUCTIONS, IT IS OKAY TO SWITCH TO  WHITE VINEGAR AND WATER. Or you may use antibacterial soap and water to keep the toe clean  Monitor for any signs/symptoms of infection. Call the office immediately if any occur or go directly to the emergency room. Call with any questions/concerns.  

## 2022-03-31 NOTE — Telephone Encounter (Signed)
Pt needs soaking instructions for ingrown toenail removal. Could you just add it to the note from yesterday and he can pull it up in mychart. ?

## 2022-04-08 ENCOUNTER — Institutional Professional Consult (permissible substitution): Admit: 2022-04-08 | Discharge: 2022-04-09 | Payer: MEDICARE

## 2022-04-12 NOTE — Unmapped (Signed)
[place] Heart Failure Monitoring Device Report    Patient:  Douglas Beltran  Date:  Apr 12, 2022    Device:   Medtronic - [place] HF cardiologist:   Dr. Johann Capers EP cardiologist:   [facility]   Other cardiologist:  none     PCP:  JEFFREY Rodena Medin, MD      30 day Report:  There were no readings that exceeded the thresholds.    Optivol device detected intrathoracic impedance that was at baseline, suggesting optimal fluid status.      Please see full interrogation report under Media tab for this encounter.     I reviewed the device-generated report and agree with findings.      Recommendation:    Continue monthly monitoring.      Hope Budds, NP

## 2022-04-23 MED ORDER — SPIRONOLACTONE 25 MG TABLET
ORAL_TABLET | ORAL | 3 refills | 180 days | Status: CP
Start: 2022-04-23 — End: ?

## 2022-04-23 NOTE — Unmapped (Signed)
Refill request received for patient.      Medication Requested: Spironolactone  Last Office Visit: 01/20/2022   Next Office Visit: Visit date not found  Last Prescriber: Cavender    Nurse refill requirements met? Yes  If not met, why:     Sent to: Provider for signing  If sent to provider, which provider?: Cavender

## 2022-05-01 MED ORDER — ATORVASTATIN 80 MG TABLET
ORAL_TABLET | Freq: Every day | ORAL | 3 refills | 90 days
Start: 2022-05-01 — End: ?

## 2022-05-03 MED ORDER — ATORVASTATIN 80 MG TABLET
ORAL_TABLET | Freq: Every day | ORAL | 3 refills | 90 days | Status: CP
Start: 2022-05-03 — End: ?

## 2022-05-04 MED ORDER — ATORVASTATIN 80 MG TABLET
ORAL_TABLET | 3 refills | 0 days
Start: 2022-05-04 — End: ?

## 2022-05-05 NOTE — Unmapped (Signed)
[facility] Specialty Pharmacy Refill Coordination Note    Specialty Medication(s) to be Shipped:   General Specialty: Repatha    Other medication(s) to be shipped: No additional medications requested for fill at this time     Douglas Beltran, DOB: 06/22/1941  Phone: [phone] (home)       All above HIPAA information was verified with patient.     Was a Nurse, learning disability used for this call? No    Completed refill call assessment today to schedule patient's medication shipment from the [facility] [phone]).  All relevant notes have been reviewed.     Specialty medication(s) and dose(s) confirmed: Regimen is correct and unchanged.   Changes to medications: Douglas Beltran reports no changes at this time.  Changes to insurance: No  New side effects reported not previously addressed with a pharmacist or physician: None reported  Questions for the pharmacist: No    Confirmed patient received a Conservation officer, historic buildings and a Surveyor, mining with first shipment. The patient will receive a drug information handout for each medication shipped and additional FDA Medication Guides as required.       DISEASE/MEDICATION-SPECIFIC INFORMATION        For patients on injectable medications: Patient currently has 0 doses left.  Next injection is scheduled for 05/15/22.    SPECIALTY MEDICATION ADHERENCE     Medication Adherence    Patient reported X missed doses in the last month: 0  Specialty Medication: Repatha 140mg /ml  Patient is on additional specialty medications: No  Informant: patient              Were doses missed due to medication being on hold? No    REPATHA SURECLICK 140 mg/mL : 0 days of medicine on hand        REFERRAL TO PHARMACIST     Referral to the pharmacist: Not needed      [facility]     Shipping address confirmed in Epic.     Delivery Scheduled: Yes, Expected medication delivery date: 05/12/22.     Medication will be delivered via Same Day Courier to the prescription address in Epic [place].    Wyatt Mage M Elisabeth Cara   [facility] Specialty Technician

## 2022-05-11 ENCOUNTER — Institutional Professional Consult (permissible substitution): Admit: 2022-05-11 | Discharge: 2022-05-12 | Payer: MEDICARE

## 2022-05-12 MED FILL — REPATHA SURECLICK 140 MG/ML SUBCUTANEOUS PEN INJECTOR: SUBCUTANEOUS | 56 days supply | Qty: 4 | Fill #1

## 2022-05-14 NOTE — Unmapped (Signed)
[place] Heart Failure Monitoring Device Report    Patient:  Douglas Beltran  Date:  May 14, 2022    Device:   Medtronic - [place] HF cardiologist:   Dr. Johann Capers EP cardiologist:   Dr. Sheran Luz   Other cardiologist:  as above     PCP:  JEFFREY Rodena Medin, MD      30 day Report:  There were no readings that exceeded the thresholds.    Optivol device detected intrathoracic impedance that was at baseline, suggesting optimal fluid status.        Please see full interrogation report under Media tab for this encounter.     I reviewed the device-generated report and agree with findings.      Recommendation:  Continue monthly monitoring.      [name], NP

## 2022-06-16 ENCOUNTER — Institutional Professional Consult (permissible substitution): Admit: 2022-06-16 | Discharge: 2022-06-17 | Payer: MEDICARE

## 2022-06-17 ENCOUNTER — Institutional Professional Consult (permissible substitution): Admit: 2022-06-17 | Discharge: 2022-06-18 | Payer: MEDICARE

## 2022-06-17 NOTE — Unmapped (Signed)
[place] Heart Failure Monitoring Device Report    Patient:  Douglas Beltran  Date:  June 17, 2022    Device:   Medtronic - [place] HF cardiologist:   Dr. Johann Capers EP cardiologist:   Dr. Sheran Luz   Other cardiologist:  as above     PCP:  JEFFREY Rodena Medin, MD      30 day Report:  There were no readings that exceeded the thresholds.    Optivol device detected intrathoracic impedance that was at baseline, suggesting optimal fluid status.      Please see full interrogation report under Media tab for this encounter.     I reviewed the device-generated report and agree with findings.      Recommendation:  Continue monthly monitoring.      [name], NP

## 2022-06-18 NOTE — Unmapped (Signed)
[facility]  Ep Remote Monitoring   Tel:  [phone]  Fax: [phone]    Visit Date:  06/17/2022     Implant date: 08/04/2020 Herbert Deaner     Findings: Normal device function, Tested Lead measurements stable and within normal range, Adequate battery reserve-8.2 years    Arrhythmias: 1 AF episode 41 seconds.    Plan: Continue Routine Remote Monitoring     Last clinic appointment: 07/30/2021    Manufacturer of Device: Medtronic       Type of Device: CRT-D / Bi-Ventricular Defibrillator  See scanned/downloaded PDF report for model numbers, serial numbers, and date(s) of implant.    Presenting Rhythm: AS/Bi-VP    ______________________________________________________________________  Percentage Biventricular Pacing: 97 %    Heart Failure Monitoring: Optivol  No Evidence of Fluid Accumulation   Hx of HF  ______________________________________________________________________      Please see downloaded PDF file of transmission under Media Tab  for full details of device interrogation to include, when applicable,  battery status/charge time, lead trend data, and programmed parameters.

## 2022-06-21 NOTE — Unmapped (Signed)
I have reviewed and agree with the above battery, threshold, lead impedance, and overall device/lead status, diagnostic data including arrhythmic events and therapies on the device evaluation.    Angelize Ryce, MD

## 2022-07-02 NOTE — Unmapped (Signed)
[facility] Specialty Pharmacy Refill Coordination Note    Specialty Medication(s) to be Shipped:   General Specialty: Repatha    Other medication(s) to be shipped: No additional medications requested for fill at this time     [name], DOB: 27-Mar-1941  Phone: [phone] (home)       All above HIPAA information was verified with patient.     Was a Nurse, learning disability used for this call? No    Completed refill call assessment today to schedule patient's medication shipment from the [facility] [phone]).  All relevant notes have been reviewed.     Specialty medication(s) and dose(s) confirmed: Regimen is correct and unchanged.   Changes to medications: [name] reports no changes at this time.  Changes to insurance: No  New side effects reported not previously addressed with a pharmacist or physician: None reported  Questions for the pharmacist: No    Confirmed patient received a Conservation officer, historic buildings and a Surveyor, mining with first shipment. The patient will receive a drug information handout for each medication shipped and additional FDA Medication Guides as required.       DISEASE/MEDICATION-SPECIFIC INFORMATION        For patients on injectable medications: Patient currently has 0 doses left.  Next injection is scheduled for 7/28.    SPECIALTY MEDICATION ADHERENCE     Medication Adherence    Patient reported X missed doses in the last month: 0  Specialty Medication: Repatha  Patient is on additional specialty medications: No  Patient is on more than two specialty medications: No  Any gaps in refill history greater than 2 weeks in the last 3 months: no  Demonstrates understanding of importance of adherence: yes  Informant: patient              Were doses missed due to medication being on hold? No    Repatha 140mg /ml: Patient has 0 days of medication on hand    REFERRAL TO PHARMACIST     Referral to the pharmacist: Not needed      [facility]     Shipping address confirmed in Epic.     Delivery Scheduled: Yes, Expected medication delivery date: 7/26.     Medication will be delivered via Same Day Courier to the prescription address in Epic [place].    Olga Millers   [facility] Specialty Technician

## 2022-07-07 MED FILL — REPATHA SURECLICK 140 MG/ML SUBCUTANEOUS PEN INJECTOR: SUBCUTANEOUS | 56 days supply | Qty: 4 | Fill #2

## 2022-07-12 ENCOUNTER — Ambulatory Visit: Admit: 2022-07-12 | Discharge: 2022-07-12 | Payer: MEDICARE

## 2022-07-19 ENCOUNTER — Institutional Professional Consult (permissible substitution): Admit: 2022-07-19 | Discharge: 2022-07-20 | Payer: MEDICARE

## 2022-07-19 DIAGNOSIS — I5042 Chronic combined systolic (congestive) and diastolic (congestive) heart failure: Principal | ICD-10-CM

## 2022-07-19 NOTE — Unmapped (Signed)
[place] Heart Failure Monitoring Device Report    Patient:  Douglas Beltran  Date:  July 19, 2022    Device:   Medtronic - [place] HF cardiologist:   Dr. Johann Capers EP cardiologist:   [facility]   Other cardiologist:  none     PCP:  JEFFREY Rodena Medin, MD      30 day Report:  There were no readings that exceeded the thresholds.    Optivol device detected intrathoracic impedance that was at baseline, suggesting optimal fluid status.      Please see full interrogation report under Media tab for this encounter.     I reviewed the device-generated report and agree with findings.      Recommendation:    Continue monthly monitoring.      Hope Budds, NP

## 2022-07-21 NOTE — Unmapped (Signed)
[facility]  Cardiac Device Clinic-    Tel: [phone]  Fax: [phone]    Cardiac Implanted Electronic Device Evaluation-In Person Outpatient Visit    Visit Date:  08/04/2022    Manufacturer of Device: Medtronic     Model: Cobalt??? XT HF Quad CRT-D  Type of Device: CRT-D / Bi-Ventricular Defibrillator  Indication for Implant: Cardiomyopathy  Implant date:Aug/23/2021)    Battery: 7.9 years estimated longevity    Mode: DDDR  Lower rate limit: 60 bpm  Upper Tracking Limit: 130 bpm  Tachy Settings:      Presenting rhythm:  AS/Bi-VP  Underlying rhythm: SR   Dependent: No    Pacing Percentages  AP: 9.6  VP: 95.7  Effective 95.4    Impedence (ohms)  Atrial lead: 684  Right Ventricular lead: 380  Shock: 59  Left Ventricular lead: 551     Sensing (mV)  Atrial lead: 1.77mV  Right Ventricular lead: 2.64mV     Capture threshold (V @ ms)  Atrial lead: 0.5V @ 0.25ms  Right Ventricular lead: 0.5V @ 0.47ms  Left Ventricular lead: 1.0V @ 0.13ms    Diagnostics/Episodes  11 ATR's all less than 1 min, Last on 6.3/23, <0.1% burden    Fluid Monitoring   No Evidence of Fluid Accumulation       Reprogramming  None    Follow-up Plan:  Annual device, continue remote monitoring.      Please see scanned and / or downloaded PDF file in this patient's chart for this encounter for full details of device interrogation and reprogramming.

## 2022-07-28 DIAGNOSIS — I5022 Chronic systolic (congestive) heart failure: Principal | ICD-10-CM

## 2022-07-28 MED ORDER — ENTRESTO 49 MG-51 MG TABLET
ORAL_TABLET | Freq: Two times a day (BID) | ORAL | 1 refills | 90 days | Status: CP
Start: 2022-07-28 — End: ?

## 2022-07-28 MED ORDER — EMPAGLIFLOZIN 10 MG TABLET
ORAL_TABLET | Freq: Every day | ORAL | 1 refills | 90 days | Status: CP
Start: 2022-07-28 — End: ?

## 2022-07-28 NOTE — Unmapped (Signed)
Patient requesting Rx for Sherryll Burger and London Pepper be sent to [facility] 90 days due to cost savings

## 2022-08-04 ENCOUNTER — Institutional Professional Consult (permissible substitution)
Admit: 2022-08-04 | Discharge: 2022-08-05 | Payer: MEDICARE | Attending: Nurse Practitioner | Primary: Nurse Practitioner

## 2022-08-04 DIAGNOSIS — I255 Ischemic cardiomyopathy: Principal | ICD-10-CM

## 2022-08-04 DIAGNOSIS — Z4502 Encounter for adjustment and management of automatic implantable cardiac defibrillator: Principal | ICD-10-CM

## 2022-08-04 NOTE — Unmapped (Signed)
Your device function is normal today.  Schedule your next in office device check in one year. Continue remote monitoring.    [place] EP Patient Education Gallery    [email or web address]            Living With Atrial Fibrillation Patient Guide             For general questions or concerns, please call the DEVICE CLINIC during normal business hours. If you are having a medical emergency, go to the nearest emergency room or call 911.  For ICDs only  If you receive ONE shock If you receive ONE shock If you receive TWO or MORE shocks   You have no symptoms You have symptoms:    Chest pain/pressure  Shortness of breath  Rapid heart action  Dizziness  Generally don't feel well    Call your heart doctor to discuss the shock and arrange for appropriate follow-up Call 911. Follow-up with a call to your doctor. Call 911 immediately. Follow-up with a call to your heart doctor.           For Device Related Questions please contact the Device Clinic [phone] (Monday-Friday 8:00-4:30p.m)    To schedule or change an appointment with Device Clinic call: [phone]    For technical assistance with home monitors:  Please call your home monitoring company    [employer]  [phone]     Future Appointments   Date Time Provider Department Center   08/04/2022 11:30 AM Noreene Filbert [facility]   08/23/2022 12:00 AM Candiss Norse, RN [facility]   09/16/2022 12:00 AM [place] [facility]OTE [facility]ITORING [facility]

## 2022-08-04 NOTE — Unmapped (Signed)
Referring Provider: Rica Records, MD  [address]  [facility] [place],  [place] [address]   Primary Provider: Marguarite Arbour, MD  [address] [facility] [place] [address]   Other Providers:  Lucienne Minks EP Follow Up Note    Reason for Visit:  Douglas Beltran is a 81 y.o. male being seen for routine visit and continued care of his BiV ICD.    Assessment & Plan:    1. Ischemic Cardiomyopathy, HFrEF  s/p Medtronic CRT-D  Device checked by device nurse  I have reviewed and agree with the findings  Normal device function  A pace - 9.6%  BiV pace - 95.7%  Events - 11 ATR, <44min  Battery - 8 years  Underlying Rhythm - SB  Continue remotes       ECG: V paced, 58bpm. See official ECG report.    Follow-up:  Return for annual device check.    History of Present Illness:  Douglas Beltran is a 81 y.o. male with a past medical history of severe three-vessel CAD, type 2 diabetes, HLD, MDD is being seen in follow-up s/p BiV-ICD  ??      Interval History: Feeling well. No complaints or concerns. Glad to not have to shovel snow anymore .         Cardiovascular History & Procedures:    Cath / PCI:  04/15/20 Left heart catheterization  1. PCI was performed of the??99%??heavily calcificed Left Main and??LAD. Rotational atherectomy was performed using a 1.75 mm burr. PCI was performed with a 4.0x38 Xience (LM-LAD).??  2. PCI was performed of the??90%??heavily calcificed??RCA. Rotational atherectomy was performed using a 1.75 mm burr. PCI was performed with a 4.0x38 and 3.5x30 with rota (post to 4.5).   3. Excellent??angiographic result with TIMI 3??flow    ?? **    CV Surgery:  ?? **    EP Procedures and Devices:  ?? 08/04/2020 Medtronic BiV ICD implant  ?? **    Non-Invasive Evaluation(s):  Echo:  ?? 04/03/2021 TTE    1. The left ventricle is normal in size with normal wall thickness.    2. LV systolic function severely decreased, LVEF estimated at 25%.    3. There is decreased contractile function involving the mid anterolateral and basal anterolateral segment(s).    4. There is grade I diastolic dysfunction (impaired relaxation).    5. Mitral valve leaflets are mildly thickened with normal leaflet mobility    6. There is mild mitral valve regurgitation.    7. The left atrium is mildly dilated in size.    8. The aortic valve is trileaflet with mildly thickened leaflets with normal excursion.    9. Right ventricle is mildly dilated in size, with normal systolic function.    10. The right atrium is upper normal  in size.  ?? **    Cardiac CT/MRI/Nuclear Tests:  ?? **               Other Past Medical History:  See below for the complete EPIC list of past medical and surgical history.      Allergies:  Iodinated contrast media and Iodine    Current Medications:    Current Outpatient Medications on File Prior to Visit   Medication Sig   ??? ACCU-CHEK GUIDE TEST STRIPS Strp TEST BLOOD SUGAR TWICE DAILY AS INSTRUCTED   ??? ACCU-CHEK SOFTCLIX LANCETS lancets USE AS DIRECTED TWICE  DAILY   ??? acetaminophen (TYLENOL) 500 MG tablet Take 1 tablet (500 mg total) by mouth every six (6) hours as needed for pain.   ??? amoxicillin (AMOXIL) 500 MG capsule TAKE 4 CAPSULES BY MOUTH 1 HOUR PRIOR TO APPOINTMENT   ??? aspirin (ECOTRIN) 81 MG tablet Take 1 tablet (81 mg total) by mouth daily.   ??? atorvastatin (LIPITOR) 80 MG tablet Take 1 tablet (80 mg total) by mouth daily.   ??? carvediloL (COREG) 25 MG tablet Take 1 tablet (25 mg total) by mouth Two (2) times a day.   ??? empagliflozin (JARDIANCE) 10 mg tablet Take 1 tablet (10 mg total) by mouth daily.   ??? empty container (SHARPS-A-GATOR DISPOSAL SYSTEM) Misc Use as directed for sharps disposal   ??? evolocumab (REPATHA SURECLICK) 140 mg/mL PnIj Inject the contents of 1 pen (140 mg) under the skin every fourteen (14) days.   ??? metFORMIN (GLUCOPHAGE) 1000 MG tablet Take 1 tablet (1,000 mg total) by mouth in the morning and 1 tablet (1,000 mg total) in the evening. Take with meals.   ??? sacubitriL-valsartan (ENTRESTO) 49-51 mg tablet Take 1 tablet by mouth Two (2) times a day.   ??? sertraline (ZOLOFT) 50 MG tablet Take 1 tablet (50 mg total) by mouth daily.   ??? spironolactone (ALDACTONE) 25 MG tablet Take 1 tablet (25 mg total) by mouth every other day.     No current facility-administered medications on file prior to visit.       Family History:  The patient's family history includes Diabetes in his father; Heart disease in his brother and father.    Social history:  He  reports that he has quit smoking. He has never used smokeless tobacco.    Review of Systems:  As per HPI and as follows.  Rest of the review of ten systems is negative or unremarkable except as stated above.    Physical Exam:  VITAL SIGNS:   Vitals:    08/04/22 1119   BP: 94/50   Pulse: 60      Wt Readings from Last 3 Encounters:   08/04/22 89.8 kg (198 lb)   01/20/22 91.3 kg (201 lb 4.8 oz)   09/11/21 90.3 kg (199 lb)      Today's Body mass index is 25.42 kg/m??.   Height: 188 cm (6' 2)    GENERAL: well-appearing in no acute distress  HEENT: Normocephalic and atraumatic. Conjunctivae and sclerae clear and anicteric.    NECK: Supple.   CARDIOVASCULAR: Rate and rhythm are regular.  Normal S1, S2. There is no murmur, gallops or rubs  RESPIRATORY: Normal respiratory effort. Clear to auscultation bilaterally. There are no wheezes.  ABDOMEN: Soft, non-tender, Abdomen nondistended.    EXTREMITIES: There is no pedal edema, bilaterally.   SKIN: No rashes, ecchymosis or petechiae.  Warm, well perfused. Well healed left sided  Pacemaker/ICD scar   NEURO/PSYCH: Alert and oriented x 3. Affect appropriate.  Nonfocal    Pertinent Laboratory Studies:   Clinical Support on 08/04/2022   Component Date Value Ref Range Status   ??? EKG Ventricular Rate 08/04/2022 59  BPM Incomplete   ??? EKG Atrial Rate 08/04/2022 59  BPM Incomplete   ??? EKG P-R Interval 08/04/2022 148  ms Incomplete   ??? EKG QRS Duration 08/04/2022 154  ms Incomplete   ??? EKG Q-T Interval 08/04/2022 466  ms Incomplete   ??? EKG QTC Calculation 08/04/2022 461  ms Incomplete   ??? EKG Calculated P Axis  08/04/2022 82  degrees Incomplete   ??? EKG Calculated R Axis 08/04/2022 -80  degrees Incomplete   ??? EKG Calculated T Axis 08/04/2022 75  degrees Incomplete   ??? QTC Fredericia 08/04/2022 463  ms Incomplete       Lab Results   Component Value Date    PRO-BNP 4,040.0 (H) 04/23/2020    PRO-BNP 4,710.0 (H) 04/10/2020    Creatinine 1.33 (H) 02/10/2022    Creatinine 1.39 (H) 01/20/2022    Creatinine 1.33 (H) 01/19/2012    Creatinine 1.19 01/18/2012    BUN 27 (H) 02/10/2022    BUN 24 (H) 01/20/2022    BUN 33 (H) 01/19/2012    BUN 29 (H) 01/18/2012    Sodium 141 02/10/2022    Sodium 143 01/19/2012    Potassium 4.8 02/10/2022    Potassium 4.5 01/19/2012    CO2 26.7 02/10/2022    CO2 25 01/19/2012    Magnesium 2.8 (H) 04/23/2020    Magnesium 2.2 01/18/2012    Total Bilirubin 0.9 04/25/2020    Total Bilirubin 0.5 01/18/2012    INR 1.08 04/10/2020    INR 1.0 01/18/2012       No results found for: DIGOXIN    Lab Results   Component Value Date    Cholesterol 80 01/20/2022    Cholesterol, Total 189 05/17/2012    Triglycerides 87 01/20/2022    Triglycerides 215 (H) 05/17/2012    HDL 45 01/20/2022    HDL 34 (L) 05/17/2012    Non-HDL Cholesterol 35 (L) 01/20/2022    LDL Calculated 18 (L) 01/20/2022    LDL Cholesterol, Calculated 112 05/17/2012       Lab Results   Component Value Date    WBC 6.7 01/20/2022    WBC 10.6 01/19/2012    HGB 13.8 01/20/2022    HGB 12.6 (L) 01/19/2012    HCT 40.7 01/20/2022    HCT 36.8 (L) 01/19/2012    Platelet 175 01/20/2022    Platelet 141 (L) 01/19/2012         Past Medical History:   Diagnosis Date   ??? CHF (congestive heart failure) (CMS-HCC)    ??? Coronary artery disease    ??? Diabetes mellitus (CMS-HCC)    ??? Hypertension 07/12/2014   ??? Macrocytic anemia    ??? Mixed hyperlipidemia 07/12/2014       Past Surgical History:   Procedure Laterality Date   ??? ABDOMINAL SURGERY     ??? EYE SURGERY     ??? JOINT REPLACEMENT     ??? PR CATH PLACE/CORON ANGIO, IMG SUPER/INTERP,W LEFT HEART VENTRICULOGRAPHY N/A 04/15/2020    Procedure: CATH LEFT HEART CATHETERIZATION W INTERVENTION;  Surgeon: Rosana Hoes, MD;  Location: [facility] CATH;  Service: Cardiology   ??? PR INSJ/RPLCMT PERM DFB W/TRNSVNS LDS 1/DUAL CHMBR N/A 08/04/2020    Procedure: Implant Biventricular ICD System;  Surgeon: Dorcas Carrow, MD;  Location: [facility] EP;  Service: Cardiology

## 2022-08-13 NOTE — Unmapped (Signed)
I was able to speak to you re: lightheadeness when getting to an upright position quickly. I advise you to be seen at an urgent care close to your house for evaluation. No other symptoms reported. I will send a message to our scheduler to set up an appoinment with Dr. Hessie Dibble.

## 2022-08-16 NOTE — Unmapped (Signed)
DIVISION OF CARDIOLOGY   [facility] [place]        Date of Service: 01/20/22    Referring Provider: Rica Records, *   Rica Records, MD  [address]  [facility] [place],  [place] [address]   Primary Provider: Marguarite Arbour, MD   [address] [facility] [place] [address]       Reason for Visit:  Douglas Beltran is a 81 y.o. male with history of severe three-vessel CAD, type 2 diabetes, HLD, MDD referred by Rica Records, * for management of systolic and diastolic heart failure.    Assessment & Plan:    1.  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  Was admitted to med D in May 2022 with NSTEMI found to have worsening HFrEF with EF of 15-20%, grade 2 diastolic dysfunction on echo on 06/18/2020. Echo 04/2021 showed EF 25% with grade I diastolic dysfunction. 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  He has had some orthostasis that is likely related to volume status.  As such, we will discontinue his spironolactone.  - Continue entresto 49-51 mg bid.  - Continue carvedilol 25 mg bid.  - Continue Jardiance 10 mg daily.  - Will discontinue spironolactone due to low BP and syncope episodes  - Has not taken Lasix in two years.  - Most recent echo from 07/12/22 notes LVEF is visually estimated at 25% and is overall it is unchanged from prior echoes, decreased contractile function involving the mid anterolateral and basal anterolateral segment(s), and grade I diastolic dysfunction (impaired relaxation).   - will obtain labs today     2. Coronary artery disease: Diagnosed with multivessel disease in 2013 with subsequent PCI. NSTEMI with PCI to proximal LAD and mid RCA in May 2021. No symptoms of angina at this time.  -Continue aspirin, Plavix, atorvastatin 80 mg  - Continue evolocumab    3. HTN  - BP today is at 106/57  - sBP at home can run in the 90s  - Advised him when he stands to take a moment to stand still before continuing to move, and when lightheaded he should take a moment to sit.    Return in about 3 months (around 11/17/2022).  With a repeat echocardiogram      History of Present Illness:  Douglas Beltran with history of severe three-vessel CAD, type 2 diabetes, HLD, MDD is being seen at the [place] [facility] [place] for follow-up of his coronary artery disease.     The patient's cardiac history started 2013 when he presented to [facility] with shortness of breath and chest tightness was found to have extensive CAD.  At the time was referred for CABG, but patient opted for PCI with placement of stents to 80% proximal mid LAD lesion, 70% mid RCA lesion, and 80% distal RCA stenosis.  At approximately the same time he was diagnosed with ischemic cardiomyopathy, at the time EF 30-35%.  In May 2021, while walking around the mall, had significant shortness of breath.  Over the following days his shortness of breath became progressively worse in addition to the start of chest tightness and pressure which prompted him to go to the urgent care, and subsequently sent to the emergency room at [facility].  At that time he underwent left and right heart cath which  showed severe three-vessel disease, and an echo done at that time showed an EF of 15-20%.  He was subsequently referred to [facility] for CABG eval versus complex PCI.     Patient had cardiac MRI done on 04/14/20 which showed a severe wall motion abnormalities, EF approximately 19%, and significant scarring of apical septal wall but otherwise had viable cardiac tissue, but was deemed to high risk for CABG.  He underwent PCI with 2 DES placed to LM/LAD and RCA.  Since his hospitalization, patient has undergone placement of a BiV ICD.  Repeat echo 2 months following PCI showed similar results to prior, EF of 15-20%, grade 2 diastolic dysfunction.    Douglas Beltran presents today for a follow-up in clinic. His most recent echo from 07/12/22 notes LVEF is visually estimated at 25% and is overall it is unchanged from prior echoes, decreased contractile function involving the mid anterolateral and basal anterolateral segment(s), and grade I diastolic dysfunction (impaired relaxation). It was noted in a recent telephone visit on 08/12/22 that he has been feeling lightheaded when quickly getting into an upright position. Today he notes feeling SOB and lightheaded. After work last week, he felt lightheaded and as he walked out of the elevator, passed out, and fell. His BP in clinic is 106/57. His sBP will sometimes get in the 90s. He also mentions recently laying on a couch and when getting up quickly, he got lightheaded and passed out. He has been eating and drinking well. He has not seen any bleeding in his stools. He has lost some weight and goes to the gym in the morning.    Cardiovascular History & Procedures:  Cath / PCI:  PCI 04/15/20:  PCI was performed of the 99% heavily calcificed Left Main and LAD. Rotational atherectomy was performed using a 1.75 mm burr. PCI was performed with a 4.0x38 Xience (LM-LAD).      PCI was performed of the 90% heavily calcificed RCA. Rotational atherectomy was performed using a 1.75 mm burr. PCI was performed with a 4.0x38 and 3.5x30 with rota (post to 4.5).      Excellent angiographic result with TIMI 3 flow.     CV Surgery: None    EP Procedures and Devices:  BiV ICD placed 08/04/20    Non-Invasive Evaluation(s):  Echo:  07/12/22    1. The left ventricle is normal in size with normal wall thickness.    2. The left ventricular systolic function is moderately decreased, LVEF is  visually estimated at 25%. Overall it is unchanged from prior echoes.    3. There is decreased contractile function involving the mid anterolateral  and basal anterolateral segment(s).    4. There is grade I diastolic dysfunction (impaired relaxation).    5. The aortic valve is trileaflet with mildly thickened leaflets with normal  excursion.    6. The left atrium is mildly dilated in size.    7. The right ventricle is normal in size, with normal systolic function.    8. The right atrium is mildly dilated  in size.    04/03/21:    1. The left ventricle is normal in size with normal wall thickness.    2. The left ventricular systolic function is severely decreased, LVEF is  visually estimated at 25%.    3. There is decreased contractile function involving the mid anterolateral  and basal anterolateral segment(s).    4. There is grade I diastolic dysfunction (impaired relaxation).    5. The mitral valve  leaflets are mildly thickened with normal leaflet  mobility.    6. There is mild mitral valve regurgitation.    7. The left atrium is mildly dilated in size.    8. The aortic valve is trileaflet with mildly thickened leaflets with normal  excursion.    9. The right ventricle is mildly dilated in size, with normal systolic  function.    10. The right atrium is upper normal  in size.    06/18/20:  Summary    1. Limited study to assess ventricular function.    2. The left ventricle is mildly dilated in size with mildly increased wall  thickness.    3. The left ventricular systolic function is severely decreased, LVEF is  visually estimated at 15-20%.    4. The inferior wall, basal inferolateral wall, and mid inferolateral wall  are akinetic.    5. There is grade II diastolic dysfunction (elevated filling pressure).    6. The left atrium is mildly dilated in size.    7. The aortic valve is probably trileaflet with mildly thickened leaflets  with normal excursion.    8. The right ventricle is mildly dilated in size, with normal systolic  function.    CT/MRI/Nuclear Tests:  Cardiac MRI 04/14/20:    Left ventricle is dilated (index LVEDV is 191 mL/m2) and there are severe wall motion abnormalities with reduced ejection fraction (LVEF is 19%).     There is scarring of the apical septal wall involving 50-75% thickness. Otherwise, no other areas of myocardial scarring/fibrosis can be convincingly identified.     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     Past Medical History:   Diagnosis Date    CHF (congestive heart failure) (CMS-HCC)     Coronary artery disease     Diabetes mellitus (CMS-HCC)     Hypertension 07/12/2014    Macrocytic anemia     Mixed hyperlipidemia 07/12/2014        Past Surgical History:   Procedure Laterality Date    ABDOMINAL SURGERY      EYE SURGERY      JOINT REPLACEMENT      PR CATH PLACE/CORON ANGIO, IMG SUPER/INTERP,W LEFT HEART VENTRICULOGRAPHY N/A 04/15/2020    Procedure: CATH LEFT HEART CATHETERIZATION W INTERVENTION;  Surgeon: Rosana Hoes, MD;  Location[facility] C[facility];  Service: Cardiology    PR INSJ/RPLCMT PERM DFB W/TRNSVNS LDS 1/DUAL [facility] N/A 08/04/2020    Procedure: Implant Biventricular ICD System;  Surgeon: Dorcas Carrow, MD;  Location: [facility] EP;  Service: Cardiology         Allergies:  Iodinated contrast media and Iodine    Current Medications:  Current Outpatient Medications   Medication Sig Dispense Refill    ACCU-CHEK GUIDE TEST STRIPS Strp TEST BLOOD SUGAR TWICE DAILY AS INSTRUCTED      ACCU-CHEK SOFTCLIX LANCETS lancets USE AS DIRECTED TWICE DAILY      acetaminophen (TYLENOL) 500 MG tablet Take 1 tablet (500 mg total) by mouth every six (6) hours as needed for pain.      amoxicillin (AMOXIL) 500 MG capsule TAKE 4 CAPSULES BY MOUTH 1 HOUR PRIOR TO APPOINTMENT      aspirin (ECOTRIN) 81 MG tablet Take 1 tablet (81 mg total) by mouth daily.      atorvastatin (LIPITOR) 80 MG tablet Take 1 tablet (80 mg total) by mouth daily. 90 tablet 3    carvediloL (COREG) 25 MG tablet TAKE 1 TABLET(25 MG) BY  MOUTH TWICE DAILY 180 tablet 1    empagliflozin (JARDIANCE) 10 mg tablet Take 1 tablet (10 mg total) by mouth daily. 90 tablet 1    empty container (SHARPS-A-GATOR DISPOSAL SYSTEM) Misc Use as directed for sharps disposal 1 each 2    evolocumab (REPATHA SURECLICK) 140 mg/mL PnIj Inject the contents of 1 pen (140 mg) under the skin every fourteen (14) days. 4 mL 11    metFORMIN (GLUCOPHAGE) 1000 MG tablet Take 1 tablet (1,000 mg total) by mouth in the morning and 1 tablet (1,000 mg total) in the evening. Take with meals.      sacubitriL-valsartan (ENTRESTO) 49-51 mg tablet Take 1 tablet by mouth Two (2) times a day. 180 tablet 1    sertraline (ZOLOFT) 50 MG tablet Take 1 tablet (50 mg total) by mouth daily.       No current facility-administered medications for this visit.       Family History:  There is no family history of premature coronary artery disease or sudden cardiac death.    Social history:  Former smoker, quit 31 years ago    Review of Systems:  A full review of 10 systems is unremarkable except as stated in the HPI.     Physical Exam:  VITAL SIGNS:   Vitals:    01/20/22 1010   BP: 88/45 (initial) repeat blood pressure 133/60   Pulse: 59   SpO2: (!) 77%      Wt Readings from Last 3 Encounters:   08/18/22 89 kg (196 lb 3.2 oz)   08/04/22 89.8 kg (198 lb)   01/20/22 91.3 kg (201 lb 4.8 oz)      Today's Body mass index is 25.19 kg/m??.  GENERAL: Well-appearing in no acute distress  HEENT: Normocephalic and atraumatic with anicteric sclerae    NECK: Supple, without lymphadenopathy or thyromegaly. JVP not elevated. There are no carotid bruits  CARDIOVASCULAR: RRR, S1/S2 heard, no S3, no murmurs or gallops  RESPIRATORY: Clear to auscultation without wheezes or crackles.   ABDOMEN: Soft, non-tender, non-distended with audible bowel sounds. There is no organomegaly or palpable pulsatile mass.   EXTREMITIES:  Lower extremities are warm and without edema. Distal pulses are full and symmetric.  SKIN: No rashes, ecchymosis or petechiae.  NEURO: Alert, pleasant, and appropriate. Non-focal neuro exam    Pertinent Laboratory Studies:   Lab Results   Component Value Date    PRO-BNP 4,040.0 (H) 04/23/2020    PRO-BNP 4,710.0 (H) 04/10/2020    Creatinine 1.33 (H) 02/10/2022    Creatinine 1.39 (H) 01/20/2022    Creatinine 1.33 (H) 01/19/2012    Creatinine 1.19 01/18/2012    BUN 27 (H) 02/10/2022    BUN 24 (H) 01/20/2022    BUN 33 (H) 01/19/2012    BUN 29 (H) 01/18/2012    Potassium 4.8 02/10/2022    Potassium 4.9 (H) 01/20/2022    Potassium 4.5 01/19/2012    Potassium 4.7 01/18/2012    Magnesium 2.8 (H) 04/23/2020    Magnesium 2.5 (H) 04/16/2020    Magnesium 2.2 01/18/2012    AST 33 04/25/2020    AST 33 04/10/2020    AST 28 01/18/2012    ALT 31 04/25/2020    ALT 42 04/10/2020    ALT 29 01/18/2012    Total Bilirubin 0.9 04/25/2020    Total Bilirubin 0.5 04/10/2020    Total Bilirubin 0.5 01/18/2012    INR 1.08 04/10/2020    INR 1.0 01/18/2012    WBC 6.7  01/20/2022    WBC 10.6 01/19/2012    HGB 13.8 01/20/2022    HGB 12.6 (L) 01/19/2012    HCT 40.7 01/20/2022    HCT 36.8 (L) 01/19/2012    Platelet 175 01/20/2022    Platelet 141 (L) 01/19/2012    Triglycerides 87 01/20/2022    Triglycerides 215 (H) 05/17/2012    HDL 45 01/20/2022    HDL 34 (L) 05/17/2012    Non-HDL Cholesterol 35 (L) 01/20/2022    LDL Calculated 18 (L) 01/20/2022    LDL Cholesterol, Calculated 112 05/17/2012       Dictated using Dragon software, please excuse typos.    Scribe Attestation:         This document serves as a record of the services and decisions performed by Molli Hazard A. Hessie Dibble, MD on 08/18/2022. It was created on his behalf by Fayette Pho, a trained medical scribe. The creation of this document is based on the provider's statements and observations that were conveyed to the medical scribe during the patient's encounter.     (The information in this document, created by the medical scribe for me, accurately reflects the services I personally performed and the decisions made by me. I have reviewed and approved this document for accuracy.)    I personally spent 35 minutes face-to-face and non-face-to-face in the care of this patient, which includes all pre, intra, and post visit time on the date of service.      Artist Pais Hessie Dibble, MD, MPH  Associate Professor of Medicine  [place] [facility] [place] [facility]  [email or web address]   -    Office [phone]

## 2022-08-17 MED ORDER — CARVEDILOL 25 MG TABLET
ORAL_TABLET | Freq: Two times a day (BID) | ORAL | 1 refills | 90 days | Status: CP
Start: 2022-08-17 — End: ?

## 2022-08-17 NOTE — Unmapped (Signed)
Refill request received for patient.      Medication Requested: carvedilol  Last Office Visit: 01/20/2022   Next Office Visit: 08/18/2022  Last Prescriber: Shon Hale    Nurse refill requirements met? Yes  If not met, why: n/a    Sent to: Provider for signing  If sent to provider, which provider?: Shon Hale

## 2022-08-18 ENCOUNTER — Ambulatory Visit: Admit: 2022-08-18 | Discharge: 2022-08-19 | Payer: MEDICARE

## 2022-08-18 DIAGNOSIS — I509 Heart failure, unspecified: Principal | ICD-10-CM

## 2022-08-18 DIAGNOSIS — I10 Essential (primary) hypertension: Principal | ICD-10-CM

## 2022-08-18 DIAGNOSIS — I2510 Atherosclerotic heart disease of native coronary artery without angina pectoris: Principal | ICD-10-CM

## 2022-08-18 DIAGNOSIS — I5022 Chronic systolic (congestive) heart failure: Principal | ICD-10-CM

## 2022-08-18 DIAGNOSIS — I255 Ischemic cardiomyopathy: Principal | ICD-10-CM

## 2022-08-18 DIAGNOSIS — I214 Non-ST elevation (NSTEMI) myocardial infarction: Principal | ICD-10-CM

## 2022-08-18 LAB — CBC W/ AUTO DIFF
BASOPHILS ABSOLUTE COUNT: 0 10*9/L (ref 0.0–0.1)
BASOPHILS RELATIVE PERCENT: 0.5 %
EOSINOPHILS ABSOLUTE COUNT: 0.2 10*9/L (ref 0.0–0.5)
EOSINOPHILS RELATIVE PERCENT: 2 %
HEMATOCRIT: 33.3 % — ABNORMAL LOW (ref 39.0–48.0)
HEMOGLOBIN: 11.5 g/dL — ABNORMAL LOW (ref 12.9–16.5)
LYMPHOCYTES ABSOLUTE COUNT: 0.7 10*9/L — ABNORMAL LOW (ref 1.1–3.6)
LYMPHOCYTES RELATIVE PERCENT: 8.8 %
MEAN CORPUSCULAR HEMOGLOBIN CONC: 34.6 g/dL (ref 32.0–36.0)
MEAN CORPUSCULAR HEMOGLOBIN: 33.5 pg — ABNORMAL HIGH (ref 25.9–32.4)
MEAN CORPUSCULAR VOLUME: 97 fL — ABNORMAL HIGH (ref 77.6–95.7)
MEAN PLATELET VOLUME: 7.6 fL (ref 6.8–10.7)
MONOCYTES ABSOLUTE COUNT: 0.7 10*9/L (ref 0.3–0.8)
MONOCYTES RELATIVE PERCENT: 8.9 %
NEUTROPHILS ABSOLUTE COUNT: 6.6 10*9/L (ref 1.8–7.8)
NEUTROPHILS RELATIVE PERCENT: 79.8 %
NUCLEATED RED BLOOD CELLS: 0 /100{WBCs} (ref <=4)
PLATELET COUNT: 307 10*9/L (ref 150–450)
RED BLOOD CELL COUNT: 3.44 10*12/L — ABNORMAL LOW (ref 4.26–5.60)
RED CELL DISTRIBUTION WIDTH: 12.6 % (ref 12.2–15.2)
WBC ADJUSTED: 8.2 10*9/L (ref 3.6–11.2)

## 2022-08-18 LAB — COMPREHENSIVE METABOLIC PANEL
ALBUMIN: 3.3 g/dL — ABNORMAL LOW (ref 3.4–5.0)
ALKALINE PHOSPHATASE: 84 U/L (ref 46–116)
ALT (SGPT): 94 U/L — ABNORMAL HIGH (ref 10–49)
ANION GAP: 9 mmol/L (ref 5–14)
AST (SGOT): 69 U/L — ABNORMAL HIGH (ref <=34)
BILIRUBIN TOTAL: 0.4 mg/dL (ref 0.3–1.2)
BLOOD UREA NITROGEN: 32 mg/dL — ABNORMAL HIGH (ref 9–23)
BUN / CREAT RATIO: 23
CALCIUM: 9.9 mg/dL (ref 8.7–10.4)
CHLORIDE: 103 mmol/L (ref 98–107)
CO2: 26.1 mmol/L (ref 20.0–31.0)
CREATININE: 1.4 mg/dL — ABNORMAL HIGH
EGFR CKD-EPI (2021) MALE: 51 mL/min/{1.73_m2} — ABNORMAL LOW (ref >=60)
GLUCOSE RANDOM: 99 mg/dL (ref 70–179)
POTASSIUM: 5 mmol/L — ABNORMAL HIGH (ref 3.4–4.8)
PROTEIN TOTAL: 7.5 g/dL (ref 5.7–8.2)
SODIUM: 138 mmol/L (ref 135–145)

## 2022-08-18 LAB — BASIC METABOLIC PANEL
ANION GAP: 9 mmol/L (ref 5–14)
BLOOD UREA NITROGEN: 32 mg/dL — ABNORMAL HIGH (ref 9–23)
BUN / CREAT RATIO: 23
CALCIUM: 9.9 mg/dL (ref 8.7–10.4)
CHLORIDE: 103 mmol/L (ref 98–107)
CO2: 26.1 mmol/L (ref 20.0–31.0)
CREATININE: 1.4 mg/dL — ABNORMAL HIGH
EGFR CKD-EPI (2021) MALE: 51 mL/min/{1.73_m2} — ABNORMAL LOW (ref >=60)
GLUCOSE RANDOM: 99 mg/dL (ref 70–179)
POTASSIUM: 5 mmol/L — ABNORMAL HIGH (ref 3.4–4.8)
SODIUM: 138 mmol/L (ref 135–145)

## 2022-08-18 LAB — B-TYPE NATRIURETIC PEPTIDE: B-TYPE NATRIURETIC PEPTIDE: 132.38 pg/mL — ABNORMAL HIGH (ref <=100)

## 2022-08-23 ENCOUNTER — Institutional Professional Consult (permissible substitution): Admit: 2022-08-23 | Discharge: 2022-08-24 | Payer: MEDICARE

## 2022-08-23 NOTE — Unmapped (Signed)
[place] Heart Failure Monitoring Device Report    Patient:  Douglas Beltran  Date:  August 23, 2022    Device:   Medtronic - [place] HF cardiologist:   Dr. Johann Capers EP cardiologist:   Dr. Sheran Luz   Other cardiologist:  as above     PCP:  JEFFREY Rodena Medin, MD      30 day Report:  There were no readings that exceeded the thresholds.    Optivol device detected intrathoracic impedance that was at baseline, suggesting optimal fluid status.      Please see full interrogation report under Media tab for this encounter.     I reviewed the device-generated report and agree with findings.      Recommendation:  Continue monthly monitoring.      [name], NP

## 2022-08-23 NOTE — Unmapped (Signed)
[facility] Refill Coordination Note    Specialty Medication(s) to be Shipped:   General Specialty: Repatha    Other medication(s) to be shipped: No additional medications requested for fill at this time     Douglas Beltran, DOB: [birth year]  Phone: [phone] (home)       All above HIPAA information was verified with patient.     Was a Nurse, learning disability used for this call? No    Completed refill call assessment today to schedule patient's medication shipment from the [facility] [phone]).  All relevant notes have been reviewed.     Specialty medication(s) and dose(s) confirmed: Regimen is correct and unchanged.   Changes to medications: [name] reports no changes at this time.  Changes to insurance: No  New side effects reported not previously addressed with a pharmacist or physician: None reported  Questions for the pharmacist: No    Confirmed patient received a Conservation officer, historic buildings and a Surveyor, mining with first shipment. The patient will receive a drug information handout for each medication shipped and additional FDA Medication Guides as required.       DISEASE/MEDICATION-SPECIFIC INFORMATION        For patients on injectable medications: Patient currently has 0 doses left.  Next injection is scheduled for 09/03/22.    SPECIALTY MEDICATION ADHERENCE     Medication Adherence    Patient reported X missed doses in the last month: 0  Specialty Medication: REPATHA SURECLICK 140 mg/mL Pnij (evolocumab)  Patient is on additional specialty medications: No  Patient is on more than two specialty medications: No  Informant: patient  Reliability of informant: reliable  Provider-estimated medication adherence level: good  Reasons for non-adherence: no problems identified                                Were doses missed due to medication being on hold? No    REPATHA SURECLICK 140 mg/mL Pnij (evolocumab)  : 0 days of medicine on hand       REFERRAL TO PHARMACIST     Referral to the pharmacist: Not needed      [facility]     Shipping address confirmed in Epic.     Delivery Scheduled: Yes, Expected medication delivery date: 09/02/22.     Medication will be delivered via Same Day Courier to the prescription address in Epic WAM.    Lesli Issa' W Wilhemena Durie [employer] [facility] Specialty Technician

## 2022-09-02 MED FILL — REPATHA SURECLICK 140 MG/ML SUBCUTANEOUS PEN INJECTOR: SUBCUTANEOUS | 56 days supply | Qty: 4 | Fill #3

## 2022-09-16 ENCOUNTER — Institutional Professional Consult (permissible substitution): Admit: 2022-09-16 | Discharge: 2022-09-17 | Payer: MEDICARE

## 2022-09-17 NOTE — Unmapped (Signed)
I have reviewed and agree with the above battery, threshold, lead impedance, and overall device/lead status, diagnostic data including arrhythmic events and therapies on the device evaluation.    Claudell Rhody, MD

## 2022-09-17 NOTE — Unmapped (Signed)
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     Plan: Continue Routine Remote Monitoring     Last clinic appointment: 08/04/2022    Manufacturer of Device: Medtronic       Type of Device: CRT-D / Bi-Ventricular Defibrillator  See scanned/downloaded PDF report for model numbers, serial numbers, and date(s) of implant.    Presenting Rhythm: AS/Bi-VP    ______________________________________________________________________  Percentage Biventricular Pacing: 95.6%    Heart Failure Monitoring: Optivol  No Evidence of Fluid Accumulation   Hx of HF  ______________________________________________________________________      Please see downloaded PDF file of transmission under Media Tab  for full details of device interrogation to include, when applicable,  battery status/charge time, lead trend data, and programmed parameters.

## 2022-09-27 ENCOUNTER — Institutional Professional Consult (permissible substitution): Admit: 2022-09-27 | Discharge: 2022-09-28 | Payer: MEDICARE

## 2022-09-27 DIAGNOSIS — I5042 Chronic combined systolic (congestive) and diastolic (congestive) heart failure: Principal | ICD-10-CM

## 2022-09-28 NOTE — Unmapped (Signed)
[place] Heart Failure Monitoring Device Report    Patient:  Douglas Beltran  Date:  September 28, 2022    Device:   Medtronic - [place] HF cardiologist:   Dr. Johann Capers EP cardiologist:   [facility]   Other cardiologist:  none     PCP:  Rica Records, MD      30 day Report:  There were readings that exceeded the thresholds.    OptiVol device detected intrathoracic impedance that was below the threshold, suggesting increased fluid retention (OptiVol fluid index >60).    The OptiVol fluid index resolved and was within reference range at the time of this report.    Please see full interrogation report under Media tab for this encounter.     I reviewed the device-generated report and agree with findings.      Recommendation:  Continue monthly monitoring.      Hope Budds, AGNP

## 2022-10-05 DIAGNOSIS — I5022 Chronic systolic (congestive) heart failure: Principal | ICD-10-CM

## 2022-10-05 MED ORDER — EMPAGLIFLOZIN 10 MG TABLET
ORAL_TABLET | Freq: Every day | ORAL | 1 refills | 90 days
Start: 2022-10-05 — End: ?

## 2022-10-05 MED ORDER — ENTRESTO 49 MG-51 MG TABLET
ORAL_TABLET | Freq: Two times a day (BID) | ORAL | 1 refills | 90.00000 days
Start: 2022-10-05 — End: ?

## 2022-10-05 MED ORDER — JARDIANCE 10 MG TABLET
ORAL_TABLET | Freq: Every day | ORAL | 2 refills | 0 days
Start: 2022-10-05 — End: ?

## 2022-10-05 NOTE — Unmapped (Signed)
Refill request received for patient.      Medication Requested: Salomon Mast  Last Office Visit: 08/18/2022   Next Office Visit: 11/17/2022  Last Prescriber: Hessie Dibble    Nurse refill requirements met? Yes  If not met, why:     Sent to: Provider for signing  If sent to provider, which provider?: [facility]

## 2022-10-05 NOTE — Unmapped (Signed)
Refill request received for patient.      Medication Requested: Jarome Lamas  Last Office Visit: 08/18/2022   Next Office Visit: 11/17/2022  Last Prescriber: cavender    Nurse refill requirements met? No  If not met, why: possible duplicate orders    Sent to: Provider for signing  If sent to provider, which provider?: cavender

## 2022-10-06 DIAGNOSIS — I5022 Chronic systolic (congestive) heart failure: Principal | ICD-10-CM

## 2022-10-06 MED ORDER — EMPAGLIFLOZIN 10 MG TABLET
ORAL_TABLET | Freq: Every day | ORAL | 3 refills | 90 days | Status: CP
Start: 2022-10-06 — End: ?

## 2022-10-06 MED ORDER — ENTRESTO 49 MG-51 MG TABLET
ORAL_TABLET | Freq: Two times a day (BID) | ORAL | 3 refills | 90 days | Status: CP
Start: 2022-10-06 — End: ?

## 2022-10-06 NOTE — Unmapped (Signed)
New Rx sent to new pharmacy (mail order) for [place] [employer]

## 2022-10-07 MED ORDER — EMPAGLIFLOZIN 10 MG TABLET
ORAL_TABLET | Freq: Every day | ORAL | 1 refills | 90 days | Status: CP
Start: 2022-10-07 — End: ?

## 2022-10-07 MED ORDER — ENTRESTO 49 MG-51 MG TABLET
ORAL_TABLET | Freq: Two times a day (BID) | ORAL | 2 refills | 0.00000 days | Status: CP
Start: 2022-10-07 — End: ?

## 2022-10-07 MED ORDER — JARDIANCE 10 MG TABLET
ORAL_TABLET | Freq: Every day | ORAL | 2 refills | 100 days | Status: CP
Start: 2022-10-07 — End: ?

## 2022-10-19 MED ORDER — EMPTY CONTAINER
2 refills | 0 days
Start: 2022-10-19 — End: ?

## 2022-10-19 NOTE — Unmapped (Signed)
[facility] Specialty Pharmacy Refill Coordination Note    Specialty Lite Medication(s) to be Shipped:   General Specialty: Repatha    Other medication(s) to be shipped:  Sharps     Douglas Beltran, DOB: 07/14/1941  Phone: [phone] (home)       All above HIPAA information was verified with patient.     Was a Nurse, learning disability used for this call? No    Changes to medications: Douglas Beltran reports no changes at this time.  Changes to insurance: No      REFERRAL TO PHARMACIST     Referral to the pharmacist: Not needed      [facility]     Shipping address confirmed in Epic.     Delivery Scheduled: Yes, Expected medication delivery date: 10/27/22.     Medication will be delivered via Same Day Courier to the prescription address in Epic WAM.    [name], PharmD   [facility] Specialty Pharmacist

## 2022-10-27 MED FILL — EMPTY CONTAINER: 120 days supply | Qty: 1 | Fill #0

## 2022-10-27 MED FILL — REPATHA SURECLICK 140 MG/ML SUBCUTANEOUS PEN INJECTOR: SUBCUTANEOUS | 56 days supply | Qty: 4 | Fill #4

## 2022-11-01 ENCOUNTER — Institutional Professional Consult (permissible substitution): Admit: 2022-11-01 | Discharge: 2022-11-02 | Payer: MEDICARE

## 2022-11-01 NOTE — Unmapped (Signed)
[place] Heart Failure Monitoring Device Report    Patient:  Douglas Beltran  Date:  November 01, 2022    Device:   Medtronic - [place] HF cardiologist:   Dr. Johann Capers EP cardiologist:   Dr. Sheran Luz   Other cardiologist:  as above     PCP:  Rica Records, MD      30 day Report:  There were no readings that exceeded the thresholds.    Optivol device detected intrathoracic impedance that was at baseline, suggesting optimal fluid status.      Please see full interrogation report under Media tab for this encounter.     I reviewed the device-generated report and agree with findings.      Recommendation:  Continue monthly monitoring.      [name], ANP

## 2022-11-16 NOTE — Unmapped (Signed)
 DIVISION OF CARDIOLOGY   [employer] [place]        Date of Service: 01/20/22    Referring Provider: Rica Records, *   Judithann Sheen, Lemmie Evens, MD  [address]  [facility] [place],  [place] [address]   Primary Provider: Rica Records, MD   [address] [facility] [place] [address]       Reason for Visit:  Douglas Beltran is a 81 y.o. male with history of severe three-vessel CAD, type 2 diabetes, HLD, MDD referred by Rica Records, * for management of systolic and diastolic heart failure.    Assessment & Plan:    1.  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  Was admitted to med D in May 2022 with NSTEMI found to have worsening HFrEF with EF of 15-20%, grade 2 diastolic dysfunction on echo on 06/18/2020. Echo 04/2021 showed EF 25% with grade I diastolic dysfunction. 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    - Increase entresto too highest dose  - Continue carvedilol 25 mg bid.  - Continue Jardiance 10 mg daily.  - Discontinued spironolactone due to low BP and syncope episodes at last visit. May restart in the future  - Has not taken Lasix in two years.  - Most recent echo from 07/12/22 notes LVEF is visually estimated at 25% and is overall it is unchanged from prior echoes, decreased contractile function involving the mid anterolateral and basal anterolateral segment(s), and grade I diastolic dysfunction (impaired relaxation).     2. Coronary artery disease: Diagnosed with multivessel disease in 2013 with subsequent PCI. NSTEMI with PCI to proximal LAD and mid RCA in May 2021. No symptoms of angina at this time.  -Continue aspirin, Plavix, atorvastatin 80 mg  - Continue evolocumab    3. HTN  - BP today is 155/83  - sBP at home can run in the 90s  - Advised him when he stands to take a moment to stand still before continuing to move, and when lightheaded he should take a moment to sit.    No follow-ups on file.  With a repeat echocardiogram      History of Present Illness:  Douglas Beltran with history of severe three-vessel CAD, type 2 diabetes, HLD, MDD is being seen at the [facility] for follow-up of his coronary artery disease.     The patient's cardiac history started 2013 when he presented to [facility] with shortness of breath and chest tightness was found to have extensive CAD.  At the time was referred for CABG, but patient opted for PCI with placement of stents to 80% proximal mid LAD lesion, 70% mid RCA lesion, and 80% distal RCA stenosis.  At approximately the same time he was diagnosed with ischemic cardiomyopathy, at the time EF 30-35%.  In May 2021, while walking around the mall, had significant shortness of breath.  Over the following days his shortness of breath became progressively worse in addition to the start of chest tightness and pressure which prompted him to go to the urgent care, and subsequently sent to the emergency room at [facility].  At that time he underwent left and right heart cath which showed severe three-vessel disease, and an echo done at that time showed an EF of 15-20%.  He was subsequently referred to [facility]  for CABG eval versus complex PCI.     Patient had cardiac MRI done on 04/14/20 which showed a severe wall motion abnormalities, EF approximately 19%, and significant scarring of apical septal wall but otherwise had viable cardiac tissue, but was deemed to high risk for CABG.  He underwent PCI with 2 DES placed to LM/LAD and RCA.  Since his hospitalization, patient has undergone placement of a BiV ICD.  Repeat echo 2 months following PCI showed similar results to prior, EF of 15-20%, grade 2 diastolic dysfunction.    Mr. Mahler presents today for a follow-up in clinic. Overall he is doing well but notes that he is getting more short of breath. He is still walking 2 miles at the mall every day without any difficulty. He notes notices the SOB when he does something quickly. For example, he got short of breath yesterday when he was getting the Christmas decorations out of the garage. He stopped and rested. The SOB resolved and he had no further SOB while working around the house. It doesn't seem to correlate with his weight. He did have a fall recently while he was at the gym which happened after getting up quickly. He does have some orthostasis but not frequently. He denies CP, PND/orthopnea, lower extremity edema.     Cardiovascular History & Procedures:  Cath / PCI:  PCI 04/15/20:  PCI was performed of the 99% heavily calcificed Left Main and LAD. Rotational atherectomy was performed using a 1.75 mm burr. PCI was performed with a 4.0x38 Xience (LM-LAD).      PCI was performed of the 90% heavily calcificed RCA. Rotational atherectomy was performed using a 1.75 mm burr. PCI was performed with a 4.0x38 and 3.5x30 with rota (post to 4.5).      Excellent angiographic result with TIMI 3 flow.     CV Surgery: None    EP Procedures and Devices:  BiV ICD placed 08/04/20    Non-Invasive Evaluation(s):  Echo:  07/12/22    1. The left ventricle is normal in size with normal wall thickness.    2. The left ventricular systolic function is moderately decreased, LVEF is  visually estimated at 25%. Overall it is unchanged from prior echoes.    3. There is decreased contractile function involving the mid anterolateral  and basal anterolateral segment(s).    4. There is grade I diastolic dysfunction (impaired relaxation).    5. The aortic valve is trileaflet with mildly thickened leaflets with normal  excursion.    6. The left atrium is mildly dilated in size.    7. The right ventricle is normal in size, with normal systolic function.    8. The right atrium is mildly dilated  in size.    04/03/21:    1. The left ventricle is normal in size with normal wall thickness.    2. The left ventricular systolic function is severely decreased, LVEF is  visually estimated at 25%.    3. There is decreased contractile function involving the mid anterolateral  and basal anterolateral segment(s).    4. There is grade I diastolic dysfunction (impaired relaxation).    5. The mitral valve leaflets are mildly thickened with normal leaflet  mobility.    6. There is mild mitral valve regurgitation.    7. The left atrium is mildly dilated in size.    8. The aortic valve is trileaflet with mildly thickened leaflets with normal  excursion.    9. The right ventricle is  mildly dilated in size, with normal systolic  function.    10. The right atrium is upper normal  in size.    06/18/20:  Summary    1. Limited study to assess ventricular function.    2. The left ventricle is mildly dilated in size with mildly increased wall  thickness.    3. The left ventricular systolic function is severely decreased, LVEF is  visually estimated at 15-20%.    4. The inferior wall, basal inferolateral wall, and mid inferolateral wall  are akinetic.    5. There is grade II diastolic dysfunction (elevated filling pressure).    6. The left atrium is mildly dilated in size.    7. The aortic valve is probably trileaflet with mildly thickened leaflets  with normal excursion.    8. The right ventricle is mildly dilated in size, with normal systolic  function.    CT/MRI/Nuclear Tests:  Cardiac MRI 04/14/20:    Left ventricle is dilated (index LVEDV is 191 mL/m2) and there are severe wall motion abnormalities with reduced ejection fraction (LVEF is 19%).     There is scarring of the apical septal wall involving 50-75% thickness. Otherwise, no other areas of myocardial scarring/fibrosis can be convincingly identified.     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     Past Medical History:   Diagnosis Date    CHF (congestive heart failure) (CMS-HCC)     Coronary artery disease     Diabetes mellitus (CMS-HCC)     Hypertension 07/12/2014    Macrocytic anemia     Mixed hyperlipidemia 07/12/2014        Past Surgical History:   Procedure Laterality Date    ABDOMINAL SURGERY      EYE SURGERY      JOINT REPLACEMENT      PR CATH PLACE/CORON ANGIO, IMG SUPER/INTERP,W LEFT HEART VENTRICULOGRAPHY N/A 04/15/2020    Procedure: CATH LEFT HEART CATHETERIZATION W INTERVENTION;  Surgeon: Rosana Hoes, MD;  Location: [facility] CATH;  Service: Cardiology    PR INSJ/RPLCMT PERM DFB W/TRNSVNS LDS 1/DUAL [facility] N/A 08/04/2020    Procedure: Implant Biventricular ICD System;  Surgeon: Dorcas Carrow, MD;  Location: [facility] EP;  Service: Cardiology         Allergies:  Iodinated contrast media and Iodine    Current Medications:  Current Outpatient Medications   Medication Sig Dispense Refill    ACCU-CHEK GUIDE TEST STRIPS Strp TEST BLOOD SUGAR TWICE DAILY AS INSTRUCTED      ACCU-CHEK SOFTCLIX LANCETS lancets USE AS DIRECTED TWICE DAILY      acetaminophen (TYLENOL) 500 MG tablet Take 1 tablet (500 mg total) by mouth every six (6) hours as needed for pain.      amoxicillin (AMOXIL) 500 MG capsule TAKE 4 CAPSULES BY MOUTH 1 HOUR PRIOR TO APPOINTMENT      aspirin (ECOTRIN) 81 MG tablet Take 1 tablet (81 mg total) by mouth daily.      atorvastatin (LIPITOR) 80 MG tablet Take 1 tablet (80 mg total) by mouth daily. 90 tablet 3    carvediloL (COREG) 25 MG tablet TAKE 1 TABLET(25 MG) BY MOUTH TWICE DAILY 180 tablet 1    empagliflozin (JARDIANCE) 10 mg tablet Take 1 tablet (10 mg total) by mouth daily. 90 tablet 1    empagliflozin (JARDIANCE) 10 mg tablet Take 1 tablet (10 mg total) by mouth daily. 90 tablet 3    empty container (SHARPS-A-GATOR DISPOSAL SYSTEM) Misc Use  as directed for sharps disposal 1 each 2    empty container (SHARPS-A-GATOR DISPOSAL SYSTEM) Misc Use as directed for sharps disposal 1 each 2    ENTRESTO 49-51 mg tablet TAKE 1 TABLET BY MOUTH TWICE  DAILY 200 tablet 2    evolocumab (REPATHA SURECLICK) 140 mg/mL PnIj Inject the contents of 1 pen (140 mg) under the skin every fourteen (14) days. 4 mL 11    JARDIANCE 10 mg tablet TAKE 1 TABLET BY MOUTH DAILY 100 tablet 2    metFORMIN (GLUCOPHAGE) 1000 MG tablet Take 1 tablet (1,000 mg total) by mouth in the morning and 1 tablet (1,000 mg total) in the evening. Take with meals.      sacubitriL-valsartan (ENTRESTO) 49-51 mg tablet Take 1 tablet by mouth two (2) times a day. 180 tablet 1    sacubitriL-valsartan (ENTRESTO) 49-51 mg tablet Take 1 tablet by mouth two (2) times a day. 180 tablet 3    sertraline (ZOLOFT) 50 MG tablet Take 1 tablet (50 mg total) by mouth daily.       No current facility-administered medications for this visit.       Family History:  There is no family history of premature coronary artery disease or sudden cardiac death.    Social history:  Former smoker, quit 31 years ago    Review of Systems:  A full review of 10 systems is unremarkable except as stated in the HPI.     Physical Exam:  VITAL SIGNS:     Vitals:    11/17/22 1057   BP: 155/83   Pulse: 66   SpO2:         Wt Readings from Last 3 Encounters:   08/18/22 89 kg (196 lb 3.2 oz)   08/04/22 89.8 kg (198 lb)   01/20/22 91.3 kg (201 lb 4.8 oz)        GENERAL: Well-appearing in no acute distress  HEENT: Normocephalic and atraumatic with anicteric sclerae    NECK: Supple, without lymphadenopathy or thyromegaly. JVP not elevated. There are no carotid bruits  CARDIOVASCULAR: RRR, S1/S2 heard, no S3, no murmurs or gallops  RESPIRATORY: Clear to auscultation without wheezes or crackles.   ABDOMEN: Soft, non-tender, non-distended with audible bowel sounds. There is no organomegaly or palpable pulsatile mass.   EXTREMITIES:  Lower extremities are warm and without edema. Distal pulses are full and symmetric.  SKIN: No rashes, ecchymosis or petechiae.  NEURO: Alert, pleasant, and appropriate. Non-focal neuro exam    Pertinent Laboratory Studies:   Lab Results   Component Value Date    PRO-BNP 4,040.0 (H) 04/23/2020    PRO-BNP 4,710.0 (H) 04/10/2020    Creatinine 1.40 (H) 08/18/2022 Creatinine 1.40 (H) 08/18/2022    Creatinine 1.33 (H) 01/19/2012    Creatinine 1.19 01/18/2012    BUN 32 (H) 08/18/2022    BUN 32 (H) 08/18/2022    BUN 33 (H) 01/19/2012    BUN 29 (H) 01/18/2012    Potassium 5.0 (H) 08/18/2022    Potassium 5.0 (H) 08/18/2022    Potassium 4.5 01/19/2012    Potassium 4.7 01/18/2012    Magnesium 2.8 (H) 04/23/2020    Magnesium 2.5 (H) 04/16/2020    Magnesium 2.2 01/18/2012    AST 69 (H) 08/18/2022    AST 33 04/25/2020    AST 28 01/18/2012    ALT 94 (H) 08/18/2022    ALT 31 04/25/2020    ALT 29 01/18/2012    Total Bilirubin 0.4 08/18/2022  Total Bilirubin 0.9 04/25/2020    Total Bilirubin 0.5 01/18/2012    INR 1.08 04/10/2020    INR 1.0 01/18/2012    WBC 8.2 08/18/2022    WBC 10.6 01/19/2012    HGB 11.5 (L) 08/18/2022    HGB 12.6 (L) 01/19/2012    HCT 33.3 (L) 08/18/2022    HCT 36.8 (L) 01/19/2012    Platelet 307 08/18/2022    Platelet 141 (L) 01/19/2012    Triglycerides 87 01/20/2022    Triglycerides 215 (H) 05/17/2012    HDL 45 01/20/2022    HDL 34 (L) 05/17/2012    Non-HDL Cholesterol 35 (L) 01/20/2022    LDL Calculated 18 (L) 01/20/2022    LDL Cholesterol, Calculated 112 05/17/2012       Dictated using Dragon software, please excuse typos.    Scribe Attestation:         This document serves as a record of the services and decisions performed by Molli Hazard A. Hessie Dibble, MD on 11/17/2022. It was created on his behalf by Fayette Pho, a trained medical scribe. The creation of this document is based on the provider's statements and observations that were conveyed to the medical scribe during the patient's encounter.     (The information in this document, created by the medical scribe for me, accurately reflects the services I personally performed and the decisions made by me. I have reviewed and approved this document for accuracy.)    I personally spent 35 minutes face-to-face and non-face-to-face in the care of this patient, which includes all pre, intra, and post visit time on the date of service.      Artist Pais Hessie Dibble, MD, MPH  Associate Professor of Medicine  [place] [facility] [place] [facility]  [email or web address]   -    Office [phone]

## 2022-11-17 ENCOUNTER — Ambulatory Visit: Admit: 2022-11-17 | Discharge: 2022-11-18 | Payer: MEDICARE

## 2022-11-17 DIAGNOSIS — I2510 Atherosclerotic heart disease of native coronary artery without angina pectoris: Principal | ICD-10-CM

## 2022-11-17 DIAGNOSIS — E782 Mixed hyperlipidemia: Principal | ICD-10-CM

## 2022-11-17 DIAGNOSIS — I255 Ischemic cardiomyopathy: Principal | ICD-10-CM

## 2022-11-17 DIAGNOSIS — I10 Essential (primary) hypertension: Principal | ICD-10-CM

## 2022-11-17 MED ORDER — ENTRESTO 97 MG-103 MG TABLET
ORAL_TABLET | Freq: Two times a day (BID) | ORAL | 3 refills | 90 days | Status: CP
Start: 2022-11-17 — End: ?

## 2022-11-18 MED ORDER — CARVEDILOL 25 MG TABLET
ORAL_TABLET | Freq: Two times a day (BID) | ORAL | 1 refills | 90 days | Status: CP
Start: 2022-11-18 — End: ?

## 2022-12-03 ENCOUNTER — Ambulatory Visit: Admit: 2022-12-03 | Payer: MEDICARE

## 2022-12-08 ENCOUNTER — Institutional Professional Consult (permissible substitution): Admit: 2022-12-08 | Discharge: 2022-12-09 | Payer: MEDICARE

## 2022-12-14 DIAGNOSIS — I5022 Chronic systolic (congestive) heart failure: Principal | ICD-10-CM

## 2022-12-14 DIAGNOSIS — I214 Non-ST elevation (NSTEMI) myocardial infarction: Principal | ICD-10-CM

## 2022-12-14 MED ORDER — EMPAGLIFLOZIN 10 MG TABLET
ORAL_TABLET | Freq: Every day | ORAL | 3 refills | 90 days | Status: CP
Start: 2022-12-14 — End: ?

## 2022-12-14 MED ORDER — ENTRESTO 97 MG-103 MG TABLET
ORAL_TABLET | Freq: Two times a day (BID) | ORAL | 3 refills | 90 days | Status: CP
Start: 2022-12-14 — End: ?

## 2022-12-14 NOTE — Unmapped (Signed)
[facility] Refill Coordination Note    Specialty Lite Medication(s) to be Shipped:   General Specialty: Repatha    Other medication(s) to be shipped: No additional medications requested for fill at this time     Douglas Beltran, DOB: 06/15/1941  Phone: [phone] (home)       All above HIPAA information was verified with patient.     Was a Nurse, learning disability used for this call? No    Changes to medications: Douglas Beltran reports no changes at this time.  Changes to insurance: No      REFERRAL TO PHARMACIST     Referral to the pharmacist: Not needed      [facility]     Shipping address confirmed in Epic.     Delivery Scheduled: Yes, Expected medication delivery date: 12/23/22.     Medication will be delivered via Same Day Courier to the prescription address in Epic WAM.    Douglas Beltran   [facility] Specialty Pharmacist

## 2022-12-14 NOTE — Unmapped (Signed)
Douglas Beltran Rx sent to new pharmacy

## 2022-12-14 NOTE — Unmapped (Signed)
[place] Heart Failure Monitoring Device Report    Patient:  Douglas Beltran  Date:  December 14, 2022    Device:   Medtronic - [place] HF cardiologist:   Dr. Johann Capers EP cardiologist:   [facility]   Other cardiologist:  none     PCP:  Rica Records, MD      30 day Report:  There were no readings that exceeded the thresholds.    Optivol device detected intrathoracic impedance that was at baseline, suggesting optimal fluid status.      Please see full interrogation report under Media tab for this encounter.     I reviewed the device-generated report and agree with findings.      Recommendation:  Continue monthly monitoring.      Hope Budds, AGNP

## 2022-12-23 MED FILL — REPATHA SURECLICK 140 MG/ML SUBCUTANEOUS PEN INJECTOR: SUBCUTANEOUS | 56 days supply | Qty: 4 | Fill #5

## 2023-01-17 ENCOUNTER — Institutional Professional Consult (permissible substitution): Admit: 2023-01-17 | Discharge: 2023-01-18 | Payer: MEDICARE

## 2023-01-17 DIAGNOSIS — Z4509 Encounter for adjustment and management of other cardiac device: Secondary | ICD-10-CM | POA: Diagnosis not present

## 2023-01-17 DIAGNOSIS — I509 Heart failure, unspecified: Secondary | ICD-10-CM | POA: Diagnosis not present

## 2023-01-17 DIAGNOSIS — I5042 Chronic combined systolic (congestive) and diastolic (congestive) heart failure: Secondary | ICD-10-CM | POA: Diagnosis not present

## 2023-01-17 NOTE — Unmapped (Signed)
[facility] Heart Failure Monitoring Device Report    Patient:  Douglas Beltran  Date:  January 17, 2023    Device:   Medtronic - [place] HF cardiologist:   Dr. Johann Capers EP cardiologist:   [facility]   Other cardiologist:  none     PCP:  Rica Records, MD      30 day Report:  There were no readings that exceeded the thresholds.    Optivol device detected intrathoracic impedance that was at baseline, suggesting optimal fluid status.      Please see full interrogation report under Media tab for this encounter.     I reviewed the device-generated report and agree with findings.      Recommendation:  Continue monthly monitoring.      Hope Budds, AGNP

## 2023-01-29 MED ORDER — ATORVASTATIN 80 MG TABLET
ORAL_TABLET | ORAL | 3 refills | 0 days
Start: 2023-01-29 — End: ?

## 2023-01-31 MED ORDER — ATORVASTATIN 80 MG TABLET
ORAL_TABLET | ORAL | 3 refills | 0 days | Status: CP
Start: 2023-01-31 — End: ?

## 2023-02-14 DIAGNOSIS — I214 Non-ST elevation (NSTEMI) myocardial infarction: Principal | ICD-10-CM

## 2023-02-14 MED ORDER — REPATHA SURECLICK 140 MG/ML SUBCUTANEOUS PEN INJECTOR
SUBCUTANEOUS | 11 refills | 56 days | Status: CP
Start: 2023-02-14 — End: ?
  Filled 2023-02-17: qty 4, 56d supply, fill #0

## 2023-02-14 NOTE — Unmapped (Signed)
[facility] Refill Coordination Note    Specialty Medication(s) to be Shipped:   General Specialty: Repatha    Other medication(s) to be shipped: No additional medications requested for fill at this time     [name], DOB: 1941/04/23  Phone: [phone] (home)       All above HIPAA information was verified with patient.     Was a Nurse, learning disability used for this call? No    Completed refill call assessment today to schedule patient's medication shipment from the [facility] [phone]).  All relevant notes have been reviewed.     Specialty medication(s) and dose(s) confirmed: Regimen is correct and unchanged.   Changes to medications: [name] reports no changes at this time.  Changes to insurance: No  New side effects reported not previously addressed with a pharmacist or physician: None reported  Questions for the pharmacist: No    Confirmed patient received a Conservation officer, historic buildings and a Surveyor, mining with first shipment. The patient will receive a drug information handout for each medication shipped and additional FDA Medication Guides as required.       DISEASE/MEDICATION-SPECIFIC INFORMATION        For patients on injectable medications: Patient currently has 0 doses left.  Next injection is scheduled for 02/18/23.    SPECIALTY MEDICATION ADHERENCE     Medication Adherence    Patient reported X missed doses in the last month: 0  Specialty Medication: Repatha  Patient is on additional specialty medications: No              Were doses missed due to medication being on hold? No        REFERRAL TO PHARMACIST     Referral to the pharmacist: Not needed      [facility]     Shipping address confirmed in Epic.     Patient was notified of new phone menu : No    Delivery Scheduled: Yes, Expected medication delivery date: 02/17/23.     Medication will be delivered via Same Day Courier to the prescription address in Epic WAM.    Quintella Reichert   [facility] Specialty Technician

## 2023-02-15 NOTE — Unmapped (Signed)
Refill request received for patient.      Medication Requested: repatha  Last Office Visit: 11/17/2022   Next Office Visit: Visit date not found  Last Prescriber: Frutoso Chase    Nurse refill requirements met? Yes  If not met, why:     Sent to: Pharmacy per protocol  If sent to provider, which provider?:

## 2023-03-03 ENCOUNTER — Encounter
Admit: 2023-03-03 | Discharge: 2023-03-04 | Payer: MEDICARE | Attending: Clinical Cardiac Electrophysiology | Primary: Clinical Cardiac Electrophysiology

## 2023-03-03 ENCOUNTER — Institutional Professional Consult (permissible substitution): Admit: 2023-03-03 | Discharge: 2023-03-04 | Payer: MEDICARE

## 2023-03-03 DIAGNOSIS — Z95810 Presence of automatic (implantable) cardiac defibrillator: Secondary | ICD-10-CM | POA: Diagnosis not present

## 2023-03-03 DIAGNOSIS — I5042 Chronic combined systolic (congestive) and diastolic (congestive) heart failure: Secondary | ICD-10-CM | POA: Diagnosis not present

## 2023-03-03 DIAGNOSIS — I110 Hypertensive heart disease with heart failure: Secondary | ICD-10-CM | POA: Diagnosis not present

## 2023-03-03 DIAGNOSIS — Z4502 Encounter for adjustment and management of automatic implantable cardiac defibrillator: Secondary | ICD-10-CM | POA: Diagnosis not present

## 2023-03-03 DIAGNOSIS — I493 Ventricular premature depolarization: Secondary | ICD-10-CM | POA: Diagnosis not present

## 2023-03-03 NOTE — Unmapped (Signed)
[place] Heart Failure Monitoring Device Report    Patient:  Douglas Beltran  Date:  March 03, 2023    Device:   Medtronic - [place] HF cardiologist:   Dr. Johann Capers EP cardiologist:   [facility]   Other cardiologist:  none     PCP:  Rica Records, MD      30 day Report:  There were no readings that exceeded the thresholds.    Optivol device detected intrathoracic impedance that was at baseline, suggesting optimal fluid status.      Please see full interrogation report under Media tab for this encounter.     I reviewed the device-generated report and agree with findings.      Recommendation:  Continue monthly monitoring.      Hope Budds, AGNP

## 2023-03-28 NOTE — Unmapped (Signed)
[facility] Specialty Pharmacy Refill Coordination Note    Douglas Beltran, DOB: Apr 27, 1941  Phone: [phone] (home)       All above HIPAA information was verified with patient.         03/28/2023     1:18 PM   Specialty Rx Medication Refill Questionnaire   Which Medications would you like refilled and shipped? Repatha, I have one dose left for Saturday April  20th. I will need  to take a new injection on Saturday May 4th so will need delivery before then.   Please list all current allergies: iodine iv   Have you missed any doses in the last 30 days? No   Have you had any changes to your medication(s) since your last refill? No   How many days remaining of each medication do you have at home? 1   Have you experienced any side effects in the last 30 days? No   Please enter the full address (street address, city, state, zip code) where you would like your medication(s) to be delivered to. [address] [place], [place] [address]   Please specify on which day you would like your medication(s) to arrive. Note: if you need your medication(s) within 3 days, please call the pharmacy to schedule your order at [phone]  04/14/2023   Has your insurance changed since your last refill? No   Would you like a pharmacist to call you to discuss your medication(s)? No   Do you require a signature for your package? (Note: if we are billing Medicare Part B or your order contains a controlled substance, we will require a signature) No         Completed refill call assessment today to schedule patient's medication shipment from the [facility] [phone]).  All relevant notes have been reviewed.       Confirmed patient received a Conservation officer, historic buildings and a Surveyor, mining with first shipment. The patient will receive a drug information handout for each medication shipped and additional FDA Medication Guides as required.         REFERRAL TO PHARMACIST     Referral to the pharmacist: Not needed      [facility] Shipping address confirmed in Epic.     Delivery Scheduled: Yes, Expected medication delivery date: 04/14/23.     Medication will be delivered via Same Day Courier to the prescription address in Epic WAM.    Douglas Beltran   [facility] Specialty Technician

## 2023-04-04 ENCOUNTER — Institutional Professional Consult (permissible substitution): Admit: 2023-04-04 | Discharge: 2023-04-05 | Payer: MEDICARE

## 2023-04-04 DIAGNOSIS — I5042 Chronic combined systolic (congestive) and diastolic (congestive) heart failure: Secondary | ICD-10-CM | POA: Diagnosis not present

## 2023-04-04 DIAGNOSIS — Z4502 Encounter for adjustment and management of automatic implantable cardiac defibrillator: Secondary | ICD-10-CM | POA: Diagnosis not present

## 2023-04-05 NOTE — Unmapped (Signed)
[place] Heart Failure Monitoring Device Report    Patient:  Douglas Beltran  Date:  April 05, 2023    Device:   Medtronic - [place] HF cardiologist:   Dr. Carin Hock   [facility] EP cardiologist:   Dr. Sheran Luz   Other cardiologist:   Dr. Alanda Slim      PCP:  Rica Records, MD      30 day Report:  There were no readings that exceeded the thresholds.    Optivol device detected intrathoracic impedance that was at baseline, suggesting optimal fluid status.        Please see full interrogation report under Media tab for this encounter.     I reviewed the device-generated report and agree with findings.      Recommendation:  Continue monthly monitoring.      [name], ANP

## 2023-04-14 MED FILL — REPATHA SURECLICK 140 MG/ML SUBCUTANEOUS PEN INJECTOR: SUBCUTANEOUS | 56 days supply | Qty: 4 | Fill #1

## 2023-05-05 ENCOUNTER — Institutional Professional Consult (permissible substitution): Admit: 2023-05-05 | Discharge: 2023-05-05 | Payer: MEDICARE

## 2023-05-05 DIAGNOSIS — I5042 Chronic combined systolic (congestive) and diastolic (congestive) heart failure: Secondary | ICD-10-CM | POA: Diagnosis not present

## 2023-05-05 DIAGNOSIS — Z4509 Encounter for adjustment and management of other cardiac device: Secondary | ICD-10-CM | POA: Diagnosis not present

## 2023-05-05 DIAGNOSIS — I509 Heart failure, unspecified: Secondary | ICD-10-CM | POA: Diagnosis not present

## 2023-05-06 DIAGNOSIS — I2510 Atherosclerotic heart disease of native coronary artery without angina pectoris: Secondary | ICD-10-CM | POA: Diagnosis not present

## 2023-05-06 DIAGNOSIS — I130 Hypertensive heart and chronic kidney disease with heart failure and stage 1 through stage 4 chronic kidney disease, or unspecified chronic kidney disease: Secondary | ICD-10-CM | POA: Diagnosis not present

## 2023-05-06 DIAGNOSIS — E1122 Type 2 diabetes mellitus with diabetic chronic kidney disease: Secondary | ICD-10-CM | POA: Diagnosis not present

## 2023-05-06 DIAGNOSIS — N189 Chronic kidney disease, unspecified: Secondary | ICD-10-CM | POA: Diagnosis not present

## 2023-05-06 DIAGNOSIS — E785 Hyperlipidemia, unspecified: Secondary | ICD-10-CM | POA: Diagnosis not present

## 2023-05-06 DIAGNOSIS — Z79899 Other long term (current) drug therapy: Secondary | ICD-10-CM | POA: Diagnosis not present

## 2023-05-06 DIAGNOSIS — I509 Heart failure, unspecified: Secondary | ICD-10-CM | POA: Diagnosis not present

## 2023-05-06 NOTE — Unmapped (Signed)
[place] Heart Failure Monitoring Device Report    Patient:  Douglas Beltran  Date:  May 06, 2023    Device:   Medtronic - [place] HF cardiologist:   Dr. Carin Hock   [facility] EP cardiologist:   Dr. Sheran Luz   Other cardiologist:   Dr. Alanda Slim      PCP:  Rica Records, MD      30 day Report:  There were no readings that exceeded the thresholds.    Optivol device detected intrathoracic impedance that was at baseline, suggesting optimal fluid status.      Please see full interrogation report under Media tab for this encounter.     I reviewed the device-generated report and agree with findings.      Recommendation:  Continue monthly monitoring.      [name], ANP

## 2023-05-25 NOTE — Unmapped (Signed)
[facility] Refill Coordination Note    Specialty Lite Medication(s) to be Shipped:   General Specialty: Repatha 140mg /ml    Other medication(s) to be shipped: No additional medications requested for fill at this time     Douglas Beltran, DOB: [birth year]  Phone: [phone] (home)       All above HIPAA information was verified with patient.     Was a Nurse, learning disability used for this call? No    Changes to medications: Douglas Beltran reports no changes at this time.  Changes to insurance: No      REFERRAL TO PHARMACIST     Referral to the pharmacist: Not needed      [facility]     Shipping address confirmed in Epic.     Delivery Scheduled: Yes, Expected medication delivery date: 06/02/23.     Medication will be delivered via Same Day Courier to the prescription address in Epic WAM.    Douglas Beltran   [facility] Specialty Technician

## 2023-05-31 NOTE — Unmapped (Signed)
DIVISION OF CARDIOLOGY   [employer] [place]        Date of Service: 01/20/22    Referring Provider: Rica Records, *   Judithann Sheen, Lemmie Evens, MD  [address]  [facility] [place],  [place] [address]   Primary Provider: Rica Records, MD   [address] [facility] [place] [address]     Reason for Visit:  Douglas Beltran is a 82 y.o. male with history of severe three-vessel CAD, type 2 diabetes, HLD, MDD referred by Rica Records, * for management of systolic and diastolic heart failure.    Assessment & Plan:    1.  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  Was admitted to med D in May 2022 with NSTEMI found to have worsening HFrEF with EF of 15-20%, grade 2 diastolic dysfunction on echo on 06/18/2020. Echo 04/2021 showed EF 25% with grade I diastolic dysfunction. 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    - Continue entresto 97-103 mg  - Continue carvedilol 25 mg bid.  - Continue Jardiance 10 mg daily.  - Discontinued spironolactone due to low BP and syncope episodes at last visit. May restart in the future.  - Has not taken Lasix in two years.  - Most recent echo from 07/12/22 notes LVEF is visually estimated at 25% and is overall it is unchanged from prior echoes, decreased contractile function involving the mid anterolateral and basal anterolateral segment(s), and grade I diastolic dysfunction (impaired relaxation).   - He continues to walk with some SOB but is able to continue out daily activities without limitations.     2. Coronary artery disease: Diagnosed with multivessel disease in 2013 with subsequent PCI. NSTEMI with PCI to proximal LAD and mid RCA in May 2021. No symptoms of angina at this time.  -Continue aspirin, Plavix, atorvastatin 80 mg  -- Continue evolocumab  -- LDL of 20 on 05/06/23  -- He is taking acetaminophen but encouraged him to limit intake to 2 grams/day    3. HTN  - BP today is well controlled at 124/70  - Advised him when he stands to take a moment to stand still before continuing to move, and when lightheaded he should take a moment to sit. I encouraged him to wear compression socks.    Return in about 1 year (around 05/31/2024).       History of Present Illness:  Douglas Beltran with history of severe three-vessel CAD, type 2 diabetes, HLD, MDD is being seen at the [facility] for follow-up of his coronary artery disease.     The patient's cardiac history started 2013 when he presented to [facility] with shortness of breath and chest tightness was found to have extensive CAD.  At the time was referred for CABG, but patient opted for PCI with placement of stents to 80% proximal mid LAD lesion, 70% mid RCA lesion, and 80% distal RCA stenosis.  At approximately the same time he was diagnosed with ischemic cardiomyopathy, at the time EF 30-35%.  In May 2021, while walking around the mall, had significant shortness of breath.  Over the following days his shortness of breath became progressively worse in addition to the start of chest tightness and pressure which prompted him to go to the urgent care, and subsequently sent to the emergency  room at [facility].  At that time he underwent left and right heart cath which showed severe three-vessel disease, and an echo done at that time showed an EF of 15-20%.  He was subsequently referred to [facility] for CABG eval versus complex PCI.     Patient had cardiac MRI done on 04/14/20 which showed a severe wall motion abnormalities, EF approximately 19%, and significant scarring of apical septal wall but otherwise had viable cardiac tissue, but was deemed to high risk for CABG.  He underwent PCI with 2 DES placed to LM/LAD and RCA.  Since his hospitalization, patient has undergone placement of a BiV ICD.  Repeat echo 2 months following PCI showed similar results to prior, EF of 15-20%, grade 2 diastolic dysfunction.    Mr. Douglas Beltran presents today for a follow-up in clinic. He has an ankle injury due to hitting it on a box in his garage. He continues to be fatigued and has been increasingly SOB. His wife mentions that when he goes from sitting to standing he is SOB and is SOB when walking. He denies LEE. He has been walking less due to back pain.    Cardiovascular History & Procedures:  Cath / PCI:  PCI 04/15/20:  PCI was performed of the 99% heavily calcificed Left Main and LAD. Rotational atherectomy was performed using a 1.75 mm burr. PCI was performed with a 4.0x38 Xience (LM-LAD).      PCI was performed of the 90% heavily calcificed RCA. Rotational atherectomy was performed using a 1.75 mm burr. PCI was performed with a 4.0x38 and 3.5x30 with rota (post to 4.5).      Excellent angiographic result with TIMI 3 flow.     CV Surgery: None    EP Procedures and Devices:  BiV ICD placed 08/04/20    Non-Invasive Evaluation(s):  Echo:  07/12/22    1. The left ventricle is normal in size with normal wall thickness.    2. The left ventricular systolic function is moderately decreased, LVEF is  visually estimated at 25%. Overall it is unchanged from prior echoes.    3. There is decreased contractile function involving the mid anterolateral  and basal anterolateral segment(s).    4. There is grade I diastolic dysfunction (impaired relaxation).    5. The aortic valve is trileaflet with mildly thickened leaflets with normal  excursion.    6. The left atrium is mildly dilated in size.    7. The right ventricle is normal in size, with normal systolic function.    8. The right atrium is mildly dilated  in size.    04/03/21:    1. The left ventricle is normal in size with normal wall thickness.    2. The left ventricular systolic function is severely decreased, LVEF is  visually estimated at 25%.    3. There is decreased contractile function involving the mid anterolateral  and basal anterolateral segment(s).    4. There is grade I diastolic dysfunction (impaired relaxation).    5. The mitral valve leaflets are mildly thickened with normal leaflet  mobility.    6. There is mild mitral valve regurgitation.    7. The left atrium is mildly dilated in size.    8. The aortic valve is trileaflet with mildly thickened leaflets with normal  excursion.    9. The right ventricle is mildly dilated in size, with normal systolic  function.    10. The right atrium is upper normal  in size.  06/18/20:  Summary    1. Limited study to assess ventricular function.    2. The left ventricle is mildly dilated in size with mildly increased wall  thickness.    3. The left ventricular systolic function is severely decreased, LVEF is  visually estimated at 15-20%.    4. The inferior wall, basal inferolateral wall, and mid inferolateral wall  are akinetic.    5. There is grade II diastolic dysfunction (elevated filling pressure).    6. The left atrium is mildly dilated in size.    7. The aortic valve is probably trileaflet with mildly thickened leaflets  with normal excursion.    8. The right ventricle is mildly dilated in size, with normal systolic  function.    CT/MRI/Nuclear Tests:  Cardiac MRI 04/14/20:    Left ventricle is dilated (index LVEDV is 191 mL/m2) and there are severe wall motion abnormalities with reduced ejection fraction (LVEF is 19%).     There is scarring of the apical septal wall involving 50-75% thickness. Otherwise, no other areas of myocardial scarring/fibrosis can be convincingly identified.     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     Past Medical History:   Diagnosis Date    CHF (congestive heart failure) (CMS-HCC)     Coronary artery disease     Diabetes mellitus (CMS-HCC)     Hypertension 07/12/2014    Macrocytic anemia     Mixed hyperlipidemia 07/12/2014        Past Surgical History:   Procedure Laterality Date    ABDOMINAL SURGERY      EYE SURGERY      JOINT REPLACEMENT      PR CATH PLACE/CORON ANGIO, IMG SUPER/INTERP,W LEFT HEART VENTRICULOGRAPHY N/A 04/15/2020    Procedure: CATH LEFT HEART CATHETERIZATION W INTERVENTION;  Surgeon: Rosana Hoes, MD;  Location: [facility] CATH;  Service: Cardiology    PR INSJ/RPLCMT PERM DFB W/TRNSVNS LDS 1/DUAL [facility] N/A 08/04/2020    Procedure: Implant Biventricular ICD System;  Surgeon: Dorcas Carrow, MD;  Location: [facility] EP;  Service: Cardiology         Allergies:  Iodinated contrast media and Iodine    Current Medications:  Current Outpatient Medications   Medication Sig Dispense Refill    ACCU-CHEK GUIDE TEST STRIPS Strp TEST BLOOD SUGAR TWICE DAILY AS INSTRUCTED      ACCU-CHEK SOFTCLIX LANCETS lancets USE AS DIRECTED TWICE DAILY      acetaminophen (TYLENOL) 500 MG tablet Take 1 tablet (500 mg total) by mouth every six (6) hours as needed for pain.      amitriptyline (ELAVIL) 10 MG tablet Take 1 tablet (10 mg total) by mouth nightly.      amoxicillin (AMOXIL) 500 MG capsule TAKE 4 CAPSULES BY MOUTH 1 HOUR PRIOR TO APPOINTMENT      aspirin (ECOTRIN) 81 MG tablet Take 1 tablet (81 mg total) by mouth daily. Takes 3 days a week      atorvastatin (LIPITOR) 80 MG tablet TAKE 1 TABLET(80 MG) BY MOUTH DAILY 90 tablet 3    carvediloL (COREG) 25 MG tablet TAKE 1 TABLET(25 MG) BY MOUTH TWICE DAILY 180 tablet 1    empagliflozin (JARDIANCE) 10 mg tablet Take 1 tablet (10 mg total) by mouth daily. 90 tablet 3    empty container (SHARPS-A-GATOR DISPOSAL SYSTEM) Misc Use as directed for sharps disposal 1 each 2    empty container (SHARPS-A-GATOR DISPOSAL SYSTEM) Misc Use as directed for sharps disposal  1 each 2    evolocumab (REPATHA SURECLICK) 140 mg/mL PnIj Inject the contents of 1 pen (140 mg) under the skin every fourteen (14) days. 4 mL 11    metFORMIN (GLUCOPHAGE) 1000 MG tablet Take 1 tablet (1,000 mg total) by mouth in the morning and 1 tablet (1,000 mg total) in the evening. Take with meals.      sacubitril-valsartan (ENTRESTO) 97-103 mg tablet Take 1 tablet by mouth two (2) times a day. 180 tablet 3    sertraline (ZOLOFT) 50 MG tablet Take 1 tablet (50 mg total) by mouth daily.       No current facility-administered medications for this visit.       Family History:  There is no family history of premature coronary artery disease or sudden cardiac death.    Social history:  Former smoker, quit 31 years ago    Review of Systems:  A full review of 10 systems is unremarkable except as stated in the HPI.     Physical Exam:  VITAL SIGNS:     Vitals:    06/01/23 1015   BP: 124/70   Pulse: 65   SpO2: 98%          Wt Readings from Last 3 Encounters:   06/01/23 90.2 kg (198 lb 12.8 oz)   11/17/22 93.4 kg (205 lb 12.8 oz)   08/18/22 89 kg (196 lb 3.2 oz)        GENERAL: Well-appearing in no acute distress  HEENT: Normocephalic and atraumatic with anicteric sclerae    NECK: Supple, without lymphadenopathy or thyromegaly. JVP not elevated. There are no carotid bruits  CARDIOVASCULAR: RRR, S1/S2 heard, no S3, no murmurs or gallops  RESPIRATORY: Clear to auscultation without wheezes or crackles.   ABDOMEN: Soft, non-tender, non-distended with audible bowel sounds. There is no organomegaly or palpable pulsatile mass.   EXTREMITIES:  Lower extremities are warm and without edema. Distal pulses are full and symmetric.  SKIN: No rashes, ecchymosis or petechiae.  NEURO: Alert, pleasant, and appropriate. Non-focal neuro exam    Pertinent Laboratory Studies:   Lab Results   Component Value Date    PRO-BNP 4,040.0 (H) 04/23/2020    PRO-BNP 4,710.0 (H) 04/10/2020    Creatinine 1.40 (H) 08/18/2022    Creatinine 1.40 (H) 08/18/2022    Creatinine 1.33 (H) 01/19/2012    Creatinine 1.19 01/18/2012    BUN 32 (H) 08/18/2022    BUN 32 (H) 08/18/2022    BUN 33 (H) 01/19/2012    BUN 29 (H) 01/18/2012    Potassium 5.0 (H) 08/18/2022    Potassium 5.0 (H) 08/18/2022    Potassium 4.5 01/19/2012    Potassium 4.7 01/18/2012    Magnesium 2.8 (H) 04/23/2020    Magnesium 2.5 (H) 04/16/2020    Magnesium 2.2 01/18/2012    AST 69 (H) 08/18/2022    AST 33 04/25/2020    AST 28 01/18/2012    ALT 94 (H) 08/18/2022    ALT 31 04/25/2020    ALT 29 01/18/2012    Total Bilirubin 0.4 08/18/2022    Total Bilirubin 0.9 04/25/2020    Total Bilirubin 0.5 01/18/2012    INR 1.08 04/10/2020    INR 1.0 01/18/2012    WBC 8.2 08/18/2022    WBC 10.6 01/19/2012    HGB 11.5 (L) 08/18/2022    HGB 12.6 (L) 01/19/2012    HCT 33.3 (L) 08/18/2022    HCT 36.8 (L) 01/19/2012    Platelet 307 08/18/2022  Platelet 141 (L) 01/19/2012    Triglycerides 87 01/20/2022    Triglycerides 215 (H) 05/17/2012    HDL 45 01/20/2022    HDL 34 (L) 05/17/2012    Non-HDL Cholesterol 35 (L) 01/20/2022    LDL Calculated 18 (L) 01/20/2022    LDL Cholesterol, Calculated 112 05/17/2012       Dictated using Dragon software, please excuse typos.    Scribe Attestation:         This document serves as a record of the services and decisions performed by Molli Hazard A. Hessie Dibble, MD on 06/01/2023. It was created on his behalf by Fayette Pho, a trained medical scribe. The creation of this document is based on the provider's statements and observations that were conveyed to the medical scribe during the patient's encounter.     (The information in this document, created by the medical scribe for me, accurately reflects the services I personally performed and the decisions made by me. I have reviewed and approved this document for accuracy.)    I personally spent 35 minutes face-to-face and non-face-to-face in the care of this patient, which includes all pre, intra, and post visit time on the date of service.      Artist Pais Hessie Dibble, MD, MPH  Associate Professor of Medicine  [facility] [place]  [email or web address]   -    Office [phone]

## 2023-06-01 ENCOUNTER — Ambulatory Visit: Admit: 2023-06-01 | Discharge: 2023-06-02 | Payer: MEDICARE

## 2023-06-01 DIAGNOSIS — I10 Essential (primary) hypertension: Secondary | ICD-10-CM | POA: Diagnosis not present

## 2023-06-01 DIAGNOSIS — I5022 Chronic systolic (congestive) heart failure: Secondary | ICD-10-CM | POA: Diagnosis not present

## 2023-06-01 DIAGNOSIS — I2510 Atherosclerotic heart disease of native coronary artery without angina pectoris: Secondary | ICD-10-CM | POA: Diagnosis not present

## 2023-06-02 ENCOUNTER — Encounter
Admit: 2023-06-02 | Discharge: 2023-06-02 | Payer: MEDICARE | Attending: Clinical Cardiac Electrophysiology | Primary: Clinical Cardiac Electrophysiology

## 2023-06-02 DIAGNOSIS — Z95810 Presence of automatic (implantable) cardiac defibrillator: Secondary | ICD-10-CM | POA: Diagnosis not present

## 2023-06-02 DIAGNOSIS — Z4502 Encounter for adjustment and management of automatic implantable cardiac defibrillator: Secondary | ICD-10-CM | POA: Diagnosis not present

## 2023-06-02 MED FILL — REPATHA SURECLICK 140 MG/ML SUBCUTANEOUS PEN INJECTOR: SUBCUTANEOUS | 56 days supply | Qty: 4 | Fill #2

## 2023-06-06 ENCOUNTER — Ambulatory Visit: Admit: 2023-06-06 | Discharge: 2023-06-07 | Payer: MEDICARE

## 2023-06-07 DIAGNOSIS — Z961 Presence of intraocular lens: Secondary | ICD-10-CM | POA: Diagnosis not present

## 2023-06-07 DIAGNOSIS — Z0100 Encounter for examination of eyes and vision without abnormal findings: Secondary | ICD-10-CM | POA: Diagnosis not present

## 2023-06-07 DIAGNOSIS — H30103 Unspecified disseminated chorioretinal inflammation, bilateral: Secondary | ICD-10-CM | POA: Diagnosis not present

## 2023-06-07 DIAGNOSIS — E119 Type 2 diabetes mellitus without complications: Secondary | ICD-10-CM | POA: Diagnosis not present

## 2023-06-28 ENCOUNTER — Ambulatory Visit: Admit: 2023-06-28 | Discharge: 2023-06-29 | Payer: MEDICARE

## 2023-06-28 DIAGNOSIS — I5042 Chronic combined systolic (congestive) and diastolic (congestive) heart failure: Secondary | ICD-10-CM | POA: Diagnosis not present

## 2023-07-15 NOTE — Unmapped (Signed)
[place] Heart Failure Monitoring Device Report    Patient:  Douglas Beltran  Date:  July 15, 2023    Device:   [employer] - [place] HF cardiologist:   Dr. Carin Hock   [facility] EP cardiologist:   Dr. Sheran Luz   Other cardiologist:   Dr. Alanda Slim      PCP:  Rica Records, MD      30 day Report:  There were readings that exceeded the thresholds.    OptiVol device detected intrathoracic impedance that was below the threshold, suggesting increased fluid retention (OptiVol fluid index >120).  The thoracic impedence resolved and was within reference range at the time of this report.    Please see full interrogation report under Media tab for this encounter.     I reviewed the device-generated report and agree with findings.      Recommendation:  Continue monthly monitoring.      [name], ANP

## 2023-07-15 NOTE — Unmapped (Signed)
[facility] Refill Coordination Note    Specialty Lite Medication(s) to be Shipped:   Repatha    Other medication(s) to be shipped: No additional medications requested for fill at this time     Douglas Beltran, DOB: 28-Feb-1941  Phone: [phone] (home)       All above HIPAA information was verified with patient.     Was a Nurse, learning disability used for this call? No    Changes to medications: [name] reports no changes at this time.  Changes to insurance: No      REFERRAL TO PHARMACIST     Referral to the pharmacist: Not needed      [facility]     Shipping address confirmed in Epic.     Delivery Scheduled: Yes, Expected medication delivery date: 08/09.     Medication will be delivered via Same Day Courier to the prescription address in Epic WAM.    Antonietta Barcelona   [facility] Specialty Technician

## 2023-07-22 MED FILL — REPATHA SURECLICK 140 MG/ML SUBCUTANEOUS PEN INJECTOR: SUBCUTANEOUS | 56 days supply | Qty: 4 | Fill #3

## 2023-08-02 ENCOUNTER — Institutional Professional Consult (permissible substitution): Admit: 2023-08-02 | Discharge: 2023-08-02 | Payer: MEDICARE

## 2023-08-02 DIAGNOSIS — I5042 Chronic combined systolic (congestive) and diastolic (congestive) heart failure: Secondary | ICD-10-CM | POA: Diagnosis not present

## 2023-08-03 NOTE — Unmapped (Signed)
Referring Provider: Oneita Hurt, None Per Patient  [address] [place],  [place] [address]   Primary Provider: Rica Records, MD  [address] [facility] [place] [address]   Other Providers:  Dr Charlies Constable EP Follow Up Note    Reason for Visit:  Douglas Beltran is a 82 y.o. male being seen for routine visit and continued care of his BiV ICD .    Assessment & Plan:    1. Ischemic Cardiomyopathy, HFrEF  s/p Medtronic CRT-D  Device checked by device nurse  I have reviewed and agree with the findings  Normal device function  A pace - 12%  BiV pace -  92%  Events - none  Battery - 8 years  Underlying Rhythm - SB  Device Dependent - no  Continue remotes     Dip in BiV pacing in late May, early June. This is slowly recovering. Likely driven by PVCs. Overall PVC burden not currently elevated. Continue carvedilol. This also correlates with an increase in Optivol which has since recovered.    brief atrial lead noise. Not reproducible in clinic    2. Coronary Artery Disease  Diagnosed with multivessel disease in 2013 with subsequent PCI.   NSTEMI with PCI to proximal LAD and mid RCA in May 2021.   aspirin, Plavix, atorvastatin 80 mg       ECG: V paced, 58bpm. See official ECG report.    Follow-up:  Return for annual device check.    History of Present Illness:  Douglas Beltran is a 82 y.o. male with a past medical history of severe three-vessel CAD, type 2 diabetes, HLD, MDD is being seen in follow-up s/p BiV-ICD     Interval History: Continues to do well. No CP, SOB, or palpitations. Does not recall anything different in May/June. No swelling, SOB, or fatigue. Just had his 82th birthday.       Cardiovascular History & Procedures:    Cath / PCI:  04/15/20 Left heart catheterization  PCI was performed of the 99% heavily calcificed Left Main and LAD. Rotational atherectomy was performed using a 1.75 mm burr. PCI was performed with a 4.0x38 Xience (LM-LAD).   PCI was performed of the 90% heavily calcificed RCA. Rotational atherectomy was performed using a 1.75 mm burr. PCI was performed with a 4.0x38 and 3.5x30 with rota (post to 4.5).   Excellent angiographic result with TIMI 3 flow    **    CV Surgery:  **    EP Procedures and Devices:  08/04/2020 Medtronic BiV ICD implant  **    Non-Invasive Evaluation(s):  Echo:  07/12/2022 TTE    1. The left ventricle is normal in size with normal wall thickness.    2. The left ventricular systolic function is moderately decreased, LVEF is  visually estimated at 25%. Overall it is unchanged from prior echoes.    3. There is decreased contractile function involving the mid anterolateral  and basal anterolateral segment(s).    4. There is grade I diastolic dysfunction (impaired relaxation).    5. Aortic valve trileaflet with mildly thickened leaflets with normal excursion    6. The left atrium is mildly dilated in size.    7. The right ventricle is normal in size, with normal systolic function.    8. The right atrium is mildly dilated  in size.    04/03/2021 TTE    1. The left ventricle is  normal in size with normal wall thickness.    2. LV systolic function severely decreased, LVEF estimated at 25%.    3. There is decreased contractile function involving the mid anterolateral and basal anterolateral segment(s).    4. There is grade I diastolic dysfunction (impaired relaxation).    5. Mitral valve leaflets are mildly thickened with normal leaflet mobility    6. There is mild mitral valve regurgitation.    7. The left atrium is mildly dilated in size.    8. Aortic valve is trileaflet with mildly thickened leaflets with normal excursion    9. Right ventricle is mildly dilated in size, with normal systolic function.    10. The right atrium is upper normal  in size.  **    Cardiac CT/MRI/Nuclear Tests:  **               Other Past Medical History:  See below for the complete EPIC list of past medical and surgical history.      Allergies:  Iodinated contrast media and Iodine    Current Medications:    Current Outpatient Medications on File Prior to Visit   Medication Sig    amitriptyline (ELAVIL) 10 MG tablet Take 1 tablet (10 mg total) by mouth nightly.    aspirin (ECOTRIN) 81 MG tablet Take 1 tablet (81 mg total) by mouth daily. Takes 3 days a week    atorvastatin (LIPITOR) 80 MG tablet TAKE 1 TABLET(80 MG) BY MOUTH DAILY    carvediloL (COREG) 25 MG tablet TAKE 1 TABLET(25 MG) BY MOUTH TWICE DAILY    empagliflozin (JARDIANCE) 10 mg tablet Take 1 tablet (10 mg total) by mouth daily.    evolocumab (REPATHA SURECLICK) 140 mg/mL PnIj Inject the contents of 1 pen (140 mg) under the skin every fourteen (14) days.    metFORMIN (GLUCOPHAGE) 1000 MG tablet Take 1 tablet (1,000 mg total) by mouth in the morning and 1 tablet (1,000 mg total) in the evening. Take with meals.    sacubitril-valsartan (ENTRESTO) 97-103 mg tablet Take 1 tablet by mouth two (2) times a day.    sertraline (ZOLOFT) 50 MG tablet Take 1 tablet (50 mg total) by mouth daily.    ACCU-CHEK GUIDE TEST STRIPS Strp TEST BLOOD SUGAR TWICE DAILY AS INSTRUCTED    ACCU-CHEK SOFTCLIX LANCETS lancets USE AS DIRECTED TWICE DAILY    acetaminophen (TYLENOL) 500 MG tablet Take 1 tablet (500 mg total) by mouth every six (6) hours as needed for pain.    amoxicillin (AMOXIL) 500 MG capsule TAKE 4 CAPSULES BY MOUTH 1 HOUR PRIOR TO APPOINTMENT    empty container (SHARPS-A-GATOR DISPOSAL SYSTEM) Misc Use as directed for sharps disposal    empty container (SHARPS-A-GATOR DISPOSAL SYSTEM) Misc Use as directed for sharps disposal     No current facility-administered medications on file prior to visit.       Family History:  The patient's family history includes Diabetes in his father; Heart disease in his brother and father.    Social history:  He  reports that he has quit smoking. He has never used smokeless tobacco.    Review of Systems:  As per HPI and as follows.  Rest of the review of ten systems is negative or unremarkable except as stated above.    Physical Exam:  VITAL SIGNS:   Vitals:    08/04/23 0920   BP: 131/75   Pulse: 59   SpO2: 98%  Wt Readings from Last 3 Encounters:   08/04/23 90.4 kg (199 lb 6.4 oz)   06/01/23 90.2 kg (198 lb 12.8 oz)   11/17/22 93.4 kg (205 lb 12.8 oz)      Today's Body mass index is 25.59 kg/m??.   Height: 188 cm (6' 2.02)    GENERAL: well-appearing in no acute distress  HEENT: Normocephalic and atraumatic. Conjunctivae and sclerae clear and anicteric.    NECK: Supple.   CARDIOVASCULAR: Rate and rhythm are regular.  Normal S1, S2. There is no murmur, gallops or rubs  RESPIRATORY: Normal respiratory effort. There are no wheezes.  ABDOMEN: Soft, non-tender, Abdomen nondistended.    EXTREMITIES: There is no pedal edema, bilaterally.   SKIN: No rashes, ecchymosis or petechiae.Warm, well perfused. Well healed left sided  Pacemaker/ICD scar   NEURO/PSYCH: Alert and oriented x 3. Affect appropriate.  Nonfocal    Pertinent Laboratory Studies:   Clinical Support on 08/04/2023   Component Date Value Ref Range Status    EKG Ventricular Rate 08/04/2023 58  BPM Preliminary    EKG Atrial Rate 08/04/2023 58  BPM Preliminary    EKG P-R Interval 08/04/2023 148  ms Preliminary    EKG QRS Duration 08/04/2023 152  ms Preliminary    EKG Q-T Interval 08/04/2023 450  ms Preliminary    EKG QTC Calculation 08/04/2023 441  ms Preliminary    EKG Calculated P Axis 08/04/2023 82  degrees Preliminary    EKG Calculated R Axis 08/04/2023 -81  degrees Preliminary    EKG Calculated T Axis 08/04/2023 47  degrees Preliminary    QTC Fredericia 08/04/2023 444  ms Preliminary   Orders Only on 05/21/2023   Component Date Value Ref Range Status    Session datetime 05/21/2023 2023-05-21 03:24:04   Final    Session type 05/21/2023 Remote   Final    Manufacturer 05/21/2023 MDT   Final    Device type 05/21/2023 CRT-D   Final    Model number 05/21/2023 [id number]   Final    Serial number 05/21/2023 [id number]   Final    Implant date 05/21/2023 2020-08-04 Final    Battery remaining longevity 05/21/2023 78.0   Final    Battery voltage 05/21/2023 2.910   Final    Battery replacement trigger 05/21/2023 2.800   Final    Capacitor charge time 05/21/2023 4.000   Final    Atrial pacing percent 05/21/2023 16.60   Final    RV pacing percent 05/21/2023 87.40   Final    AT burden percent 05/21/2023 86.20   Final    RA intrinsic amplitude 05/21/2023 0.900   Final    RA programmed sensitivity 05/21/2023 0.45   Final    RA impedance 05/21/2023 627   Final    RA threshold amplitude 05/21/2023 0.625   Final    RA threshold pulsewidth 05/21/2023 0.4   Final    RA threshold date 05/21/2023 2023-05-21   Final    RA pacing amplitude 05/21/2023 1.500   Final    RA pacing pulsewidth 05/21/2023 0.4   Final    RV intrinsic amplitude 05/21/2023 2.400   Final    RV programmed sensitivity 05/21/2023 0.30   Final    RV impedance 05/21/2023 399   Final    RV threshold amplitude 05/21/2023 0.625   Final    RV threshold pulsewidth 05/21/2023 0.4   Final    RV threshold date 05/21/2023 2023-05-21   Final    RV pacing amplitude 05/21/2023 2.000  Final    RV pacing pulsewidth 05/21/2023 0.4   Final    LV impedance 05/21/2023 589   Final    LV threshold amplitude 05/21/2023 1.250   Final    LV threshold pulsewidth 05/21/2023 0.4   Final    LV threshold date 05/21/2023 2023-05-21   Final    LV pacing amplitude 05/21/2023 1.750   Final    LV pacing pulsewidth 05/21/2023 0.4   Final    Brady mode 05/21/2023 DDDR   Final    Paced chamber 05/21/2023 BiV   Final    Lower rate 05/21/2023 50   Final    Mode switch rate 05/21/2023 171   Final    Upper tracking rate 05/21/2023 130   Final    Upper sensor rate 05/21/2023 130   Final    Paced AV delay 05/21/2023 130   Final    Sensed AV delay 05/21/2023 100   Final    LVRV delay 05/21/2023 0   Final    Shocks delivered recent 05/21/2023 0   Final    Shocks aborted recent 05/21/2023 0   Final    ATP delivered recent 05/21/2023 0   Final    RV hv impedance 05/21/2023 46   Final    Zone type 05/21/2023 AT/AF   Final    Rate 1 05/21/2023 171   Final    Therapies 05/21/2023 All Rx Off   Final    Status 05/21/2023 Monitor   Final    Zone id 05/21/2023 2   Final    Zone type 05/21/2023 VF   Final    Rate 1 05/21/2023 188   Final    Therapies 05/21/2023 ATP During Charging, 40.0Jx6   Final    Status 05/21/2023 On   Final    Zone id 05/21/2023 3   Final    Zone type 05/21/2023 VT   Final    Status 05/21/2023 Off   Final    Zone id 05/21/2023 4   Final    Zone type 05/21/2023 VT   Final    Status 05/21/2023 Off   Final    Zone id 05/21/2023 5   Final    Zone type 05/21/2023 VT   Final    Status 05/21/2023 Off   Final    Zone id 05/21/2023 6   Final       Lab Results   Component Value Date    PRO-BNP 4,040.0 (H) 04/23/2020    PRO-BNP 4,710.0 (H) 04/10/2020    Creatinine 1.40 (H) 08/18/2022    Creatinine 1.40 (H) 08/18/2022    Creatinine 1.33 (H) 01/19/2012    Creatinine 1.19 01/18/2012    BUN 32 (H) 08/18/2022    BUN 32 (H) 08/18/2022    BUN 33 (H) 01/19/2012    BUN 29 (H) 01/18/2012    Sodium 138 08/18/2022    Sodium 138 08/18/2022    Sodium 143 01/19/2012    Potassium 5.0 (H) 08/18/2022    Potassium 5.0 (H) 08/18/2022    Potassium 4.5 01/19/2012    CO2 26.1 08/18/2022    CO2 26.1 08/18/2022    CO2 25 01/19/2012    Magnesium 2.8 (H) 04/23/2020    Magnesium 2.2 01/18/2012    Total Bilirubin 0.4 08/18/2022    Total Bilirubin 0.5 01/18/2012    INR 1.08 04/10/2020    INR 1.0 01/18/2012       No results found for: DIGOXIN    Lab Results   Component  Value Date    Cholesterol 80 01/20/2022    Cholesterol, Total 189 05/17/2012    Triglycerides 87 01/20/2022    Triglycerides 215 (H) 05/17/2012    HDL 45 01/20/2022    HDL 34 (L) 05/17/2012    Non-HDL Cholesterol 35 (L) 01/20/2022    LDL Calculated 18 (L) 01/20/2022    LDL Cholesterol, Calculated 112 05/17/2012       Lab Results   Component Value Date    WBC 8.2 08/18/2022    WBC 10.6 01/19/2012    HGB 11.5 (L) 08/18/2022    HGB 12.6 (L) 01/19/2012    HCT 33.3 (L) 08/18/2022    HCT 36.8 (L) 01/19/2012    Platelet 307 08/18/2022    Platelet 141 (L) 01/19/2012       Past Medical History:   Diagnosis Date    CHF (congestive heart failure) (CMS-HCC)     Coronary artery disease     Diabetes mellitus (CMS-HCC)     Hypertension 07/12/2014    Macrocytic anemia     Mixed hyperlipidemia 07/12/2014       Past Surgical History:   Procedure Laterality Date    ABDOMINAL SURGERY      EYE SURGERY      JOINT REPLACEMENT      PR CATH PLACE/CORON ANGIO, IMG SUPER/INTERP,W LEFT HEART VENTRICULOGRAPHY N/A 04/15/2020    Procedure: CATH LEFT HEART CATHETERIZATION W INTERVENTION;  Surgeon: Rosana Hoes, MD;  [facility] C[facility];  Service: Cardiology    PR INSJ/RPLCMT PERM DFB W/TRNSVNS LDS 1/DUAL [facility] N/A 08/04/2020    Procedure: Implant Biventricular ICD System;  Surgeon: Dorcas Carrow, MD;  Location: [facility] EP;  Service: Cardiology

## 2023-08-04 ENCOUNTER — Institutional Professional Consult (permissible substitution)
Admit: 2023-08-04 | Discharge: 2023-08-04 | Payer: MEDICARE | Attending: Nurse Practitioner | Primary: Nurse Practitioner

## 2023-08-04 DIAGNOSIS — I493 Ventricular premature depolarization: Secondary | ICD-10-CM | POA: Diagnosis not present

## 2023-08-04 DIAGNOSIS — I255 Ischemic cardiomyopathy: Secondary | ICD-10-CM | POA: Diagnosis not present

## 2023-08-04 DIAGNOSIS — I5042 Chronic combined systolic (congestive) and diastolic (congestive) heart failure: Secondary | ICD-10-CM | POA: Diagnosis not present

## 2023-08-04 DIAGNOSIS — E782 Mixed hyperlipidemia: Secondary | ICD-10-CM | POA: Diagnosis not present

## 2023-08-04 DIAGNOSIS — I110 Hypertensive heart disease with heart failure: Secondary | ICD-10-CM | POA: Diagnosis not present

## 2023-08-04 DIAGNOSIS — I252 Old myocardial infarction: Secondary | ICD-10-CM | POA: Diagnosis not present

## 2023-08-04 DIAGNOSIS — I2510 Atherosclerotic heart disease of native coronary artery without angina pectoris: Secondary | ICD-10-CM | POA: Diagnosis not present

## 2023-08-04 DIAGNOSIS — Z4502 Encounter for adjustment and management of automatic implantable cardiac defibrillator: Secondary | ICD-10-CM | POA: Diagnosis not present

## 2023-08-04 DIAGNOSIS — E119 Type 2 diabetes mellitus without complications: Secondary | ICD-10-CM | POA: Diagnosis not present

## 2023-08-04 NOTE — Unmapped (Signed)
Your device function is normal today.      [facility] EP Patient Education Gallery    [email or web address]            Living With Atrial Fibrillation Patient Guide             For general questions or concerns, please call the DEVICE CLINIC during normal business hours. If you are having a medical emergency, go to the nearest emergency room or call 911.  For ICDs only  If you receive ONE shock If you receive ONE shock If you receive TWO or MORE shocks   You have no symptoms You have symptoms:    Chest pain/pressure  Shortness of breath  Rapid heart action  Dizziness  Generally don't feel well    Call your heart doctor to discuss the shock and arrange for appropriate follow-up Call 911. Follow-up with a call to your doctor. Call 911 immediately. Follow-up with a call to your heart doctor.           For Device Related Questions please contact the Device Clinic [phone] (Monday-Friday 8:00-4:30p.m)    To schedule or change an appointment with Device Clinic call: [phone]    For technical assistance with home monitors:  Please call your home monitoring company    [employer] [phone]  [employer] [phone]  [employer]  [phone]   [facility] [employer] [place]) [phone]  Future Appointments   Date Time Provider Department Center   09/02/2023 12:00 AM [facility] HF [facility] TRIANGLE [facility]A

## 2023-08-05 DIAGNOSIS — Z4502 Encounter for adjustment and management of automatic implantable cardiac defibrillator: Secondary | ICD-10-CM | POA: Diagnosis not present

## 2023-08-05 NOTE — Unmapped (Signed)
[place] Heart Failure Monitoring Device Report    Patient:  Douglas Beltran  Date:  August 05, 2023    Device:   Medtronic - [place] HF cardiologist:   Dr. Johann Capers EP cardiologist:   [facility]   Other cardiologist:  none     PCP:  Rica Records, MD      30 day Report:  There were no readings that exceeded the thresholds.    Optivol device detected intrathoracic impedance that was at baseline, suggesting optimal fluid status.      Please see full interrogation report under Media tab for this encounter.     I reviewed the device-generated report and agree with findings.      Recommendation:  Continue monthly monitoring.      Hope Budds, AGNP

## 2023-08-10 DIAGNOSIS — E118 Type 2 diabetes mellitus with unspecified complications: Secondary | ICD-10-CM | POA: Diagnosis not present

## 2023-08-10 DIAGNOSIS — N1832 Chronic kidney disease, stage 3b: Secondary | ICD-10-CM | POA: Diagnosis not present

## 2023-08-10 DIAGNOSIS — Z79899 Other long term (current) drug therapy: Secondary | ICD-10-CM | POA: Diagnosis not present

## 2023-08-17 MED ORDER — CARVEDILOL 25 MG TABLET
ORAL_TABLET | Freq: Two times a day (BID) | ORAL | 1 refills | 0 days
Start: 2023-08-17 — End: ?

## 2023-08-18 MED ORDER — CARVEDILOL 25 MG TABLET
ORAL_TABLET | Freq: Two times a day (BID) | ORAL | 1 refills | 90 days | Status: CP
Start: 2023-08-18 — End: ?

## 2023-08-18 NOTE — Unmapped (Signed)
Refill request received for patient.      Medication Requested: coreg  Last Office Visit: 06/01/2023   Next Office Visit: 08/02/2024  Last Prescriber: Hessie Dibble    Nurse refill requirements met? Yes  If not met, why:       Sent to: Pharmacy per protocol  If sent to provider, which provider?:

## 2023-09-01 ENCOUNTER — Encounter
Admit: 2023-09-01 | Discharge: 2023-09-01 | Payer: MEDICARE | Attending: Clinical Cardiac Electrophysiology | Primary: Clinical Cardiac Electrophysiology

## 2023-09-01 DIAGNOSIS — Z95810 Presence of automatic (implantable) cardiac defibrillator: Secondary | ICD-10-CM | POA: Diagnosis not present

## 2023-09-01 DIAGNOSIS — I255 Ischemic cardiomyopathy: Secondary | ICD-10-CM | POA: Diagnosis not present

## 2023-09-02 ENCOUNTER — Institutional Professional Consult (permissible substitution): Admit: 2023-09-02 | Discharge: 2023-09-03 | Payer: MEDICARE

## 2023-09-02 DIAGNOSIS — I5042 Chronic combined systolic (congestive) and diastolic (congestive) heart failure: Secondary | ICD-10-CM | POA: Diagnosis not present

## 2023-09-06 NOTE — Unmapped (Signed)
[place] Heart Failure Monitoring Device Report    Patient:  Douglas Beltran  Date:  September 06, 2023    Device:   Medtronic - [employer] HF cardiologist:   Dr. Johann Capers EP cardiologist:   [facility]   Other cardiologist:  none     PCP:  Rica Records, MD      30 day Report:  There were no readings that exceeded the thresholds.    Optivol device detected intrathoracic impedance that was at baseline, suggesting optimal fluid status.      Please see full interrogation report under Media tab for this encounter.     I reviewed the device-generated report and agree with findings.      Recommendation:  Continue monthly monitoring.      Hope Budds, AGNP

## 2023-09-13 NOTE — Unmapped (Signed)
[facility] Specialty and Home Delivery Pharmacy Refill Coordination Note    Specialty Lite Medication(s) to be Shipped:   Repatha    Other medication(s) to be shipped: No additional medications requested for fill at this time     AMIERE CAWLEY, DOB: Jul 23, 1941  Phone: [phone] (home)       All above HIPAA information was verified with patient.     Was a Nurse, learning disability used for this call? No    Changes to medications: Colleen reports no changes at this time.  Changes to insurance: No      REFERRAL TO PHARMACIST     Referral to the pharmacist: Not needed      [facility]     Shipping address confirmed in Epic.     Delivery Scheduled: Yes, Expected medication delivery date: 09/16/23.     Medication will be delivered via Same Day Courier to the prescription address in Epic [place].    Willette Pa   [facility] Specialty and Home Delivery Pharmacy Specialty Technician

## 2023-09-16 MED FILL — REPATHA SURECLICK 140 MG/ML SUBCUTANEOUS PEN INJECTOR: SUBCUTANEOUS | 56 days supply | Qty: 4 | Fill #4

## 2023-09-21 DIAGNOSIS — E785 Hyperlipidemia, unspecified: Secondary | ICD-10-CM | POA: Diagnosis not present

## 2023-09-21 DIAGNOSIS — Z79899 Other long term (current) drug therapy: Secondary | ICD-10-CM | POA: Diagnosis not present

## 2023-09-21 DIAGNOSIS — I509 Heart failure, unspecified: Secondary | ICD-10-CM | POA: Diagnosis not present

## 2023-09-21 DIAGNOSIS — I2510 Atherosclerotic heart disease of native coronary artery without angina pectoris: Secondary | ICD-10-CM | POA: Diagnosis not present

## 2023-09-21 DIAGNOSIS — Z0000 Encounter for general adult medical examination without abnormal findings: Secondary | ICD-10-CM | POA: Diagnosis not present

## 2023-09-21 DIAGNOSIS — E119 Type 2 diabetes mellitus without complications: Secondary | ICD-10-CM | POA: Diagnosis not present

## 2023-09-21 DIAGNOSIS — I110 Hypertensive heart disease with heart failure: Secondary | ICD-10-CM | POA: Diagnosis not present

## 2023-09-21 DIAGNOSIS — Z125 Encounter for screening for malignant neoplasm of prostate: Secondary | ICD-10-CM | POA: Diagnosis not present

## 2023-10-03 ENCOUNTER — Institutional Professional Consult (permissible substitution): Admit: 2023-10-03 | Discharge: 2023-10-04 | Payer: MEDICARE

## 2023-10-03 DIAGNOSIS — I5042 Chronic combined systolic (congestive) and diastolic (congestive) heart failure: Secondary | ICD-10-CM | POA: Diagnosis not present

## 2023-10-03 NOTE — Unmapped (Signed)
[place] Heart Failure Monitoring Device Report    Patient:  Douglas Beltran  Date:  October 03, 2023    Device:   Medtronic - [place] HF cardiologist:   Dr. Carin Hock   [facility] EP cardiologist:   Dr. Sheran Luz   Other cardiologist:   Dr. Artis Flock      PCP:  Rica Records, MD      30 day Report:  There were no readings that exceeded the thresholds.    Optivol device detected intrathoracic impedance that was at baseline, suggesting optimal fluid status.      Please see full interrogation report under Media tab for this encounter.     I reviewed the device-generated report and agree with findings.      Recommendation:  Continue monthly monitoring.      [name], ANP

## 2023-11-03 NOTE — Unmapped (Signed)
[facility] Specialty and Home Delivery Pharmacy Refill Coordination Note    Specialty Lite Medication(s) to be Shipped:   Repatha    Other medication(s) to be shipped: No additional medications requested for fill at this time     Douglas Beltran, DOB: 1941/02/06  Phone: [phone] (home)       All above HIPAA information was verified with patient.     Was a Nurse, learning disability used for this call? No    Changes to medications: [name] reports no changes at this time.  Changes to insurance: No      REFERRAL TO PHARMACIST     Referral to the pharmacist: Not needed      [facility]     Shipping address confirmed in Epic.     Delivery Scheduled: Yes, Expected medication delivery date: 12/4.     Medication will be delivered via Same Day Courier to the prescription address in Epic [place].    Westley Gambles   [facility] Specialty and Home Delivery Pharmacy Specialty Technician

## 2023-11-04 ENCOUNTER — Institutional Professional Consult (permissible substitution): Admit: 2023-11-04 | Discharge: 2023-11-05 | Payer: MEDICARE

## 2023-11-04 DIAGNOSIS — I5042 Chronic combined systolic (congestive) and diastolic (congestive) heart failure: Secondary | ICD-10-CM | POA: Diagnosis not present

## 2023-11-04 NOTE — Unmapped (Signed)
[place] Heart Failure Monitoring Device Report    Patient:  Douglas Beltran  Date:  November 04, 2023    Device:   Medtronic - [place] HF cardiologist:   Dr. Carin Hock   [facility] EP cardiologist:   Dr. Sheran Luz   Other cardiologist:   Dr. Alanda Slim      PCP:  Rica Records, MD      30 day Report:  There were no readings that exceeded the thresholds.    Optivol device detected intrathoracic impedance that was at baseline, suggesting optimal fluid status.      Please see full interrogation report under Media tab for this encounter.     I reviewed the device-generated report and agree with findings.      Recommendation:  Continue monthly monitoring.      [name], ANP

## 2023-11-16 MED FILL — REPATHA SURECLICK 140 MG/ML SUBCUTANEOUS PEN INJECTOR: SUBCUTANEOUS | 56 days supply | Qty: 4 | Fill #5

## 2023-12-01 ENCOUNTER — Encounter
Admit: 2023-12-01 | Discharge: 2023-12-01 | Payer: MEDICARE | Attending: Clinical Cardiac Electrophysiology | Primary: Clinical Cardiac Electrophysiology

## 2023-12-01 DIAGNOSIS — Z4502 Encounter for adjustment and management of automatic implantable cardiac defibrillator: Secondary | ICD-10-CM | POA: Diagnosis not present

## 2023-12-01 DIAGNOSIS — I255 Ischemic cardiomyopathy: Secondary | ICD-10-CM | POA: Diagnosis not present

## 2023-12-01 DIAGNOSIS — Z95810 Presence of automatic (implantable) cardiac defibrillator: Secondary | ICD-10-CM | POA: Diagnosis not present

## 2023-12-05 ENCOUNTER — Institutional Professional Consult (permissible substitution): Admit: 2023-12-05 | Discharge: 2023-12-06 | Payer: MEDICARE

## 2023-12-05 DIAGNOSIS — I5042 Chronic combined systolic (congestive) and diastolic (congestive) heart failure: Secondary | ICD-10-CM | POA: Diagnosis not present

## 2023-12-05 NOTE — Unmapped (Signed)
[place] Heart Failure Monitoring Device Report    Patient:  Douglas Beltran  Date:  December 05, 2023    Device:   Medtronic - [place] HF cardiologist:   Dr. Johann Capers EP cardiologist:   [facility]   Other cardiologist:  none     PCP:  Rica Records, MD      30 day Report:  There were no readings that exceeded the thresholds.    Optivol device detected intrathoracic impedance that was at baseline, suggesting optimal fluid status.      Please see full interrogation report under Media tab for this encounter.     I reviewed the device-generated report and agree with findings.      Recommendation:  Continue monthly monitoring.      Hope Budds, AGNP

## 2023-12-20 DIAGNOSIS — I5022 Chronic systolic (congestive) heart failure: Principal | ICD-10-CM

## 2023-12-20 MED ORDER — JARDIANCE 10 MG TABLET
ORAL_TABLET | Freq: Every day | ORAL | 11 refills | 0.00 days
Start: 2023-12-20 — End: ?

## 2023-12-20 MED ORDER — ENTRESTO 97 MG-103 MG TABLET
ORAL_TABLET | ORAL | 11 refills | 0.00 days
Start: 2023-12-20 — End: ?

## 2023-12-21 NOTE — Unmapped (Signed)
Refill request received for patient.      Medication Requested: Douglas Beltran  Last Office Visit: 06/01/2023   Next Office Visit: 08/02/2024  Last Prescriber: Hessie Dibble    Nurse refill requirements met? No  If not met, why: [place] Pepper is not on approved list for [facility] to refill       Sent to: Provider for signing  If sent to provider, which provider?: [place]

## 2023-12-22 MED ORDER — ENTRESTO 97 MG-103 MG TABLET
ORAL_TABLET | ORAL | 11 refills | 0.00 days | Status: CP
Start: 2023-12-22 — End: ?

## 2023-12-22 MED ORDER — JARDIANCE 10 MG TABLET
ORAL_TABLET | Freq: Every day | ORAL | 11 refills | 30.00 days | Status: CP
Start: 2023-12-22 — End: ?

## 2023-12-25 NOTE — Unmapped (Signed)
Douglas Beltran requested a refill of their Repatha via IVR/Web. The [facility] Specialty and Home Delivery Pharmacy has scheduled delivery per the patients request via Same Day Courier to be delivered to their prescription address on 01/02/24.

## 2024-01-03 DIAGNOSIS — I214 Non-ST elevation (NSTEMI) myocardial infarction: Principal | ICD-10-CM

## 2024-01-03 MED FILL — REPATHA SURECLICK 140 MG/ML SUBCUTANEOUS PEN INJECTOR: SUBCUTANEOUS | 84 days supply | Qty: 6 | Fill #6

## 2024-01-05 ENCOUNTER — Ambulatory Visit: Admit: 2024-01-05 | Discharge: 2024-01-06 | Payer: MEDICARE

## 2024-01-05 DIAGNOSIS — I5042 Chronic combined systolic (congestive) and diastolic (congestive) heart failure: Secondary | ICD-10-CM | POA: Diagnosis not present

## 2024-01-05 DIAGNOSIS — Z4502 Encounter for adjustment and management of automatic implantable cardiac defibrillator: Secondary | ICD-10-CM | POA: Diagnosis not present

## 2024-01-06 NOTE — Unmapped (Signed)
[place] Heart Failure Monitoring Device Report    Patient:  Douglas Beltran  Date:  January 05, 2024    Device:   Medtronic - [place] HF cardiologist:   Dr. Johann Capers EP cardiologist:   [facility]   Other cardiologist:  none     PCP:  Rica Records, MD      30 day Report:  There were no readings that exceeded the thresholds.    Optivol device detected intrathoracic impedance that was at baseline, suggesting optimal fluid status.      Please see full interrogation report under Media tab for this encounter.     I reviewed the device-generated report and agree with findings.      Recommendation:  Continue monthly monitoring.      Hope Budds, AGNP

## 2024-02-06 ENCOUNTER — Ambulatory Visit: Admit: 2024-02-06 | Discharge: 2024-02-07 | Payer: MEDICARE

## 2024-02-06 DIAGNOSIS — Z4502 Encounter for adjustment and management of automatic implantable cardiac defibrillator: Secondary | ICD-10-CM | POA: Diagnosis not present

## 2024-02-06 DIAGNOSIS — I5042 Chronic combined systolic (congestive) and diastolic (congestive) heart failure: Secondary | ICD-10-CM | POA: Diagnosis not present

## 2024-02-07 NOTE — Unmapped (Signed)
 [place] Heart Failure Monitoring Device Report    Patient:  Douglas Beltran  Date:  February 07, 2024    Device:   Medtronic - [place] HF cardiologist:   Dr. Carin Hock   [facility] EP cardiologist:   Dr. Sheran Luz   Other cardiologist:   Dr. Alanda Slim      PCP:  Rica Records, MD      30 day Report:  There were no readings that exceeded the thresholds.    Optivol device detected intrathoracic impedance that was at baseline, suggesting optimal fluid status.      Please see full interrogation report under Media tab for this encounter.     I reviewed the device-generated report and agree with findings.      Recommendation:  Continue monthly monitoring.      [name], ANP

## 2024-02-09 MED ORDER — CARVEDILOL 25 MG TABLET
ORAL_TABLET | Freq: Two times a day (BID) | ORAL | 1 refills | 90.00 days | Status: CP
Start: 2024-02-09 — End: ?

## 2024-02-09 NOTE — Unmapped (Signed)
 Refill request received for patient.      Medication Requested: Carvedilol  Last Office Visit: 06/01/2023   Next Office Visit: 06/06/2024  Last Prescriber: Hessie Dibble    Nurse refill requirements met? Yes  If not met, why:     Sent to: Pharmacy per protocol  If sent to provider, which provider?:

## 2024-03-01 ENCOUNTER — Encounter
Admit: 2024-03-01 | Discharge: 2024-03-01 | Payer: MEDICARE | Attending: Clinical Cardiac Electrophysiology | Primary: Clinical Cardiac Electrophysiology

## 2024-03-01 DIAGNOSIS — Z4502 Encounter for adjustment and management of automatic implantable cardiac defibrillator: Secondary | ICD-10-CM | POA: Diagnosis not present

## 2024-03-01 DIAGNOSIS — Z95810 Presence of automatic (implantable) cardiac defibrillator: Secondary | ICD-10-CM | POA: Diagnosis not present

## 2024-03-01 DIAGNOSIS — I255 Ischemic cardiomyopathy: Secondary | ICD-10-CM | POA: Diagnosis not present

## 2024-03-08 ENCOUNTER — Ambulatory Visit: Admit: 2024-03-08 | Discharge: 2024-03-08 | Payer: MEDICARE

## 2024-03-08 DIAGNOSIS — I5042 Chronic combined systolic (congestive) and diastolic (congestive) heart failure: Secondary | ICD-10-CM | POA: Diagnosis not present

## 2024-03-08 DIAGNOSIS — Z4502 Encounter for adjustment and management of automatic implantable cardiac defibrillator: Secondary | ICD-10-CM | POA: Diagnosis not present

## 2024-03-09 NOTE — Unmapped (Signed)
 [place] Heart Failure Monitoring Device Report    Patient:  Douglas Beltran  Date:  March 09, 2024    Device:   Medtronic - [place] HF cardiologist:   Dr. Carin Hock   [facility] EP cardiologist:   Dr. Sheran Luz   Other cardiologist:   Dr. Alanda Slim      PCP:  Rica Records, MD      30 day Report:  There were no readings that exceeded the thresholds.    Optivol device detected intrathoracic impedance that was at baseline, suggesting optimal fluid status.      Please see full interrogation report under Media tab for this encounter.     I reviewed the device-generated report and agree with findings.      Recommendation:  Continue monthly monitoring.      [name], ANP

## 2024-03-20 DIAGNOSIS — I509 Heart failure, unspecified: Secondary | ICD-10-CM | POA: Diagnosis not present

## 2024-03-20 DIAGNOSIS — R0602 Shortness of breath: Secondary | ICD-10-CM | POA: Diagnosis not present

## 2024-03-20 DIAGNOSIS — Z125 Encounter for screening for malignant neoplasm of prostate: Secondary | ICD-10-CM | POA: Diagnosis not present

## 2024-03-20 DIAGNOSIS — Z0000 Encounter for general adult medical examination without abnormal findings: Secondary | ICD-10-CM | POA: Diagnosis not present

## 2024-03-20 DIAGNOSIS — E1122 Type 2 diabetes mellitus with diabetic chronic kidney disease: Secondary | ICD-10-CM | POA: Diagnosis not present

## 2024-03-20 DIAGNOSIS — I130 Hypertensive heart and chronic kidney disease with heart failure and stage 1 through stage 4 chronic kidney disease, or unspecified chronic kidney disease: Secondary | ICD-10-CM | POA: Diagnosis not present

## 2024-03-20 DIAGNOSIS — Z79899 Other long term (current) drug therapy: Secondary | ICD-10-CM | POA: Diagnosis not present

## 2024-03-20 DIAGNOSIS — E785 Hyperlipidemia, unspecified: Secondary | ICD-10-CM | POA: Diagnosis not present

## 2024-03-20 DIAGNOSIS — R270 Ataxia, unspecified: Secondary | ICD-10-CM | POA: Diagnosis not present

## 2024-03-20 DIAGNOSIS — N189 Chronic kidney disease, unspecified: Secondary | ICD-10-CM | POA: Diagnosis not present

## 2024-03-20 DIAGNOSIS — I214 Non-ST elevation (NSTEMI) myocardial infarction: Principal | ICD-10-CM

## 2024-03-20 MED ORDER — REPATHA SURECLICK 140 MG/ML SUBCUTANEOUS PEN INJECTOR
SUBCUTANEOUS | 11 refills | 56.00 days
Start: 2024-03-20 — End: ?

## 2024-03-20 NOTE — Unmapped (Signed)
 Refill request received for patient.      Medication Requested: Repatha  Last Office Visit: 06/01/2023   Next Office Visit: 06/06/2024  Last Prescriber: Dr Barrie Lie    Nurse refill requirements met? No  If not met, why: Needs new prescription    Sent to: Provider for signing  If sent to provider, which provider?: Dr Barrie Lie

## 2024-03-26 ENCOUNTER — Other Ambulatory Visit: Payer: Self-pay | Admitting: Internal Medicine

## 2024-03-26 DIAGNOSIS — R270 Ataxia, unspecified: Secondary | ICD-10-CM

## 2024-03-27 DIAGNOSIS — Z1211 Encounter for screening for malignant neoplasm of colon: Secondary | ICD-10-CM | POA: Diagnosis not present

## 2024-03-27 MED ORDER — REPATHA SURECLICK 140 MG/ML SUBCUTANEOUS PEN INJECTOR
SUBCUTANEOUS | 11 refills | 56.00 days | Status: CP
Start: 2024-03-27 — End: ?
  Filled 2024-03-28: qty 6, 84d supply, fill #0

## 2024-04-05 DIAGNOSIS — R270 Ataxia, unspecified: Principal | ICD-10-CM

## 2024-04-09 ENCOUNTER — Ambulatory Visit: Admit: 2024-04-09 | Discharge: 2024-04-10 | Payer: Medicare (Managed Care)

## 2024-04-09 DIAGNOSIS — I5042 Chronic combined systolic (congestive) and diastolic (congestive) heart failure: Secondary | ICD-10-CM | POA: Diagnosis not present

## 2024-04-09 DIAGNOSIS — Z4502 Encounter for adjustment and management of automatic implantable cardiac defibrillator: Secondary | ICD-10-CM | POA: Diagnosis not present

## 2024-04-10 NOTE — Unmapped (Signed)
 [place] Heart Failure Monitoring Device Report    Patient:  Douglas Beltran  Date:  April 10, 2024    Device:   Medtronic - [place] HF cardiologist:   Dr. Bertis Brochure EP cardiologist:   [facility]   Other cardiologist:  none     PCP:  Gean Keels, MD      30 day Report:  There were no readings that exceeded the thresholds.    Optivol device detected intrathoracic impedance that was at baseline, suggesting optimal fluid status.      Please see full interrogation report under Media tab for this encounter.     I reviewed the device-generated report and agree with findings.      Recommendation:  Continue monthly monitoring.      Thais Fill, AGNP

## 2024-04-11 DIAGNOSIS — E538 Deficiency of other specified B group vitamins: Secondary | ICD-10-CM | POA: Diagnosis not present

## 2024-04-18 DIAGNOSIS — E538 Deficiency of other specified B group vitamins: Secondary | ICD-10-CM | POA: Diagnosis not present

## 2024-04-19 DIAGNOSIS — Z95810 Presence of automatic (implantable) cardiac defibrillator: Principal | ICD-10-CM

## 2024-04-19 MED ORDER — ATORVASTATIN 80 MG TABLET
ORAL_TABLET | ORAL | 3 refills | 0.00000 days
Start: 2024-04-19 — End: ?

## 2024-04-20 MED ORDER — ATORVASTATIN 80 MG TABLET
ORAL_TABLET | Freq: Every day | ORAL | 3 refills | 90.00000 days | Status: CP
Start: 2024-04-20 — End: ?

## 2024-04-25 DIAGNOSIS — E538 Deficiency of other specified B group vitamins: Secondary | ICD-10-CM | POA: Diagnosis not present

## 2024-04-27 ENCOUNTER — Ambulatory Visit: Admit: 2024-04-27 | Discharge: 2024-04-28 | Payer: Medicare (Managed Care)

## 2024-04-27 NOTE — Unmapped (Signed)
 [employer]  at [facility]  Ep Cardiac Device Service  [address]   Tel: [phone]  Fax: [phone]  Ambulatory e-Consult to Cardiac Electrophysiology   Visit Date:  04/27/2024    Order:     There are no diagnoses linked to this encounter.  MRI Consult with Implanted CIED  Patient is approved for MRI.  Patient has a MR-Conditional device  Implants          ICD          Defib Cobalt Xt Hf Myer Artis Crt-D - [phone] s - Implanted        Inventory item: Defib Cobalt Xt Hf Df1 Quad Mr I Crt-D Model/Cat number: [id number]    Serial number: [id number] Manufacturer: [employer] CRDM    Lot number: N/A        As of 08/04/2020       Status: Implanted                           Lead          Lead Icd Sgl Coil Tripolar Active Douglas Beltran - [id number] - Implanted        Inventory item: Lead Icd Sgl Coil Tripolar Act [name] Model/Cat number: [id number]    Serial number: [id number] Manufacturer: [employer] [place]     Lot number: N/A Device identifier: [id number]    Device identifier type: GS1        GUDID Information       Request status Successful        Brand name: Sprint Quattro Secure S MRI??? SureScan??? Version/Model: 6606T01    Company name: [employer]IC[employer]. MRI safety info as of 08/04/20: MR Conditional    Contains dry or latex rubber: No      GMDN P.T. name: Endocardial defibrillation lead                As of 08/04/2020       Status: Implanted                        Lead Capsurefix Bipolar Atrial/Ventricular 52cm - [id number] - Implanted        Inventory item: Lead Capsurefix Bipolar Atrial /Ventricular 52cm O-36 Model/Cat number: [id number]    Serial number: [id number] Manufacturer: [employer] [place]     Lot number: N/A Device identifier: [id number]    Device identifier type: GS1        GUDID Information       Request status Successful        Brand name: CapSureFix Novus MRI??? SureScan?? Version/Model: 5076-52    Company name: [employer]. MRI safety info as of 08/04/20: MR Conditional    Contains dry or latex rubber: No      GMDN P.T. name: Endocardial pacing lead                As of 08/04/2020       Status: Implanted                        Lead [id number] Douglas Beltran [id number] v - Implanted        Inventory item: Lead 093818 Douglas Beltran [place] Model/Cat number: [id number]    Serial number: [id number] Manufacturer: [employer] CRDM    Lot number: N/A Device identifier: [id number]    Device identifier type: GS1  GUDID Information       Request status Successful        Brand name: Attain Performa??? Straight MRI SureScan??? Version/Model: [id number]    Company name: [employer], [employer]. MRI safety info as of 08/04/20: MR Conditional    Contains dry or latex rubber: No      GMDN P.T. name: Endocardial pacing lead                As of 08/04/2020       Status: Implanted                           Stent          Stent Xience 3.5x38 Sierra Desrx - [id number] - Implanted  Coronary Artery Left Main      Inventory item: Stent Xience 3.5x38 [facility] Rx Model/Cat number: [id number]    Manufacturer: [employer] (GUIDANT) Lot number: [id number]    Size: 3.5x38 Device identifier: [id number]    Device identifier type: GS1        GUDID Information       Request status Successful        Brand name: Barrett Lick Version/Model: [id number]    Company name: [employer] MRI safety info as of 04/15/20: MR Conditional    Contains dry or latex rubber: No      GMDN P.T. name: Drug-eluting coronary artery stent, non-bioabsorbable-polymer-coated                As of 04/15/2020       Status: Implanted                        Stent Xience 4.0x38 Sierra Desrx - [id number] - Implanted  Coronary Artery Left Main      Inventory item: Stent Xience 4.0x38 [facility] Rx Model/Cat number: [id number]    Serial number: [id number] Manufacturer: [employer] (GUIDANT)    Lot number: [id number] Device identifier: [id number]    Device identifier type: GS1        GUDID Information       Request status Successful        Brand name: Barrett Lick Version/Model: [id number]    Company name: [employer] MRI safety info as of 04/15/20: MR Conditional    Contains dry or latex rubber: No      GMDN P.T. name: Drug-eluting coronary artery stent, non-bioabsorbable-polymer-coated                As of 04/15/2020       Status: Implanted                        Stent Xience 3.5x38 Sierra Desrx - [id number] - Implanted  Coronary Artery Right Coronary      Inventory item: Stent Xience 3.5x38 [facility] Rx Model/Cat number: [id number]    Serial number: [id number] Manufacturer: [employer] (GUIDANT)    Lot number: [id number] Device identifier: [id number]    Device identifier type: GS1        GUDID Information       Request status Successful        Brand name: Barrett Lick Version/Model: [id number]    Company name: [employer] MRI safety info as of 04/15/20: MR Conditional    Contains dry or latex rubber: No      GMDN P.T. name: Drug-eluting coronary artery stent, non-bioabsorbable-polymer-coated  As of 04/15/2020       Status: Implanted                                  Prescreening Requirements  *The patient has no implanted lead extenders, lead adaptors, or abandoned leads  *The patient has no broken leads or leads with intermittent electrical contact, as confirmed by lead impedance history >200 ohms and <1500 ohms   *Implanted for more than 6 weeks-unless EP Attending approval   *Device is operating within the projected service life-Cannot be done if EOS, ERI, near ERI without Ep Attending approval   For patients whose device will be programmed to an asynchronous pacing mode when the MRI SureScan mode is programmed to On, no diaphragmatic stimulation is present when the paced leads have a pacing output of 5.0 V and a pulse width of 1.0 ms.  *Pacing capture thresholds <= 2.0 V at 0.4 ms-if dependent (pacing left on)        MR CONDITIONAL STATUS AND MANAGEMENT    MR CONDITIONAL SYSTEM    (Including Generator and all leads)  Risks and Potential Complications discussed with patient and patient consents to procedure        Device will be programmed under the direction of the Ep Attending by the Ep Device RN

## 2024-05-02 DIAGNOSIS — E538 Deficiency of other specified B group vitamins: Secondary | ICD-10-CM | POA: Diagnosis not present

## 2024-05-10 ENCOUNTER — Ambulatory Visit: Admit: 2024-05-10 | Discharge: 2024-05-11 | Payer: Medicare (Managed Care)

## 2024-05-31 ENCOUNTER — Encounter
Admit: 2024-05-31 | Discharge: 2024-05-31 | Payer: Medicare (Managed Care) | Attending: Clinical Cardiac Electrophysiology | Primary: Clinical Cardiac Electrophysiology

## 2024-05-31 DIAGNOSIS — Z95810 Presence of automatic (implantable) cardiac defibrillator: Secondary | ICD-10-CM | POA: Diagnosis not present

## 2024-05-31 DIAGNOSIS — Z4502 Encounter for adjustment and management of automatic implantable cardiac defibrillator: Secondary | ICD-10-CM | POA: Diagnosis not present

## 2024-05-31 DIAGNOSIS — I255 Ischemic cardiomyopathy: Secondary | ICD-10-CM | POA: Diagnosis not present

## 2024-06-04 DIAGNOSIS — E538 Deficiency of other specified B group vitamins: Secondary | ICD-10-CM | POA: Diagnosis not present

## 2024-06-11 ENCOUNTER — Ambulatory Visit: Admit: 2024-06-11 | Discharge: 2024-06-12 | Payer: Medicare (Managed Care)

## 2024-06-11 DIAGNOSIS — E119 Type 2 diabetes mellitus without complications: Secondary | ICD-10-CM | POA: Diagnosis not present

## 2024-06-11 DIAGNOSIS — Z4502 Encounter for adjustment and management of automatic implantable cardiac defibrillator: Secondary | ICD-10-CM | POA: Diagnosis not present

## 2024-06-11 DIAGNOSIS — I5042 Chronic combined systolic (congestive) and diastolic (congestive) heart failure: Secondary | ICD-10-CM | POA: Diagnosis not present

## 2024-06-11 DIAGNOSIS — H35351 Cystoid macular degeneration, right eye: Secondary | ICD-10-CM | POA: Diagnosis not present

## 2024-06-11 DIAGNOSIS — Z961 Presence of intraocular lens: Secondary | ICD-10-CM | POA: Diagnosis not present

## 2024-06-11 NOTE — Unmapped (Signed)
  Heart Failure Monitoring Device Report    Patient:  Douglas Beltran  Date:  June 11, 2024    Device:   Medtronic - [place] HF cardiologist:   Dr. Ilsa Court   [facility] EP cardiologist:   Dr. Shirlyn Adjutant   Other cardiologist:  Dr. Adina Chester     PCP:  Auston Reyes Lenis, MD      30 day Report:  There were no readings that exceeded the thresholds.    Optivol device detected intrathoracic impedance that was at baseline, suggesting optimal fluid status.      Please see full interrogation report under Media tab for this encounter.     I reviewed the device-generated report and agree with findings.      Recommendation:  Continue monthly monitoring.      Christophor Eick B Bernadette Armijo, ANP

## 2024-06-14 MED FILL — REPATHA SURECLICK 140 MG/ML SUBCUTANEOUS PEN INJECTOR: SUBCUTANEOUS | 84 days supply | Qty: 6 | Fill #1

## 2024-06-26 DIAGNOSIS — E538 Deficiency of other specified B group vitamins: Secondary | ICD-10-CM | POA: Diagnosis not present

## 2024-06-26 DIAGNOSIS — I255 Ischemic cardiomyopathy: Secondary | ICD-10-CM | POA: Diagnosis not present

## 2024-06-26 DIAGNOSIS — Z79899 Other long term (current) drug therapy: Secondary | ICD-10-CM | POA: Diagnosis not present

## 2024-06-26 DIAGNOSIS — I5022 Chronic systolic (congestive) heart failure: Secondary | ICD-10-CM | POA: Diagnosis not present

## 2024-06-26 DIAGNOSIS — I130 Hypertensive heart and chronic kidney disease with heart failure and stage 1 through stage 4 chronic kidney disease, or unspecified chronic kidney disease: Secondary | ICD-10-CM | POA: Diagnosis not present

## 2024-06-26 DIAGNOSIS — N1832 Chronic kidney disease, stage 3b: Secondary | ICD-10-CM | POA: Diagnosis not present

## 2024-06-26 DIAGNOSIS — E1122 Type 2 diabetes mellitus with diabetic chronic kidney disease: Secondary | ICD-10-CM | POA: Diagnosis not present

## 2024-06-26 DIAGNOSIS — R42 Dizziness and giddiness: Secondary | ICD-10-CM | POA: Diagnosis not present

## 2024-06-26 DIAGNOSIS — E782 Mixed hyperlipidemia: Secondary | ICD-10-CM | POA: Diagnosis not present

## 2024-06-30 NOTE — Progress Notes (Deleted)
 Cardiology Office Note  Date:  06/30/2024   ID:  Luna, Daniel 12-09-1941, MRN [id number]  PCP:  Auston Reyes BIRCH, MD   No chief complaint on file.   HPI:  Mr. Daniel Luna is a 83 year old gentleman with past medical history of smoking who stopped >27 years ago,  coronary artery disease with stent 3 placed in 2013 at [facility] to the proximal to mid LAD, mid RCA and distal RCA,  Ischemic cardiomyopathy Chronic systolic CHF Essential hypertension ICD followed at [facility], Medtronic - Optivol  Echocardiogram :ejection fraction 30-35% in 2016  Echo July 2023 EF 25% (at [facility]) Who presents by referral from Dr. Auston for chronic systolic CHF  Last seen by myself in September 2020  *Cardiac catheterization April 2021 Severe multivessel coronary artery disease, including 95% distal LMCA stenosis, 70% mid LAD focal in-stent restenosis, 99% ostial LCx stenosis, and 70% mid RCA disease. Moderately to severely elevated left heart filling pressures. Moderately elevated right heart and pulmonary artery pressures. Echo April 11, 2020 ejection fraction 20 to 25% Rotational atherectomy performed, s/p PCI to proximal LAD and mid RCA in 2021     PMH:   has a past medical history of Arthritis, CHF (congestive heart failure) (HCC), Coronary artery disease, Cyst of brain (1983), Diabetes mellitus without complication (HCC), Dysrhythmia, Heart failure (HCC), Hyperlipidemia, Hypertension, Ischemic cardiomyopathy, Macular degeneration, and Shortness of breath dyspnea.  PSH:    Past Surgical History:  Procedure Laterality Date   APPENDECTOMY  1997   CARDIAC CATHETERIZATION     COLONOSCOPY  2005   4 polyps   crainectomy     crainoplasty     CYST EXCISION  08-06-13   back   cyst removal brain  1983   benign dermatoid cyst   EYE SURGERY Bilateral 2008, 2012   RIGHT/LEFT HEART CATH AND CORONARY ANGIOGRAPHY N/A 04/08/2020   Procedure: RIGHT/LEFT HEART CATH AND CORONARY ANGIOGRAPHY;  Surgeon: Mady Bruckner, MD;  Location: [facility] INVASIVE CV LAB;  Service: Cardiovascular;  Laterality: N/A;   stents  2013   3 placed   TOTAL KNEE ARTHROPLASTY Right 05/21/2015   Procedure: TOTAL KNEE ARTHROPLASTY;  Surgeon: Lynwood SHAUNNA Hue, MD;  Location: [facility] ORS;  Service: Orthopedics;  Laterality: Right;   TOTAL KNEE ARTHROPLASTY Left 09/21/2016   Procedure: TOTAL KNEE ARTHROPLASTY;  Surgeon: Ozell Flake, MD;  Location: [facility] ORS;  Service: Orthopedics;  Laterality: Left;   TOTAL KNEE REVISION Left 09/06/2017   Procedure: LEFT KNEE REVISION WITH POLY EXCHANGE, OPEN SYNOVECTOMY;  Surgeon: Flake Ozell, MD;  Location: [facility] ORS;  Service: Orthopedics;  Laterality: Left;    Current Outpatient Medications  Medication Sig Dispense Refill   ACCU-CHEK GUIDE test strip      Accu-Chek Softclix Lancets lancets 2 (two) times daily. as directed     acetaminophen  (TYLENOL ) 325 MG tablet Take 2 tablets (650 mg total) by mouth every 4 (four) hours as needed for headache or mild pain.     aspirin  EC 81 MG EC tablet Take 1 tablet (81 mg total) by mouth daily.     atorvastatin  (LIPITOR ) 80 MG tablet Take 1 tablet (80 mg total) by mouth daily.     carvedilol  (COREG ) 25 MG tablet Take 25 mg by mouth 2 (two) times daily.     Cysteamine Bitartrate (PROCYSBI) 300 MG PACK Use one strip to test blood sugars twice daily     ENTRESTO 24-26 MG Take 1 tablet by mouth 2 (two) times daily.  insulin  aspart (NOVOLOG ) 100 UNIT/ML injection Inject 0-5 Units into the skin at bedtime. (Patient not taking: Reported on 04/25/2020) 10 mL 11   JARDIANCE  10 MG TABS tablet Take 10 mg by mouth daily.     metFORMIN  (GLUCOPHAGE ) 1000 MG tablet Take 1,000 mg by mouth 2 (two) times daily.     NEOMYCIN -POLYMYXIN-HYDROCORTISONE (CORTISPORIN) 1 % SOLN OTIC solution Apply 1-2 drops to toe BID after soaking 10 mL 1   sertraline  (ZOLOFT ) 50 MG tablet Take 50 mg by mouth daily.     spironolactone  (ALDACTONE ) 25 MG tablet Take 25 mg by mouth every other  day.     No current facility-administered medications for this visit.     Allergies:   Iodine and Iodinated contrast media   Social History:  The patient  reports that he quit smoking about 35 years ago. His smoking use included cigarettes. He started smoking about 62 years ago. He has a 54 pack-year smoking history. He has never used smokeless tobacco. He reports current alcohol use. He reports that he does not use drugs.   Family History:   family history includes Throat cancer in his mother.    Review of Systems: ROS   PHYSICAL EXAM: VS:  There were no vitals taken for this visit. , BMI There is no height or weight on file to calculate BMI. GEN: Well nourished, well developed, in no acute distress HEENT: normal Neck: no JVD, carotid bruits, or masses Cardiac: RRR; no murmurs, rubs, or gallops,no edema  Respiratory:  clear to auscultation bilaterally, normal work of breathing GI: soft, nontender, nondistended, + BS MS: no deformity or atrophy Skin: warm and dry, no rash Neuro:  Strength and sensation are intact Psych: euthymic mood, full affect    Recent Labs: No results found for requested labs within last 365 days.    Lipid Panel Lab Results  Component Value Date   CHOL 127 04/06/2020   HDL 29 (L) 04/06/2020   LDLCALC 81 04/06/2020   TRIG 83 04/06/2020      Wt Readings from Last 3 Encounters:  05/27/21 193 lb (87.5 kg)  04/28/20 213 lb 8 oz (96.8 kg)  04/10/20 221 lb 6.4 oz (100.4 kg)       ASSESSMENT AND PLAN:  Problem List Items Addressed This Visit   None    Disposition:   F/U  12 months   Total encounter time more than 30 minutes  Greater than 50% was spent in counseling and coordination of care with the patient    Signed, Velinda Lunger, M.D., Ph.D. [facility] [place], [place] [phone]

## 2024-07-02 ENCOUNTER — Other Ambulatory Visit: Payer: Self-pay | Admitting: Internal Medicine

## 2024-07-02 ENCOUNTER — Ambulatory Visit: Admitting: Cardiovascular Disease

## 2024-07-02 DIAGNOSIS — N1832 Chronic kidney disease, stage 3b: Secondary | ICD-10-CM

## 2024-07-02 DIAGNOSIS — E119 Type 2 diabetes mellitus without complications: Secondary | ICD-10-CM

## 2024-07-02 DIAGNOSIS — I5022 Chronic systolic (congestive) heart failure: Secondary | ICD-10-CM

## 2024-07-02 DIAGNOSIS — I10 Essential (primary) hypertension: Secondary | ICD-10-CM

## 2024-07-02 DIAGNOSIS — I255 Ischemic cardiomyopathy: Secondary | ICD-10-CM

## 2024-07-02 DIAGNOSIS — I5042 Chronic combined systolic (congestive) and diastolic (congestive) heart failure: Secondary | ICD-10-CM

## 2024-07-02 DIAGNOSIS — Z955 Presence of coronary angioplasty implant and graft: Secondary | ICD-10-CM

## 2024-07-02 DIAGNOSIS — R42 Dizziness and giddiness: Secondary | ICD-10-CM

## 2024-07-02 DIAGNOSIS — I25119 Atherosclerotic heart disease of native coronary artery with unspecified angina pectoris: Secondary | ICD-10-CM

## 2024-07-05 ENCOUNTER — Ambulatory Visit
Admission: RE | Admit: 2024-07-05 | Discharge: 2024-07-05 | Disposition: A | Discharge status: Home / Self Care | Source: Ambulatory Visit | Attending: Internal Medicine | Admitting: Internal Medicine

## 2024-07-05 DIAGNOSIS — R9082 White matter disease, unspecified: Secondary | ICD-10-CM | POA: Diagnosis not present

## 2024-07-05 DIAGNOSIS — R42 Dizziness and giddiness: Secondary | ICD-10-CM | POA: Insufficient documentation

## 2024-07-05 DIAGNOSIS — I10 Essential (primary) hypertension: Secondary | ICD-10-CM | POA: Insufficient documentation

## 2024-07-05 DIAGNOSIS — E538 Deficiency of other specified B group vitamins: Secondary | ICD-10-CM | POA: Diagnosis not present

## 2024-07-05 DIAGNOSIS — I2510 Atherosclerotic heart disease of native coronary artery without angina pectoris: Principal | ICD-10-CM

## 2024-07-05 DIAGNOSIS — I5022 Chronic systolic (congestive) heart failure: Principal | ICD-10-CM

## 2024-07-05 DIAGNOSIS — I214 Non-ST elevation (NSTEMI) myocardial infarction: Principal | ICD-10-CM

## 2024-07-05 NOTE — Unmapped (Signed)
 Called and stated that his PCP is requesting that he have an Echocardiogram.  He has an appointment on September 12, 2024 and asks if he can have it done at his appointment.  Requesting order from Dr. Barnet.

## 2024-07-12 ENCOUNTER — Ambulatory Visit: Admit: 2024-07-12 | Discharge: 2024-07-13 | Payer: Medicare (Managed Care)

## 2024-07-12 DIAGNOSIS — I5042 Chronic combined systolic (congestive) and diastolic (congestive) heart failure: Secondary | ICD-10-CM | POA: Diagnosis not present

## 2024-07-12 DIAGNOSIS — Z4502 Encounter for adjustment and management of automatic implantable cardiac defibrillator: Secondary | ICD-10-CM | POA: Diagnosis not present

## 2024-07-12 NOTE — Unmapped (Signed)
 [place] Heart Failure Monitoring Device Report    Patient:  Douglas Beltran  Date:  July 12, 2024    Device:   Medtronic - [place] HF cardiologist:   Dr. Ilsa Court   [facility] EP cardiologist:   Dr. Shirlyn Adjutant   Other cardiologist:  Dr. Adina Chester     PCP:  Auston Reyes Lenis, MD      30 day Report:  There were no readings that exceeded the thresholds.    Optivol device detected intrathoracic impedance that was at baseline, suggesting optimal fluid status.      Please see full interrogation report under Media tab for this encounter.     I reviewed the device-generated report and agree with findings.      Recommendation:  Continue monthly monitoring.      Nanetta Wiegman B Josefita Weissmann, ANP

## 2024-08-01 ENCOUNTER — Ambulatory Visit: Admit: 2024-08-01 | Discharge: 2024-08-01 | Payer: Medicare (Managed Care)

## 2024-08-01 ENCOUNTER — Inpatient Hospital Stay: Admit: 2024-08-01 | Discharge: 2024-08-01 | Payer: Medicare (Managed Care)

## 2024-08-01 DIAGNOSIS — R270 Ataxia, unspecified: Secondary | ICD-10-CM | POA: Diagnosis not present

## 2024-08-01 DIAGNOSIS — G319 Degenerative disease of nervous system, unspecified: Secondary | ICD-10-CM | POA: Diagnosis not present

## 2024-08-01 DIAGNOSIS — Z01818 Encounter for other preprocedural examination: Secondary | ICD-10-CM | POA: Diagnosis not present

## 2024-08-03 NOTE — Unmapped (Signed)
 MRI with Cardiac Implanted Electronic Device  Programming before and after MRI     TESTING:       Sensing (mV):    RA PRE: 0.6 POST: 1.3    RV PRE:  10.8 POST: 2.6      Capture Threshold (V@_ms ):    RA PRE: 0.50 V @ 0.40 ms POST: 0.75 V @ 0.40 ms  RV PRE: 0.625 V @ 0.40 ms POST: 0.75 V @ 0.40 ms  LV PRE: 1.125 V @ 0.40 ms POST: 0.75 V @ 0.40 ms           Impedance (between 200-1500ohms):     RA PRE:  608 POST:  608  RV PRE:  399 POST:  380  LV PRE: 494 POST: 532      Shock PRE: 51 POST:  51         Programming Changes During MRI  Pacemaker Dependent:  NO  CIED programmed to OVO/ODO or VVI/DDI and ICD will be deactivated if applicable  (If programming VOO/DOO and there is an underlying rhythm, the pacing rate will be programmed faster than the underlying rate to avoid competitive pacing)  PACEMAKER-IF NON-CONDITIONAL-BATTERY TEST DEACTIVATED IF APPLICABLE  ICD-Medtronic & [employer] may still tone when exposed to the magnet even though ICD is deactivated    SETTINGS RESTORED TO PREVIOUS POST SCAN    Follow-up advised in 3-6 months after MRI unless earlier follow-up (within a week) is indicated for the following:  Any capture threshold increase >1.0 V, sensing drop >50%, pacing impedance change >50 ohms, or shock impedance change >5 ohms    See PDF in media tab    Questions, please call the Ep Device Rn via Vocera or dial ext [phone]  Ep Device clinic [phone]

## 2024-08-05 NOTE — Unmapped (Addendum)
 Referring Provider: Pcp, None Per Patient  [address] [place],  [place] [address]   Primary Provider: Auston Reyes Lenis, MD  [address] [facility] [place] [address]   Other Providers: Dr. Barnet, Dr. Lorean [facility] EP Follow Up Note    Reason for Visit:  Douglas Beltran is a 83 y.o. male being seen for routine visit and continued care of BiV ICD.    Assessment & Plan:    1. Ischemic Cardiomyopathy, HFrEF s/p Medtronic CRT-D - DOE  Device checked by device nurse  I have reviewed and agree with the findings  Normal device function  A pace - 17.2%  V pace - 86.3%  Events - No events  Battery - 4.2 years  Underlying Rhythm - Sinus bradycardia   Device Dependent - No  Continue remotes      Patient previously had a dip in BiV pacing in late May/early June 2024. This had been slowly recovering and was suspected to be driven by PVCs.  Patient recently underwent an MRI on 08/01/2024.  Interrogation today shows a dip in BiV for the period between 08/01/2024 to 08/06/2024 to 86.3%, down from 91.2% during the last period (08/04/2023 - 08/01/2024).  Patient is complaining of shortness of breath today and has a proBNP of 1417 indicating that he is volume overloaded.. X-ray on 7/15 at [facility] showed lead  position is unchanged however mild basilar predominant pulmonary edema was present, x-ray further noted a tangle of leads cranial to the generator.   -Continue carvedilol  25 mg twice daily   -Continue sacubitril /valsartan  97-103 mg   -Continue empagliflozin  10 mg daily   -proBNP = 1417  -Begin furosemide  20 mg for 5 days; plan to follow-up with diuresis clinic.  -Plan for remote interrogation in 2 weeks to check BiV pacing percentage    2. Coronary Artery Disease  Diagnosed with multivessel disease in 2013 with subsequent PCI.   NSTEMI with PCI to proximal LAD and mid RCA in May 2021.   -Continue aspirin 81 mg  -Continue atorvastatin  80 mg   -Continue Repatha  140 mg every 14 days    3.  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     -Continue carvedilol  as above    ECG: Independently reviewed EKG showing Atrially sensed, venticular paced, 59 bpm.  See official ECG report.    Follow-up:  Return in about 1 year (around 08/06/2025).    History of Present Illness:  Douglas Beltran is a 83 y.o. male with a past medical history of severe three-vessel CAD, type 2 diabetes, HLD, mild COPD, and MDD is being seen in follow-up s/p BiV-ICD.    Interval History: Patient endorses DOE; daughter reports that patient is SOB quite a bit--walked on Saturday (not even a quarter of a mile). Patient endorses occasional lightheadedness, particularly with getting up from seated position. Patient reports occasional central chest pain that lasts a minute. Patient characterizes the pain as dull. Patient reports that he doesn't think he has been taking his aspirin.     Cardiovascular History & Procedures:    Cath / PCI:  04/15/20 Left heart catheterization  PCI was performed of the 99% heavily calcificed Left Main and LAD. Rotational atherectomy was performed using a 1.75 mm burr. PCI was performed with a 4.0x38 Xience (LM-LAD).   PCI was performed of the 90% heavily  calcificed RCA. Rotational atherectomy was performed using a 1.75 mm burr. PCI was performed with a 4.0x38 and 3.5x30 with rota (post to 4.5).   Excellent angiographic result with TIMI 3 flow    CV Surgery:  No prior    EP Procedures and Devices:  08/04/2020 Medtronic BiV ICD implant     Non-Invasive Evaluation(s):    Echo:  07/12/2022 TTE  1. The left ventricle is normal in size with normal wall thickness.  2. The left ventricular systolic function is moderately decreased, LVEF is visually estimated at 25%. Overall it is unchanged from prior echoes.  3. There is decreased contractile function involving the mid anterolateral and basal anterolateral segment(s).  4. There is grade I diastolic dysfunction (impaired relaxation).  5. Aortic valve trileaflet with mildly thickened leaflets with normal excursion  6. The left atrium is mildly dilated in size.  7. The right ventricle is normal in size, with normal systolic function.  8. The right atrium is mildly dilated  in size.    04/03/2021 TTE  1. The left ventricle is normal in size with normal wall thickness.  2. LV systolic function severely decreased, LVEF estimated at 25%.  3. There is decreased contractile function involving the mid anterolateral and basal anterolateral segment(s).  4. There is grade I diastolic dysfunction (impaired relaxation).  5. Mitral valve leaflets are mildly thickened with normal leaflet mobility  6. There is mild mitral valve regurgitation.  7. The left atrium is mildly dilated in size.  8. Aortic valve is trileaflet with mildly thickened leaflets with normal excursion  9. Right ventricle is mildly dilated in size, with normal systolic function.  10. The right atrium is upper normal  in size.    Holter:  09/24/2015 Zio patch  Patient had a min HR of 54 bpm, max HR of 144 bpm, and avg HR of 76 bpm.   Predominant underlying rhythm was Sinus Rhythm. Bundle Branch   Block/IVCD was present. 1 run of Ventricular Tachycardia occurred lasting 4 beats with a max rate of 136 bpm (avg 105 bpm). 3 Supraventricular Tachycardia runs occurred, the run with   the fastest interval lasting 7 beats with a max rate of 144 bpm,   the longest lasting 13 beats with an avg rate of 128 bpm. Isolated SVEs were rare (<1.0%), SVE Couplets were rare (<1.0%), and SVE Triplets were rare (<1.0%). Isolated VEs were   frequent (10.2%, [id number]), VE Couplets were rare (<1.0%, 697), and VE Triplets were rare (<1.0%, 5). Ventricular Bigeminy and Trigeminy were present.   No patient triggered and no diary events were noted.     Cardiac CT/MRI/Nuclear Tests:  04/14/2020 MRI cardiac morph  Left ventricle is dilated (index LVEDV is 191 mL/m2) and there are severe wall motion abnormalities with reduced ejection fraction (LVEF is 19%).      There is scarring of the apical septal wall involving 50-75% thickness. Otherwise, no other areas of myocardial scarring/fibrosis can be convincingly identified.      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       09/18/2015 PET myocardial perfusion multiple  Impressions:   - Abnormal probably low risk ischemia study   - There is a very small in size, mild in severity, minimally reversible   defect involving the apical inferior segment. This is consistent with   possible artifact (misregistration artifact) or cannot rule out subtle   scar.   - There is a very small in size, subtle  in severity, completely reversible   defect involving the mid anterior segment. This is consistent with   possible normal variant but, cannot rule out subtle ischemia.   - During stress: Global systolic function is moderately reduced. The   ejection fraction calculated at 36%. The left ventricle is dilated.   - Attenuation CT scan shows post PCI findings                  Other Past Medical History:  See below for the complete EPIC list of past medical and surgical history.      Allergies:  Iodinated contrast media and Iodine     Current Medications:    Meds ordered prior to current encounter[1]    Family History:  The patient's family history includes Diabetes in his father; Heart disease in his brother and father.    Social history:  He  reports that he has quit smoking. He has never used smokeless tobacco.    Review of Systems:  As per HPI and as follows.  Rest of the review of ten systems is negative or unremarkable except as stated above.    Physical Exam:  VITAL SIGNS:   Vitals:    08/06/24 1007   BP: 123/68   Pulse: 56   SpO2: 95%      Wt Readings from Last 3 Encounters:   08/04/23 90.4 kg (199 lb 6.4 oz)   06/01/23 90.2 kg (198 lb 12.8 oz)   11/17/22 93.4 kg (205 lb 12.8 oz)      Today's Body mass index is 25.46 kg/m??.        GENERAL: well-appearing in no acute distress  HEENT: Normocephalic and atraumatic. Conjunctivae and sclerae clear and anicteric.    NECK: Supple, JVP not seen above the clavicle with HOB at 30 degrees.   CARDIOVASCULAR: Rate and rhythm are regular.  Normal S1, S2. There is no murmur, gallops or rubs  RESPIRATORY: Normal respiratory effort. Clear to auscultation bilaterally..  There are no wheezes.  ABDOMEN: Soft, non-tender, Abdomen nondistended.    EXTREMITIES:  Radial pulses are 2+, bilaterally.   There is no pedal edema, bilaterally.   SKIN: No rashes, ecchymosis or petechiae.  Warm, well perfused. Well healed left sided  Pacemaker/ICD scar   NEURO/PSYCH: Alert and oriented x 3. Affect appropriate.  Nonfocal    Pertinent Laboratory Studies:   Orders Only on 05/21/2024   Component Date Value Ref Range Status    Session datetime 05/21/2024 2024-05-21 04:48:36.0   Final    Session type 05/21/2024 Remote   Final    Manufacturer 05/21/2024 MDT   Final    Device type 05/21/2024 CRT-D   Final    Model number 05/21/2024 [id number]   Final    Serial number 05/21/2024 [id number]   Final    Implant date 05/21/2024 2020-08-04   Final    Battery remaining longevity 05/21/2024 51.0   Final    Battery voltage 05/21/2024 2.960   Final    Battery replacement trigger 05/21/2024 2.800   Final    Capacitor charge time 05/21/2024 4.100   Final    Atrial pacing percent 05/21/2024 9.30   Final    AT burden percent 05/21/2024 79.70   Final    RA intrinsic amplitude 05/21/2024 1.500   Final    RA programmed sensitivity 05/21/2024 0.45   Final    RA impedance 05/21/2024 608   Final    RA threshold amplitude 05/21/2024 0.500   Final  RA threshold pulsewidth 05/21/2024 0.4   Final    RA threshold date 05/21/2024 2024-05-21   Final    RA pacing amplitude 05/21/2024 1.500   Final    RA pacing pulsewidth 05/21/2024 0.4   Final    RV intrinsic amplitude 05/21/2024 2.800   Final    RV programmed sensitivity 05/21/2024 0.30   Final    RV impedance 05/21/2024 380   Final    RV threshold amplitude 05/21/2024 0.500   Final    RV threshold pulsewidth 05/21/2024 0.4   Final    RV threshold date 05/21/2024 2024-05-21   Final    RV pacing amplitude 05/21/2024 2.000   Final    RV pacing pulsewidth 05/21/2024 0.4   Final    LV impedance 05/21/2024 551   Final    LV threshold amplitude 05/21/2024 1.250   Final    LV threshold pulsewidth 05/21/2024 0.4   Final    LV threshold date 05/21/2024 2024-05-21   Final    LV pacing amplitude 05/21/2024 2.250   Final    LV pacing pulsewidth 05/21/2024 0.4   Final    Brady mode 05/21/2024 DDDR   Final    Paced chamber 05/21/2024 BiV   Final    Lower rate 05/21/2024 50   Final    Mode switch rate 05/21/2024 171   Final    Upper tracking rate 05/21/2024 130   Final    Upper sensor rate 05/21/2024 130   Final    Paced AV delay 05/21/2024 130   Final    Sensed AV delay 05/21/2024 100   Final    LVRV delay 05/21/2024 0   Final    Shocks delivered recent 05/21/2024 0   Final    Shocks aborted recent 05/21/2024 0   Final    ATP delivered recent 05/21/2024 0   Final    RV hv impedance 05/21/2024 54   Final    Zone type 05/21/2024 AT/AF   Final    Rate 1 05/21/2024 171   Final    Therapies 05/21/2024 All Rx Off   Final    Status 05/21/2024 Monitor   Final    Zone id 05/21/2024 2   Final    Zone type 05/21/2024 VF   Final    Rate 1 05/21/2024 188   Final    Therapies 05/21/2024 ATP During Charging, 40.0Jx6   Final    Status 05/21/2024 On   Final    Zone id 05/21/2024 3   Final    Zone type 05/21/2024 VT   Final    Status 05/21/2024 Off   Final    Zone id 05/21/2024 4   Final    Zone type 05/21/2024 VT   Final    Status 05/21/2024 Off   Final    Zone id 05/21/2024 5   Final    Zone type 05/21/2024 VT   Final    Status 05/21/2024 Off   Final    Zone id 05/21/2024 6   Final       Lab Results   Component Value Date    PRO-BNP 4,040.0 (H) 04/23/2020    PRO-BNP 4,710.0 (H) 04/10/2020    Creatinine 1.40 (H) 08/18/2022    Creatinine 1.40 (H) 08/18/2022    Creatinine 1.33 (H) 01/19/2012    Creatinine 1.19 01/18/2012    BUN 32 (H) 08/18/2022    BUN 32 (H) 08/18/2022    BUN 33 (H) 01/19/2012    BUN 29 (  H) 01/18/2012    Sodium 138 08/18/2022    Sodium 138 08/18/2022    Sodium 143 01/19/2012    Potassium 5.0 (H) 08/18/2022    Potassium 5.0 (H) 08/18/2022    Potassium 4.5 01/19/2012    CO2 26.1 08/18/2022    CO2 26.1 08/18/2022    CO2 25 01/19/2012    Magnesium 2.8 (H) 04/23/2020    Magnesium 2.2 01/18/2012    Total Bilirubin 0.4 08/18/2022    Total Bilirubin 0.5 01/18/2012    INR 1.08 04/10/2020    INR 1.0 01/18/2012       No results found for: DIGOXIN    Lab Results   Component Value Date    Cholesterol, Total 80 01/20/2022    Cholesterol, Total 189 05/17/2012    Triglycerides 87 01/20/2022    Triglycerides 215 (H) 05/17/2012    HDL 34 (L) 05/17/2012    Cholesterol, HDL 45 01/20/2022    Cholesterol, Non-HDL, Calculated 35 (L) 01/20/2022    LDL Cholesterol, Calculated 112 05/17/2012    Cholesterol, LDL, Calculated 18 (L) 01/20/2022       Lab Results   Component Value Date    WBC 8.2 08/18/2022    WBC 10.6 01/19/2012    HGB 11.5 (L) 08/18/2022    HGB 12.6 (L) 01/19/2012    HCT 33.3 (L) 08/18/2022    HCT 36.8 (L) 01/19/2012    Platelet 307 08/18/2022    Platelet 141 (L) 01/19/2012         Past Medical History[2]    Past Surgical History[3]      Disclaimer: Scientist, research (medical) software was used to create portions of this document.  An attempt at proofreading has been made to minimize errors.  If, however, the reader notes inconsistent information and/or needs to ask questions concerning any part of the document, please contact me for clarification.  Occasional interpretation errors may inadvertently occur due to limitations in the software.       Mallie Capes, FNP-C   Division of Cardiology  Cardiovascular Medicine APP             [1]   Current Outpatient Medications on File Prior to Visit   Medication Sig    ACCU-CHEK GUIDE TEST STRIPS Strp TEST BLOOD SUGAR TWICE DAILY AS INSTRUCTED    ACCU-CHEK SOFTCLIX LANCETS lancets USE AS DIRECTED TWICE DAILY    acetaminophen (TYLENOL) 500 MG tablet Take 1 tablet (500 mg total) by mouth every six (6) hours as needed for pain.    amoxicillin  (AMOXIL ) 500 MG capsule TAKE 4 CAPSULES BY MOUTH 1 HOUR PRIOR TO APPOINTMENT    aspirin (ECOTRIN) 81 MG tablet Take 1 tablet (81 mg total) by mouth daily. Takes 3 days a week    atorvastatin  (LIPITOR) 80 MG tablet Take 1 tablet (80 mg total) by mouth daily.    carvedilol  (COREG ) 25 MG tablet TAKE 1 TABLET(25 MG) BY MOUTH TWICE DAILY    empty container (SHARPS-A-GATOR DISPOSAL SYSTEM) Misc Use as directed for sharps disposal    empty container (SHARPS-A-GATOR DISPOSAL SYSTEM) Misc Use as directed for sharps disposal    ENTRESTO  97-103 mg tablet TAKE 1 TABLET TWICE DAILY    evolocumab  (REPATHA  SURECLICK) 140 mg/mL PnIj Inject the contents of 1 pen (140 mg) under the skin every fourteen (14) days.    JARDIANCE  10 mg tablet TAKE 1 TABLET EVERY DAY    metFORMIN (GLUCOPHAGE) 1000 MG tablet Take 1 tablet (1,000 mg total) by mouth in the  morning and 1 tablet (1,000 mg total) in the evening. Take with meals.    sertraline (ZOLOFT) 50 MG tablet Take 1 tablet (50 mg total) by mouth daily.     No current facility-administered medications on file prior to visit.   [2]   Past Medical History:  Diagnosis Date    CHF (congestive heart failure)        Coronary artery disease     Diabetes mellitus        Hypertension 07/12/2014    Macrocytic anemia     Mixed hyperlipidemia 07/12/2014   [3]   Past Surgical History:  Procedure Laterality Date    ABDOMINAL SURGERY      EYE SURGERY      JOINT REPLACEMENT      PR CATH PLACE/CORON ANGIO, IMG SUPER/INTERP,W LEFT HEART VENTRICULOGRAPHY N/A 04/15/2020    Procedure: CATH LEFT HEART CATHETERIZATION W INTERVENTION;  Surgeon: Donnice Beverley Chester, MD;  Location: [facility] CATH;  Service: Cardiology    PR INSJ/RPLCMT PERM DFB W/TRNSVNS LDS 1/DUAL [facility] N/A 08/04/2020    Procedure: Implant Biventricular ICD System;  Surgeon: Anil Kishin Gehi, MD;  Location: [facility] EP;  Service: Cardiology

## 2024-08-06 ENCOUNTER — Ambulatory Visit: Admit: 2024-08-06 | Discharge: 2024-08-07 | Payer: Medicare (Managed Care)

## 2024-08-06 ENCOUNTER — Encounter: Admit: 2024-08-06 | Discharge: 2024-08-07 | Payer: Medicare (Managed Care)

## 2024-08-06 DIAGNOSIS — R7989 Other specified abnormal findings of blood chemistry: Secondary | ICD-10-CM | POA: Diagnosis not present

## 2024-08-06 DIAGNOSIS — J449 Chronic obstructive pulmonary disease, unspecified: Secondary | ICD-10-CM | POA: Diagnosis not present

## 2024-08-06 DIAGNOSIS — E119 Type 2 diabetes mellitus without complications: Secondary | ICD-10-CM | POA: Diagnosis not present

## 2024-08-06 DIAGNOSIS — I493 Ventricular premature depolarization: Secondary | ICD-10-CM | POA: Diagnosis not present

## 2024-08-06 DIAGNOSIS — R0600 Dyspnea, unspecified: Secondary | ICD-10-CM | POA: Diagnosis not present

## 2024-08-06 DIAGNOSIS — Z79899 Other long term (current) drug therapy: Secondary | ICD-10-CM | POA: Diagnosis not present

## 2024-08-06 DIAGNOSIS — I255 Ischemic cardiomyopathy: Secondary | ICD-10-CM | POA: Diagnosis not present

## 2024-08-06 DIAGNOSIS — I110 Hypertensive heart disease with heart failure: Secondary | ICD-10-CM | POA: Diagnosis not present

## 2024-08-06 DIAGNOSIS — I5042 Chronic combined systolic (congestive) and diastolic (congestive) heart failure: Secondary | ICD-10-CM | POA: Diagnosis not present

## 2024-08-06 DIAGNOSIS — E782 Mixed hyperlipidemia: Secondary | ICD-10-CM | POA: Diagnosis not present

## 2024-08-06 DIAGNOSIS — J810 Acute pulmonary edema: Secondary | ICD-10-CM | POA: Diagnosis not present

## 2024-08-06 DIAGNOSIS — Z4502 Encounter for adjustment and management of automatic implantable cardiac defibrillator: Secondary | ICD-10-CM | POA: Diagnosis not present

## 2024-08-06 DIAGNOSIS — Z87891 Personal history of nicotine dependence: Secondary | ICD-10-CM | POA: Diagnosis not present

## 2024-08-06 LAB — PRO-BNP: PRO-BNP: 1417 pg/mL — ABNORMAL HIGH (ref <=300.0)

## 2024-08-06 NOTE — Unmapped (Addendum)
 We will do a remote check of your device in 2 weeks time to assess if your Biv pacing has improved.   I will be in touch with you regarding your Pro-BNP.

## 2024-08-07 DIAGNOSIS — E538 Deficiency of other specified B group vitamins: Secondary | ICD-10-CM | POA: Diagnosis not present

## 2024-08-07 MED ORDER — CARVEDILOL 25 MG TABLET
ORAL_TABLET | Freq: Two times a day (BID) | ORAL | 1 refills | 90.00000 days | Status: CP
Start: 2024-08-07 — End: ?

## 2024-08-07 MED ORDER — FUROSEMIDE 20 MG TABLET
ORAL_TABLET | Freq: Every day | ORAL | 0 refills | 5.00000 days | Status: CP
Start: 2024-08-07 — End: 2024-08-12

## 2024-08-07 NOTE — Unmapped (Signed)
 Refill request received for patient.      Medication Requested: Carvedilol   Last Office Visit: Visit date not found   Next Office Visit: 09/12/2024  Last Prescriber: Barnet    Nurse refill requirements met? Yes  If not met, why:     Sent to: Provider per protocol  If sent to provider, which provider?: [facility]

## 2024-08-20 ENCOUNTER — Ambulatory Visit: Admit: 2024-08-20 | Discharge: 2024-08-21 | Payer: Medicare (Managed Care)

## 2024-08-23 ENCOUNTER — Ambulatory Visit: Admit: 2024-08-23 | Discharge: 2024-08-24 | Payer: Medicare (Managed Care)

## 2024-08-23 DIAGNOSIS — Z4502 Encounter for adjustment and management of automatic implantable cardiac defibrillator: Secondary | ICD-10-CM | POA: Diagnosis not present

## 2024-08-23 DIAGNOSIS — I5042 Chronic combined systolic (congestive) and diastolic (congestive) heart failure: Secondary | ICD-10-CM | POA: Diagnosis not present

## 2024-08-23 NOTE — Unmapped (Signed)
 [facility] Heart Failure Monitoring Device Report    Patient:  Douglas Beltran  Date:  August 23, 2024    Device:   Medtronic - Optivol    [facility] HF cardiologist:   none   [place] EP cardiologist:   Dr. Shirlyn Adjutant   Other cardiology provider:  Dr. Donnice Chester     PCP:  Auston Reyes Lenis, MD      30 day Report:  There were no readings that exceeded the thresholds.    Optivol device detected intrathoracic impedance that was at baseline, suggesting optimal fluid status.      Please see full interrogation report under Media tab for this encounter.     I reviewed the device-generated report and agree with findings.      Recommendation:  Continue monthly monitoring.      Phillp Dolores B Agostino Gorin, ANP

## 2024-08-24 MED FILL — REPATHA SURECLICK 140 MG/ML SUBCUTANEOUS PEN INJECTOR: SUBCUTANEOUS | 84 days supply | Qty: 6 | Fill #2

## 2024-08-30 ENCOUNTER — Encounter
Admit: 2024-08-30 | Discharge: 2024-08-30 | Payer: Medicare (Managed Care) | Attending: Clinical Cardiac Electrophysiology | Primary: Clinical Cardiac Electrophysiology

## 2024-08-30 DIAGNOSIS — I255 Ischemic cardiomyopathy: Secondary | ICD-10-CM | POA: Diagnosis not present

## 2024-08-30 DIAGNOSIS — Z95810 Presence of automatic (implantable) cardiac defibrillator: Secondary | ICD-10-CM | POA: Diagnosis not present

## 2024-09-09 NOTE — Unmapped (Signed)
 DIVISION OF CARDIOLOGY   [employer] , Genetta Potters        Date of Service: 01/20/22    Referring Provider: Auston Reyes Lenis, *   Auston, Reyes Lenis, MD  [address]  [facility] [place],  [place] [address]   Primary Provider: Auston Reyes Lenis, MD   [address] [facility] [place] [address]     Reason for Visit:  Douglas Beltran is a 83 y.o. male with history of severe three-vessel CAD, type 2 diabetes, HLD, MDD referred by Auston Reyes Lenis, * for management of systolic and diastolic heart failure.    Assessment & Plan:    1.  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  Was admitted to med D in May 2022 with NSTEMI found to have worsening HFrEF with EF of 15-20%, grade 2 diastolic dysfunction on echo on 06/18/2020. 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    - Continue entresto  97-103 mg  - Continue carvedilol  25 mg bid.  - Continue Jardiance  10 mg daily.  - Discontinued spironolactone  due to low BP and syncope episodes at last visit. May restart in the future.   - Has not taken Lasix  in two years.   - Most recent echo from today shows that his LVEF is approximately 30% and overall unchanged from prior echocardiograms.   - He continues to walk with some SOB but is able to continue out daily activities without limitations.     2. Coronary artery disease: Diagnosed with multivessel disease in 2013 with subsequent PCI. NSTEMI with PCI to proximal LAD and mid RCA in May 2021.  He has had some discomfort which sounds to be likely more acid reflux given its relationship to food and lack of relationship to exertion.  -Obtain PET stress to evaluate for ischemia given known left main disease  -Continue aspirin and atorvastatin  80 mg  -- Continue evolocumab   -- LDL of 26 on 06/26/24    3. HTN  - BP today is controlled  - Advised him when he stands to take a moment to stand still before continuing to move, and when lightheaded he should take a moment to sit. I encouraged him to wear compression socks.    4.  Hyperlipidemia  - LDL 26 on 7/25  - Continue atorvastatin  80 mg and evolocumab     No follow-ups on file.       History of Present Illness:  Douglas Beltran with history of severe three-vessel CAD, type 2 diabetes, HLD, MDD is being seen at the [facility]  for follow-up of his coronary artery disease.     The patient's cardiac history started 2013 when he presented to [place] with shortness of breath and chest tightness was found to have extensive CAD.  At the time was referred for CABG, but patient opted for PCI with placement of stents to 80% proximal mid LAD lesion, 70% mid RCA lesion, and 80% distal RCA stenosis.  At approximately the same time he was diagnosed with ischemic cardiomyopathy, at the time EF 30-35%.  In May 2021, while walking around the mall, had significant shortness of breath.  Over the following days his shortness of breath became progressively worse in addition to the start of chest tightness and pressure which prompted him to go to the urgent care, and subsequently sent to the  emergency room at [facility].  At that time he underwent left and right heart cath which showed severe three-vessel disease, and an echo done at that time showed an EF of 15-20%.  He was subsequently referred to [facility] for CABG eval versus complex PCI. Patient had cardiac MRI done on 04/14/20 which showed a severe wall motion abnormalities, EF approximately 19%, and significant scarring of apical septal wall but otherwise had viable cardiac tissue, but was deemed to high risk for CABG.  He underwent PCI with 2 DES placed to LM/LAD and RCA.  Since his hospitalization, patient has received a BiV ICD.  Otherwise, has had no hospitalizations for heart failure.     Douglas Beltran presents today for a follow-up in clinic. He is continuing [place] walk at the mall but has shortened. He used to walk 4 laps (~ 2 miles) at the mall but now only does 2 laps (~1 mile) because he gets short of breath. He does get short of breath more easily. He has been falling lately. He fell last week while spreading mulch. He has no PND, orthopnea/LEE. He does have some occasional chest discomfort. The last was 4-5 days ago while sitting. The discomfort occurs a couple of times a week and lasts a few minutes. He has not had the episodes while walking.  He does feel the discomfort may be related to food    Cardiovascular History & Procedures:  Cath / PCI:  PCI 04/15/20:  PCI was performed of the 99% heavily calcificed Left Main and LAD. Rotational atherectomy was performed using a 1.75 mm burr. PCI was performed with a 4.0x38 Xience (LM-LAD).      PCI was performed of the 90% heavily calcificed RCA. Rotational atherectomy was performed using a 1.75 mm burr. PCI was performed with a 4.0x38 and 3.5x30 with rota (post to 4.5).      Excellent angiographic result with TIMI 3 flow.     CV Surgery: None    EP Procedures and Devices:  BiV ICD placed 08/04/20    Non-Invasive Evaluation(s):  Echo:  07/12/22    1. The left ventricle is normal in size with normal wall thickness.    2. The left ventricular systolic function is moderately decreased, LVEF is  visually estimated at 25%. Overall it is unchanged from prior echoes.    3. There is decreased contractile function involving the mid anterolateral  and basal anterolateral segment(s).    4. There is grade I diastolic dysfunction (impaired relaxation).    5. The aortic valve is trileaflet with mildly thickened leaflets with normal  excursion.    6. The left atrium is mildly dilated in size.    7. The right ventricle is normal in size, with normal systolic function.    8. The right atrium is mildly dilated  in size.    04/03/21:    1. The left ventricle is normal in size with normal wall thickness.    2. The left ventricular systolic function is severely decreased, LVEF is  visually estimated at 25%.    3. There is decreased contractile function involving the mid anterolateral  and basal anterolateral segment(s).    4. There is grade I diastolic dysfunction (impaired relaxation).    5. The mitral valve leaflets are mildly thickened with normal leaflet  mobility.    6. There is mild mitral valve regurgitation.    7. The left atrium is mildly dilated in size.    8. The aortic valve is trileaflet  with mildly thickened leaflets with normal  excursion.    9. The right ventricle is mildly dilated in size, with normal systolic  function.    10. The right atrium is upper normal  in size.    06/18/20:  Summary    1. Limited study to assess ventricular function.    2. The left ventricle is mildly dilated in size with mildly increased wall  thickness.    3. The left ventricular systolic function is severely decreased, LVEF is  visually estimated at 15-20%.    4. The inferior wall, basal inferolateral wall, and mid inferolateral wall  are akinetic.    5. There is grade II diastolic dysfunction (elevated filling pressure).    6. The left atrium is mildly dilated in size.    7. The aortic valve is probably trileaflet with mildly thickened leaflets  with normal excursion.    8. The right ventricle is mildly dilated in size, with normal systolic  function.    CT/MRI/Nuclear Tests:  Cardiac MRI 04/14/20:    Left ventricle is dilated (index LVEDV is 191 mL/m2) and there are severe wall motion abnormalities with reduced ejection fraction (LVEF is 19%).     There is scarring of the apical septal wall involving 50-75% thickness. Otherwise, no other areas of myocardial scarring/fibrosis can be convincingly identified.     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     Past Medical History:   Diagnosis Date    CHF (congestive heart failure)    (CMS-HCC)     Coronary artery disease     Diabetes mellitus    (CMS-HCC)     Hypertension 07/12/2014    Macrocytic anemia     Mixed hyperlipidemia 07/12/2014        Past Surgical History:   Procedure Laterality Date    ABDOMINAL SURGERY      EYE SURGERY      JOINT REPLACEMENT      PR CATH PLACE/CORON ANGIO, IMG SUPER/INTERP,W LEFT HEART VENTRICULOGRAPHY N/A 04/15/2020    Procedure: CATH LEFT HEART CATHETERIZATION W INTERVENTION;  Surgeon: Donnice Beverley [place], MD;  Location: [facility] CATH;  Service: Cardiology    PR INSJ/RPLCMT PERM DFB W/TRNSVNS LDS 1/DUAL [facility] N/A 08/04/2020    Procedure: Implant Biventricular ICD System;  Surgeon: Anil Kishin Gehi, MD;  Location: [facility] EP;  Service: Cardiology         Allergies:  Iodinated contrast media and Iodine     Current Medications:  Current Outpatient Medications   Medication Sig Dispense Refill    ACCU-CHEK GUIDE TEST STRIPS Strp TEST BLOOD SUGAR TWICE DAILY AS INSTRUCTED      ACCU-CHEK SOFTCLIX LANCETS lancets USE AS DIRECTED TWICE DAILY      acetaminophen (TYLENOL) 500 MG tablet Take 1 tablet (500 mg total) by mouth every six (6) hours as needed for pain.      amoxicillin  (AMOXIL ) 500 MG capsule TAKE 4 CAPSULES BY MOUTH 1 HOUR PRIOR TO APPOINTMENT      aspirin (ECOTRIN) 81 MG tablet Take 1 tablet (81 mg total) by mouth daily. Takes 3 days a week      atorvastatin  (LIPITOR) 80 MG tablet Take 1 tablet (80 mg total) by mouth daily. 90 tablet 3    carvedilol  (COREG ) 25 MG tablet TAKE 1 TABLET(25 MG) BY MOUTH TWICE DAILY 180 tablet 1    empty container (SHARPS-A-GATOR DISPOSAL SYSTEM) Misc Use as directed for sharps disposal 1 each 2    empty container (  SHARPS-A-GATOR DISPOSAL SYSTEM) Misc Use as directed for sharps disposal 1 each 2    ENTRESTO  97-103 mg tablet TAKE 1 TABLET TWICE DAILY 60 tablet 11    evolocumab  (REPATHA  SURECLICK) 140 mg/mL PnIj Inject the contents of 1 pen (140 mg) under the skin every fourteen (14) days. 4 mL 11    furosemide  (LASIX ) 20 MG tablet Take 1 tablet (20 mg total) by mouth daily for 5 days. 5 tablet 0    JARDIANCE  10 mg tablet TAKE 1 TABLET EVERY DAY 30 tablet 11    metFORMIN (GLUCOPHAGE) 1000 MG tablet Take 1 tablet (1,000 mg total) by mouth in the morning and 1 tablet (1,000 mg total) in the evening. Take with meals.      sertraline (ZOLOFT) 50 MG tablet Take 1 tablet (50 mg total) by mouth daily.       No current facility-administered medications for this visit.       Family History:  There is no family history of premature coronary artery disease or sudden cardiac death.    Social history:  Former smoker, quit 31 years ago    Review of Systems:  A full review of 10 systems is unremarkable except as stated in the HPI.     Physical Exam:  VITAL SIGNS:     There were no vitals filed for this visit.         Wt Readings from Last 3 Encounters:   08/06/24 90 kg (198 lb 6.4 oz)   08/04/23 90.4 kg (199 lb 6.4 oz)   06/01/23 90.2 kg (198 lb 12.8 oz)        GENERAL: Well-appearing in no acute distress  HEENT: Normocephalic and atraumatic with anicteric sclerae    NECK: Supple, without lymphadenopathy or thyromegaly. JVP not elevated. There are no carotid bruits  CARDIOVASCULAR: RRR, S1/S2 heard, no S3, no murmurs or gallops  RESPIRATORY: Clear to auscultation without wheezes or crackles.   ABDOMEN: Soft, non-tender, non-distended with audible bowel sounds. There is no organomegaly or palpable pulsatile mass.   EXTREMITIES:  Lower extremities are warm and without edema. Distal pulses are full and symmetric.  SKIN: No rashes, ecchymosis or petechiae.  NEURO: Alert, pleasant, and appropriate. Non-focal neuro exam    Pertinent Laboratory Studies:   Lab Results   Component Value Date    PRO-BNP 1,417.0 (H) 08/06/2024    PRO-BNP 4,040.0 (H) 04/23/2020    PRO-BNP 4,710.0 (H) 04/10/2020    Creatinine 1.40 (H) 08/18/2022    Creatinine 1.40 (H) 08/18/2022    Creatinine 1.33 (H) 01/19/2012    Creatinine 1.19 01/18/2012    BUN 32 (H) 08/18/2022    BUN 32 (H) 08/18/2022    BUN 33 (H) 01/19/2012    BUN 29 (H) 01/18/2012    Potassium 5.0 (H) 08/18/2022    Potassium 5.0 (H) 08/18/2022    Potassium 4.5 01/19/2012    Potassium 4.7 01/18/2012    Magnesium 2.8 (H) 04/23/2020    Magnesium 2.5 (H) 04/16/2020    Magnesium 2.2 01/18/2012    AST 69 (H) 08/18/2022    AST 33 04/25/2020    AST 28 01/18/2012    ALT 94 (H) 08/18/2022    ALT 31 04/25/2020    ALT 29 01/18/2012    Total Bilirubin 0.4 08/18/2022    Total Bilirubin 0.9 04/25/2020    Total Bilirubin 0.5 01/18/2012    INR 1.08 04/10/2020    INR 1.0 01/18/2012    WBC 8.2 08/18/2022  WBC 10.6 01/19/2012    HGB 11.5 (L) 08/18/2022    HGB 12.6 (L) 01/19/2012    HCT 33.3 (L) 08/18/2022    HCT 36.8 (L) 01/19/2012    Platelet 307 08/18/2022    Platelet 141 (L) 01/19/2012    Triglycerides 87 01/20/2022    Triglycerides 215 (H) 05/17/2012    HDL 34 (L) 05/17/2012    Cholesterol, HDL 45 01/20/2022    Cholesterol, Non-HDL, Calculated 35 (L) 01/20/2022    LDL Cholesterol, Calculated 112 05/17/2012    Cholesterol, LDL, Calculated 18 (L) 01/20/2022       Dictated using Dragon software, please excuse typos.    Scribe Attestation:         This document serves as a record of the services and decisions performed by Donnice A. Barnet, MD on 09/12/2024. It was created on his behalf by Hannah M Ellyson, a trained medical scribe. The creation of this document is based on the provider's statements and observations that were conveyed to the medical scribe during the patient's encounter.     (The information in this document, created by the medical scribe for me, accurately reflects the services I personally performed and the decisions made by me. I have reviewed and approved this document for accuracy.)    I personally spent 35 minutes face-to-face and non-face-to-face in the care of this patient, which includes all pre, intra, and post visit time on the date of service.      Donnice FELIX Barnet, MD, MPH  Associate Professor of Medicine  [employer]  at [facility]  [email or web address]   -    Office [phone]

## 2024-09-12 ENCOUNTER — Ambulatory Visit: Admit: 2024-09-12 | Discharge: 2024-09-13 | Payer: Medicare (Managed Care)

## 2024-09-12 ENCOUNTER — Inpatient Hospital Stay: Admit: 2024-09-12 | Discharge: 2024-09-13 | Payer: Medicare (Managed Care)

## 2024-09-12 DIAGNOSIS — I252 Old myocardial infarction: Secondary | ICD-10-CM | POA: Diagnosis not present

## 2024-09-12 DIAGNOSIS — I255 Ischemic cardiomyopathy: Secondary | ICD-10-CM | POA: Diagnosis not present

## 2024-09-12 DIAGNOSIS — I2510 Atherosclerotic heart disease of native coronary artery without angina pectoris: Secondary | ICD-10-CM | POA: Diagnosis not present

## 2024-09-12 DIAGNOSIS — I209 Angina pectoris, unspecified: Secondary | ICD-10-CM | POA: Diagnosis not present

## 2024-09-12 DIAGNOSIS — I340 Nonrheumatic mitral (valve) insufficiency: Secondary | ICD-10-CM | POA: Diagnosis not present

## 2024-09-12 DIAGNOSIS — I080 Rheumatic disorders of both mitral and aortic valves: Secondary | ICD-10-CM | POA: Diagnosis not present

## 2024-09-12 DIAGNOSIS — I5022 Chronic systolic (congestive) heart failure: Secondary | ICD-10-CM | POA: Diagnosis not present

## 2024-09-12 DIAGNOSIS — E782 Mixed hyperlipidemia: Secondary | ICD-10-CM | POA: Diagnosis not present

## 2024-09-12 DIAGNOSIS — I10 Essential (primary) hypertension: Secondary | ICD-10-CM | POA: Diagnosis not present

## 2024-09-12 DIAGNOSIS — Z950 Presence of cardiac pacemaker: Secondary | ICD-10-CM | POA: Diagnosis not present

## 2024-09-12 DIAGNOSIS — I214 Non-ST elevation (NSTEMI) myocardial infarction: Principal | ICD-10-CM

## 2024-09-24 ENCOUNTER — Ambulatory Visit: Admit: 2024-09-24 | Discharge: 2024-09-25 | Payer: Medicare (Managed Care)

## 2024-09-24 DIAGNOSIS — Z4502 Encounter for adjustment and management of automatic implantable cardiac defibrillator: Secondary | ICD-10-CM | POA: Diagnosis not present

## 2024-09-24 DIAGNOSIS — I5042 Chronic combined systolic (congestive) and diastolic (congestive) heart failure: Secondary | ICD-10-CM | POA: Diagnosis not present

## 2024-09-26 NOTE — Unmapped (Signed)
 [place] Heart Failure Monitoring Device Report    Patient:  Douglas Beltran  Date:  September 26, 2024    Device:   Medtronic - Optivol    [facility] HF cardiologist:   none   [place] EP cardiologist:   [facility]   Other cardiology provider:  Dr. Donnice Chester     PCP:  Auston Reyes Lenis, MD      30 day Report:  There were no readings that exceeded the thresholds.    Optivol device detected intrathoracic impedance that was at baseline, suggesting optimal fluid status.      Please see full interrogation report under Media tab for this encounter.     I reviewed the device-generated report and agree with findings.      Recommendation:  Continue monthly monitoring.      Damien MARLA Lee, AGNP

## 2024-10-25 ENCOUNTER — Ambulatory Visit: Admit: 2024-10-25 | Discharge: 2024-10-26 | Payer: Medicare (Managed Care)

## 2024-10-25 DIAGNOSIS — Z4502 Encounter for adjustment and management of automatic implantable cardiac defibrillator: Secondary | ICD-10-CM | POA: Diagnosis not present

## 2024-10-25 DIAGNOSIS — I5042 Chronic combined systolic (congestive) and diastolic (congestive) heart failure: Secondary | ICD-10-CM | POA: Diagnosis not present

## 2024-10-29 DIAGNOSIS — R2689 Other abnormalities of gait and mobility: Secondary | ICD-10-CM | POA: Diagnosis not present

## 2024-10-29 DIAGNOSIS — N1832 Chronic kidney disease, stage 3b: Secondary | ICD-10-CM | POA: Diagnosis not present

## 2024-10-29 DIAGNOSIS — Z1331 Encounter for screening for depression: Secondary | ICD-10-CM | POA: Diagnosis not present

## 2024-10-29 DIAGNOSIS — I255 Ischemic cardiomyopathy: Secondary | ICD-10-CM | POA: Diagnosis not present

## 2024-10-29 DIAGNOSIS — E538 Deficiency of other specified B group vitamins: Secondary | ICD-10-CM | POA: Diagnosis not present

## 2024-10-29 DIAGNOSIS — I110 Hypertensive heart disease with heart failure: Secondary | ICD-10-CM | POA: Diagnosis not present

## 2024-10-29 DIAGNOSIS — Z0000 Encounter for general adult medical examination without abnormal findings: Secondary | ICD-10-CM | POA: Diagnosis not present

## 2024-10-29 DIAGNOSIS — I5022 Chronic systolic (congestive) heart failure: Secondary | ICD-10-CM | POA: Diagnosis not present

## 2024-10-29 DIAGNOSIS — E782 Mixed hyperlipidemia: Secondary | ICD-10-CM | POA: Diagnosis not present

## 2024-10-29 DIAGNOSIS — Z79899 Other long term (current) drug therapy: Secondary | ICD-10-CM | POA: Diagnosis not present

## 2024-10-29 DIAGNOSIS — I10 Essential (primary) hypertension: Secondary | ICD-10-CM | POA: Diagnosis not present

## 2024-10-29 DIAGNOSIS — E119 Type 2 diabetes mellitus without complications: Secondary | ICD-10-CM | POA: Diagnosis not present

## 2024-10-29 DIAGNOSIS — E118 Type 2 diabetes mellitus with unspecified complications: Secondary | ICD-10-CM | POA: Diagnosis not present

## 2024-10-30 NOTE — Progress Notes (Signed)
 [place] Heart Failure Monitoring Device Report    Patient:  Douglas Beltran  Date:  October 30, 2024    Device:   Medtronic - Optivol    [facility] HF cardiologist:   none    EP cardiologist:   [facility]   Other cardiology provider:  Dr. Donnice [place]     PCP:  Auston Reyes Lenis, MD      30 day Report:  There were no readings that exceeded the thresholds.    Optivol device detected intrathoracic impedance that was at baseline, suggesting optimal fluid status.      Please see full interrogation report under Media tab for this encounter.     I reviewed the device-generated report and agree with findings.      Recommendation:  Continue monthly monitoring.      Damien MARLA Lee, AGNP

## 2024-11-02 MED FILL — REPATHA SURECLICK 140 MG/ML SUBCUTANEOUS PEN INJECTOR: SUBCUTANEOUS | 84 days supply | Qty: 6 | Fill #3

## 2024-11-26 ENCOUNTER — Ambulatory Visit: Admit: 2024-11-26 | Discharge: 2024-11-27 | Payer: Medicare (Managed Care)

## 2024-11-27 NOTE — Progress Notes (Signed)
 [place] Heart Failure Monitoring Device Report    Patient:  Douglas Beltran  Date:  November 27, 2024    Device:   Medtronic - Optivol    [facility] HF cardiologist:   none   [place] EP cardiologist:   [facility]   Other cardiology provider:  Dr. Donnice [place]     PCP:  Auston Reyes Lenis, MD      30 day Report:  There were no readings that exceeded the thresholds.    Optivol device detected intrathoracic impedance that was at baseline, suggesting optimal fluid status.      Please see full interrogation report under Media tab for this encounter.     I reviewed the device-generated report and agree with findings.      Recommendation:  Continue monthly monitoring.      Damien MARLA Lee, AGNP

## 2024-11-29 ENCOUNTER — Encounter
Admit: 2024-11-29 | Discharge: 2024-11-29 | Payer: Medicare (Managed Care) | Attending: Clinical Cardiac Electrophysiology | Primary: Clinical Cardiac Electrophysiology

## 2024-12-25 DIAGNOSIS — I5022 Chronic systolic (congestive) heart failure: Principal | ICD-10-CM

## 2024-12-25 MED ORDER — EMPAGLIFLOZIN 10 MG TABLET
ORAL_TABLET | Freq: Every day | ORAL | 2 refills | 90.00000 days | Status: CP
Start: 2024-12-25 — End: 2025-09-21

## 2024-12-25 MED ORDER — SACUBITRIL 97 MG-VALSARTAN 103 MG TABLET
ORAL_TABLET | Freq: Two times a day (BID) | ORAL | 2 refills | 90.00000 days | Status: CP
Start: 2024-12-25 — End: 2025-09-21

## 2024-12-25 NOTE — Telephone Encounter (Signed)
 Refill request received for patient.      Medication Requested: Jardiance  and Entresto    Last Office Visit: 09/12/2024   Next Office Visit: 03/13/2025  Last Prescriber: Barnet     Nurse refill requirements met? Yes  If not met, why:     Sent to: Pharmacy per protocol  If sent to provider, which provider?:

## 2024-12-27 ENCOUNTER — Ambulatory Visit: Admit: 2024-12-27 | Discharge: 2024-12-28 | Payer: Medicare (Managed Care)

## 2024-12-28 NOTE — Progress Notes (Signed)
 [place] Heart Failure Monitoring Device Report    Patient:  Douglas Beltran  Date:  December 28, 2024    Device:   Medtronic - Douglas Beltran HF cardiologist:   none   [place] EP cardiologist:   [facility]   Other cardiology provider:  Dr. Donnice Beltran     PCP:  Douglas Reyes Lenis, MD      30 day Report:  There were no readings that exceeded the thresholds.    Optivol device detected intrathoracic impedance that was at baseline, suggesting optimal fluid status.      Please see full interrogation report under Media tab for this encounter.     I reviewed the device-generated report and agree with findings.      Recommendation:  Continue monthly monitoring.      Douglas Beltran, AGNP
